# Patient Record
Sex: Male | Born: 1940
Health system: Southern US, Community
[De-identification: ages and names within clinical notes are randomized; demographics above are authoritative.]

## PROBLEM LIST (undated history)

## (undated) DIAGNOSIS — I10 Essential (primary) hypertension: Secondary | ICD-10-CM

## (undated) DIAGNOSIS — I5022 Chronic systolic (congestive) heart failure: Secondary | ICD-10-CM

## (undated) DIAGNOSIS — I471 Supraventricular tachycardia, unspecified: Secondary | ICD-10-CM

## (undated) DIAGNOSIS — D693 Immune thrombocytopenic purpura: Secondary | ICD-10-CM

## (undated) DIAGNOSIS — I4719 Other supraventricular tachycardia: Secondary | ICD-10-CM

## (undated) DIAGNOSIS — K449 Diaphragmatic hernia without obstruction or gangrene: Secondary | ICD-10-CM

## (undated) DIAGNOSIS — I351 Nonrheumatic aortic (valve) insufficiency: Secondary | ICD-10-CM

## (undated) DIAGNOSIS — I493 Ventricular premature depolarization: Secondary | ICD-10-CM

## (undated) DIAGNOSIS — K5730 Diverticulosis of large intestine without perforation or abscess without bleeding: Secondary | ICD-10-CM

## (undated) DIAGNOSIS — I491 Atrial premature depolarization: Secondary | ICD-10-CM

## (undated) DIAGNOSIS — I4729 Other ventricular tachycardia: Secondary | ICD-10-CM

## (undated) DIAGNOSIS — I2510 Atherosclerotic heart disease of native coronary artery without angina pectoris: Secondary | ICD-10-CM

## (undated) DIAGNOSIS — I340 Nonrheumatic mitral (valve) insufficiency: Secondary | ICD-10-CM

## (undated) HISTORY — PX: CORONARY ARTERY BYPASS GRAFT: SHX141

## (undated) HISTORY — PX: CORONARY STENT PLACEMENT: SHX1402

## (undated) HISTORY — PX: CORONARY ANGIOPLASTY: SHX604

## (undated) HISTORY — PX: HERNIA REPAIR: SHX51

## (undated) HISTORY — DX: Atherosclerotic heart disease of native coronary artery without angina pectoris: I25.10

## (undated) HISTORY — DX: Diaphragmatic hernia without obstruction or gangrene: K44.9

## (undated) HISTORY — PX: SAPHENOUS VEIN GRAFT RESECTION: SHX2374

## (undated) HISTORY — PX: CORONARY ANGIOPLASTY WITH STENT PLACEMENT: SHX49

## (undated) HISTORY — DX: Immune thrombocytopenic purpura: D69.3

## (undated) HISTORY — PX: CARDIAC CATHETERIZATION: SHX172

## (undated) HISTORY — DX: Diverticulosis of large intestine without perforation or abscess without bleeding: K57.30

## (undated) HISTORY — DX: Essential (primary) hypertension: I10

---

## 2002-03-22 ENCOUNTER — Ambulatory Visit (HOSPITAL_COMMUNITY): Admission: RE | Admit: 2002-03-22 | Discharge: 2002-03-22 | Payer: Self-pay | Admitting: Cardiovascular Disease

## 2002-04-03 ENCOUNTER — Encounter: Payer: Self-pay | Admitting: Surgery

## 2002-04-05 ENCOUNTER — Inpatient Hospital Stay (HOSPITAL_COMMUNITY): Admission: RE | Admit: 2002-04-05 | Discharge: 2002-04-10 | Payer: Self-pay | Admitting: Surgery

## 2002-04-05 ENCOUNTER — Encounter: Payer: Self-pay | Admitting: Surgery

## 2002-04-06 ENCOUNTER — Encounter: Payer: Self-pay | Admitting: Surgery

## 2002-04-07 ENCOUNTER — Encounter: Payer: Self-pay | Admitting: Surgery

## 2006-06-02 ENCOUNTER — Inpatient Hospital Stay (HOSPITAL_COMMUNITY): Admission: EM | Admit: 2006-06-02 | Discharge: 2006-06-07 | Payer: Self-pay | Admitting: Cardiovascular Disease

## 2006-06-02 ENCOUNTER — Encounter: Payer: Self-pay | Admitting: Cardiovascular Disease

## 2007-02-08 HISTORY — PX: US ECHOCARDIOGRAPHY: HXRAD669

## 2007-11-30 ENCOUNTER — Inpatient Hospital Stay (HOSPITAL_BASED_OUTPATIENT_CLINIC_OR_DEPARTMENT_OTHER): Admission: RE | Admit: 2007-11-30 | Discharge: 2007-11-30 | Payer: Self-pay | Admitting: Cardiovascular Disease

## 2009-07-13 DIAGNOSIS — K449 Diaphragmatic hernia without obstruction or gangrene: Secondary | ICD-10-CM

## 2009-07-13 DIAGNOSIS — K5730 Diverticulosis of large intestine without perforation or abscess without bleeding: Secondary | ICD-10-CM

## 2009-07-13 HISTORY — DX: Diverticulosis of large intestine without perforation or abscess without bleeding: K57.30

## 2009-07-13 HISTORY — DX: Diaphragmatic hernia without obstruction or gangrene: K44.9

## 2009-12-27 ENCOUNTER — Encounter: Payer: Self-pay | Admitting: Internal Medicine

## 2010-01-03 ENCOUNTER — Encounter: Payer: Self-pay | Admitting: Internal Medicine

## 2010-01-06 ENCOUNTER — Encounter (INDEPENDENT_AMBULATORY_CARE_PROVIDER_SITE_OTHER): Payer: Self-pay | Admitting: *Deleted

## 2010-02-12 ENCOUNTER — Ambulatory Visit: Payer: Self-pay | Admitting: Internal Medicine

## 2010-02-12 DIAGNOSIS — I2510 Atherosclerotic heart disease of native coronary artery without angina pectoris: Secondary | ICD-10-CM

## 2010-02-12 DIAGNOSIS — R634 Abnormal weight loss: Secondary | ICD-10-CM | POA: Insufficient documentation

## 2010-02-12 DIAGNOSIS — E1165 Type 2 diabetes mellitus with hyperglycemia: Secondary | ICD-10-CM

## 2010-02-12 DIAGNOSIS — R7401 Elevation of levels of liver transaminase levels: Secondary | ICD-10-CM | POA: Insufficient documentation

## 2010-02-12 DIAGNOSIS — R740 Nonspecific elevation of levels of transaminase and lactic acid dehydrogenase [LDH]: Secondary | ICD-10-CM

## 2010-02-12 DIAGNOSIS — E118 Type 2 diabetes mellitus with unspecified complications: Secondary | ICD-10-CM

## 2010-02-12 DIAGNOSIS — I25118 Atherosclerotic heart disease of native coronary artery with other forms of angina pectoris: Secondary | ICD-10-CM | POA: Insufficient documentation

## 2010-02-12 HISTORY — DX: Atherosclerotic heart disease of native coronary artery without angina pectoris: I25.10

## 2010-02-12 HISTORY — DX: Elevation of levels of liver transaminase levels: R74.01

## 2010-02-12 LAB — CONVERTED CEMR LAB
A-1 Antitrypsin, Ser: 170 mg/dL (ref 83–200)
Anti Nuclear Antibody(ANA): NEGATIVE
Ceruloplasmin: 33 mg/dL (ref 21–63)
HCV Ab: NEGATIVE
Hep B S Ab: NEGATIVE
Hepatitis B Surface Ag: NEGATIVE

## 2010-02-14 DIAGNOSIS — D61818 Other pancytopenia: Secondary | ICD-10-CM

## 2010-02-14 LAB — CONVERTED CEMR LAB
ALT: 62 units/L — ABNORMAL HIGH (ref 0–53)
AST: 68 units/L — ABNORMAL HIGH (ref 0–37)
Albumin: 2.8 g/dL — ABNORMAL LOW (ref 3.5–5.2)
Alkaline Phosphatase: 193 units/L — ABNORMAL HIGH (ref 39–117)
BUN: 12 mg/dL (ref 6–23)
Basophils Absolute: 0 10*3/uL (ref 0.0–0.1)
Basophils Relative: 0.4 % (ref 0.0–3.0)
Bilirubin Urine: NEGATIVE
Bilirubin, Direct: 0.5 mg/dL — ABNORMAL HIGH (ref 0.0–0.3)
CO2: 29 meq/L (ref 19–32)
Calcium: 8.3 mg/dL — ABNORMAL LOW (ref 8.4–10.5)
Chloride: 102 meq/L (ref 96–112)
Creatinine, Ser: 0.6 mg/dL (ref 0.4–1.5)
Eosinophils Absolute: 0.1 10*3/uL (ref 0.0–0.7)
Eosinophils Relative: 2.5 % (ref 0.0–5.0)
Ferritin: 114.1 ng/mL (ref 22.0–322.0)
Folate: 13.6 ng/mL
GFR calc non Af Amer: 149.62 mL/min (ref >60)
Glucose, Bld: 135 mg/dL — ABNORMAL HIGH (ref 70–99)
HCT: 36.1 % — ABNORMAL LOW (ref 39.0–52.0)
Hemoglobin, Urine: NEGATIVE
Hemoglobin: 12.8 g/dL — ABNORMAL LOW (ref 13.0–17.0)
INR: 1.5 — ABNORMAL HIGH (ref 0.8–1.0)
Iron: 167 ug/dL — ABNORMAL HIGH (ref 42–165)
Ketones, ur: NEGATIVE mg/dL
Leukocytes, UA: NEGATIVE
Lymphocytes Relative: 28.1 % (ref 12.0–46.0)
Lymphs Abs: 1.2 10*3/uL (ref 0.7–4.0)
MCHC: 35.6 g/dL (ref 30.0–36.0)
MCV: 109.9 fL — ABNORMAL HIGH (ref 78.0–100.0)
Monocytes Absolute: 0.4 10*3/uL (ref 0.1–1.0)
Monocytes Relative: 9 % (ref 3.0–12.0)
Neutro Abs: 2.6 10*3/uL (ref 1.4–7.7)
Neutrophils Relative %: 60 % (ref 43.0–77.0)
Nitrite: NEGATIVE
Platelets: 99 10*3/uL — ABNORMAL LOW (ref 150.0–400.0)
Potassium: 3.6 meq/L (ref 3.5–5.1)
Prothrombin Time: 16.3 s — ABNORMAL HIGH (ref 9.7–11.8)
RBC: 3.28 M/uL — ABNORMAL LOW (ref 4.22–5.81)
RDW: 15.1 % — ABNORMAL HIGH (ref 11.5–14.6)
Saturation Ratios: 78.3 % — ABNORMAL HIGH (ref 20.0–50.0)
Sodium: 138 meq/L (ref 135–145)
Specific Gravity, Urine: 1.015 (ref 1.000–1.030)
TSH: 1.5 microintl units/mL (ref 0.35–5.50)
Total Bilirubin: 1.9 mg/dL — ABNORMAL HIGH (ref 0.3–1.2)
Total CK: 105 units/L (ref 7–232)
Total Protein, Urine: NEGATIVE mg/dL
Total Protein: 5.6 g/dL — ABNORMAL LOW (ref 6.0–8.3)
Transferrin: 152.3 mg/dL — ABNORMAL LOW (ref 212.0–360.0)
Urine Glucose: 250 mg/dL
Urobilinogen, UA: 1 (ref 0.0–1.0)
Vitamin B-12: 715 pg/mL (ref 211–911)
WBC: 4.3 10*3/uL — ABNORMAL LOW (ref 4.5–10.5)
pH: 5.5 (ref 5.0–8.0)

## 2010-02-17 ENCOUNTER — Encounter: Payer: Self-pay | Admitting: Internal Medicine

## 2010-02-19 ENCOUNTER — Ambulatory Visit: Payer: Self-pay | Admitting: Internal Medicine

## 2010-02-25 ENCOUNTER — Encounter: Payer: Self-pay | Admitting: Internal Medicine

## 2010-02-25 LAB — CBC WITH DIFFERENTIAL/PLATELET
BASO%: 0.4 % (ref 0.0–2.0)
Basophils Absolute: 0 10*3/uL (ref 0.0–0.1)
EOS%: 2.6 % (ref 0.0–7.0)
MCH: 37.2 pg — ABNORMAL HIGH (ref 27.2–33.4)
MCHC: 35.3 g/dL (ref 32.0–36.0)
MCV: 105.4 fL — ABNORMAL HIGH (ref 79.3–98.0)
MONO%: 6.6 % (ref 0.0–14.0)
NEUT%: 57.5 % (ref 39.0–75.0)
RDW: 12.8 % (ref 11.0–14.6)
lymph#: 1.5 10*3/uL (ref 0.9–3.3)

## 2010-02-28 LAB — PROTEIN ELECTROPHORESIS, SERUM
Alpha-2-Globulin: 7.9 % (ref 7.1–11.8)
Beta 2: 7.2 % — ABNORMAL HIGH (ref 3.2–6.5)
Beta Globulin: 5.6 % (ref 4.7–7.2)
Gamma Globulin: 23.4 % — ABNORMAL HIGH (ref 11.1–18.8)

## 2010-02-28 LAB — COMPREHENSIVE METABOLIC PANEL
AST: 61 U/L — ABNORMAL HIGH (ref 0–37)
Albumin: 3.1 g/dL — ABNORMAL LOW (ref 3.5–5.2)
Alkaline Phosphatase: 189 U/L — ABNORMAL HIGH (ref 39–117)
BUN: 14 mg/dL (ref 6–23)
Glucose, Bld: 113 mg/dL — ABNORMAL HIGH (ref 70–99)
Potassium: 4.4 mEq/L (ref 3.5–5.3)
Sodium: 138 mEq/L (ref 135–145)
Total Bilirubin: 1.5 mg/dL — ABNORMAL HIGH (ref 0.3–1.2)

## 2010-02-28 LAB — IRON AND TIBC
%SAT: 52 % (ref 20–55)
Iron: 119 ug/dL (ref 42–165)
UIBC: 109 ug/dL

## 2010-02-28 LAB — FERRITIN: Ferritin: 190 ng/mL (ref 22–322)

## 2010-02-28 LAB — FOLATE: Folate: >20 ng/mL

## 2010-03-05 ENCOUNTER — Ambulatory Visit: Payer: Self-pay | Admitting: Cardiovascular Disease

## 2010-03-24 ENCOUNTER — Telehealth: Payer: Self-pay | Admitting: Internal Medicine

## 2010-03-25 ENCOUNTER — Ambulatory Visit: Payer: Self-pay | Admitting: Internal Medicine

## 2010-03-25 DIAGNOSIS — K449 Diaphragmatic hernia without obstruction or gangrene: Secondary | ICD-10-CM | POA: Insufficient documentation

## 2010-03-26 LAB — CONVERTED CEMR LAB: UREASE: NEGATIVE

## 2010-03-27 ENCOUNTER — Ambulatory Visit: Payer: Self-pay | Admitting: Internal Medicine

## 2010-04-01 ENCOUNTER — Encounter: Payer: Self-pay | Admitting: Internal Medicine

## 2010-04-01 LAB — LACTATE DEHYDROGENASE: LDH: 201 U/L (ref 94–250)

## 2010-04-01 LAB — COMPREHENSIVE METABOLIC PANEL
ALT: 41 U/L (ref 0–53)
BUN: 12 mg/dL (ref 6–23)
CO2: 28 mEq/L (ref 19–32)
Calcium: 8 mg/dL — ABNORMAL LOW (ref 8.4–10.5)
Chloride: 106 mEq/L (ref 96–112)
Creatinine, Ser: 0.9 mg/dL (ref 0.40–1.50)

## 2010-04-01 LAB — CBC WITH DIFFERENTIAL/PLATELET
BASO%: 0.4 % (ref 0.0–2.0)
Basophils Absolute: 0 10*3/uL (ref 0.0–0.1)
HCT: 35.7 % — ABNORMAL LOW (ref 38.4–49.9)
HGB: 12.8 g/dL — ABNORMAL LOW (ref 13.0–17.1)
MONO#: 0.2 10*3/uL (ref 0.1–0.9)
NEUT#: 1.7 10*3/uL (ref 1.5–6.5)
NEUT%: 56.1 % (ref 39.0–75.0)
WBC: 3.1 10*3/uL — ABNORMAL LOW (ref 4.0–10.3)
lymph#: 1 10*3/uL (ref 0.9–3.3)

## 2010-04-11 ENCOUNTER — Other Ambulatory Visit: Admission: RE | Admit: 2010-04-11 | Discharge: 2010-04-11 | Payer: Self-pay | Admitting: Internal Medicine

## 2010-04-11 ENCOUNTER — Encounter: Payer: Self-pay | Admitting: Internal Medicine

## 2010-04-21 ENCOUNTER — Encounter: Payer: Self-pay | Admitting: Internal Medicine

## 2010-04-21 LAB — CBC WITH DIFFERENTIAL/PLATELET
Eosinophils Absolute: 0.1 10*3/uL (ref 0.0–0.5)
MONO#: 0.3 10*3/uL (ref 0.1–0.9)
NEUT#: 1.9 10*3/uL (ref 1.5–6.5)
Platelets: 64 10*3/uL — ABNORMAL LOW (ref 140–400)
RBC: 3.57 10*6/uL — ABNORMAL LOW (ref 4.20–5.82)
RDW: 11.8 % (ref 11.0–14.6)
WBC: 3.4 10*3/uL — ABNORMAL LOW (ref 4.0–10.3)

## 2010-04-21 LAB — LACTATE DEHYDROGENASE: LDH: 209 U/L (ref 94–250)

## 2010-05-12 ENCOUNTER — Ambulatory Visit: Payer: Self-pay | Admitting: Internal Medicine

## 2010-05-12 DIAGNOSIS — K5730 Diverticulosis of large intestine without perforation or abscess without bleeding: Secondary | ICD-10-CM | POA: Insufficient documentation

## 2010-05-12 LAB — CONVERTED CEMR LAB
ALT: 36 units/L (ref 0–53)
AST: 42 units/L — ABNORMAL HIGH (ref 0–37)
Albumin: 2.6 g/dL — ABNORMAL LOW (ref 3.5–5.2)
Alkaline Phosphatase: 106 units/L (ref 39–117)
Bilirubin, Direct: 0.2 mg/dL (ref 0.0–0.3)
INR: 1.8 — ABNORMAL HIGH (ref 0.8–1.0)
Prothrombin Time: 19.7 s — ABNORMAL HIGH (ref 9.7–11.8)
Tissue Transglutaminase Ab, IgA: 6.7 units (ref <20)
Total Bilirubin: 1.3 mg/dL — ABNORMAL HIGH (ref 0.3–1.2)
Total Protein: 5.5 g/dL — ABNORMAL LOW (ref 6.0–8.3)

## 2010-05-20 ENCOUNTER — Ambulatory Visit: Payer: Self-pay | Admitting: Internal Medicine

## 2010-05-20 LAB — CBC WITH DIFFERENTIAL/PLATELET
BASO%: 0.5 % (ref 0.0–2.0)
LYMPH%: 34.1 % (ref 14.0–49.0)
MCHC: 35.5 g/dL (ref 32.0–36.0)
MONO#: 0.4 10*3/uL (ref 0.1–0.9)
MONO%: 9.7 % (ref 0.0–14.0)
Platelets: 68 10*3/uL — ABNORMAL LOW (ref 140–400)
RBC: 3.54 10*6/uL — ABNORMAL LOW (ref 4.20–5.82)
RDW: 12.4 % (ref 11.0–14.6)
WBC: 4 10*3/uL (ref 4.0–10.3)

## 2010-06-10 ENCOUNTER — Ambulatory Visit: Payer: Self-pay | Admitting: Internal Medicine

## 2010-06-10 LAB — CONVERTED CEMR LAB
Albumin: 2.9 g/dL — ABNORMAL LOW (ref 3.5–5.2)
Alkaline Phosphatase: 102 units/L (ref 39–117)
Basophils Absolute: 0 10*3/uL (ref 0.0–0.1)
Eosinophils Absolute: 0.2 10*3/uL (ref 0.0–0.7)
Hemoglobin: 13.9 g/dL (ref 13.0–17.0)
Lymphocytes Relative: 33.8 % (ref 12.0–46.0)
MCHC: 35.3 g/dL (ref 30.0–36.0)
Neutro Abs: 1.7 10*3/uL (ref 1.4–7.7)
Platelets: 65 10*3/uL — ABNORMAL LOW (ref 150.0–400.0)
Prothrombin Time: 17.1 s — ABNORMAL HIGH (ref 9.7–11.8)
RDW: 13.1 % (ref 11.5–14.6)

## 2010-06-17 LAB — CBC WITH DIFFERENTIAL/PLATELET
EOS%: 4.3 % (ref 0.0–7.0)
Eosinophils Absolute: 0.2 10*3/uL (ref 0.0–0.5)
LYMPH%: 36.5 % (ref 14.0–49.0)
MCH: 34.8 pg — ABNORMAL HIGH (ref 27.2–33.4)
MCHC: 34.4 g/dL (ref 32.0–36.0)
MCV: 101.1 fL — ABNORMAL HIGH (ref 79.3–98.0)
MONO%: 8.2 % (ref 0.0–14.0)
NEUT#: 1.9 10*3/uL (ref 1.5–6.5)
Platelets: 69 10*3/uL — ABNORMAL LOW (ref 140–400)
RBC: 3.67 10*6/uL — ABNORMAL LOW (ref 4.20–5.82)
RDW: 13.4 % (ref 11.0–14.6)

## 2010-07-08 ENCOUNTER — Ambulatory Visit: Payer: Self-pay | Admitting: Internal Medicine

## 2010-07-08 DIAGNOSIS — I110 Hypertensive heart disease with heart failure: Secondary | ICD-10-CM | POA: Insufficient documentation

## 2010-07-08 DIAGNOSIS — I10 Essential (primary) hypertension: Secondary | ICD-10-CM | POA: Insufficient documentation

## 2010-07-18 ENCOUNTER — Ambulatory Visit: Payer: Self-pay | Admitting: Internal Medicine

## 2010-07-22 ENCOUNTER — Encounter: Payer: Self-pay | Admitting: Internal Medicine

## 2010-07-22 LAB — CBC WITH DIFFERENTIAL/PLATELET
BASO%: 0.3 % (ref 0.0–2.0)
Basophils Absolute: 0 10*3/uL (ref 0.0–0.1)
EOS%: 5.3 % (ref 0.0–7.0)
Eosinophils Absolute: 0.2 10*3/uL (ref 0.0–0.5)
HCT: 37.1 % — ABNORMAL LOW (ref 38.4–49.9)
HGB: 13.1 g/dL (ref 13.0–17.1)
LYMPH%: 23.4 % (ref 14.0–49.0)
MCH: 35.5 pg — ABNORMAL HIGH (ref 27.2–33.4)
MCHC: 35.4 g/dL (ref 32.0–36.0)
MCV: 100.3 fL — ABNORMAL HIGH (ref 79.3–98.0)
MONO#: 0.5 10*3/uL (ref 0.1–0.9)
MONO%: 9.8 % (ref 0.0–14.0)
NEUT#: 2.9 10*3/uL (ref 1.5–6.5)
NEUT%: 61.2 % (ref 39.0–75.0)
Platelets: 79 10*3/uL — ABNORMAL LOW (ref 140–400)
RBC: 3.7 10*6/uL — ABNORMAL LOW (ref 4.20–5.82)
RDW: 13.1 % (ref 11.0–14.6)
WBC: 4.7 10*3/uL (ref 4.0–10.3)
lymph#: 1.1 10*3/uL (ref 0.9–3.3)

## 2010-07-22 LAB — LACTATE DEHYDROGENASE: LDH: 236 U/L (ref 94–250)

## 2010-08-12 NOTE — Assessment & Plan Note (Signed)
Summary: WEIGHT LOSS , colonoscopy (on Plavix)   History of Present Illness Visit Type: Initial Consult Primary GI MD: Yancey Flemings MD Primary Provider: Brent Bulla, MD Requesting Provider: Brent Bulla, MD Chief Complaint: Patient here to discuss having a colonosocpy, patient is on PLavix on complaint is weight loss of 35 lbs in 3 months.  History of Present Illness:   70 year old white male with diabetes mellitus, hyperlipidemia, and coronary artery disease. He is sent today regarding colonoscopy. Patient is on Plavix because of a history of coronary artery stents placed in 2007. He also takes baby aspirin. He is accompanied by his wife. The patient tells me that he has had 35 pound weight loss in 3 months. He has been feeling tired and weak. Review of outside records shows that the patient underwent a CT scan on December 27, 2009. The examination was unremarkable. I reviewed this personally with Dr. Pecolia Ades.. Also, laboratories from his primary care physician were forwarded. He has abnormal liver tests with SGOT 74, SGPT 117, alkaline phosphatase 326, and total bilirubin 1.8. Albumin is low at 2.9. Protein 6.0. The patient denies a personal or family history of liver disease. He denies alcohol use. No family history of gastrointestinal malignancy. He has not had prior GI evaluations were screening colonoscopy. The GI review of systems is negative except for weight loss. He states that his appetite is unchanged.   GI Review of Systems    Reports weight loss.   Weight loss of 35 pounds over 2 months.   Denies abdominal pain, acid reflux, belching, bloating, chest pain, dysphagia with liquids, dysphagia with solids, heartburn, loss of appetite, nausea, vomiting, vomiting blood, and  weight gain.        Denies anal fissure, black tarry stools, change in bowel habit, constipation, diarrhea, diverticulosis, fecal incontinence, heme positive stool, hemorrhoids, irritable bowel syndrome, jaundice,  light color stool, liver problems, rectal bleeding, and  rectal pain. Preventive Screening-Counseling & Management  Alcohol-Tobacco     Smoking Status: never      Drug Use:  no.      Current Medications (verified): 1)  Carvedilol 3.125 Mg Tabs (Carvedilol) .... Take One By Mouth Two Times A Day 2)  Plavix 75 Mg Tabs (Clopidogrel Bisulfate) .... Take One By Mouth Once Daily 3)  Isosorbide Mononitrate Cr 60 Mg Xr24h-Tab (Isosorbide Mononitrate) .... Take One By Mouth Once Daily 4)  Sea-Omega 30 1200 Mg Caps (Omega-3 Fatty Acids) .... Take Two By Mouth Once Daily 5)  Niacin 500 Mg Tabs (Niacin) .... Take Three By Mouth Once Daily 6)  Multivitamins  Tabs (Multiple Vitamin) .... Take One By Mouth Once Daily 7)  Aspirin 81 Mg Tbec (Aspirin) .... Take One By Mouth Once Daily 8)  Flomax 0.4 Mg Caps (Tamsulosin Hcl) .... Take One By Mouth Once Daily 9)  Glucophage 500 Mg Tabs (Metformin Hcl) .... Take One By Mouth Two Times A Day  Allergies (verified): No Known Drug Allergies  Past History:  Past Medical History: Diabetes Coronary Artery Disease Hyperlipidemia  Past Surgical History: hernia bypass angioplasty/stent  Family History: No FH of Colon Cancer:  Social History: Occupation: retired married with two daughter and one son Patient has never smoked.  Alcohol Use - no Daily Caffeine Use Illicit Drug Use - no Smoking Status:  never Drug Use:  no  Review of Systems       The patient complains of arthritis/joint pain, back pain, change in vision, swelling of feet/legs, thirst - excessive, and urination -  excessive.  The patient denies allergy/sinus, anemia, anxiety-new, blood in urine, breast changes/lumps, confusion, cough, coughing up blood, depression-new, fainting, fatigue, fever, headaches-new, hearing problems, heart murmur, heart rhythm changes, itching, menstrual pain, muscle pains/cramps, night sweats, nosebleeds, pregnancy symptoms, shortness of breath, skin rash,  sleeping problems, sore throat, swollen lymph glands, thirst - excessive , urination - excessive , urination changes/pain, urine leakage, vision changes, and voice change.    Vital Signs:  Patient profile:   70 year old male Height:      70 inches Weight:      156.8 pounds BMI:     22.58 Pulse rate:   68 / minute Pulse rhythm:   regular BP sitting:   88 / 52  (left arm) Cuff size:   regular  Vitals Entered By: Harlow Mares CMA Duncan Dull) (February 12, 2010 9:56 AM)  Physical Exam  General:  thin somewhat unhealthy appearing. no acute distress. Head:  Normocephalic and atraumatic.. Some bilateral temporal wasting Eyes:  PERRLA, no icterus. Nose:  No deformity, discharge,  or lesions. Mouth:  No deformity or lesions Neck:  Supple; no masses or thyromegaly. Lungs:  Clear throughout to auscultation. Heart:  Regular rate and rhythm; no murmurs, rubs,  or bruits. Abdomen:  Soft, nontender and nondistended. No masses, hepatosplenomegaly or hernias noted. Normal bowel sounds. Rectal:  deferred until colonoscopy Msk:  Symmetrical with no gross deformities. Normal posture. Pulses:  Normal pulses noted. Extremities:  No clubbing, cyanosis, edema or deformities noted. Neurologic:  Alert and  oriented x4;  grossly normal neurologically. Skin:  Intact without significant lesions or rashes. Psych:  Alert and cooperative. Normal mood and affect.   Impression & Recommendations:  Problem # 1:  WEIGHT LOSS-ABNORMAL (ICD-783.21) The patient presents with significant weight loss of unknown cause. Unremarkable CT scan. Abnormal liver function tests and hypoalbuminemia. Rule out occult malignancy. Rule out occult liver disease.  Plan: #1. CBC, comprehensive metabolic panel, TSH, urinalysis #2. Viral and nonviral studies to evaluate persistently abnormal liver tests #3. Colonoscopy and upper endoscopy with biopsies. The nature of the procedures as well as the risks, benefits, and alternatives have been  reviewed. He understood and agreed to proceed. The patient is HIGH RISK given his comorbidities and the need to address Plavix therapy. #4. Consult with the patient's cardiologist, Dr. Elease Hashimoto, to see if Plavix can be safely held before his procedures. A letter has been sent. #5. Hold diabetic medications the day of the procedure while the patient is n.p.o.  Problem # 2:  DIABETES MELLITUS-TYPE II (ICD-250.00) hold diabetic medications the day of the procedure. Monitor blood sugar  before and after the procedure  Problem # 3:  CAD (ICD-414.00) consult the patient's cardiologist regarding the feasibility of holding Plavix. He would continue on aspirin however.  Problem # 4:  SCREENING, COLON CANCER (ICD-V76.51) appropriate candidate without contraindication. The nature of the procedure as well as the risks, benefits, and alternatives were reviewed. He understood and agreed to proceed. Movi prep prescribed. The patient instructed on its use  Problem # 5:  TRANSAMINASES, SERUM, ELEVATED (ICD-790.4) etiology of abnormal liver tests unclear. CT Scan does not suggest cirrhosis or portal hypertension. Additional laboratory workup is planned.  Other Orders: Colon/Endo (Colon/Endo) TLB-CBC Platelet - w/Differential (85025-CBCD) TLB-BMP (Basic Metabolic Panel-BMET) (80048-METABOL) TLB-Hepatic/Liver Function Pnl (80076-HEPATIC) TLB-TSH (Thyroid Stimulating Hormone) (84443-TSH) TLB-CK Total Only(Creatine Kinase/CPK) (82550-CK) TLB-Ferritin (82728-FER) TLB-B12, Serum-Total ONLY (78295-A21) TLB-Folic Acid (Folate) (82746-FOL) TLB-Iron, (Fe) Total (83540-FE) TLB-IBC Pnl (Iron/FE;Transferrin) (83550-IBC) TLB-PT (Protime) (85610-PTP) TLB-Udip  w/ Micro (81001-URINE) T-Hepatitis B Surface Antibody (56213-08657) T-Hepatitis B Surface Antigen [phone]) T-Hepatitis C Anti HCV (41324) T-Alpha-1-Antitrypsin Tot (40102-72536) T-AMA (64403-47425) T-ANA [phone]) T-Anti SMA  (32951-88416) T-Ceruloplasmin (60630-16010)  Patient Instructions: 1)  Colon/Endo [facility] 03/25/10 1:30 pm arrive at 12:30 pm 2)  movi prep instructions given to patient. 3)  Movi prep Rx. sent to pharmacy. 4)  Colonoscopy and Flexible Sigmoidoscopy brochure given.  5)  Upper Endoscopy brochure given.  6)  Labs ordered for you to go to basement floor and have drawn today. 7)  Hold Glucophage morning of procedure. 8)  Hold Plavix 5 days prior   Letter will be sent to Dr. Elease Hashimoto. 9)  We will contact you regarding this.  If you have not heard from [place] within 1 week prior to procedure, please call our office. 10)  Copy sent to : Brent Bulla, MD 11)  The medication list was reviewed and reconciled.  All changed / newly prescribed medications were explained.  A complete medication list was provided to the patient / caregiver. Prescriptions: MOVIPREP 100 GM  SOLR (PEG-KCL-NACL-NASULF-NA ASC-C) As per prep instructions.  #1 x 0   Entered by:   Milford Cage NCMA   Authorized by:   Hilarie Fredrickson MD   Signed by:   Milford Cage NCMA on 02/12/2010   Method used:   Electronically to        [facility]  [address] [phone]* (retail)       [address]       [place], [place]  [address]       Ph: [phone] or [phone]       Fax: [phone]   RxID:   [id number]

## 2010-08-12 NOTE — Letter (Signed)
Summary: [facility]  [facility]   Imported By: Sherian Rein 04/25/2010 11:17:50  _____________________________________________________________________  External Attachment:    Type:   Image     Comment:   External Document

## 2010-08-12 NOTE — Procedures (Signed)
Summary: Upper Endoscopy  Patient: [name] Note: All result statuses are Final unless otherwise noted.  Tests: (1) Upper Endoscopy (EGD)   EGD Upper Endoscopy       DONE     [facility]     [address]     [place], [place]  [address]           ENDOSCOPY PROCEDURE REPORT           PATIENT:  Eliav, Mechling  MR#:  [id number]     BIRTHDATE:  11-03-1940, 69 yrs. old  GENDER:  male           ENDOSCOPIST:  Wilhemina Bonito. Eda Keys, MD     Referred by:  Brent Bulla, M.D.           PROCEDURE DATE:  03/25/2010     PROCEDURE:  EGD with biopsy     ASA CLASS:  Class III     INDICATIONS:  weight loss           MEDICATIONS:   There was residual sedation effect present from     prior procedure.     TOPICAL ANESTHETIC:  none           DESCRIPTION OF PROCEDURE:   After the risks benefits and     alternatives of the procedure were thoroughly explained, informed     consent was obtained.  The LB GIF-H180 [id number] endoscope was     introduced through the mouth and advanced to the second portion of     the duodenum, without limitations.  The instrument was slowly     withdrawn as the mucosa was fully examined.     <<PROCEDUREIMAGES>>           The esophagus and gastroesophageal junction were completely normal     in appearance.  A few small erosions were found in the antrum. The     stomach was otherwise normal. CLO bx taken. The duodenal bulb was     normal in appearance, as was the postbulbar duodenum.     Retroflexed views revealed a small hiatal hernia.    The scope was     then withdrawn from the patient and the procedure completed.           COMPLICATIONS:  None           ENDOSCOPIC IMPRESSION:     1) Normal esophagus     2) a few small erosions in the antrum. Otherwise normal stomach           3) Normal duodenum     4) A  small hiatal hernia           RECOMMENDATIONS:     1) Rx CLO if positive     2) Call office next 2-3 days to schedule an office appointment  with Dr. Marina Goodell for 4 weeks (after follow up with Dr. Shirline Frees           ______________________________     Wilhemina Bonito. Eda Keys, MD           CC:  Brent Bulla, MD, The Patient           n.     eSIGNED:   Wilhemina Bonito. Eda Keys at 03/25/2010 02:46 PM           Jinny Sanders, [id number]  Note: An exclamation mark (!) indicates a result that was not dispersed into the flowsheet. Document Creation Date: 03/25/2010 2:46  PM _______________________________________________________________________  (1) Order result status: Final Collection or observation date-time: 03/25/2010 14:37 Requested date-time:  Receipt date-time:  Reported date-time:  Referring Physician:   Ordering Physician: Fransico Setters [phone]) Specimen Source:  Source: Launa Grill Order Number: [id number] Lab site:

## 2010-08-12 NOTE — Procedures (Signed)
Summary: Colonoscopy  Patient: [name] Note: All result statuses are Final unless otherwise noted.  Tests: (1) Colonoscopy (COL)   COL Colonoscopy           DONE     [facility]     [address]     [place], [place]  [address]           COLONOSCOPY PROCEDURE REPORT           PATIENT:  [name], [name]  MR#:  [id number]     BIRTHDATE:  1940-09-14, 69 yrs. old  GENDER:  male     ENDOSCOPIST:  Wilhemina Bonito. Eda Keys, MD     REF. BY:  Brent Bulla, M.D.     PROCEDURE DATE:  03/25/2010     PROCEDURE:  Average-risk screening colonoscopy     G0121     ASA CLASS:  Class III     INDICATIONS:  Routine Risk Screening, weight loss     MEDICATIONS:   Fentanyl 100 mcg IV, Versed 9 mg IV, Benadryl 25 mg     IV           DESCRIPTION OF PROCEDURE:   After the risks benefits and     alternatives of the procedure were thoroughly explained, informed     consent was obtained.  Digital rectal exam was performed and     revealed no abnormalities.   The LB CF-[id number] endoscope     was introduced through the anus and advanced to the cecum, which     was identified by both the appendix and ileocecal valve, without     limitations.Time = 3:72min.The quality of the prep was excellent,     using MoviPrep.  The instrument was then slowly withdrawn ( 12:46     min) as the colon was fully examined.     <<PROCEDUREIMAGES>>           FINDINGS:  The terminal ileum appeared normal.  Severe     diverticulosis was found in the left colon.  This was otherwise a     normal examination of the colon.   Retroflexed views in the rectum     revealed no abnormalities.    The scope was then withdrawn from     the patient and the procedure completed.           COMPLICATIONS:  None     ENDOSCOPIC IMPRESSION:     1) Normal terminal ileum     2) Severe diverticulosis in the left colon     3) Otherwise normal examination of the colon           RECOMMENDATIONS:     1) Return to the care of your  primary provider.     2) Upper endoscopy today           ______________________________     Wilhemina Bonito. Eda Keys, MD           CC:  Brent Bulla, MD; The Patient           n.     eSIGNED:   Wilhemina Bonito. Eda Keys at 03/25/2010 02:32 PM           Jinny Sanders, [id number]  Note: An exclamation mark (!) indicates a result that was not dispersed into the flowsheet. Document Creation Date: 03/25/2010 2:33 PM _______________________________________________________________________  (1) Order result status: Final Collection or observation date-time: 03/25/2010 14:18 Requested date-time:  Receipt date-time:  Reported date-time:  Referring Physician:   Ordering Physician: Fransico Setters [phone]) Specimen Source:  Source: Launa Grill Order Number: [id number] Lab site:

## 2010-08-12 NOTE — Miscellaneous (Signed)
Summary: Clotest  Clinical Lists Changes  Orders: Added new Test order of TLB-H Pylori Screen Gastric Biopsy (83013-CLOTEST) - Signed 

## 2010-08-12 NOTE — Miscellaneous (Signed)
Summary: clotest  Clinical Lists Changes  Problems: Added new problem of HIATAL HERNIA WITH REFLUX (ICD-553.3) Orders: Added new Test order of TLB-H. Pylori Abs(Helicobacter Pylori) (86677-HELICO) - Signed

## 2010-08-12 NOTE — Letter (Signed)
Summary: Diabetic Instructions  [facility]  [address] [place], [place] [address]   Phone: [phone]  Fax: [phone]    [name] November 10, 1940 MRN: [id number]   X  ORAL DIABETIC MEDICATION INSTRUCTIONS  The day before your procedure:   Take your diabetic pill as you do normally  The day of your procedure:   Do not take your diabetic pill    We will check your blood sugar levels during the admission process and again in Recovery before discharging you home  ________________________________________________________________________  _  _   INSULIN (LONG ACTING) MEDICATION INSTRUCTIONS (Lantus, NPH, 70/30, Humulin, Novolin-N)   The day before your procedure:   Take  your regular evening dose    The day of your procedure:   Do not take your morning dose    _  _   INSULIN (SHORT ACTING) MEDICATION INSTRUCTIONS (Regular, Humulog, Novolog)   The day before your procedure:   Do not take your evening dose   The day of your procedure:   Do not take your morning dose   _  _   INSULIN PUMP MEDICATION INSTRUCTIONS  We will contact the physician managing your diabetic care for written dosage instructions for the day before your procedure and the day of your procedure.  Once we have received the instructions, we will contact you.

## 2010-08-12 NOTE — Letter (Signed)
Summary: Anticoag  Anticoag   Imported By: Lester [place] 02/27/2010 07:16:04  _____________________________________________________________________  External Attachment:    Type:   Image     Comment:   External Document

## 2010-08-12 NOTE — Letter (Signed)
Summary: New Patient letter  [facility]  [address] [place], [place] [address]   Phone: [phone]  Fax: [phone]       01/06/2010 MRN: [id number]  [name] [address] [place], [place]  [address]  Dear [name],  Welcome to the [name].    You are scheduled to see Dr.   Marina Goodell on 02-12-10 at 9:30am on the 3rd floor at [facility], [address]  We ask that you try to arrive at our office 15 minutes prior to your appointment time to allow for check-in.  We would like you to complete the enclosed self-administered evaluation form prior to your visit and bring it with you on the day of your appointment.  We will review it with you.  Also, please bring a complete list of all your medications or, if you prefer, bring the medication bottles and we will list them.  Please bring your insurance card so that we may make a copy of it.  If your insurance requires a referral to see a specialist, please bring your referral form from your primary care physician.  Co-payments are due at the time of your visit and may be paid by cash, check or credit card.     Your office visit will consist of a consult with your physician (includes a physical exam), any laboratory testing he/she may order, scheduling of any necessary diagnostic testing (e.g. x-ray, ultrasound, CT-scan), and scheduling of a procedure (e.g. Endoscopy, Colonoscopy) if required.  Please allow enough time on your schedule to allow for any/all of these possibilities.    If you cannot keep your appointment, please call [phone] to cancel or reschedule prior to your appointment date.  This allows [place] the opportunity to schedule an appointment for another patient in need of care.  If you do not cancel or reschedule by 5 p.m. the business day prior to your appointment date, you will be charged a $50.00 late cancellation/no-show fee.    Thank you for choosing  [name] for your medical needs.  We appreciate the opportunity to care for you.  Please visit [place] at our website  to learn more about our practice.                     Sincerely,                                                             [facility]

## 2010-08-12 NOTE — Letter (Signed)
Summary: Anticoagulation Modification Letter  [facility]  [address] [place], [place] [address]   Phone: [phone]  Fax: [phone]    February 12, 2010  Re:    [name] DOB:    07-15-1940 MRN:    [id number]    Dear Kristeen Miss, MD:  We have scheduled the above patient for an Endo/Colon procedure. Our records show that  he is on anticoagulation therapy. Please advise as to how long the patient may come off their therapy of Plavix prior to the scheduled procedure(s) on 03/25/10.   Please fax back/or route the completed form to Milford Cage, CMA at [phone].  Thank you for your help with this matter.  Sincerely,     Wilhemina Bonito. Marina Goodell, MD   Physician Recommendation:  Hold Plavix 5 days prior ________________  Other ______________________________

## 2010-08-12 NOTE — Letter (Signed)
Summary: [facility] Instructions  [facility]  [address] [place], [place] [address]   Phone: [phone]  Fax: [phone]       BRAHM BARBEAU    Oct 18, 1972    MRN: [id number]        Procedure Day /Date:TUESDAY, 03/25/10     Arrival Time:12:30 PM     Procedure Time:1:30 PM     Location of Procedure:                    X  [facility] (4th Floor)                        PREPARATION FOR COLONOSCOPY WITH MOVIPREP/ENDO   Starting 5 days prior to your procedure 9/8/11do not eat nuts, seeds, popcorn, corn, beans, peas,  salads, or any raw vegetables.  Do not take any fiber supplements (e.g. Metamucil, Citrucel, and Benefiber).  THE DAY BEFORE YOUR PROCEDURE         DATE: 03/24/10  DAY: MONDAY  1.  Drink clear liquids the entire day-NO SOLID FOOD  2.  Do not drink anything colored red or purple.  Avoid juices with pulp.  No orange juice.  3.  Drink at least 64 oz. (8 glasses) of fluid/clear liquids during the day to prevent dehydration and help the prep work efficiently.  CLEAR LIQUIDS INCLUDE: Water Jello Ice Popsicles Tea (sugar ok, no milk/cream) Powdered fruit flavored drinks Coffee (sugar ok, no milk/cream) Gatorade Juice: apple, white grape, white cranberry  Lemonade Clear bullion, consomm, broth Carbonated beverages (any kind) Strained chicken noodle soup Hard Candy                             4.  In the morning, mix first dose of MoviPrep solution:    Empty 1 Pouch A and 1 Pouch B into the disposable container    Add lukewarm drinking water to the top line of the container. Mix to dissolve    Refrigerate (mixed solution should be used within 24 hrs)  5.  Begin drinking the prep at 5:00 p.m. The MoviPrep container is divided by 4 marks.   Every 15 minutes drink the solution down to the next mark (approximately 8 oz) until the full liter is complete.   6.  Follow completed prep with 16 oz of clear liquid of your choice (Nothing  red or purple).  Continue to drink clear liquids until bedtime.  7.  Before going to bed, mix second dose of MoviPrep solution:    Empty 1 Pouch A and 1 Pouch B into the disposable container    Add lukewarm drinking water to the top line of the container. Mix to dissolve    Refrigerate  THE DAY OF YOUR PROCEDURE      DATE: 03/25/10 DAY: TUESDAY  Beginning at 8:30 a.m. (5 hours before procedure):         1. Every 15 minutes, drink the solution down to the next mark (approx 8 oz) until the full liter is complete.  2. Follow completed prep with 16 oz. of clear liquid of your choice.    3. You may drink clear liquids until 11:30 AM (2 HOURS BEFORE PROCEDURE).   MEDICATION INSTRUCTIONS  Unless otherwise instructed, you should take regular prescription medications with a small sip of water   as early as possible the morning of your procedure.  Diabetic patients -  see separate instructions.  Stop taking Plavix  on  03/20/10 (5 days before procedure).              OTHER INSTRUCTIONS  You will need a responsible adult at least 72 years of age to accompany you and drive you home.   This person must remain in the waiting room during your procedure.  Wear loose fitting clothing that is easily removed.  Leave jewelry and other valuables at home.  However, you may wish to bring a book to read or  an iPod/MP3 player to listen to music as you wait for your procedure to start.  Remove all body piercing jewelry and leave at home.  Total time from sign-in until discharge is approximately 2-3 hours.  You should go home directly after your procedure and rest.  You can resume normal activities the  day after your procedure.  The day of your procedure you should not:   Drive   Make legal decisions   Operate machinery   Drink alcohol   Return to work  You will receive specific instructions about eating, activities and medications before you leave.    The above instructions  have been reviewed and explained to me by   _______________________    I fully understand and can verbalize these instructions _____________________________ Date _________

## 2010-08-12 NOTE — Letter (Signed)
Summary: [facility]  [facility]   Imported By: Sherian Rein 04/16/2010 14:37:21  _____________________________________________________________________  External Attachment:    Type:   Image     Comment:   External Document

## 2010-08-12 NOTE — Assessment & Plan Note (Signed)
Summary: Followup (post procedures, LFTs)   History of Present Illness Visit Type: Follow-up Visit Primary GI MD: Yancey Flemings MD Primary Provider: Brent Bulla, MD Requesting Provider: Brent Bulla, MD Chief Complaint: Procedure F/U, no problems History of Present Illness:   70 year old with coronary artery disease, diabetes, and hyperlipidemia who was evaluated 2 months ago regarding screening colonoscopy. At that time reports of unexplained weight loss. I also noticed that he had some abnormal liver function tests. We repeat her laboratories at that time and the patient was noted to be pancytopenic. Also abnormal liver function tests, low albumin, and elevated prothrombin time. All of these issues raised the question of liver disease. However, recent CT scan with normal-appearing liver and spleen. No evidence for portal hypertension. We proceed with colonoscopy and upper endoscopy which were remarkable for diverticulosis and trivial antral erosions respectively. Negative H. pylori testing. He was subsequently referred to Dr. Arbutus Ped who performed bone marrow biopsy on September 30. This was unremarkable. Patient was questioned to have ITP. He presents here for followup. Since last visit he has had some weight gain. No GI complaints. He has not had followup laboratories. I should mention that laboratory testing for viral and nonviral etiologies for chronic liver disease returned negative. He is accompanied by his wife.   GI Review of Systems    Reports weight gain.      Denies abdominal pain, acid reflux, belching, bloating, chest pain, dysphagia with liquids, dysphagia with solids, heartburn, loss of appetite, nausea, vomiting, vomiting blood, and  weight loss.      Reports diverticulosis.     Denies anal fissure, black tarry stools, change in bowel habit, constipation, diarrhea, fecal incontinence, heme positive stool, hemorrhoids, irritable bowel syndrome, jaundice, light color stool, liver  problems, rectal bleeding, and  rectal pain.    Current Medications (verified): 1)  Carvedilol 3.125 Mg Tabs (Carvedilol) .... Take One By Mouth Two Times A Day 2)  Plavix 75 Mg Tabs (Clopidogrel Bisulfate) .... Take One By Mouth Once Daily 3)  Isosorbide Mononitrate Cr 60 Mg Xr24h-Tab (Isosorbide Mononitrate) .... Take One By Mouth Once Daily 4)  Sea-Omega 30 1200 Mg Caps (Omega-3 Fatty Acids) .... Take Two By Mouth Once Daily 5)  Niacin 500 Mg Tabs (Niacin) .... Take Three By Mouth Once Daily 6)  Multivitamins  Tabs (Multiple Vitamin) .... Take One By Mouth Once Daily 7)  Aspirin 81 Mg Tbec (Aspirin) .... Take One By Mouth Once Daily 8)  Flomax 0.4 Mg Caps (Tamsulosin Hcl) .... Take One By Mouth Once Daily 9)  Glucophage 500 Mg Tabs (Metformin Hcl) .... Take One By Mouth Two Times A Day  Allergies (verified): No Known Drug Allergies  Past History:  Past Medical History: Diabetes Coronary Artery Disease Hyperlipidemia ITP  Past Surgical History: Reviewed history from 02/12/2010 and no changes required. hernia bypass angioplasty/stent  Family History: Reviewed history from 02/12/2010 and no changes required. No FH of Colon Cancer:  Social History: Reviewed history from 02/12/2010 and no changes required. Occupation: retired married with two daughter and one son Patient has never smoked.  Alcohol Use - no Daily Caffeine Use Illicit Drug Use - no  Review of Systems  The patient denies allergy/sinus, anemia, anxiety-new, arthritis/joint pain, back pain, blood in urine, breast changes/lumps, change in vision, confusion, cough, coughing up blood, depression-new, fainting, fatigue, fever, headaches-new, hearing problems, heart murmur, heart rhythm changes, itching, menstrual pain, muscle pains/cramps, night sweats, nosebleeds, pregnancy symptoms, shortness of breath, skin rash, sleeping problems, sore  throat, swelling of feet/legs, swollen lymph glands, thirst - excessive ,  urination - excessive , urination changes/pain, urine leakage, vision changes, and voice change.    Vital Signs:  Patient profile:   70 year old male Height:      70 inches Weight:      164.13 pounds BMI:     23.64 Pulse rate:   68 / minute Pulse rhythm:   regular BP sitting:   100 / 52  (left arm) Cuff size:   regular  Vitals Entered By: June McMurray CMA Duncan Dull) (May 12, 2010 1:33 PM)  Physical Exam  General:  Well developed, well nourished, no acute distress. Head:  Normocephalic and atraumatic. Eyes:  PERRLA, no icterus. Nose:  No deformity, discharge,  or lesions. Mouth:  No deformity or lesions. Neck:  Supple; no masses or thyromegaly. Lungs:  Clear throughout to auscultation. Heart:  Regular rate and rhythm; no murmurs, rubs,  or bruits. Abdomen:  Soft, nontender and nondistended. No masses, hepatosplenomegaly or hernias noted. Normal bowel sounds. Msk:  Symmetrical with no gross deformities. Normal posture. Pulses:  Normal pulses noted. Extremities:  no edema Neurologic:  alert and oriented Skin:  Intact without significant lesions or rashes. Psych:  Alert and cooperative. Normal mood and affect.   Impression & Recommendations:  Problem # 1:  TRANSAMINASES, SERUM, ELEVATED (ICD-790.4) I am still concerned that the patient may have occult liver disease. Overall, he is asymptomatic and has been some weight. We will repeat laboratories today including liver function studies and prothrombin time. Further recommendations to be made after the results have returned.  Problem # 2:  WEIGHT LOSS-ABNORMAL (ICD-783.21) improved  Problem # 3:  SCREENING, COLON CANCER (ICD-V76.51) up-to-date  Problem # 4:  DIVERTICULOSIS-COLON (ICD-562.10) Assessment: Comment Only  Other Orders: T-Tissue Transglutamase Ab IgA (60737-10626) TLB-Hepatic/Liver Function Pnl (80076-HEPATIC) TLB-PT (Protime) (85610-PTP)  Patient Instructions: 1)  Labs ordered for patient to have drawn  today on basement floor. 2)  Copy sent to : Brent Bulla, MD, Spero Geralds M.D. 3)  The medication list was reviewed and reconciled.  All changed / newly prescribed medications were explained.  A complete medication list was provided to the patient / caregiver.

## 2010-08-12 NOTE — Letter (Signed)
Summary: [facility]  [facility]   Imported By: Sherian Rein 05/12/2010 11:57:13  _____________________________________________________________________  External Attachment:    Type:   Image     Comment:   External Document

## 2010-08-12 NOTE — Consult Note (Signed)
[facility]: [facility]   Imported By: Lennie Odor 03/12/2010 14:46:02  _____________________________________________________________________  External Attachment:    Type:   Image     Comment:   External Document

## 2010-08-12 NOTE — Progress Notes (Signed)
Summary: Triage  Phone Note Call from Patient Call back at Home Phone [phone]   Caller: Spouse Doris Call For: Dr. Marina Goodell Summary of Call: pt. is sch'd from COL tomorrow and forgot to stop his Plavix...last dose was taken on Sat. 03-22-10 Initial call taken by: Karna Christmas,  March 24, 2010 10:53 AM  Follow-up for Phone Call        [facility] Dr Marina Goodell[facility]. Wife informed and reassurred. Follow-up by: Oda Cogan RN,  March 24, 2010 11:08 AM

## 2010-08-14 NOTE — Assessment & Plan Note (Signed)
Summary: Followup-abnormal liver tests and weight loss   History of Present Illness Visit Type: Follow-up Visit Primary GI MD: Yancey Flemings MD Primary Provider: Brent Bulla, MD Requesting Provider: na Chief Complaint: F/u from abnormal liver function tests. Pt c/o fatigue but denies any GI complaints  History of Present Illness:   70 year old with coronary artery disease, diabetes mellitus, and hyperlipidemia who was seen initially this summer for a screening colonoscopy. At that time, he was noticed to have abnormal liver tests. Additionally, pancytopenia. Colonoscopy revealed diverticulosis. Upper endoscopy was unremarkable with a normal esophagus. Hematology workup, including bone marrow biopsy, negative for primary bone marrow problem. Laboratories 4 weeks ago remain abnormal with white blood cell count 3.4, MCV 102.1, and total platelets 65,000. Additionally, SGOT 48, SGPT 41, alkaline phosphatase is 102, total bilirubin 1.3. Protein 5.8 and albumin 2.9. Prothrombin time 17.1 seconds with an INR of 1.6. No improvement in INR after vitamin K. Previous imaging does not show obvious cirrhosis or portal hypertension. Testing for sprue negative. He returns today with his wife. Overall he is feeling well except for some occasional fatigue. His appetite remains good and he has been gaining weight. His diabetes is under good control. No other issues were complaints. Workup for multiple causes of liver disease has been negative.   GI Review of Systems      Denies abdominal pain, acid reflux, belching, bloating, chest pain, dysphagia with liquids, dysphagia with solids, heartburn, loss of appetite, nausea, vomiting, vomiting blood, weight loss, and  weight gain.      Reports liver problems.     Denies anal fissure, black tarry stools, change in bowel habit, constipation, diarrhea, diverticulosis, fecal incontinence, heme positive stool, hemorrhoids, irritable bowel syndrome, jaundice, light color  stool, rectal bleeding, and  rectal pain.    Current Medications (verified): 1)  Carvedilol 3.125 Mg Tabs (Carvedilol) .... Take One By Mouth Two Times A Day 2)  Plavix 75 Mg Tabs (Clopidogrel Bisulfate) .... Take One By Mouth Once Daily 3)  Isosorbide Mononitrate Cr 60 Mg Xr24h-Tab (Isosorbide Mononitrate) .... Take One By Mouth Once Daily 4)  Sea-Omega 30 1200 Mg Caps (Omega-3 Fatty Acids) .... Take Two By Mouth Once Daily 5)  Niacin 500 Mg Tabs (Niacin) .... Take Three By Mouth Once Daily 6)  Multivitamins  Tabs (Multiple Vitamin) .... Take One By Mouth Once Daily 7)  Aspirin 81 Mg Tbec (Aspirin) .... Take One By Mouth Once Daily 8)  Flomax 0.4 Mg Caps (Tamsulosin Hcl) .... Take One By Mouth Once Daily 9)  Glucophage 500 Mg Tabs (Metformin Hcl) .... Take One By Mouth Two Times A Day  Allergies (verified): No Known Drug Allergies  Past History:  Past Medical History: ITP HYPERTENSION (ICD-401.9) DIVERTICULOSIS-COLON (ICD-562.10) HIATAL HERNIA WITH REFLUX (ICD-553.3) PANCYTOPENIA (ICD-284.1) DIABETES MELLITUS-TYPE II (ICD-250.00) WEIGHT LOSS-ABNORMAL (ICD-783.21) SCREENING, COLON CANCER (ICD-V76.51) CAD (ICD-414.00) TRANSAMINASES, SERUM, ELEVATED (ICD-790.4)  Past Surgical History: Reviewed history from 02/12/2010 and no changes required. hernia bypass angioplasty/stent  Family History: Reviewed history from 02/12/2010 and no changes required. No FH of Colon Cancer:  Social History: Reviewed history from 02/12/2010 and no changes required. Occupation: retired married with two daughter and one son Patient has never smoked.  Alcohol Use - no Daily Caffeine Use Illicit Drug Use - no  Review of Systems       The patient complains of arthritis/joint pain, fatigue, and muscle pains/cramps.  The patient denies allergy/sinus, anemia, anxiety-new, back pain, blood in urine, breast changes/lumps, change in vision, confusion,  cough, coughing up blood, depression-new,  fainting, fever, headaches-new, hearing problems, heart murmur, heart rhythm changes, itching, menstrual pain, night sweats, nosebleeds, pregnancy symptoms, shortness of breath, skin rash, sleeping problems, sore throat, swelling of feet/legs, swollen lymph glands, thirst - excessive , urination - excessive , urination changes/pain, urine leakage, vision changes, and voice change.    Vital Signs:  Patient profile:   70 year old male Height:      70 inches Weight:      170 pounds BMI:     24.48 BSA:     1.95 Pulse rate:   88 / minute Pulse rhythm:   regular BP sitting:   118 / 64  (left arm) Cuff size:   regular  Vitals Entered By: Ok Anis CMA (July 08, 2010 2:21 PM)  Physical Exam  General:  Well developed, well nourished, no acute distress. Eyes:  PERRLA, no icterus. Mouth:  No deformity or lesions. Lungs:  Clear throughout to auscultation. Heart:  Regular rate and rhythm; no murmurs, rubs,  or bruits. Abdomen:  Soft, nontender and nondistended. No masses, hepatosplenomegaly or hernias noted. Normal bowel sounds. Pulses:  Normal pulses noted. Extremities:  No clubbing, cyanosis, edema or deformities noted. Neurologic:  Alert and  oriented x4 Skin:  Intact without significant lesions or rashes. Psych:  Alert and cooperative. Normal mood and affect.   Impression & Recommendations:  Problem # 1:  TRANSAMINASES, SERUM, ELEVATED (ICD-64.58) 70 year old with multiple medical problems who has been thoroughly evaluated regarding abnormal liver tests. The patient appears to have occult cryptogenic liver disease with impaired hepatic synthetic function, but no evidence of portal hypertension. I discussed this with the patient and his wife today. We also discussed liver biopsy, but I do not think this would add much currently. I discussed multiple possible complications of liver disease. At this point, he should return to the care of Dr. Ivory Broad. He should return to see me  routinely in about 6 months at which time we will repeat blood work. If he develops any interval clinical problems related to his liver, I would see him sooner.  Problem # 2:  SCREENING, COLON CANCER (ICD-V76.51) up-to-date  Problem # 3:  WEIGHT LOSS-ABNORMAL (ICD-783.21) improving. Increased 6 pounds in 2 months  Patient Instructions: 1)  Please schedule a follow-up appointment in 6 months. 2)  Please continue current medications.  3)  Copy sent to : Brent Bulla, MD;  Janyth Contes M.D. 4)  The medication list was reviewed and reconciled.  All changed / newly prescribed medications were explained.  A complete medication list was provided to the patient / caregiver.

## 2010-08-20 NOTE — Letter (Signed)
Summary: [facility]  [facility]   Imported By: Sherian Rein 08/15/2010 10:48:11  _____________________________________________________________________  External Attachment:    Type:   Image     Comment:   External Document

## 2010-09-09 ENCOUNTER — Ambulatory Visit (INDEPENDENT_AMBULATORY_CARE_PROVIDER_SITE_OTHER): Payer: PRIVATE HEALTH INSURANCE | Admitting: Cardiovascular Disease

## 2010-09-09 ENCOUNTER — Encounter: Payer: Self-pay | Admitting: Internal Medicine

## 2010-09-09 DIAGNOSIS — I2510 Atherosclerotic heart disease of native coronary artery without angina pectoris: Secondary | ICD-10-CM

## 2010-09-09 DIAGNOSIS — Z9861 Coronary angioplasty status: Secondary | ICD-10-CM

## 2010-09-25 LAB — DIFFERENTIAL
Basophils Absolute: 0 10*3/uL (ref 0.0–0.1)
Lymphocytes Relative: 31 % (ref 12–46)
Lymphs Abs: 1.1 10*3/uL (ref 0.7–4.0)
Neutro Abs: 1.8 10*3/uL (ref 1.7–7.7)
Neutrophils Relative %: 54 % (ref 43–77)

## 2010-09-25 LAB — CBC
Hemoglobin: 12.7 g/dL — ABNORMAL LOW (ref 13.0–17.0)
Platelets: 72 10*3/uL — ABNORMAL LOW (ref 150–400)
RBC: 3.48 MIL/uL — ABNORMAL LOW (ref 4.22–5.81)
WBC: 3.4 10*3/uL — ABNORMAL LOW (ref 4.0–10.5)

## 2010-09-25 LAB — CHROMOSOME ANALYSIS, BONE MARROW

## 2010-09-25 LAB — GLUCOSE, CAPILLARY: Glucose-Capillary: 157 mg/dL — ABNORMAL HIGH (ref 70–99)

## 2010-10-15 ENCOUNTER — Other Ambulatory Visit: Payer: Self-pay | Admitting: Internal Medicine

## 2010-10-15 ENCOUNTER — Encounter (HOSPITAL_BASED_OUTPATIENT_CLINIC_OR_DEPARTMENT_OTHER): Payer: PRIVATE HEALTH INSURANCE | Admitting: Internal Medicine

## 2010-10-15 DIAGNOSIS — E119 Type 2 diabetes mellitus without complications: Secondary | ICD-10-CM

## 2010-10-15 DIAGNOSIS — D696 Thrombocytopenia, unspecified: Secondary | ICD-10-CM

## 2010-10-15 DIAGNOSIS — D61818 Other pancytopenia: Secondary | ICD-10-CM

## 2010-10-15 LAB — CBC WITH DIFFERENTIAL/PLATELET
Basophils Absolute: 0 10*3/uL (ref 0.0–0.1)
Eosinophils Absolute: 0.2 10*3/uL (ref 0.0–0.5)
HCT: 37.2 % — ABNORMAL LOW (ref 38.4–49.9)
HGB: 13.1 g/dL (ref 13.0–17.1)
LYMPH%: 34.5 % (ref 14.0–49.0)
MONO#: 0.3 10*3/uL (ref 0.1–0.9)
NEUT#: 2.6 10*3/uL (ref 1.5–6.5)
NEUT%: 55 % (ref 39.0–75.0)
Platelets: 61 10*3/uL — ABNORMAL LOW (ref 140–400)
WBC: 4.7 10*3/uL (ref 4.0–10.3)

## 2010-10-15 LAB — COMPREHENSIVE METABOLIC PANEL
Albumin: 3.1 g/dL — ABNORMAL LOW (ref 3.5–5.2)
BUN: 12 mg/dL (ref 6–23)
CO2: 25 mEq/L (ref 19–32)
Calcium: 8.5 mg/dL (ref 8.4–10.5)
Creatinine, Ser: 0.86 mg/dL (ref 0.40–1.50)
Glucose, Bld: 156 mg/dL — ABNORMAL HIGH (ref 70–99)
Sodium: 135 mEq/L (ref 135–145)
Total Bilirubin: 1.3 mg/dL — ABNORMAL HIGH (ref 0.3–1.2)

## 2010-10-21 ENCOUNTER — Telehealth: Payer: Self-pay | Admitting: Cardiovascular Disease

## 2010-10-21 NOTE — Telephone Encounter (Signed)
Have him cut the tablet in half and take 1/2 each morning. If that doesn't help, stop completely.

## 2010-10-21 NOTE — Telephone Encounter (Signed)
Pt called back and given new instructions per Dr Elease Hashimoto. To take imdur 60mg  tab cut in half daily (30mg  daily) pt verbalized understanding. Alfonso Ramus RN

## 2010-10-21 NOTE — Telephone Encounter (Signed)
CALL PT ABOUT THE ISOSORBIDE MAKING HIM DIZZY.

## 2010-10-21 NOTE — Telephone Encounter (Signed)
Pt C/O imdur making him dizzy. He has taken it for years and not sure why this is happening. Told him I would inform you and see if you want to decrease dose or discontinue med. Pt denies CP and nausea. Asked him to separate med out and take alone in afternoon to see if this med is making him dizzy. Pt taught to stand up slowly to lower risk of fall. Pt verbalizes understanding and will take med separate from others. Pt to contact us by Friday with results or sooner if any other questions or concerns. Alfonso Ramus RN

## 2010-11-25 NOTE — H&P (Signed)
NAME:  DIANNE, BADY NO.:  [id number]   MEDICAL RECORD NO.:  [id number]             PATIENT TYPE:   LOCATION:                                 FACILITY:   PHYSICIAN:  Vesta Mixer, M.D.      DATE OF BIRTH:   DATE OF ADMISSION:  DATE OF DISCHARGE:                              HISTORY & PHYSICAL   HISTORY:  [name] is an elderly gentleman with a history of  coronary artery disease.  He is now admitted to the hospital for heart  catheterization after having some recurrent episodes of chest pain.   [name] has a long history of coronary artery disease.  He is status  post coronary artery bypass grafting many years ago.  He presented  acutely in 2007 with a myocardial infarction.  He was found to have an  occlusion of his left circumflex artery.  We ended-up placing a stent  back into his left main and covering into the circumflex vessel  (protected left main).  He has done fairly well.  He has not had any  further problems recently.   Starting yesterday, he had some episodes of chest pain.  It was  described as a sharp pain and it feels somewhat sore today.  There were  no precipitating factors.  The pain would last for several hours.  He  took a nitroglycerin today with complete relief.  There was some  radiation into his right arm.  He also had some diaphoresis.   He denies any syncope or presyncope.   CURRENT MEDICATIONS:  1. Aspirin 81 mg a day.  2. Multivitamins once a day.  3. Coenzyme Q10 once a day.  4. Plavix 75 mg a day.  5. Carvedilol 3.125 mg twice a day.  6. Vitamin D once a day.  7. Vitamin C once a day.   ALLERGIES:  He is intolerant to CRESTOR, which cause muscle pain.  He is  intolerant to LIPITOR, which cause memory loss, and ZOCOR cause muscle  aches.   PAST MEDICAL HISTORY:  Coronary artery disease.  He is status post  coronary artery bypass grafting.  His heart catheterization in 2007  revealed severe native coronary  artery disease.  The saphenous vein  graft to the right coronary artery was normal.  There was diffuse  disease in the posterior descending artery between 78% and 80%.  The  saphenous vein graft to the first diagonal artery was occluded proximal.  The saphenous vein graft to the obtuse marginal branches was occluded  proximal.  The left internal mammary artery was a large vessel, which  anastomosed in the distal aspect of the LAD.  The left anterior  descending artery was severely and diffusely diseased like a string of  beads.  The IMA anastomosed distal to most of those lesions.   We performed PTCA and stenting of the left main/left circumflex artery  during that catheterization.   We will admit him for heart catheterization for further evaluation of  this chest pain.  We offered to admit him to the hospital today,  but he  declined.  He does not think, it is bad as what he had last time.  If he  develops any further episodes of chest pain, he will take some  nitroglycerin and call us right away.  We have discussed the risks,  benefits, and options of heart catheterization.  He understands and  agrees to proceed.           ______________________________  Vesta Mixer, M.D.     PJN/MEDQ  D:  11/28/2007  T:  11/29/2007  Job:  [id number]   cc:   Lianne Bushy, M.D.

## 2010-11-25 NOTE — Cardiovascular Report (Signed)
NAME:  CHUE, BERKOVICH NO.:  [id number]   MEDICAL RECORD NO.:  [id number]          PATIENT TYPE:  OIB   LOCATION:  1961                         FACILITY:  [facility]   PHYSICIAN:  Vesta Mixer, M.D. DATE OF BIRTH:  1941-01-18   DATE OF PROCEDURE:  11/30/2007  DATE OF DISCHARGE:  11/30/2007                            CARDIAC CATHETERIZATION   [name] is an elderly gentleman with a history of coronary artery  disease.  He is status post coronary artery bypass grafting.  He has  known severe disease including the disease of the saphenous vein grafts.  He had a heart catheterization and angioplasty several years ago.  He  was found to have a patent LIMA to the LAD.  He underwent stenting of  protected left main circumflex artery.  He had a patent saphenous vein  graft to the right coronary artery.  The saphenous vein graft to the  diagonal vessel and the saphenous vein graft to the obtuse marginal  artery were occluded.  He presents now with recurrent episodes of chest  discomfort.   PROCEDURE:  Left heart catheterization with coronary angiography.   The right femoral artery was easily cannulated using modified Seldinger  technique.   HEMODYNAMIC RESULTS:  LV pressure was 112/10 and aortic pressure of  105/53.   Angiography left main:  The left main is fairly unremarkable.  The left  main stent is widely patent.  The left anterior descending artery has  severe disease proximally with a hazy 90% and then was 100% occluded.  The LAD gives off several very tiny diagonal vessels.  There are 2  diagonal vessels that are identified and each one has a 90% stenosis at  its origin.   The left circumflex artery has moderate to severe diffuse disease  ranging between 60% and 80%.  The right coronary artery is occluded.   The saphenous vein graft to the right coronary artery is widely patent.  The posterior descending artery and posterolateral segment artery are  severely diseased.  The stenoses range between 75% and 90% in severity  and the atherosclerosis extends the entire length of the PDA and  posterolateral branch.   The left internal mammary artery is a normal vessel.  Anastomosis to the  LAD is normal.  The distal LAD is unremarkable.   The saphenous vein graft to the first diagonal artery is occluded.  The  saphenous vein graft to the left circumflex marginal branch is occluded.   The left ventriculogram reveals mild left ventricular dysfunction.  Ejection fraction is between 40% and 45%.  There is inferobasilar severe  hypokinesis.  There is mild hypokinesis involving the other walls.   COMPLICATIONS:  None.   CONCLUSION:  Severe coronary artery disease.  He does not have any  lesions that are amenable to angioplasty at this time.  We will continue  with medical therapy.           ______________________________  Vesta Mixer, M.D.     PJN/MEDQ  D:  11/30/2007  T:  11/30/2007  Job:  [id number]   cc:  Lianne Bushy, M.D.

## 2010-11-28 NOTE — Discharge Summary (Signed)
NAME:  [name], [name] NO.:  [id number]   MEDICAL RECORD NO.:  [id number]          PATIENT TYPE:  INP   LOCATION:  2036                         FACILITY:  [facility]   PHYSICIAN:  Vesta Mixer, M.D. DATE OF BIRTH:  05-27-1941   DATE OF ADMISSION:  06/01/2006  DATE OF DISCHARGE:  06/07/2006                                 DISCHARGE SUMMARY   DISCHARGE DIAGNOSES:  1. Acute anterolateral myocardial infarction caused by occlusion of the      left main.  2. Status post PTCA and stenting of the left main using a 3.5-mm Vision      stent.  3. Hyperlipidemia.   DISCHARGE MEDICATIONS:  Aspirin 81 mg a day, Plavix 75 mg a day, Vytorin 10  mg/40 mg once a day, Prilosec over-the-counter 20 mg a day, carvedilol 3.125  mg b.i.d., nitroglycerin as needed, multivitamin as needed.   DISPOSITION:  The patient will see Dr. Elease Hashimoto in approximately one to two  weeks.  We will try to add on an ACE inhibitor as an outpatient as his blood  pressure tolerates.   HISTORY:  Mr. Heffner is a 70 year old gentleman who was admitted to the  hospital on June 01, 2006 after having severe intermittent chest pain  for the previous couple days.  Please see dictated H&P for further details.   HOSPITAL COURSE:  CHEST PAIN:  The patient was taken emergently to the cath  lab.  He was found to have a subtotal occlusion of the left main with a  complete occlusion of the left anterior descending artery.  He had TIMI 1  flow down the left circumflex artery.  He underwent successful PTCA and  stenting of left main using a 3.5-mm Vision stent (non-drug-eluting).  The  patient tolerated the procedure fairly well.  He did require angioplasty  down the circumflex artery because of some severe diffuse disease.  The  patient is noted to have severe diffuse irregularities of all his coronary  arteries.  At the time of heart catheterization, he was noted to have a  complete occlusion of the saphenous vein  graft to the diagonal artery and an  occlusion of the saphenous vein graft to the marginal arteries.  The  patient's __________.   The patient tolerated the procedure quite well.  He was up ambulating in the  next several days we slowly added him some medications.  He is now feeling  fairly well and is not having any episodes of chest pain.  We will discharge  him on the above-noted medications and disposition.  We will increase his  Vytorin slightly given the diffuse and severe nature of his coronary artery  disease.  __________  are stable.           ______________________________  Vesta Mixer, M.D.     PJN/MEDQ  D:  06/07/2006  T:  06/07/2006  Job:  [id number]   cc:   Lianne Bushy, M.D.

## 2010-11-28 NOTE — Op Note (Signed)
NAME:  [name], [name]                        ACCOUNT NO.:  [id number]   MEDICAL RECORD NO.:  [id number]                   PATIENT TYPE:  INP   LOCATION:  2311                                 FACILITY:  [facility]   PHYSICIAN:  Alleen Borne, M.D.               DATE OF BIRTH:  14-Feb-1941   DATE OF PROCEDURE:  04/05/2002  DATE OF DISCHARGE:                                 OPERATIVE REPORT   PREOPERATIVE DIAGNOSES:  Severe left main and three-vessel coronary artery  disease.   POSTOPERATIVE DIAGNOSES:  Severe left main and three-vessel coronary artery  disease.   OPERATION PERFORMED:  Median sternotomy, extracorporeal circulation,  coronary artery bypass grafting surgery times four using a left internal  mammary artery graft to the left anterior descending coronary artery, with a  saphenous vein graft to the diagonal branch of the left anterior descending  coronary artery, a saphenous vein graft to the obtuse marginal branch of the  left circumflex coronary artery, and a saphenous vein graft to the distal  right coronary artery.  Endoscopic vein harvesting from the right thigh.   SURGEON:  Alleen Borne, M.D.   ASSISTANT:  Debby Freiberg. Thomasena Edis, P.A.   ANESTHESIA:  General endotracheal.   INDICATIONS FOR PROCEDURE:  The patient is a 70 year old previously healthy  gentleman who underwent a recent electrocardiogram by his primary physician  showing Q-waves in the inferior leads as well as T-wave inversion in the  inferior leads.  This was a significant change from his electrocardiogram  two years ago and he was referred for cardiological evaluation.  Cardiac  catheterization on March 22, 2002 by  Vesta Mixer, M.D. showed severe diffuse three-vessel coronary artery  disease.  The left main had about 50 to 60% stenosis and was diffusely  diseased.  The LAD was severely and diffusely diseased throughout its  course, the sequential 90% stenosis extending from the mid vessel down  to  the apex.  There were several small diagonal branches, each of which was  severely narrowed.  The left circumflex coronary artery had mild to severe  diffuse disease between 70 and 75% stenosis.  The right coronary artery was  a large dominant vessel that was ectatic.  The mid vessel had two sequential  90% stenoses.  The posterior descending and posterolateral branches were  diffusely diseased.  Left ventricular ejection fraction was 45 to 50% with  akinesis on the inferior base and mild anterior hypokinesis.  There was no  significant mitral regurgitation and no gradient across the aortic valve.  After review of the angiogram and examination of the patient, it was felt  that coronary artery bypass graft surgery was the best treatment to prevent  further ischemia and infarction.  I discussed the operative procedure with  the patient and his wife and daughter including alternatives, benefits, and  risks including bleeding, blood transfusion, infection, stroke, graft  failure, myocardial  infarction and death.  I also discussed the  unpredictable long term results in his particular case due to the severe  diffuse nature of his coronary artery disease.  They understood and agreed  to proceed.   DESCRIPTION OF PROCEDURE:  The patient was taken to the operating room and  placed on the table in supine position.  After induction of general  endotracheal anesthesia, a Foley catheter was placed in the bladder using  sterile technique.  Then the chest, abdomen and both lower extremities were  prepped and draped in the usual sterile manner.  The chest was entered  through a median sternotomy incision and the pericardium opened in the  midline.  Examination of the heart showed good ventricular contractility.  The ascending aorta had no palpable plaques in it.   Then the left internal mammary artery was harvested from the chest wall as a  pedicle graft.  This was a medium caliber vessel with  excellent blood flow  through it.  At the same time, a segment of greater saphenous vein was  harvested from the right leg using endoscopic vein harvest technique.  This  vein was of medium caliber to large and good quality.   Then the patient was heparinized and when an adequate activated clotting  time was achieved, the distal ascending aorta was cannulated using a 20  French aortic cannula for arterial inflow.  Venous outflow was achieved  using a two-stage venous cannula for the right atrial appendage.  An  antegrade cardioplegia and vent cannula was inserted in the aortic root.   The patient was placed on cardiopulmonary bypass and the distal coronary  arteries identified.  He had severe diffuse coronary disease with plaque  extending throughout the length of all of his arteries.   Then the aorta was crossclamped and 500 cc of cold blood antegrade  cardioplegia was administered in the aortic root with quick arrest of the  heart.  Systemic hypothermia to 20 degrees centigrade and topical  hypothermia with iced saline was used.  The temperature probe was placed in  the septum and in insulating pad in the pericardium.   The first distal anastomosis was performed to the distal right coronary  artery just before the takeoff of the posterior descending branch.  The  internal diameter was greater than 2.5 mm.  There was one area here that was  soft enough to sew a bypass into.  The conduit used was  a segment of  greater saphenous vein.  The anastomosis was performed in an end-to-side  manner using continuous 7-0 Prolene suture.  Flow was measured through the  graft and was excellent.  The posterior descending and posterolateral  branches themselves were smaller diffusely diseased vessels that were not  graftable themselves.   A second distal anastomosis was performed to the obtuse marginal branch.  Again, this was a diffusely diseased vessel with one soft area in the midportion over a  short segment that was suitable for grafting.  The  remainder of the artery was hard and calcified and not graftable.  The  internal diameter in this area was about 1.75 mm.  The conduit used was a  segment of the greater saphenous vein.  The anastomosis was performed in a  sequential end-to-side manner using continuous 7-0 Prolene suture.  Flow was  measured through the graft and was excellent.  Then a dose of cardioplegia  was given down the vein grafts and the aortic root.   The  third distal anastomosis was performed to the diagonal marginal branch.  There was only one diagonal branch that was large enough to graft.  The  internal diameter of this vessel was about 1.5 mm.  The conduit used was a  third segment of greater saphenous vein.  The anastomosis was performed in  an end-to-side manner using continuous 8-0 Prolene suture.  Flow was  measured through the graft and was good.   The fourth distal anastomosis was performed to the distal portion of the  left anterior descending coronary artery.  The internal diameter of this  vessel was about 1.75 mm.  The conduit used was the left internal mammary  graft and this was brought through an opening in the left pericardium and  anterior to the phrenic nerve.  It was anastomosed to the LAD in an end-to-  side manner using continuous 8-0 Prolene suture.  The pedicle was tacked to  the epicardium with 6-0 Prolene sutures.   The patient was rewarmed to 37 degrees centigrade and the clamp removed from  the mammary pedicle.  There was rapid warming of the ventricular septum and  return of spontaneous ventricular fibrillation.  The crossclamp was removed  with time of 66 minutes and the patient defibrillated into sinus rhythm.   A partial occlusion clamp was placed on the aortic root and three proximal  vein graft anastomoses were performed in end-to-side manner using continuous  6-0 Prolene suture.  The clamp was removed, the vein grafts  deaired and the  clamps removed from them.  The proximal and distal anastomoses appeared  hemostatic and the line of the graft satisfactory.  Graft markers were  placed around the proximal anastomoses.  Two temporary right ventricular and  right atrial pacing wires placed and brought out through the skin.   When the patient had rewarmed to 37 degrees centigrade, he was weaned from  cardiopulmonary bypass on low-dose dopamine.  Total bypass time was 107  minutes.  Cardiac function appeared excellent with a cardiac output of 5L  per minute.  Protamine was given and the venous and aortic cannulas were  removed without difficulty.  Hemostasis was achieved.  Eight units of  platelets were given due to thrombocytopenia and a platelet count of 90,000.  Three chest tubes were placed with a tube in the posterior pericardium, one  in the left pleural space and one in the anterior mediastinum.  The  pericardium was loosely approximated over the heart.  The sternum was closed with #6 stainless steel wires.  The fascia was closed with continuous #1  Vicryl suture.  The subcutaneous tissue was closed with continuous 2-0  Vicryl and the skin with 3-0 Vicryl subcuticular closure.  The lower  extremity vein harvest sites were closed in layers in a similar manner.  Sponge, needle and instrument counts were correct according to the scrub  nurse.  Dry sterile dressings were applied over the incisions, around the  chest tubes which were hooked to Pleur-evac suction.  The patient remained  hemodynamically stable and was transported to the SICU in guarded but stable  condition.                                               Alleen Borne, M.D.    BKB/MEDQ  D:  04/05/2002  T:  04/06/2002  Job:  [id number]  cc:   Vesta Mixer, M.D.

## 2010-12-20 ENCOUNTER — Encounter: Payer: Self-pay | Admitting: Internal Medicine

## 2010-12-25 ENCOUNTER — Other Ambulatory Visit: Payer: Self-pay | Admitting: Cardiovascular Disease

## 2010-12-25 NOTE — Telephone Encounter (Signed)
Fax received from pharmacy. Refill completed. Jodette Roxanna Mcever RN  

## 2011-01-20 ENCOUNTER — Other Ambulatory Visit: Payer: Self-pay | Admitting: Internal Medicine

## 2011-01-20 ENCOUNTER — Encounter (HOSPITAL_BASED_OUTPATIENT_CLINIC_OR_DEPARTMENT_OTHER): Payer: PRIVATE HEALTH INSURANCE | Admitting: Internal Medicine

## 2011-01-20 DIAGNOSIS — D61818 Other pancytopenia: Secondary | ICD-10-CM

## 2011-01-20 DIAGNOSIS — D693 Immune thrombocytopenic purpura: Secondary | ICD-10-CM

## 2011-01-20 DIAGNOSIS — E119 Type 2 diabetes mellitus without complications: Secondary | ICD-10-CM

## 2011-01-20 LAB — CBC WITH DIFFERENTIAL/PLATELET
Basophils Absolute: 0 10*3/uL (ref 0.0–0.1)
EOS%: 5.4 % (ref 0.0–7.0)
Eosinophils Absolute: 0.2 10*3/uL (ref 0.0–0.5)
HGB: 13.1 g/dL (ref 13.0–17.1)
LYMPH%: 37 % (ref 14.0–49.0)
MCH: 36 pg — ABNORMAL HIGH (ref 27.2–33.4)
MCV: 101.2 fL — ABNORMAL HIGH (ref 79.3–98.0)
MONO%: 9.2 % (ref 0.0–14.0)
NEUT#: 2.1 10*3/uL (ref 1.5–6.5)
Platelets: 61 10*3/uL — ABNORMAL LOW (ref 140–400)
RBC: 3.63 10*6/uL — ABNORMAL LOW (ref 4.20–5.82)
RDW: 13.2 % (ref 11.0–14.6)

## 2011-01-20 LAB — LACTATE DEHYDROGENASE: LDH: 212 U/L (ref 94–250)

## 2011-03-24 ENCOUNTER — Ambulatory Visit (INDEPENDENT_AMBULATORY_CARE_PROVIDER_SITE_OTHER): Payer: PRIVATE HEALTH INSURANCE | Admitting: Internal Medicine

## 2011-03-24 ENCOUNTER — Other Ambulatory Visit (INDEPENDENT_AMBULATORY_CARE_PROVIDER_SITE_OTHER): Payer: PRIVATE HEALTH INSURANCE

## 2011-03-24 ENCOUNTER — Encounter: Payer: Self-pay | Admitting: Internal Medicine

## 2011-03-24 DIAGNOSIS — R7401 Elevation of levels of liver transaminase levels: Secondary | ICD-10-CM

## 2011-03-24 DIAGNOSIS — D689 Coagulation defect, unspecified: Secondary | ICD-10-CM

## 2011-03-24 NOTE — Progress Notes (Signed)
HISTORY OF PRESENT ILLNESS:  [name] is a 70 y.o. male with coronary artery disease, diabetes mellitus, and hyperlipidemia. He has been seen in esophagus previously for screening colonoscopy and abnormal liver tests. Additionally, pancytopenia. He has also had hematology evaluation, including unrevealing bone marrow biopsy. He was last seen December 2011. He presents today for routine followup. He is accompanied by his wife. He is suspected as having occult liver disease without obvious portal hypertension. Since his last visit, he has been doing well. His weight has been stable. No problems with edema, bleeding, or encephalopathy. Other general medical problems are stable.  REVIEW OF SYSTEMS:  All non-GI ROS negative except for shoulder pain  Past Medical History  Diagnosis Date  . CAD (coronary artery disease)   . Diabetes mellitus   . Pancytopenia   . Hiatal hernia 2011    EGD  . Diverticulosis of colon (without mention of hemorrhage) 2011    Colonoscopy   . Hypertension     Past Surgical History  Procedure Date  . Hernia repair   . Coronary angioplasty with stent placement   . Coronary artery bypass graft     Social History [name]  reports that he has never smoked. He has never used smokeless tobacco. He reports that he does not drink alcohol or use illicit drugs.  family history is negative for Colon cancer.  No Known Allergies     PHYSICAL EXAMINATION: Vital signs: BP 102/60  Pulse 68  Ht 5\' 10"  (1.778 m)  Wt 167 lb (75.751 kg)  BMI 23.96 kg/m2  Constitutional: generally well-appearing, no acute distress Psychiatric: alert and oriented x3, cooperative Eyes: extraocular movements intact, anicteric, conjunctiva pink Mouth: oral pharynx moist, no lesions Neck: supple no lymphadenopathy Cardiovascular: heart regular rate and rhythm, no murmur Lungs: clear to auscultation bilaterally Abdomen: soft, nontender, nondistended, no obvious ascites, no  peritoneal signs, normal bowel sounds, no organomegaly Extremities: no lower extremity edema bilaterally Skin: no lesions on visible extremities Neuro: No focal deficits. No asterixis.     ASSESSMENT:  #1. Elevated liver tests. The patient is suspected as having occult cryptogenic liver disease with impaired hepatic synthetic function but no evidence of significant portal hypertension. I again discussed this today with the patient and his wife. He has been stable since his visit last year. Nothing to suggest decompensation in his condition.  PLAN:  #1. Repeat liver tests and PT/INR today #2. Routine GI office followup in 1 year. Sooner interval followup for clinical problems.

## 2011-03-24 NOTE — Patient Instructions (Signed)
Labs ordered for you to have drawn today on basement floor. Office visit in 1 year.

## 2011-03-25 ENCOUNTER — Telehealth: Payer: Self-pay

## 2011-03-25 LAB — HEPATIC FUNCTION PANEL
Alkaline Phosphatase: 76 U/L (ref 39–117)
Bilirubin, Direct: 0.2 mg/dL (ref 0.0–0.3)
Total Bilirubin: 1.3 mg/dL — ABNORMAL HIGH (ref 0.3–1.2)
Total Protein: 6.1 g/dL (ref 6.0–8.3)

## 2011-03-25 LAB — PROTIME-INR: INR: 1.3 ratio — ABNORMAL HIGH (ref 0.8–1.0)

## 2011-03-25 NOTE — Telephone Encounter (Signed)
Pt aware.

## 2011-03-25 NOTE — Telephone Encounter (Signed)
Message copied by Michele Mcalpine on Wed Mar 25, 2011  1:24 PM ------      Message from: Hilarie Fredrickson      Created: Wed Mar 25, 2011 11:27 AM       Let pt  Know that his blood work is stable

## 2011-04-08 LAB — APTT: aPTT: 28

## 2011-04-08 LAB — PROTIME-INR: Prothrombin Time: 13.7

## 2011-04-14 ENCOUNTER — Encounter: Payer: Self-pay | Admitting: Cardiovascular Disease

## 2011-04-14 ENCOUNTER — Ambulatory Visit (INDEPENDENT_AMBULATORY_CARE_PROVIDER_SITE_OTHER): Payer: PRIVATE HEALTH INSURANCE | Admitting: Cardiovascular Disease

## 2011-04-14 DIAGNOSIS — E785 Hyperlipidemia, unspecified: Secondary | ICD-10-CM

## 2011-04-14 DIAGNOSIS — I2510 Atherosclerotic heart disease of native coronary artery without angina pectoris: Secondary | ICD-10-CM

## 2011-04-14 DIAGNOSIS — I951 Orthostatic hypotension: Secondary | ICD-10-CM

## 2011-04-14 MED ORDER — ISOSORBIDE MONONITRATE ER 60 MG PO TB24: ORAL_TABLET | ORAL | Status: DC

## 2011-04-14 NOTE — Assessment & Plan Note (Signed)
He's having some symptoms of dizziness that I think are due to orthostatic hypotension. He's on isosorbide and is also on doxazosin. He seems to be doing quite well from a cardiac standpoint and is exercising and doing lots of yard work without any chest pain. I'll write to continue with the isosorbide. We'll stop the doxazosin and see how he does.

## 2011-04-14 NOTE — Patient Instructions (Signed)
Your physician wants you to follow-up in:  6 months. You will receive a reminder letter in the mail two months in advance. If you don't receive a letter, please call our office to schedule the follow-up appointment.   

## 2011-04-14 NOTE — Progress Notes (Signed)
[name] Date of Birth  10-10-1940 [facility] [address]    [address] [place], [place]  [address] [phone]  Fax  [phone]  History of Present Illness:  [name] is an 70 year old gentleman with a history of coronary artery disease. He status post coronary artery bypass grafting.  He is also status post PTCA and stenting.  No history of hyperlipidemia and diabetes mellitus.  He's having lots of episodes of dizziness.  These symptoms sound like orthostatic hypotension.    Current Outpatient Prescriptions on File Prior to Visit  Medication Sig Dispense Refill  . aspirin 81 MG tablet Take 81 mg by mouth daily.        . carvedilol (COREG) 3.125 MG tablet Take 3.125 mg by mouth 2 (two) times daily with a meal.        . clopidogrel (PLAVIX) 75 MG tablet Take 75 mg by mouth daily.        Marland Kitchen doxazosin (CARDURA) 2 MG tablet Take 2 mg by mouth at bedtime.        . isosorbide mononitrate (IMDUR) 60 MG 24 hr tablet TAKE 1 TABLET BY MOUTH EVERY DAY  30 tablet  11  . metFORMIN (GLUCOPHAGE) 500 MG tablet Take 500 mg by mouth 2 (two) times daily with a meal.        . Multiple Vitamin (MULTIVITAMIN) tablet Take 1 tablet by mouth daily.        . niacin 500 MG tablet Take 500 mg by mouth 3 (three) times daily with meals.        . Omega-3 Fatty Acids (SEA-OMEGA PO) Take by mouth. Take 1200 mg by mouth twice daily        he is actually only taking one half of the isosorbide tablet each day (30 mg)  No Known Allergies  Past Medical History  Diagnosis Date  . CAD (coronary artery disease)   . Diabetes mellitus   . Pancytopenia   . Hiatal hernia 2011    EGD  . Diverticulosis of colon (without mention of hemorrhage) 2011    Colonoscopy   . Hypertension     Past Surgical History  Procedure Date  . Hernia repair   . Coronary angioplasty with stent placement   . Coronary artery bypass graft     History  Smoking status  . Never Smoker   Smokeless tobacco  . Never Used      History  Alcohol Use No    Family History  Problem Relation Age of Onset  . Colon cancer Neg Hx     Reviw of Systems:  Reviewed in the HPI.  All other systems are negative.  Physical Exam: BP 102/64  Pulse 64  Ht 5\' 10"  (1.778 m)  Wt 170 lb 12.8 oz (77.474 kg)  BMI 24.51 kg/m2 The patient is alert and oriented x 3.  The mood and affect are normal.   Skin: warm and dry.  Color is normal.    HEENT:   the sclera are nonicteric.  The mucous membranes are moist.  The carotids are 2+ without bruits.  There is no thyromegaly.  There is no JVD.    Lungs: clear.  The chest wall is non tender.    Heart: regular rate with a normal S1 and S2.  There are no murmurs, gallops, or rubs. The PMI is not displaced.     Abdomen: good bowel sounds.  There is no guarding or rebound.  There is no hepatosplenomegaly  or tenderness.  There are no masses.   Extremities:  no clubbing, cyanosis, or edema.  The legs are without rashes.  The distal pulses are intact.   Neuro:  Cranial nerves II - XII are intact.  Motor and sensory functions are intact.    The gait is normal.  ECG: Sinus bradycardia. He has no ST or T wave changes.   Assessment / Plan:

## 2011-04-14 NOTE — Assessment & Plan Note (Signed)
He's not had any episodes of chest pain or shortness breath. He continues with aspirin Plavix because he has a stent in the left main. He does note some bruising and bleeding.  He's also had some problems with thrombocytopenia. His platelet count has gotten as low as 31,000. At that point he was having episodes of spontaneous bleeding.  Currently his platelet count is around 61,000. As long as his platelet count remains at this level, I would like to continue with the aspirin and Plavix. If we have to discontinue one of those medications I would rather continue with Plavix and discontinue the aspirin.

## 2011-04-15 ENCOUNTER — Encounter: Payer: Self-pay | Admitting: Cardiovascular Disease

## 2011-04-16 ENCOUNTER — Encounter: Payer: Self-pay | Admitting: *Deleted

## 2011-05-27 ENCOUNTER — Encounter: Payer: Self-pay | Admitting: Cardiovascular Disease

## 2011-06-24 ENCOUNTER — Telehealth: Payer: Self-pay | Admitting: Internal Medicine

## 2011-06-24 NOTE — Telephone Encounter (Signed)
GAVE APPT INFO TO SPOUSE FOR 07/27/2011,PER POF FROM 01/20/11 [facility]

## 2011-07-23 ENCOUNTER — Other Ambulatory Visit: Payer: Self-pay | Admitting: Internal Medicine

## 2011-07-23 DIAGNOSIS — D693 Immune thrombocytopenic purpura: Secondary | ICD-10-CM

## 2011-07-27 ENCOUNTER — Ambulatory Visit (HOSPITAL_BASED_OUTPATIENT_CLINIC_OR_DEPARTMENT_OTHER): Payer: PRIVATE HEALTH INSURANCE | Admitting: Internal Medicine

## 2011-07-27 ENCOUNTER — Other Ambulatory Visit (HOSPITAL_BASED_OUTPATIENT_CLINIC_OR_DEPARTMENT_OTHER): Payer: PRIVATE HEALTH INSURANCE | Admitting: Lab

## 2011-07-27 ENCOUNTER — Other Ambulatory Visit: Payer: Self-pay | Admitting: Internal Medicine

## 2011-07-27 VITALS — BP 110/63 | HR 96 | Temp 97.2°F | Ht 70.0 in | Wt 175.5 lb

## 2011-07-27 DIAGNOSIS — D693 Immune thrombocytopenic purpura: Secondary | ICD-10-CM

## 2011-07-27 LAB — CBC WITH DIFFERENTIAL/PLATELET
BASO%: 0.6 % (ref 0.0–2.0)
EOS%: 4.3 % (ref 0.0–7.0)
MCH: 35.6 pg — ABNORMAL HIGH (ref 27.2–33.4)
MCHC: 35.7 g/dL (ref 32.0–36.0)
NEUT%: 64.1 % (ref 39.0–75.0)
RBC: 3.84 10*6/uL — ABNORMAL LOW (ref 4.20–5.82)
RDW: 12.7 % (ref 11.0–14.6)
lymph#: 1 10*3/uL (ref 0.9–3.3)

## 2011-07-27 NOTE — Progress Notes (Signed)
[name] OFFICE PROGRESS NOTE  Caleb Miyamoto, MD [address] [place] [address]  DIAGNOSIS: Idiopathic thrombocytopenic purpura  PRIOR THERAPY: None  CURRENT THERAPY: Observation.  INTERVAL HISTORY: [name] 71 y.o. male returns to the clinic today for routine six-month followup visit accompanied his wife. The patient has no complaints today. He denied having any bleeding, bruises or ecchymosis. Has no chest pain or shortness of breath. No weight loss or night sweats. He has a repeat CBC performed earlier today and he is here for evaluation and discussion of his lab results.  MEDICAL HISTORY: Past Medical History  Diagnosis Date  . Diabetes mellitus   . Pancytopenia   . Hiatal hernia 2011    EGD  . Diverticulosis of colon (without mention of hemorrhage) 2011    Colonoscopy   . Hypertension   . ITP (idiopathic thrombocytopenic purpura)     He has been diagnosed with  . CAD (coronary artery disease)     Hx of     ALLERGIES:  is allergic to crestor; lipitor; and zocor.  MEDICATIONS:  Current Outpatient Prescriptions  Medication Sig Dispense Refill  . aspirin 81 MG tablet Take 81 mg by mouth daily.        . clopidogrel (PLAVIX) 75 MG tablet Take 75 mg by mouth daily.        . isosorbide mononitrate (IMDUR) 60 MG 24 hr tablet Take one half tablet (30 mg) daily by mouth  30 tablet  11  . metFORMIN (GLUCOPHAGE) 500 MG tablet Take 500 mg by mouth 2 (two) times daily with a meal.        . Multiple Vitamin (MULTIVITAMIN) tablet Take 1 tablet by mouth daily.        . niacin 500 MG tablet Take 500 mg by mouth 3 (three) times daily with meals.        . nitroGLYCERIN (NITROSTAT) 0.4 MG SL tablet Place 0.4 mg under the tongue every 5 (five) minutes as needed.        . Omega-3 Fatty Acids (SEA-OMEGA PO) Take by mouth. Take 1200 mg by mouth twice daily       . carvedilol (COREG) 3.125 MG tablet Take 3.125 mg by mouth 2 (two) times daily with a meal.         . Tamsulosin HCl (FLOMAX) 0.4 MG CAPS Take by mouth daily.          SURGICAL HISTORY:  Past Surgical History  Procedure Date  . Hernia repair   . Coronary angioplasty with stent placement   . US echocardiography 02-08-2007    Est. EF 40-45%  . Coronary artery bypass graft     x4 with a left internal mammary artery anastomosis to the left anterior descending coronary artery   . Saphenous vein graft resection     graft to the first obtuse marginal, a saphenous vein graft to the first diagonal coronary artery  and a saphenous vein graft to the distal right coronary artery  . Cardiac catheterization     His last heart catheterization in May of 2009 reveals a patent LIMA  . Coronary stent placement     successful percutaneous transluminal coronary angioplasty and stenting of the left main coronary artery  . Coronary angioplasty     Successful percutaneous transluminal coronary angioplasty of the left circumflex obuse marginal vessel    REVIEW OF SYSTEMS:  A comprehensive review of systems was negative.   PHYSICAL EXAMINATION: General appearance: alert,  cooperative and no distress Head: Normocephalic, without obvious abnormality, atraumatic Neck: no adenopathy Lymph nodes: Cervical, supraclavicular, and axillary nodes normal. Resp: clear to auscultation bilaterally Cardio: regular rate and rhythm, S1, S2 normal, no murmur, click, rub or gallop GI: soft, non-tender; bowel sounds normal; no masses,  no organomegaly Extremities: extremities normal, atraumatic, no cyanosis or edema Neurologic: Alert and oriented X 3, normal strength and tone. Normal symmetric reflexes. Normal coordination and gait  ECOG PERFORMANCE STATUS: 0 - Asymptomatic  Blood pressure 110/63, pulse 96, temperature 97.2 F (36.2 C), temperature source Oral, height 5\' 10"  (1.778 m), weight 175 lb 8 oz (79.606 kg).  LABORATORY DATA: Lab Results  Component Value Date   WBC 4.3 07/27/2011   HGB 13.6 07/27/2011   HCT  38.3* 07/27/2011   MCV 99.7* 07/27/2011   PLT 82* 07/27/2011      Chemistry      Component Value Date/Time   NA 135 10/15/2010 1136   K 3.9 10/15/2010 1136   CL 104 10/15/2010 1136   CO2 25 10/15/2010 1136   BUN 12 10/15/2010 1136   CREATININE 0.86 10/15/2010 1136      Component Value Date/Time   CALCIUM 8.5 10/15/2010 1136   ALKPHOS 76 03/24/2011 1457   AST 45* 03/24/2011 1457   ALT 39 03/24/2011 1457   BILITOT 1.3* 03/24/2011 1457       ASSESSMENT: Ms. a very pleasant 71 years old white male with history of idiopathic thrombocytopenic purpura currently on observation. The patient is doing fine with no evidence for worsening thrombocytopenia. Actually his platelets count is better today. I discussed the lab result with the patient and his wife  PLAN: I recommended for him continuous observation for now. The patient come back for followup visit in 6 months with repeat CBC, CMET and LDH. He was advised to call me immediately she has any concerning symptoms in the interval and specifically any bleeding, bruises or ecchymosis.  All questions were answered. The patient knows to call the clinic with any problems, questions or concerns. We can certainly see the patient much sooner if necessary.

## 2011-09-15 ENCOUNTER — Encounter: Payer: Self-pay | Admitting: Cardiovascular Disease

## 2011-09-29 ENCOUNTER — Encounter: Payer: Self-pay | Admitting: Cardiovascular Disease

## 2011-09-29 ENCOUNTER — Ambulatory Visit (INDEPENDENT_AMBULATORY_CARE_PROVIDER_SITE_OTHER): Payer: Medicare Other | Admitting: Cardiovascular Disease

## 2011-09-29 VITALS — BP 126/71 | HR 99 | Ht 70.0 in | Wt 177.4 lb

## 2011-09-29 DIAGNOSIS — I509 Heart failure, unspecified: Secondary | ICD-10-CM

## 2011-09-29 DIAGNOSIS — I2510 Atherosclerotic heart disease of native coronary artery without angina pectoris: Secondary | ICD-10-CM

## 2011-09-29 DIAGNOSIS — I10 Essential (primary) hypertension: Secondary | ICD-10-CM

## 2011-09-29 HISTORY — DX: Heart failure, unspecified: I50.9

## 2011-09-29 LAB — TSH: TSH: 2.12 u[IU]/mL (ref 0.35–5.50)

## 2011-09-29 MED ORDER — METOPROLOL TARTRATE 25 MG PO TABS: 25.0000 mg | ORAL_TABLET | Freq: Two times a day (BID) | ORAL | Status: DC

## 2011-09-29 NOTE — Progress Notes (Signed)
[name] Date of Birth  07/22/1940 [facility] [address]    [address] [place], [place]  [address] [phone]  Fax  [phone]  History of Present Illness:  [name] is an 71 year old gentleman with a history of coronary artery disease. He status post coronary artery bypass grafting.  He is also status post PTCA and stenting.  No history of hyperlipidemia and diabetes mellitus.  He's having lots of episodes of dizziness.  These symptoms sound like orthostatic hypotension.    He has had a fast HR for the past year.  He denies any diarrhea, heat tolerance, or weight loss. He's not having episodes of angina. He's not had any episodes of dizziness since we stopped his carvedilol last year.  Current Outpatient Prescriptions on File Prior to Visit  Medication Sig Dispense Refill  . aspirin 81 MG tablet Take 81 mg by mouth daily.        . clopidogrel (PLAVIX) 75 MG tablet Take 75 mg by mouth daily.        . isosorbide mononitrate (IMDUR) 60 MG 24 hr tablet Take one half tablet (30 mg) daily by mouth  30 tablet  11  . metFORMIN (GLUCOPHAGE) 500 MG tablet Take 500 mg by mouth 2 (two) times daily with a meal.        . Multiple Vitamin (MULTIVITAMIN) tablet Take 1 tablet by mouth daily.        . niacin 500 MG tablet Take 500 mg by mouth 3 (three) times daily with meals.        . Omega-3 Fatty Acids (SEA-OMEGA PO) Take by mouth. Take 1200 mg by mouth twice daily       . Tamsulosin HCl (FLOMAX) 0.4 MG CAPS Take by mouth daily.        . nitroGLYCERIN (NITROSTAT) 0.4 MG SL tablet Place 0.4 mg under the tongue every 5 (five) minutes as needed.         he is actually only taking one half of the isosorbide tablet each day (30 mg)  Allergies  Allergen Reactions  . Crestor (Rosuvastatin Calcium)     Causes Muscle Pain  . Lipitor (Atorvastatin Calcium)     Causes memory loss  . Zocor (Simvastatin)     Causes muscle pain    Past Medical History  Diagnosis Date  . Diabetes  mellitus   . Pancytopenia   . Hiatal hernia 2011    EGD  . Diverticulosis of colon (without mention of hemorrhage) 2011    Colonoscopy   . Hypertension   . ITP (idiopathic thrombocytopenic purpura)     He has been diagnosed with  . CAD (coronary artery disease)     Hx of     Past Surgical History  Procedure Date  . Hernia repair   . Coronary angioplasty with stent placement   . US echocardiography 02-08-2007    Est. EF 40-45%  . Coronary artery bypass graft     x4 with a left internal mammary artery anastomosis to the left anterior descending coronary artery   . Saphenous vein graft resection     graft to the first obtuse marginal, a saphenous vein graft to the first diagonal coronary artery  and a saphenous vein graft to the distal right coronary artery  . Cardiac catheterization     His last heart catheterization in May of 2009 reveals a patent LIMA  . Coronary stent placement     successful percutaneous transluminal coronary angioplasty and  stenting of the left main coronary artery  . Coronary angioplasty     Successful percutaneous transluminal coronary angioplasty of the left circumflex obuse marginal vessel    History  Smoking status  . Never Smoker   Smokeless tobacco  . Never Used    History  Alcohol Use No    Family History  Problem Relation Age of Onset  . Colon cancer Neg Hx     Reviw of Systems:  Reviewed in the HPI.  All other systems are negative.  Physical Exam: BP 126/71  Pulse 99  Ht 5\' 10"  (1.778 m)  Wt 177 lb 6.4 oz (80.468 kg)  BMI 25.45 kg/m2 The patient is alert and oriented x 3.  The mood and affect are normal.   Skin: warm and dry.  Color is normal.    HEENT:   the sclera are nonicteric.  The mucous membranes are moist.  The carotids are 2+ without bruits.  There is no thyromegaly.  There is no JVD.    Lungs: clear.  The chest wall is non tender.    Heart: regular rate with a normal S1 and S2.  There are no murmurs, gallops, or  rubs. The PMI is not displaced.     Abdomen: good bowel sounds.  There is no guarding or rebound.  There is no hepatosplenomegaly or tenderness.  There are no masses.   Extremities:  no clubbing, cyanosis, or edema.  The legs are without rashes.  The distal pulses are intact.   Neuro:  Cranial nerves II - XII are intact.  Motor and sensory functions are intact.    The gait is normal.  ECG: Sinus bradycardia. He has no ST or T wave changes.   Assessment / Plan:

## 2011-09-29 NOTE — Assessment & Plan Note (Signed)
He's doing quite well. He's not had any episodes of angina.

## 2011-09-29 NOTE — Assessment & Plan Note (Addendum)
[name] has an ejection fraction of between 40 and 45%. He has chronic systolic congestive heart failure. We will try to add low-dose metoprolol to help with his tachycardia. This will also help with his congestive heart failure. I'll see him again in 3 months for followup visit.  Because of his tachycardia we will also check some labs including a TSH. He currently had lab work with his medical doctor and everything else checked out okay.

## 2011-09-29 NOTE — Patient Instructions (Signed)
Your physician recommends that you schedule a follow-up appointment in: 3 months   Your physician recommends that you return for lab work in: today and in 3 months    Your physician has recommended you make the following change in your medication:  START IN ADDITION TO YOUR MEDS; METOPROLOL 25 MG ONE TABLET TWICE DAILY. FOR HEART FAILURE AND HEART RATE CONTROL

## 2011-10-23 ENCOUNTER — Other Ambulatory Visit: Payer: Self-pay | Admitting: Cardiovascular Disease

## 2011-10-23 ENCOUNTER — Telehealth: Payer: Self-pay | Admitting: Cardiovascular Disease

## 2011-10-23 MED ORDER — CLOPIDOGREL BISULFATE 75 MG PO TABS: 75.0000 mg | ORAL_TABLET | Freq: Every day | ORAL | Status: DC

## 2011-10-23 NOTE — Telephone Encounter (Signed)
Opened in Error.

## 2011-10-23 NOTE — Telephone Encounter (Signed)
Refill F/U   Patient f/u on Plavix med refill initiated on 4/11, patient still does not have medicine.  Med notes state refill went out today 4/12. Verified pharmacy correct and instructed patient to check back with pharmacy at noon to allow time to fill.  Summary: Take 1 tablet (75 mg total) by mouth daily., Starting 10/23/2011, Until Discontinued, Normal Dose, Route, Frequency: 75 mg, Oral, Daily  Start: 10/23/2011  Ord/Sold: 10/23/2011 (O)  Report  Taking:  Long-term:   Pharmacy: [id number] - [place], [place] - [address]  Med Dose History  Change   Patient Sig: Take 1 tablet (75 mg total) by mouth daily.   Ordered on: 10/23/2011   Authorized by: Vesta Mixer   Dispense: 90 tablet

## 2012-01-05 ENCOUNTER — Encounter: Payer: Self-pay | Admitting: Cardiovascular Disease

## 2012-01-05 ENCOUNTER — Ambulatory Visit (INDEPENDENT_AMBULATORY_CARE_PROVIDER_SITE_OTHER): Payer: Medicare Other | Admitting: Cardiovascular Disease

## 2012-01-05 VITALS — BP 110/63 | HR 60 | Ht 70.0 in | Wt 178.0 lb

## 2012-01-05 DIAGNOSIS — I2510 Atherosclerotic heart disease of native coronary artery without angina pectoris: Secondary | ICD-10-CM

## 2012-01-05 NOTE — Progress Notes (Signed)
[name] Date of Birth  August 03, 1940       [facility] Office [address]  [address] [place], [place]  [address]   [place], [place]  [address] [phone]     [phone]   Fax  [phone]    Fax [phone]  Problem List: 1. Coronary artery disease-status post MI and eventually s/p  CABG and also stenting 2. Hyperlipidemia-he has been generally intolerant of statins-Crestor causes muscle pain, Lipitor causes memory loss, Zocor causes muscle pain. 2. Diabetes mellitus  History of Present Illness: [name] is an 71 year old gentleman with a history of coronary artery disease. He status post coronary artery bypass grafting. He is also status post PTCA and stenting. He also has a  history of hyperlipidemia and diabetes mellitus.   He is exercising regularly .  He does lots of yard work.  He enjoys going to his beach house.  Current Outpatient Prescriptions on File Prior to Visit  Medication Sig Dispense Refill  . aspirin 81 MG tablet Take 81 mg by mouth daily.        . clopidogrel (PLAVIX) 75 MG tablet Take 1 tablet (75 mg total) by mouth daily.  90 tablet  3  . isosorbide mononitrate (IMDUR) 60 MG 24 hr tablet Take one half tablet (30 mg) daily by mouth  30 tablet  11  . metFORMIN (GLUCOPHAGE) 500 MG tablet Take 500 mg by mouth 2 (two) times daily with a meal.        . metoprolol tartrate (LOPRESSOR) 25 MG tablet Take 1 tablet (25 mg total) by mouth 2 (two) times daily.  60 tablet  11  . Multiple Vitamin (MULTIVITAMIN) tablet Take 1 tablet by mouth daily.        . niacin 500 MG tablet Take 500 mg by mouth 3 (three) times daily with meals.        . nitroGLYCERIN (NITROSTAT) 0.4 MG SL tablet Place 0.4 mg under the tongue every 5 (five) minutes as needed.        . Omega-3 Fatty Acids (SEA-OMEGA PO) Take by mouth. Take 1200 mg by mouth twice daily       . Tamsulosin HCl (FLOMAX) 0.4 MG CAPS Take by mouth daily.        Marland Kitchen  DISCONTD: carvedilol (COREG) 3.125 MG tablet Take 3.125 mg by mouth 2 (two) times daily with a meal.          Allergies  Allergen Reactions  . Crestor (Rosuvastatin Calcium)     Causes Muscle Pain  . Lipitor (Atorvastatin Calcium)     Causes memory loss  . Zocor (Simvastatin)     Causes muscle pain    Past Medical History  Diagnosis Date  . Diabetes mellitus   . Pancytopenia   . Hiatal hernia 2011    EGD  . Diverticulosis of colon (without mention of hemorrhage) 2011    Colonoscopy   . Hypertension   . ITP (idiopathic thrombocytopenic purpura)     He has been diagnosed with  . CAD (coronary artery disease)     Hx of     Past Surgical History  Procedure Date  . Hernia repair   . Coronary angioplasty with stent placement   . US echocardiography 02-08-2007    Est. EF 40-45%  . Coronary artery bypass graft     x4 with a left internal mammary artery anastomosis to the left anterior descending  coronary artery   . Saphenous vein graft resection     graft to the first obtuse marginal, a saphenous vein graft to the first diagonal coronary artery  and a saphenous vein graft to the distal right coronary artery  . Cardiac catheterization     His last heart catheterization in May of 2009 reveals a patent LIMA  . Coronary stent placement     successful percutaneous transluminal coronary angioplasty and stenting of the left main coronary artery  . Coronary angioplasty     Successful percutaneous transluminal coronary angioplasty of the left circumflex obuse marginal vessel    History  Smoking status  . Never Smoker   Smokeless tobacco  . Never Used    History  Alcohol Use No    Family History  Problem Relation Age of Onset  . Colon cancer Neg Hx     Reviw of Systems:  Reviewed in the HPI.  All other systems are negative.  Physical Exam: Blood pressure 110/63, pulse 60, height 5\' 10"  (1.778 m), weight 178 lb (80.74 kg). General: Well developed, well nourished, in no  acute distress.  Head: Normocephalic, atraumatic, sclera non-icteric, mucus membranes are moist,   Neck: Supple. Carotids are 2 + without bruits. No JVD  Lungs: Clear bilaterally to auscultation.  Heart: regular rate.  normal  S1 S2. No murmurs, gallops or rubs.  Abdomen: Soft, non-tender, non-distended with normal bowel sounds. No hepatomegaly. No rebound/guarding. No masses.  Msk:  Strength and tone are normal  Extremities: No clubbing or cyanosis. No edema.  Distal pedal pulses are 2+ and equal bilaterally.  Neuro: Alert and oriented X 3. Moves all extremities spontaneously.  Psych:  Responds to questions appropriately with a normal affect.  ECG:  Assessment / Plan:

## 2012-01-05 NOTE — Patient Instructions (Addendum)
Your physician wants you to follow-up in: 6 months  You will receive a reminder letter in the mail two months in advance. If you don't receive a letter, please call our office to schedule the follow-up appointment.  Your physician recommends that you return for a FASTING lipid profile: 6 months   

## 2012-01-05 NOTE — Assessment & Plan Note (Signed)
[name] is doing well. We'll continue with his same medications.  He continues  to do well following his bypass grafting and stent procedures.

## 2012-01-26 ENCOUNTER — Ambulatory Visit (HOSPITAL_BASED_OUTPATIENT_CLINIC_OR_DEPARTMENT_OTHER): Payer: Medicare Other | Admitting: Internal Medicine

## 2012-01-26 ENCOUNTER — Other Ambulatory Visit (HOSPITAL_BASED_OUTPATIENT_CLINIC_OR_DEPARTMENT_OTHER): Payer: Medicare Other | Admitting: Lab

## 2012-01-26 ENCOUNTER — Telehealth: Payer: Self-pay | Admitting: Internal Medicine

## 2012-01-26 ENCOUNTER — Ambulatory Visit: Payer: PRIVATE HEALTH INSURANCE | Admitting: Internal Medicine

## 2012-01-26 ENCOUNTER — Other Ambulatory Visit: Payer: PRIVATE HEALTH INSURANCE

## 2012-01-26 VITALS — BP 96/54 | HR 58 | Temp 97.1°F | Ht 70.0 in | Wt 178.6 lb

## 2012-01-26 DIAGNOSIS — D693 Immune thrombocytopenic purpura: Secondary | ICD-10-CM

## 2012-01-26 LAB — CBC WITH DIFFERENTIAL/PLATELET
Basophils Absolute: 0 10*3/uL (ref 0.0–0.1)
EOS%: 5.4 % (ref 0.0–7.0)
HGB: 13.7 g/dL (ref 13.0–17.1)
MCH: 34.9 pg — ABNORMAL HIGH (ref 27.2–33.4)
MCHC: 34.8 g/dL (ref 32.0–36.0)
MCV: 100.2 fL — ABNORMAL HIGH (ref 79.3–98.0)
MONO%: 6.9 % (ref 0.0–14.0)
NEUT%: 55 % (ref 39.0–75.0)
RDW: 13.2 % (ref 11.0–14.6)

## 2012-01-26 LAB — COMPREHENSIVE METABOLIC PANEL
ALT: 31 U/L (ref 0–53)
AST: 43 U/L — ABNORMAL HIGH (ref 0–37)
Calcium: 8.4 mg/dL (ref 8.4–10.5)
Chloride: 103 mEq/L (ref 96–112)
Creatinine, Ser: 0.82 mg/dL (ref 0.50–1.35)
Potassium: 4.3 mEq/L (ref 3.5–5.3)
Sodium: 137 mEq/L (ref 135–145)
Total Protein: 6 g/dL (ref 6.0–8.3)

## 2012-01-26 NOTE — Progress Notes (Signed)
[facility] [phone]   [phone]  OFFICE PROGRESS NOTE  [name], MD [address] [place] [address]  DIAGNOSIS: Idiopathic thrombocytopenic purpura   PRIOR THERAPY: None   CURRENT THERAPY: Observation.  INTERVAL HISTORY:  [name] 71 y.o. male returns to the clinic today for routine six-month followup visit accompanied by his wife. The patient has no complaints today. He denied having any significant weight loss or night sweats. He has no bleeding issues, no bruises or ecchymosis. He has repeat CBC performed earlier today and he is here for evaluation and discussion of his lab results.  MEDICAL HISTORY: Past Medical History  Diagnosis Date  . Diabetes mellitus   . Pancytopenia   . Hiatal hernia 2011    EGD  . Diverticulosis of colon (without mention of hemorrhage) 2011    Colonoscopy   . Hypertension   . ITP (idiopathic thrombocytopenic purpura)     He has been diagnosed with  . CAD (coronary artery disease)     Hx of     ALLERGIES:  is allergic to crestor; lipitor; and zocor.  MEDICATIONS:  Current Outpatient Prescriptions  Medication Sig Dispense Refill  . aspirin 81 MG tablet Take 81 mg by mouth daily.        . clopidogrel (PLAVIX) 75 MG tablet Take 1 tablet (75 mg total) by mouth daily.  90 tablet  3  . isosorbide mononitrate (IMDUR) 60 MG 24 hr tablet Take one half tablet (30 mg) daily by mouth  30 tablet  11  . metFORMIN (GLUCOPHAGE) 500 MG tablet Take 500 mg by mouth 2 (two) times daily with a meal.        . metoprolol tartrate (LOPRESSOR) 25 MG tablet Take 1 tablet (25 mg total) by mouth 2 (two) times daily.  60 tablet  11  . Multiple Vitamin (MULTIVITAMIN) tablet Take 1 tablet by mouth daily.        . niacin 500 MG tablet Take 500 mg by mouth 3 (three) times daily with meals.        . nitroGLYCERIN (NITROSTAT) 0.4 MG SL tablet Place 0.4 mg under the tongue every 5 (five) minutes as needed.          . Omega-3 Fatty Acids (SEA-OMEGA PO) Take by mouth. Take 1200 mg by mouth twice daily       . Tamsulosin HCl (FLOMAX) 0.4 MG CAPS Take by mouth daily.        Marland Kitchen DISCONTD: carvedilol (COREG) 3.125 MG tablet Take 3.125 mg by mouth 2 (two) times daily with a meal.          SURGICAL HISTORY:  Past Surgical History  Procedure Date  . Hernia repair   . Coronary angioplasty with stent placement   . US echocardiography 02-08-2007    Est. EF 40-45%  . Coronary artery bypass graft     x4 with a left internal mammary artery anastomosis to the left anterior descending coronary artery   . Saphenous vein graft resection     graft to the first obtuse marginal, a saphenous vein graft to the first diagonal coronary artery  and a saphenous vein graft to the distal right coronary artery  . Cardiac catheterization     His last heart catheterization in May of 2009 reveals a patent LIMA  . Coronary stent placement     successful percutaneous transluminal coronary angioplasty and stenting of the left main coronary artery  .  Coronary angioplasty     Successful percutaneous transluminal coronary angioplasty of the left circumflex obuse marginal vessel    REVIEW OF SYSTEMS:  A comprehensive review of systems was negative.   PHYSICAL EXAMINATION: General appearance: alert, cooperative and no distress Neck: no adenopathy Lymph nodes: Cervical, supraclavicular, and axillary nodes normal. Resp: clear to auscultation bilaterally Cardio: regular rate and rhythm, S1, S2 normal, no murmur, click, rub or gallop GI: soft, non-tender; bowel sounds normal; no masses,  no organomegaly Extremities: extremities normal, atraumatic, no cyanosis or edema Neurologic: Alert and oriented X 3, normal strength and tone. Normal symmetric reflexes. Normal coordination and gait  ECOG PERFORMANCE STATUS: 0 - Asymptomatic  Blood pressure 96/54, pulse 58, temperature 97.1 F (36.2 C), temperature source Oral, height 5\' 10"  (1.778  m), weight 178 lb 9.6 oz (81.012 kg).  LABORATORY DATA: Lab Results  Component Value Date   WBC 4.9 01/26/2012   HGB 13.7 01/26/2012   HCT 39.4 01/26/2012   MCV 100.2* 01/26/2012   PLT 63* 01/26/2012      Chemistry      Component Value Date/Time   NA 135 10/15/2010 1136   K 3.9 10/15/2010 1136   CL 104 10/15/2010 1136   CO2 25 10/15/2010 1136   BUN 12 10/15/2010 1136   CREATININE 0.86 10/15/2010 1136      Component Value Date/Time   CALCIUM 8.5 10/15/2010 1136   ALKPHOS 76 03/24/2011 1457   AST 45* 03/24/2011 1457   ALT 39 03/24/2011 1457   BILITOT 1.3* 03/24/2011 1457       RADIOGRAPHIC STUDIES: No results found.  ASSESSMENT: This is a very pleasant 71 years old white male with idiopathic thrombocytopenic purpura currently on observation with no significant symptoms.  PLAN: His platelets count are stable. I will continue him on observation for now with repeat CBC and comprehensive metabolic panel in 6 months. He was advised to call me immediately she has any concerning symptoms in the interval especially any bleeding increased bruises or ecchymosis.  All questions were answered. The patient knows to call the clinic with any problems, questions or concerns. We can certainly see the patient much sooner if necessary.

## 2012-01-26 NOTE — Telephone Encounter (Signed)
appts me and printed for pt aom 

## 2012-02-18 ENCOUNTER — Other Ambulatory Visit: Payer: Self-pay | Admitting: *Deleted

## 2012-02-18 MED ORDER — ISOSORBIDE MONONITRATE ER 60 MG PO TB24: ORAL_TABLET | ORAL | Status: DC

## 2012-02-18 NOTE — Telephone Encounter (Signed)
Fax Received. Refill Completed. Tyriek Hofman Chowoe (R.M.A)   

## 2012-03-18 ENCOUNTER — Inpatient Hospital Stay (HOSPITAL_COMMUNITY)
Admission: EM | Admit: 2012-03-18 | Discharge: 2012-03-22 | DRG: 603 | Disposition: A | Discharge status: Home / Self Care | Payer: Medicare Other | Attending: Internal Medicine | Admitting: Internal Medicine

## 2012-03-18 ENCOUNTER — Encounter (HOSPITAL_COMMUNITY): Payer: Self-pay | Admitting: Family Medicine

## 2012-03-18 DIAGNOSIS — L03119 Cellulitis of unspecified part of limb: Principal | ICD-10-CM

## 2012-03-18 DIAGNOSIS — I509 Heart failure, unspecified: Secondary | ICD-10-CM

## 2012-03-18 DIAGNOSIS — D693 Immune thrombocytopenic purpura: Secondary | ICD-10-CM | POA: Diagnosis present

## 2012-03-18 DIAGNOSIS — I2510 Atherosclerotic heart disease of native coronary artery without angina pectoris: Secondary | ICD-10-CM | POA: Diagnosis present

## 2012-03-18 DIAGNOSIS — I10 Essential (primary) hypertension: Secondary | ICD-10-CM | POA: Diagnosis present

## 2012-03-18 DIAGNOSIS — E118 Type 2 diabetes mellitus with unspecified complications: Secondary | ICD-10-CM | POA: Diagnosis present

## 2012-03-18 DIAGNOSIS — Z951 Presence of aortocoronary bypass graft: Secondary | ICD-10-CM

## 2012-03-18 DIAGNOSIS — Z79899 Other long term (current) drug therapy: Secondary | ICD-10-CM

## 2012-03-18 DIAGNOSIS — E1165 Type 2 diabetes mellitus with hyperglycemia: Secondary | ICD-10-CM

## 2012-03-18 DIAGNOSIS — D61818 Other pancytopenia: Secondary | ICD-10-CM | POA: Diagnosis present

## 2012-03-18 DIAGNOSIS — I110 Hypertensive heart disease with heart failure: Secondary | ICD-10-CM | POA: Diagnosis present

## 2012-03-18 DIAGNOSIS — K5730 Diverticulosis of large intestine without perforation or abscess without bleeding: Secondary | ICD-10-CM

## 2012-03-18 DIAGNOSIS — I25118 Atherosclerotic heart disease of native coronary artery with other forms of angina pectoris: Secondary | ICD-10-CM | POA: Diagnosis present

## 2012-03-18 DIAGNOSIS — L0390 Cellulitis, unspecified: Secondary | ICD-10-CM | POA: Diagnosis present

## 2012-03-18 DIAGNOSIS — K449 Diaphragmatic hernia without obstruction or gangrene: Secondary | ICD-10-CM

## 2012-03-18 DIAGNOSIS — D509 Iron deficiency anemia, unspecified: Secondary | ICD-10-CM | POA: Diagnosis present

## 2012-03-18 DIAGNOSIS — E119 Type 2 diabetes mellitus without complications: Secondary | ICD-10-CM

## 2012-03-18 DIAGNOSIS — L03115 Cellulitis of right lower limb: Secondary | ICD-10-CM

## 2012-03-18 DIAGNOSIS — I951 Orthostatic hypotension: Secondary | ICD-10-CM

## 2012-03-18 DIAGNOSIS — Z9861 Coronary angioplasty status: Secondary | ICD-10-CM

## 2012-03-18 DIAGNOSIS — Z7982 Long term (current) use of aspirin: Secondary | ICD-10-CM

## 2012-03-18 DIAGNOSIS — IMO0002 Reserved for concepts with insufficient information to code with codable children: Secondary | ICD-10-CM | POA: Diagnosis present

## 2012-03-18 DIAGNOSIS — E876 Hypokalemia: Secondary | ICD-10-CM | POA: Diagnosis present

## 2012-03-18 DIAGNOSIS — D696 Thrombocytopenia, unspecified: Secondary | ICD-10-CM | POA: Diagnosis present

## 2012-03-18 DIAGNOSIS — R634 Abnormal weight loss: Secondary | ICD-10-CM

## 2012-03-18 DIAGNOSIS — L02419 Cutaneous abscess of limb, unspecified: Principal | ICD-10-CM | POA: Diagnosis present

## 2012-03-18 DIAGNOSIS — R509 Fever, unspecified: Secondary | ICD-10-CM

## 2012-03-18 DIAGNOSIS — R7401 Elevation of levels of liver transaminase levels: Secondary | ICD-10-CM

## 2012-03-18 LAB — CBC WITH DIFFERENTIAL/PLATELET
Basophils Absolute: 0 10*3/uL (ref 0.0–0.1)
Basophils Relative: 0 % (ref 0–1)
Eosinophils Absolute: 0.1 10*3/uL (ref 0.0–0.7)
Eosinophils Relative: 1 % (ref 0–5)
HCT: 36.5 % — ABNORMAL LOW (ref 39.0–52.0)
Hemoglobin: 12.9 g/dL — ABNORMAL LOW (ref 13.0–17.0)
Lymphocytes Relative: 12 % (ref 12–46)
Lymphs Abs: 1.1 10*3/uL (ref 0.7–4.0)
MCH: 34.5 pg — ABNORMAL HIGH (ref 26.0–34.0)
MCHC: 35.3 g/dL (ref 30.0–36.0)
MCV: 97.6 fL (ref 78.0–100.0)
Monocytes Absolute: 0.7 10*3/uL (ref 0.1–1.0)
Monocytes Relative: 8 % (ref 3–12)
Neutro Abs: 7.4 10*3/uL (ref 1.7–7.7)
Neutrophils Relative %: 79 % — ABNORMAL HIGH (ref 43–77)
Platelets: 89 10*3/uL — ABNORMAL LOW (ref 150–400)
RBC: 3.74 MIL/uL — ABNORMAL LOW (ref 4.22–5.81)
RDW: 12.8 % (ref 11.5–15.5)
WBC: 9.3 10*3/uL (ref 4.0–10.5)

## 2012-03-18 LAB — BASIC METABOLIC PANEL
BUN: 17 mg/dL (ref 6–23)
CO2: 22 mEq/L (ref 19–32)
Calcium: 9.6 mg/dL (ref 8.4–10.5)
Chloride: 105 mEq/L (ref 96–112)
Creatinine, Ser: 0.85 mg/dL (ref 0.50–1.35)
GFR calc Af Amer: >90 mL/min (ref >90)
GFR calc non Af Amer: 84 mL/min — ABNORMAL LOW (ref >90)
Glucose, Bld: 106 mg/dL — ABNORMAL HIGH (ref 70–99)
Potassium: 3.5 mEq/L (ref 3.5–5.1)
Sodium: 140 mEq/L (ref 135–145)

## 2012-03-18 LAB — GLUCOSE, CAPILLARY: Glucose-Capillary: 208 mg/dL — ABNORMAL HIGH (ref 70–99)

## 2012-03-18 MED ORDER — ASPIRIN EC 81 MG PO TBEC
81.0000 mg | DELAYED_RELEASE_TABLET | Freq: Every day | ORAL | Status: DC
  Administered 2012-03-19 – 2012-03-22 (×4): 81 mg via ORAL
  Filled 2012-03-18 (×4): qty 1

## 2012-03-18 MED ORDER — SODIUM CHLORIDE 0.9 % IV SOLN
INTRAVENOUS | Status: DC
  Administered 2012-03-18: 23:00:00 via INTRAVENOUS

## 2012-03-18 MED ORDER — VANCOMYCIN HCL IN DEXTROSE 1-5 GM/200ML-% IV SOLN
1000.0000 mg | Freq: Once | INTRAVENOUS | Status: AC
  Administered 2012-03-18: 1000 mg via INTRAVENOUS
  Filled 2012-03-18: qty 200

## 2012-03-18 MED ORDER — INSULIN ASPART 100 UNIT/ML ~~LOC~~ SOLN
0.0000 [IU] | Freq: Three times a day (TID) | SUBCUTANEOUS | Status: DC
  Administered 2012-03-19 (×3): 2 [IU] via SUBCUTANEOUS
  Administered 2012-03-20: 1 [IU] via SUBCUTANEOUS
  Administered 2012-03-20 (×2): 2 [IU] via SUBCUTANEOUS
  Administered 2012-03-21 – 2012-03-22 (×2): 1 [IU] via SUBCUTANEOUS

## 2012-03-18 MED ORDER — OMEGA-3-ACID ETHYL ESTERS 1 G PO CAPS
1.0000 g | ORAL_CAPSULE | Freq: Every day | ORAL | Status: DC
  Administered 2012-03-19 – 2012-03-22 (×4): 1 g via ORAL
  Filled 2012-03-18 (×4): qty 1

## 2012-03-18 MED ORDER — PIPERACILLIN-TAZOBACTAM 3.375 G IVPB
3.3750 g | Freq: Once | INTRAVENOUS | Status: AC
  Administered 2012-03-18: 3.375 g via INTRAVENOUS
  Filled 2012-03-18: qty 50

## 2012-03-18 MED ORDER — METFORMIN HCL 500 MG PO TABS
500.0000 mg | ORAL_TABLET | Freq: Two times a day (BID) | ORAL | Status: DC
  Administered 2012-03-19 – 2012-03-21 (×5): 500 mg via ORAL
  Filled 2012-03-18 (×8): qty 1

## 2012-03-18 MED ORDER — ADULT MULTIVITAMIN W/MINERALS CH
1.0000 | ORAL_TABLET | Freq: Every day | ORAL | Status: DC
  Administered 2012-03-19 – 2012-03-22 (×4): 1 via ORAL
  Filled 2012-03-18 (×4): qty 1

## 2012-03-18 MED ORDER — PIPERACILLIN-TAZOBACTAM 3.375 G IVPB
3.3750 g | Freq: Three times a day (TID) | INTRAVENOUS | Status: DC
  Administered 2012-03-19 – 2012-03-21 (×7): 3.375 g via INTRAVENOUS
  Filled 2012-03-18 (×10): qty 50

## 2012-03-18 MED ORDER — OXYCODONE HCL 5 MG PO TABS
5.0000 mg | ORAL_TABLET | ORAL | Status: DC | PRN
  Administered 2012-03-21 – 2012-03-22 (×2): 5 mg via ORAL
  Filled 2012-03-18 (×2): qty 1

## 2012-03-18 MED ORDER — CLOPIDOGREL BISULFATE 75 MG PO TABS
75.0000 mg | ORAL_TABLET | Freq: Every day | ORAL | Status: DC
  Administered 2012-03-19 – 2012-03-22 (×4): 75 mg via ORAL
  Filled 2012-03-18 (×6): qty 1

## 2012-03-18 MED ORDER — VANCOMYCIN HCL 1000 MG IV SOLR
750.0000 mg | Freq: Two times a day (BID) | INTRAVENOUS | Status: DC
  Administered 2012-03-19 – 2012-03-21 (×5): 750 mg via INTRAVENOUS
  Filled 2012-03-18 (×6): qty 750

## 2012-03-18 MED ORDER — PROMETHAZINE HCL 12.5 MG PO TABS
12.5000 mg | ORAL_TABLET | Freq: Four times a day (QID) | ORAL | Status: DC | PRN
  Filled 2012-03-18: qty 1

## 2012-03-18 MED ORDER — NIACIN 500 MG PO TABS
500.0000 mg | ORAL_TABLET | Freq: Three times a day (TID) | ORAL | Status: DC
  Administered 2012-03-19 – 2012-03-22 (×11): 500 mg via ORAL
  Filled 2012-03-18 (×14): qty 1

## 2012-03-18 MED ORDER — HEPARIN SODIUM (PORCINE) 5000 UNIT/ML IJ SOLN
5000.0000 [IU] | Freq: Three times a day (TID) | INTRAMUSCULAR | Status: DC
  Administered 2012-03-18 – 2012-03-19 (×3): 5000 [IU] via SUBCUTANEOUS
  Filled 2012-03-18 (×6): qty 1

## 2012-03-18 MED ORDER — ONDANSETRON HCL 4 MG/2ML IJ SOLN: 4.0000 mg | Freq: Three times a day (TID) | INTRAMUSCULAR | Status: DC | PRN

## 2012-03-18 MED ORDER — PIPERACILLIN-TAZOBACTAM 3.375 G IVPB
3.3750 g | Freq: Four times a day (QID) | INTRAVENOUS | Status: DC
  Filled 2012-03-18 (×2): qty 50

## 2012-03-18 MED ORDER — ISOSORBIDE MONONITRATE ER 30 MG PO TB24
30.0000 mg | ORAL_TABLET | Freq: Every day | ORAL | Status: DC
  Administered 2012-03-19 – 2012-03-22 (×4): 30 mg via ORAL
  Filled 2012-03-18 (×4): qty 1

## 2012-03-18 MED ORDER — RISAQUAD PO CAPS
2.0000 | ORAL_CAPSULE | Freq: Every day | ORAL | Status: DC
  Administered 2012-03-19 – 2012-03-22 (×4): 2 via ORAL
  Filled 2012-03-18 (×4): qty 2

## 2012-03-18 MED ORDER — NITROGLYCERIN 0.4 MG SL SUBL: 0.4000 mg | SUBLINGUAL_TABLET | SUBLINGUAL | Status: DC | PRN

## 2012-03-18 MED ORDER — FLAX PO OIL: 1.0000 | TOPICAL_OIL | Freq: Every day | ORAL | Status: DC

## 2012-03-18 MED ORDER — ONDANSETRON HCL 4 MG/2ML IJ SOLN
4.0000 mg | Freq: Once | INTRAMUSCULAR | Status: AC
  Administered 2012-03-18: 4 mg via INTRAVENOUS
  Filled 2012-03-18: qty 2

## 2012-03-18 MED ORDER — METOPROLOL TARTRATE 25 MG PO TABS
25.0000 mg | ORAL_TABLET | Freq: Two times a day (BID) | ORAL | Status: DC
  Administered 2012-03-18 – 2012-03-22 (×8): 25 mg via ORAL
  Filled 2012-03-18 (×10): qty 1

## 2012-03-18 MED ORDER — HYDROMORPHONE HCL PF 1 MG/ML IJ SOLN: 1.0000 mg | INTRAMUSCULAR | Status: DC | PRN

## 2012-03-18 MED ORDER — MORPHINE SULFATE 4 MG/ML IJ SOLN
4.0000 mg | Freq: Once | INTRAMUSCULAR | Status: AC
  Administered 2012-03-18: 4 mg via INTRAVENOUS
  Filled 2012-03-18: qty 1

## 2012-03-18 MED ORDER — OMEGA-3 FATTY ACIDS 1000 MG PO CAPS: 1.0000 g | ORAL_CAPSULE | Freq: Every day | ORAL | Status: DC

## 2012-03-18 MED ORDER — TAMSULOSIN HCL 0.4 MG PO CAPS
0.4000 mg | ORAL_CAPSULE | Freq: Every day | ORAL | Status: DC
  Administered 2012-03-19 – 2012-03-22 (×4): 0.4 mg via ORAL
  Filled 2012-03-18 (×4): qty 1

## 2012-03-18 MED ORDER — ACETAMINOPHEN 325 MG PO TABS
650.0000 mg | ORAL_TABLET | Freq: Once | ORAL | Status: AC
  Administered 2012-03-18: 650 mg via ORAL
  Filled 2012-03-18: qty 2

## 2012-03-18 NOTE — ED Provider Notes (Signed)
History    71 year old male with atraumatic right leg pain. Onset about 5 days ago. Patient was evaluated by his primary care physician and reports he had a ultrasound which was negative for DVT. He started on Keflex and has been taking since Tuesday. Symptoms have continued. Pt reports mild chronic swelling since vein harvest after CABG years ago but not to this degree. Red and warm to touch. Mild pain. No numbness, tingling or loss of strength. No fever or chills. Is a diabetic. Denies hx of DVT/PE. No CP or SOB.  CSN: [id number]  Arrival date & time 03/18/12  1151   First MD Initiated Contact with Patient 03/18/12 1503      Chief Complaint  Patient presents with  . Leg Pain    (Consider location/radiation/quality/duration/timing/severity/associated sxs/prior treatment) HPI  Past Medical History  Diagnosis Date  . Diabetes mellitus   . Pancytopenia   . Hiatal hernia 2011    EGD  . Diverticulosis of colon (without mention of hemorrhage) 2011    Colonoscopy   . Hypertension   . ITP (idiopathic thrombocytopenic purpura)     He has been diagnosed with  . CAD (coronary artery disease)     Hx of     Past Surgical History  Procedure Date  . Hernia repair   . Coronary angioplasty with stent placement   . US echocardiography 02-08-2007    Est. EF 40-45%  . Coronary artery bypass graft     x4 with a left internal mammary artery anastomosis to the left anterior descending coronary artery   . Saphenous vein graft resection     graft to the first obtuse marginal, a saphenous vein graft to the first diagonal coronary artery  and a saphenous vein graft to the distal right coronary artery  . Cardiac catheterization     His last heart catheterization in May of 2009 reveals a patent LIMA  . Coronary stent placement     successful percutaneous transluminal coronary angioplasty and stenting of the left main coronary artery  . Coronary angioplasty     Successful percutaneous transluminal  coronary angioplasty of the left circumflex obuse marginal vessel    Family History  Problem Relation Age of Onset  . Colon cancer Neg Hx     History  Substance Use Topics  . Smoking status: Never Smoker   . Smokeless tobacco: Never Used  . Alcohol Use: No      Review of Systems   Review of symptoms negative unless otherwise noted in HPI.   Allergies  Crestor; Lipitor; and Zocor  Home Medications   Current Outpatient Rx  Name Route Sig Dispense Refill  . ASPIRIN 81 MG PO TABS Oral Take 81 mg by mouth daily.      . CEPHALEXIN 500 MG PO CAPS Oral Take 500 mg by mouth 2 (two) times daily. 10 day supply started 02-13-12 for infection    . CLOPIDOGREL BISULFATE 75 MG PO TABS Oral Take 75 mg by mouth daily.    . OMEGA-3 FATTY ACIDS 1000 MG PO CAPS Oral Take 1 g by mouth daily.    Marland Kitchen FLAX PO OIL Oral Take 1 tablet by mouth daily.    . IBUPROFEN 800 MG PO TABS Oral Take 800 mg by mouth every 8 (eight) hours as needed. pain    . ISOSORBIDE MONONITRATE ER 60 MG PO TB24 Oral Take 30 mg by mouth daily. Take one half tablet (30 mg) daily by mouth    . METFORMIN  HCL 500 MG PO TABS Oral Take 500 mg by mouth 2 (two) times daily with a meal.     . METOPROLOL TARTRATE 25 MG PO TABS Oral Take 25 mg by mouth 2 (two) times daily.    Marland Kitchen ONE-DAILY MULTI VITAMINS PO TABS Oral Take 1 tablet by mouth daily.      Marland Kitchen NIACIN 500 MG PO TABS Oral Take 500 mg by mouth 3 (three) times daily with meals.      . TAMSULOSIN HCL 0.4 MG PO CAPS Oral Take by mouth daily.      Marland Kitchen NITROGLYCERIN 0.4 MG SL SUBL Sublingual Place 0.4 mg under the tongue every 5 (five) minutes as needed.        BP 124/60  Pulse 73  Temp 98.7 F (37.1 C) (Oral)  Resp 16  SpO2 98%  Physical Exam  Nursing note and vitals reviewed. Constitutional: He appears well-developed and well-nourished. No distress.  HENT:  Head: Normocephalic and atraumatic.  Eyes: Conjunctivae are normal. Right eye exhibits no discharge. Left eye exhibits no  discharge.  Neck: Neck supple.  Cardiovascular: Normal rate, regular rhythm and normal heart sounds.  Exam reveals no gallop and no friction rub.   No murmur heard. Pulmonary/Chest: Effort normal and breath sounds normal. No respiratory distress. He has no wheezes.  Abdominal: Soft. He exhibits no distension. There is no tenderness.  Musculoskeletal: He exhibits no edema and no tenderness.       Pitting edema of the right lower extremity from the knee distally. Erythema, increased warmth and tenderness to touch over the distal shin. Good DP pulses.    Neurological: He is alert.  Skin: Skin is warm and dry.  Psychiatric: He has a normal mood and affect. His behavior is normal. Thought content normal.    ED Course  Procedures (including critical care time)  Labs Reviewed  CBC WITH DIFFERENTIAL - Abnormal; Notable for the following:    RBC 3.74 (*)     Hemoglobin 12.9 (*)     HCT 36.5 (*)     MCH 34.5 (*)     Platelets 89 (*)     Neutrophils Relative 79 (*)     All other components within normal limits  BASIC METABOLIC PANEL - Abnormal; Notable for the following:    Glucose, Bld 106 (*)     GFR calc non Af Amer 84 (*)     All other components within normal limits  GLUCOSE, CAPILLARY - Abnormal; Notable for the following:    Glucose-Capillary 208 (*)     All other components within normal limits  CBC - Abnormal; Notable for the following:    RBC 3.38 (*)     Hemoglobin 11.5 (*)     HCT 32.6 (*)     Platelets 97 (*)  CONSISTENT WITH PREVIOUS RESULT   All other components within normal limits  BASIC METABOLIC PANEL - Abnormal; Notable for the following:    Glucose, Bld 139 (*)     GFR calc non Af Amer 87 (*)     All other components within normal limits  GLUCOSE, CAPILLARY - Abnormal; Notable for the following:    Glucose-Capillary 159 (*)     All other components within normal limits  GLUCOSE, CAPILLARY - Abnormal; Notable for the following:    Glucose-Capillary 182 (*)      All other components within normal limits  GLUCOSE, CAPILLARY - Abnormal; Notable for the following:    Glucose-Capillary 185 (*)  All other components within normal limits  GLUCOSE, CAPILLARY - Abnormal; Notable for the following:    Glucose-Capillary 158 (*)     All other components within normal limits  GLUCOSE, CAPILLARY - Abnormal; Notable for the following:    Glucose-Capillary 131 (*)     All other components within normal limits  GLUCOSE, CAPILLARY - Abnormal; Notable for the following:    Glucose-Capillary 152 (*)     All other components within normal limits  GLUCOSE, CAPILLARY - Abnormal; Notable for the following:    Glucose-Capillary 156 (*)     All other components within normal limits  CBC - Abnormal; Notable for the following:    RBC 3.23 (*)     Hemoglobin 11.0 (*)     HCT 30.8 (*)     MCH 34.1 (*)     Platelets 110 (*)  CONSISTENT WITH PREVIOUS RESULT   All other components within normal limits  BASIC METABOLIC PANEL - Abnormal; Notable for the following:    Potassium 3.1 (*)     Glucose, Bld 112 (*)     GFR calc non Af Amer 89 (*)     All other components within normal limits  VITAMIN B12 - Abnormal; Notable for the following:    Vitamin B-12 1500 (*)     All other components within normal limits  IRON AND TIBC - Abnormal; Notable for the following:    Iron 26 (*)     TIBC 189 (*)     Saturation Ratios 14 (*)     All other components within normal limits  RETICULOCYTES - Abnormal; Notable for the following:    RBC. 3.23 (*)     All other components within normal limits  GLUCOSE, CAPILLARY - Abnormal; Notable for the following:    Glucose-Capillary 117 (*)     All other components within normal limits  GLUCOSE, CAPILLARY - Abnormal; Notable for the following:    Glucose-Capillary 118 (*)     All other components within normal limits  GLUCOSE, CAPILLARY - Abnormal; Notable for the following:    Glucose-Capillary 108 (*)     All other components  within normal limits  CBC - Abnormal; Notable for the following:    RBC 3.29 (*)     Hemoglobin 11.2 (*)     HCT 31.3 (*)     Platelets 127 (*)     All other components within normal limits  BASIC METABOLIC PANEL - Abnormal; Notable for the following:    Glucose, Bld 106 (*)     GFR calc non Af Amer 85 (*)     All other components within normal limits  GLUCOSE, CAPILLARY - Abnormal; Notable for the following:    Glucose-Capillary 132 (*)     All other components within normal limits  GLUCOSE, CAPILLARY - Abnormal; Notable for the following:    Glucose-Capillary 140 (*)     All other components within normal limits  GLUCOSE, CAPILLARY - Abnormal; Notable for the following:    Glucose-Capillary 114 (*)     All other components within normal limits  GLUCOSE, CAPILLARY - Abnormal; Notable for the following:    Glucose-Capillary 145 (*)     All other components within normal limits  FOLATE  FERRITIN  MAGNESIUM  LAB REPORT - SCANNED   No results found.   1. Cellulitis of right leg       MDM  71 year old male with exam consistent with a cellulitis of his right lower extremity.  Consider DVT but feel this is less likely. Doubt superficial phlebitis.  Recent ultrasound which is negative. Will keep in the ED for couple doses of IV antibiotics. If patient does not seem to be improving, will admit.       Raeford Razor, MD 03/24/12 [phone]

## 2012-03-18 NOTE — Progress Notes (Signed)
PHARMACIST - PHYSICIAN ORDER COMMUNICATION  CONCERNING: P&T Medication Policy on Herbal Medications  DESCRIPTION:  This patient's order for:  Flax Oil  has been noted.  This product(s) is classified as an "herbal" or natural product. Due to a lack of definitive safety studies or FDA approval, nonstandard manufacturing practices, plus the potential risk of unknown drug-drug interactions while on inpatient medications, the Pharmacy and Therapeutics Committee does not permit the use of "herbal" or natural products of this type within [facility].   ACTION TAKEN: The pharmacy department is unable to verify this order at this time and your patient has been informed of this safety policy. Please reevaluate patient's clinical condition at discharge and address if the herbal or natural product(s) should be resumed at that time.

## 2012-03-18 NOTE — ED Notes (Signed)
Alert, NAD, calm, interactive, skin W&D, resps e/u, speaking in clear complete sentences, family at [facility] x2. Pt & family updated. RLE red, hot swollen, painful, CMS intact, pulses palpable, pitting edema noted. Rates pain in R ankle 3/10, cbg obtained.

## 2012-03-18 NOTE — Progress Notes (Signed)
ANTIBIOTIC CONSULT NOTE - INITIAL  Pharmacy Consult for vancomycin Indication: cellulitis  Allergies  Allergen Reactions  . Crestor (Rosuvastatin Calcium)     Causes Muscle Pain  . Lipitor (Atorvastatin Calcium)     Causes memory loss  . Zocor (Simvastatin)     Causes muscle pain    Patient Measurements:    Vital Signs: Temp: 100.5 F (38.1 C) (09/06 2112) Temp src: Oral (09/06 2112) BP: 124/63 mmHg (09/06 1900) Pulse Rate: 99  (09/06 1900) Intake/Output from previous day:   Intake/Output from this shift: Total I/O In: -  Out: 550 [Urine:550]  Labs:  [facility] 03/18/12 1846  WBC 9.3  HGB 12.9*  PLT 89*  LABCREA --  CREATININE 0.85   The CrCl is unknown because both a height and weight (above a minimum accepted value) are required for this calculation. No results found for this basename: VANCOTROUGH:2,VANCOPEAK:2,VANCORANDOM:2,GENTTROUGH:2,GENTPEAK:2,GENTRANDOM:2,TOBRATROUGH:2,TOBRAPEAK:2,TOBRARND:2,AMIKACINPEAK:2,AMIKACINTROU:2,AMIKACIN:2, in the last 72 hours   Microbiology: No results found for this or any previous visit (from the past 720 hour(s)).  Medical History: Past Medical History  Diagnosis Date  . Diabetes mellitus   . Pancytopenia   . Hiatal hernia 2011    EGD  . Diverticulosis of colon (without mention of hemorrhage) 2011    Colonoscopy   . Hypertension   . ITP (idiopathic thrombocytopenic purpura)     He has been diagnosed with  . CAD (coronary artery disease)     Hx of     Medications:  Scheduled:    . acetaminophen  650 mg Oral Once  . acidophilus  2 capsule Oral Daily  . aspirin  81 mg Oral Daily  . clopidogrel  75 mg Oral Daily  . fish oil-omega-3 fatty acids  1 g Oral Daily  . Flax  1 tablet Oral Daily  . heparin  5,000 Units Subcutaneous Q8H  . insulin aspart  0-9 Units Subcutaneous TID WC  . isosorbide mononitrate  30 mg Oral Daily  . metFORMIN  500 mg Oral BID WC  . metoprolol tartrate  25 mg Oral BID  .  morphine  injection  4 mg Intravenous Once  . multivitamin  1 tablet Oral Daily  . niacin  500 mg Oral TID WC  . ondansetron (ZOFRAN) IV  4 mg Intravenous Once  . piperacillin-tazobactam (ZOSYN)  IV  3.375 g Intravenous Once  . piperacillin-tazobactam (ZOSYN)  IV  3.375 g Intravenous Q6H  . Tamsulosin HCl  0.4 mg Oral Daily  . vancomycin  1,000 mg Intravenous Once   Assessment: 71 yo male admitted with possible cellulitis of RLE. Pharmacy consulted to manage vancomycin. Patient is also to receive Zosyn. Patient has already received vancomycin 1gm IV x 1 at ~16:00.   Goal of Therapy:  Vancomycin trough level 10-15 mcg/ml  Plan:  1. Vancomycin 750mg  IV Q12H.   Emeline Gins 03/18/2012,10:20 PM

## 2012-03-18 NOTE — ED Notes (Signed)
C/o RLE pain started last Friday night, then edema began Saturday. Had negative doppler study for DVT on Tuesday. RLE edema & pain continues to worsen. Leg red & very warm to touch. Reports had fever last Sat.

## 2012-03-18 NOTE — ED Notes (Signed)
Pt complaining of RLE pain, swelling, and redness. sts also just doesn't feel well. Pt significant swelling and redness to RLE

## 2012-03-19 DIAGNOSIS — L0390 Cellulitis, unspecified: Secondary | ICD-10-CM | POA: Diagnosis present

## 2012-03-19 DIAGNOSIS — R509 Fever, unspecified: Secondary | ICD-10-CM

## 2012-03-19 HISTORY — DX: Cellulitis, unspecified: L03.90

## 2012-03-19 LAB — BASIC METABOLIC PANEL
Calcium: 9 mg/dL (ref 8.4–10.5)
GFR calc Af Amer: >90 mL/min (ref >90)
GFR calc non Af Amer: 87 mL/min — ABNORMAL LOW (ref >90)
Sodium: 140 mEq/L (ref 135–145)

## 2012-03-19 LAB — CBC
MCH: 34 pg (ref 26.0–34.0)
MCHC: 35.3 g/dL (ref 30.0–36.0)
Platelets: 97 10*3/uL — ABNORMAL LOW (ref 150–400)
RBC: 3.38 MIL/uL — ABNORMAL LOW (ref 4.22–5.81)
RDW: 12.6 % (ref 11.5–15.5)

## 2012-03-19 LAB — GLUCOSE, CAPILLARY
Glucose-Capillary: 159 mg/dL — ABNORMAL HIGH (ref 70–99)
Glucose-Capillary: 182 mg/dL — ABNORMAL HIGH (ref 70–99)

## 2012-03-19 MED ORDER — ACETAMINOPHEN 325 MG PO TABS: 650.0000 mg | ORAL_TABLET | Freq: Four times a day (QID) | ORAL | Status: DC | PRN

## 2012-03-19 MED ORDER — ENOXAPARIN SODIUM 80 MG/0.8ML ~~LOC~~ SOLN
75.0000 mg | Freq: Two times a day (BID) | SUBCUTANEOUS | Status: DC
  Administered 2012-03-19 – 2012-03-20 (×2): 75 mg via SUBCUTANEOUS
  Filled 2012-03-19 (×5): qty 0.8

## 2012-03-19 NOTE — H&P (Signed)
[facility]                                                                                    Patient Demographics  [name], is a 71 y.o. male  CSN: [id number]  MRN: [id number]  DOB - [birth year]  Admit Date - 03/18/2012  Outpatient Primary MD for the patient is [name], MD   With History of -  Past Medical History  Diagnosis Date  . Diabetes mellitus   . Pancytopenia   . Hiatal hernia 2011    EGD  . Diverticulosis of colon (without mention of hemorrhage) 2011    Colonoscopy   . Hypertension   . ITP (idiopathic thrombocytopenic purpura)     He has been diagnosed with  . CAD (coronary artery disease)     Hx of       Past Surgical History  Procedure Date  . Hernia repair   . Coronary angioplasty with stent placement   . US echocardiography 02-08-2007    Est. EF 40-45%  . Coronary artery bypass graft     x4 with a left internal mammary artery anastomosis to the left anterior descending coronary artery   . Saphenous vein graft resection     graft to the first obtuse marginal, a saphenous vein graft to the first diagonal coronary artery  and a saphenous vein graft to the distal right coronary artery  . Cardiac catheterization     His last heart catheterization in May of 2009 reveals a patent LIMA  . Coronary stent placement     successful percutaneous transluminal coronary angioplasty and stenting of the left main coronary artery  . Coronary angioplasty     Successful percutaneous transluminal coronary angioplasty of the left circumflex obuse marginal vessel    in for   Chief Complaint  Patient presents with  . Leg Pain     HPI  [name]  is a 71 y.o. male, with above-mentioned past medical history, presented initially to his PCP on Tuesday complaining of right lower extremity swelling edema and erythema, a patient reports he had ultrasound done which came back negative for DVT, who was diagnosed with cellulitis and was  started on cephalexin as an outpatient, patient reports he has been compliant with his antibiotics, but reports worsening of symptoms, which prompted him to come to ED, patient was then complaining of fever and feeling feverish, had temperature at 100.5, complaining of generalized weakness loss of appetite and fatigue, patient was started on IV vancomycin and Zosyn in ED.    Review of Systems    In addition to the HPI above,  Complaints of Fever-chills, No Headache, No changes with Vision or hearing, No problems swallowing food or Liquids, No Chest pain, Cough or Shortness of Breath, No Abdominal pain, No Nausea or Vommitting, Bowel movements are regular, No Blood in stool or Urine, No dysuria, Right lower extremity edema, rash, erythema No new joints pains-aches,  No new weakness, tingling, numbness in any extremity, No recent weight gain or loss, No polyuria, polydypsia or polyphagia, No significant Mental Stressors.  A full 10 point Review of Systems was done, except as stated  above, all other Review of Systems were negative.   Social History History  Substance Use Topics  . Smoking status: Never Smoker   . Smokeless tobacco: Never Used  . Alcohol Use: No     Family History Family History  Problem Relation Age of Onset  . Colon cancer Neg Hx      Prior to Admission medications   Medication Sig Start Date End Date Taking? Authorizing Provider  aspirin 81 MG tablet Take 81 mg by mouth daily.     Yes Historical Provider, MD  cephALEXin (KEFLEX) 500 MG capsule Take 500 mg by mouth 2 (two) times daily. 10 day supply started 02-13-12 for infection   Yes Historical Provider, MD  clopidogrel (PLAVIX) 75 MG tablet Take 75 mg by mouth daily. 10/23/11  Yes Vesta Mixer, MD  fish oil-omega-3 fatty acids 1000 MG capsule Take 1 g by mouth daily.   Yes Historical Provider, MD  Flax OIL Take 1 tablet by mouth daily.   Yes Historical Provider, MD  ibuprofen (ADVIL,MOTRIN) 800 MG  tablet Take 800 mg by mouth every 8 (eight) hours as needed. pain   Yes Historical Provider, MD  isosorbide mononitrate (IMDUR) 60 MG 24 hr tablet Take 30 mg by mouth daily. Take one half tablet (30 mg) daily by mouth 02/18/12  Yes Vesta Mixer, MD  metFORMIN (GLUCOPHAGE) 500 MG tablet Take 500 mg by mouth 2 (two) times daily with a meal.    Yes Historical Provider, MD  metoprolol tartrate (LOPRESSOR) 25 MG tablet Take 25 mg by mouth 2 (two) times daily. 09/29/11 09/28/12 Yes Vesta Mixer, MD  Multiple Vitamin (MULTIVITAMIN) tablet Take 1 tablet by mouth daily.     Yes Historical Provider, MD  niacin 500 MG tablet Take 500 mg by mouth 3 (three) times daily with meals.     Yes Historical Provider, MD  Tamsulosin HCl (FLOMAX) 0.4 MG CAPS Take by mouth daily.     Yes Historical Provider, MD  nitroGLYCERIN (NITROSTAT) 0.4 MG SL tablet Place 0.4 mg under the tongue every 5 (five) minutes as needed.      Historical Provider, MD    Allergies  Allergen Reactions  . Crestor (Rosuvastatin Calcium)     Causes Muscle Pain  . Lipitor (Atorvastatin Calcium)     Causes memory loss  . Zocor (Simvastatin)     Causes muscle pain    Physical Exam  Vitals  Blood pressure 106/61, pulse 76, temperature 99.5 F (37.5 C), temperature source Oral, resp. rate 18, height 5\' 10"  (1.778 m), weight 76.233 kg (168 lb 1 oz), SpO2 98.00%.   1. General elderly male lying in bed in NAD,   2. Normal affect and insight, Not Suicidal or Homicidal, Awake Alert, Oriented X 3.  3. No F.N deficits, ALL C.Nerves Intact, Strength 5/5 all 4 extremities, Sensation intact all 4 extremities, Plantars down going.  4. Ears and Eyes appear Normal, Conjunctivae clear, PERRLA. Moist Oral Mucosa.  5. Supple Neck, No JVD, No cervical lymphadenopathy appriciated, No Carotid Bruits.  6. Symmetrical Chest wall movement, Good air movement bilaterally, CTAB.  7. RRR, No Gallops, Rubs or Murmurs, No Parasternal Heave.  8. Positive  Bowel Sounds, Abdomen Soft, Non tender, No organomegaly appriciated,No rebound -guarding or rigidity.  9.  right lower extremity edema, erythema, tenderness, has good pulses and good capillary  10. Good muscle tone,  joints appear normal , no effusions, Normal ROM.  11. No Palpable Lymph Nodes in Neck or  Axillae    Data Review  CBC  Lab 03/18/12 1846  WBC 9.3  HGB 12.9*  HCT 36.5*  PLT 89*  MCV 97.6  MCH 34.5*  MCHC 35.3  RDW 12.8  LYMPHSABS 1.1  MONOABS 0.7  EOSABS 0.1  BASOSABS 0.0  BANDABS --   ------------------------------------------------------------------------------------------------------------------  Chemistries   Lab 03/18/12 1846  NA 140  K 3.5  CL 105  CO2 22  GLUCOSE 106*  BUN 17  CREATININE 0.85  CALCIUM 9.6  MG --  AST --  ALT --  ALKPHOS --  BILITOT --   ------------------------------------------------------------------------------------------------------------------ estimated creatinine clearance is 79.9 ml/min (by C-G formula based on Cr of 0.85). ------------------------------------------------------------------------------------------------------------------ No results found for this basename: TSH,T4TOTAL,FREET3,T3FREE,THYROIDAB in the last 72 hours   Coagulation profile No results found for this basename: INR:5,PROTIME:5 in the last 168 hours ------------------------------------------------------------------------------------------------------------------- No results found for this basename: DDIMER:2 in the last 72 hours -------------------------------------------------------------------------------------------------------------------  Cardiac Enzymes No results found for this basename: CK:3,CKMB:3,TROPONINI:3,MYOGLOBIN:3 in the last 168 hours ------------------------------------------------------------------------------------------------------------------ No components found with this basename:  POCBNP:3   ---------------------------------------------------------------------------------------------------------------  Urinalysis    Component Value Date/Time   COLORURINE YELLOW 02/12/2010 1055   APPEARANCEUR CLEAR 02/12/2010 1055   LABSPEC 1.015 02/12/2010 1055   PHURINE 5.5 02/12/2010 1055   BILIRUBINUR NEGATIVE 02/12/2010 1055   KETONESUR NEGATIVE 02/12/2010 1055   UROBILINOGEN 1.0 02/12/2010 1055   NITRITE NEGATIVE 02/12/2010 1055   LEUKOCYTESUR NEGATIVE 02/12/2010 1055    ----------------------------------------------------------------------------------------------------------------   Assessment & Plan  Principal Problem:  *Cellulitis Active Problems:  DIABETES MELLITUS-TYPE II  CAD  HYPERTENSION    1. right lower extremity cellulitis, patient failed out patient antibiotics, will be admitted for IV antibiotics, had negative DVT as an outpatient last Tuesday as per patient, we'll continue him on vancomycin and Zosyn,   2. diabetes mellitus, will hold oral hypoglycemic agents, and we'll continue him on insulin sliding scale  3. carotid disease, patient has no complaints of chest pain or shortness of breath, we'll continue him on aspirin, Plavix, beta blockers, Imdur.  4. hypertension, appears to be well controlled, we'll continue him on home medication    DVT Prophylaxis Heparin AM Labs Ordered, also please review Full Orders  Family Communication: Admission, patients condition and plan of care including tests being ordered have been discussed with the patient and family at bedside who indicate understanding and agree with the plan and Code Status.  Code Status full  Disposition Plan:home  Time spent in minutes :  Condition GUARDED  Sharief Wainwright M.D on 03/19/2012 at 2:20 AM  s  [facility] Office  [phone]

## 2012-03-19 NOTE — Progress Notes (Signed)
Subjective: Right leg still quite swollen and erythematous.  Patient sitting by the bedside eating food.  Objective: Vital signs in last 24 hours: Filed Vitals:   03/18/12 2112 03/18/12 2326 03/19/12 0640 03/19/12 1300  BP:  106/61 99/63 98/57   Pulse:  76 78 78  Temp: 100.5 F (38.1 C) 99.5 F (37.5 C) 99.9 F (37.7 C) 98.2 F (36.8 C)  TempSrc: Oral Oral Oral   Resp:  18 18 18   Height:  5\' 10"  (1.778 m)    Weight:  76.233 kg (168 lb 1 oz)    SpO2:  98% 95% 93%   Weight change:   Intake/Output Summary (Last 24 hours) at 03/19/12 1601 Last data filed at 03/19/12 0900  Gross per 24 hour  Intake    685 ml  Output   1075 ml  Net   -390 ml    Physical Exam: General: Awake, Oriented, No acute distress. HEENT: EOMI. Neck: Supple CV: S1 and S2 Lungs: Clear to ascultation bilaterally Abdomen: Soft, Nontender, Nondistended, +bowel sounds. Ext: Good pulses.  Right lower extremity swollen, erythematous all the way up to the knee.  Lab Results: Basic Metabolic Panel:  Lab 03/19/12 4782 03/18/12 1846  NA 140 140  K 3.5 3.5  CL 104 105  CO2 27 22  GLUCOSE 139* 106*  BUN 13 17  CREATININE 0.79 0.85  CALCIUM 9.0 9.6  MG -- --  PHOS -- --   Liver Function Tests: No results found for this basename: AST:5,ALT:5,ALKPHOS:5,BILITOT:5,PROT:5,ALBUMIN:5 in the last 168 hours No results found for this basename: LIPASE:5,AMYLASE:5 in the last 168 hours No results found for this basename: AMMONIA:5 in the last 168 hours CBC:  Lab 03/19/12 0555 03/18/12 1846  WBC 7.4 9.3  NEUTROABS -- 7.4  HGB 11.5* 12.9*  HCT 32.6* 36.5*  MCV 96.4 97.6  PLT 97* 89*   Cardiac Enzymes: No results found for this basename: CKTOTAL:5,CKMB:5,CKMBINDEX:5,TROPONINI:5 in the last 168 hours BNP (last 3 results) No results found for this basename: PROBNP:3 in the last 8760 hours CBG:  Lab 03/19/12 1154 03/19/12 0800 03/18/12 1934  GLUCAP 182* 159* 208*   No results found for this basename:  HGBA1C:5 in the last 72 hours Other Labs: No components found with this basename: POCBNP:3 No results found for this basename: DDIMER:2 in the last 168 hours No results found for this basename: CHOL:2,HDL:2,LDLCALC:2,TRIG:2,CHOLHDL:2,LDLDIRECT:2 in the last 168 hours No results found for this basename: TSH,T4TOTAL,FREET3,T3FREE,FREET4,THYROIDAB in the last 168 hours No results found for this basename: VITAMINB12:2,FOLATE:2,FERRITIN:2,TIBC:2,IRON:2,RETICCTPCT:2 in the last 168 hours  Micro Results: No results found for this or any previous visit (from the past 240 hour(s)).  Studies/Results: No results found.  Medications: I have reviewed the patient's current medications. Scheduled Meds:   . acetaminophen  650 mg Oral Once  . acidophilus  2 capsule Oral Daily  . aspirin EC  81 mg Oral Daily  . clopidogrel  75 mg Oral Daily  . enoxaparin (LOVENOX) injection  1 mg/kg Subcutaneous Q12H  . insulin aspart  0-9 Units Subcutaneous TID WC  . isosorbide mononitrate  30 mg Oral Daily  . metFORMIN  500 mg Oral BID WC  . metoprolol tartrate  25 mg Oral BID  . multivitamin with minerals  1 tablet Oral Daily  . niacin  500 mg Oral TID WC  . omega-3 acid ethyl esters  1 g Oral Daily  . piperacillin-tazobactam (ZOSYN)  IV  3.375 g Intravenous Once  . piperacillin-tazobactam (ZOSYN)  IV  3.375 g  Intravenous Q8H  . Tamsulosin HCl  0.4 mg Oral Daily  . vancomycin  750 mg Intravenous Q12H  . vancomycin  1,000 mg Intravenous Once  . DISCONTD: fish oil-omega-3 fatty acids  1 g Oral Daily  . DISCONTD: Flax  1 tablet Oral Daily  . DISCONTD: heparin  5,000 Units Subcutaneous Q8H  . DISCONTD: piperacillin-tazobactam (ZOSYN)  IV  3.375 g Intravenous Q6H   Continuous Infusions:   . DISCONTD: sodium chloride Stopped (03/19/12 0214)   PRN Meds:.nitroGLYCERIN, oxyCODONE, promethazine, DISCONTD:  HYDROmorphone (DILAUDID) injection, DISCONTD: ondansetron (ZOFRAN) IV  Assessment/Plan: Right lower  extremity cellulitis Patient has failed outpatient treatment with Keflex.  Continue vancomycin and Zosyn, antibiotic since 03/18/2012.  Though the patient had lower extremity Dopplers on 03/15/2012, will check lower extremity Dopplers to evaluate for possible DVT.  Will have the patient on therapeutic Lovenox until ruled out for DVT.  Hypertension Continue metoprolol and isosorbide mononitrate.  Type 2 diabetes uncontrolled with complications Continue to hold metformin.  Sliding scale insulin.  Coronary artery disease Continue aspirin and Plavix, beta blocker, and isosorbide mononitrate.  Anemia Send for anemia panel in the morning.  Thrombocytopenia Appears to be chronic, has seen Dr. Arbutus Ped hematology and is thought to have idiopathic thrombocytopenic purpura, currently managed conservatively.  Prophylaxis Lovenox, on therapeutic dose until ruled out for DVT.  CODE STATUS Full code.  Disposition Pending improvement in cellulitis.   LOS: 1 day  Adessa Primiano A, MD 03/19/2012, 4:01 PM

## 2012-03-19 NOTE — Progress Notes (Signed)
Pt admitted to unit. A&O and stable VS. Pt was oriented to unit & is resting comfortably.

## 2012-03-20 DIAGNOSIS — M7989 Other specified soft tissue disorders: Secondary | ICD-10-CM

## 2012-03-20 DIAGNOSIS — D693 Immune thrombocytopenic purpura: Secondary | ICD-10-CM

## 2012-03-20 DIAGNOSIS — E1165 Type 2 diabetes mellitus with hyperglycemia: Secondary | ICD-10-CM

## 2012-03-20 LAB — GLUCOSE, CAPILLARY: Glucose-Capillary: 156 mg/dL — ABNORMAL HIGH (ref 70–99)

## 2012-03-20 MED ORDER — ENOXAPARIN SODIUM 40 MG/0.4ML ~~LOC~~ SOLN
40.0000 mg | Freq: Every day | SUBCUTANEOUS | Status: DC
  Administered 2012-03-20 – 2012-03-22 (×3): 40 mg via SUBCUTANEOUS
  Filled 2012-03-20 (×3): qty 0.4

## 2012-03-20 NOTE — Progress Notes (Signed)
VASCULAR LAB PRELIMINARY  PRELIMINARY  PRELIMINARY  PRELIMINARY  Right lower extremity venous Doppler completed.    Preliminary report:  There is no DVT or SVT noted in the right lower extremity.  Shanah Guimaraes, 03/20/2012, 10:09 AM

## 2012-03-20 NOTE — Progress Notes (Signed)
Subjective: Right leg still quite swollen and erythematous, but improved from yesterday.  Had fever last night.  Objective: Vital signs in last 24 hours: Filed Vitals:   03/19/12 0640 03/19/12 1300 03/19/12 2146 03/20/12 0427  BP: 99/63 98/57 111/58 136/76  Pulse: 78 78 68 59  Temp: 99.9 F (37.7 C) 98.2 F (36.8 C) 101.4 F (38.6 C) 98.3 F (36.8 C)  TempSrc: Oral  Oral Oral  Resp: 18 18 17 17   Height:      Weight:      SpO2: 95% 93% 93% 96%   Weight change:   Intake/Output Summary (Last 24 hours) at 03/20/12 1423 Last data filed at 03/20/12 1300  Gross per 24 hour  Intake   1240 ml  Output   1825 ml  Net   -585 ml    Physical Exam: General: Awake, Oriented, No acute distress. HEENT: EOMI. Neck: Supple CV: S1 and S2 Lungs: Clear to ascultation bilaterally Abdomen: Soft, Nontender, Nondistended, +bowel sounds. Ext: Good pulses.  Right lower extremity swollen, erythematous all the way up to the knee, improved slightly from yesterday.  Lab Results: Basic Metabolic Panel:  Lab 03/19/12 7829 03/18/12 1846  NA 140 140  K 3.5 3.5  CL 104 105  CO2 27 22  GLUCOSE 139* 106*  BUN 13 17  CREATININE 0.79 0.85  CALCIUM 9.0 9.6  MG -- --  PHOS -- --   Liver Function Tests: No results found for this basename: AST:5,ALT:5,ALKPHOS:5,BILITOT:5,PROT:5,ALBUMIN:5 in the last 168 hours No results found for this basename: LIPASE:5,AMYLASE:5 in the last 168 hours No results found for this basename: AMMONIA:5 in the last 168 hours CBC:  Lab 03/19/12 0555 03/18/12 1846  WBC 7.4 9.3  NEUTROABS -- 7.4  HGB 11.5* 12.9*  HCT 32.6* 36.5*  MCV 96.4 97.6  PLT 97* 89*   Cardiac Enzymes: No results found for this basename: CKTOTAL:5,CKMB:5,CKMBINDEX:5,TROPONINI:5 in the last 168 hours BNP (last 3 results) No results found for this basename: PROBNP:3 in the last 8760 hours CBG:  Lab 03/20/12 1138 03/20/12 0737 03/19/12 2200 03/19/12 1705 03/19/12 1154  GLUCAP 152* 131* 158*  185* 182*   No results found for this basename: HGBA1C:5 in the last 72 hours Other Labs: No components found with this basename: POCBNP:3 No results found for this basename: DDIMER:2 in the last 168 hours No results found for this basename: CHOL:2,HDL:2,LDLCALC:2,TRIG:2,CHOLHDL:2,LDLDIRECT:2 in the last 168 hours No results found for this basename: TSH,T4TOTAL,FREET3,T3FREE,FREET4,THYROIDAB in the last 168 hours No results found for this basename: VITAMINB12:2,FOLATE:2,FERRITIN:2,TIBC:2,IRON:2,RETICCTPCT:2 in the last 168 hours  Micro Results: No results found for this or any previous visit (from the past 240 hour(s)).  Studies/Results: No results found.  Medications: I have reviewed the patient's current medications. Scheduled Meds:    . acidophilus  2 capsule Oral Daily  . aspirin EC  81 mg Oral Daily  . clopidogrel  75 mg Oral Daily  . enoxaparin (LOVENOX) injection  40 mg Subcutaneous Daily  . insulin aspart  0-9 Units Subcutaneous TID WC  . isosorbide mononitrate  30 mg Oral Daily  . metFORMIN  500 mg Oral BID WC  . metoprolol tartrate  25 mg Oral BID  . multivitamin with minerals  1 tablet Oral Daily  . niacin  500 mg Oral TID WC  . omega-3 acid ethyl esters  1 g Oral Daily  . piperacillin-tazobactam (ZOSYN)  IV  3.375 g Intravenous Q8H  . Tamsulosin HCl  0.4 mg Oral Daily  . vancomycin  750  mg Intravenous Q12H  . DISCONTD: enoxaparin (LOVENOX) injection  75 mg Subcutaneous Q12H  . DISCONTD: heparin  5,000 Units Subcutaneous Q8H   Continuous Infusions:  PRN Meds:.acetaminophen, nitroGLYCERIN, oxyCODONE, promethazine  Assessment/Plan: Right lower extremity cellulitis Patient has failed outpatient treatment with Keflex.  Continue vancomycin and Zosyn, antibiotic since 03/18/2012.  Repeat right lower extremity Doppler negative for DVT or SVT.  Lovenox change back to prophylactic dose. Depending on patient's clinical course, consider transitioning the patient to Bactrim  at discharge to cover for possible MRSA.  Hypertension Continue metoprolol and isosorbide mononitrate.  Type 2 diabetes uncontrolled with complications Continue to hold metformin.  Sliding scale insulin.  Coronary artery disease Continue aspirin and Plavix, beta blocker, and isosorbide mononitrate.  Anemia Send for anemia panel in the morning.  Thrombocytopenia Appears to be chronic, has seen Dr. Arbutus Ped hematology and is thought to have idiopathic thrombocytopenic purpura, currently managed conservatively.  Prophylaxis Lovenox.  CODE STATUS Full code.  Disposition Pending improvement in cellulitis.   LOS: 2 days  [name] A, MD 03/20/2012, 2:23 PM

## 2012-03-21 DIAGNOSIS — I10 Essential (primary) hypertension: Secondary | ICD-10-CM

## 2012-03-21 LAB — BASIC METABOLIC PANEL
BUN: 10 mg/dL (ref 6–23)
CO2: 25 mEq/L (ref 19–32)
Chloride: 106 mEq/L (ref 96–112)
GFR calc non Af Amer: 89 mL/min — ABNORMAL LOW (ref >90)
Glucose, Bld: 112 mg/dL — ABNORMAL HIGH (ref 70–99)
Potassium: 3.1 mEq/L — ABNORMAL LOW (ref 3.5–5.1)
Sodium: 139 mEq/L (ref 135–145)

## 2012-03-21 LAB — CBC
HCT: 30.8 % — ABNORMAL LOW (ref 39.0–52.0)
Hemoglobin: 11 g/dL — ABNORMAL LOW (ref 13.0–17.0)
MCHC: 35.7 g/dL (ref 30.0–36.0)
RBC: 3.23 MIL/uL — ABNORMAL LOW (ref 4.22–5.81)

## 2012-03-21 LAB — FERRITIN: Ferritin: 262 ng/mL (ref 22–322)

## 2012-03-21 LAB — IRON AND TIBC
Iron: 26 ug/dL — ABNORMAL LOW (ref 42–135)
Saturation Ratios: 14 % — ABNORMAL LOW (ref 20–55)
TIBC: 189 ug/dL — ABNORMAL LOW (ref 215–435)
UIBC: 163 ug/dL (ref 125–400)

## 2012-03-21 LAB — GLUCOSE, CAPILLARY
Glucose-Capillary: 118 mg/dL — ABNORMAL HIGH (ref 70–99)
Glucose-Capillary: 108 mg/dL — ABNORMAL HIGH (ref 70–99)
Glucose-Capillary: 132 mg/dL — ABNORMAL HIGH (ref 70–99)
Glucose-Capillary: 140 mg/dL — ABNORMAL HIGH (ref 70–99)

## 2012-03-21 LAB — FOLATE: Folate: 12.6 ng/mL

## 2012-03-21 MED ORDER — SULFAMETHOXAZOLE-TMP DS 800-160 MG PO TABS
1.0000 | ORAL_TABLET | Freq: Two times a day (BID) | ORAL | Status: DC
  Administered 2012-03-21 – 2012-03-22 (×3): 1 via ORAL
  Filled 2012-03-21 (×4): qty 1

## 2012-03-21 MED ORDER — POLYSACCHARIDE IRON COMPLEX 150 MG PO CAPS
150.0000 mg | ORAL_CAPSULE | Freq: Every day | ORAL | Status: DC
  Administered 2012-03-21 – 2012-03-22 (×2): 150 mg via ORAL
  Filled 2012-03-21 (×2): qty 1

## 2012-03-21 NOTE — Progress Notes (Signed)
Subjective: Right leg feels improved, still swollen.  Redness improved.  Objective: Vital signs in last 24 hours: Filed Vitals:   03/20/12 1500 03/20/12 2105 03/21/12 0456 03/21/12 1011  BP: 119/61 110/62 124/72 108/65  Pulse: 72 83 74 65  Temp: 98.7 F (37.1 C) 99.5 F (37.5 C) 98.7 F (37.1 C)   TempSrc:  Oral Oral   Resp: 18 18 18    Height:      Weight:      SpO2: 96% 95% 96%    Weight change:   Intake/Output Summary (Last 24 hours) at 03/21/12 1250 Last data filed at 03/21/12 0915  Gross per 24 hour  Intake   1290 ml  Output   1225 ml  Net     65 ml    Physical Exam: General: Awake, Oriented, No acute distress. HEENT: EOMI. Neck: Supple CV: S1 and S2 Lungs: Clear to ascultation bilaterally Abdomen: Soft, Nontender, Nondistended, +bowel sounds. Ext: Good pulses.  Right lower extremity swollen, erythema significantly improved from admission.  Lab Results: Basic Metabolic Panel:  Lab 03/21/12 7829 03/19/12 0555 03/18/12 1846  NA 139 140 140  K 3.1* 3.5 3.5  CL 106 104 105  CO2 25 27 22   GLUCOSE 112* 139* 106*  BUN 10 13 17   CREATININE 0.75 0.79 0.85  CALCIUM 8.4 9.0 9.6  MG -- -- --  PHOS -- -- --   Liver Function Tests: No results found for this basename: AST:5,ALT:5,ALKPHOS:5,BILITOT:5,PROT:5,ALBUMIN:5 in the last 168 hours No results found for this basename: LIPASE:5,AMYLASE:5 in the last 168 hours No results found for this basename: AMMONIA:5 in the last 168 hours CBC:  Lab 03/21/12 0620 03/19/12 0555 03/18/12 1846  WBC 6.4 7.4 9.3  NEUTROABS -- -- 7.4  HGB 11.0* 11.5* 12.9*  HCT 30.8* 32.6* 36.5*  MCV 95.4 96.4 97.6  PLT 110* 97* 89*   Cardiac Enzymes: No results found for this basename: CKTOTAL:5,CKMB:5,CKMBINDEX:5,TROPONINI:5 in the last 168 hours BNP (last 3 results) No results found for this basename: PROBNP:3 in the last 8760 hours CBG:  Lab 03/21/12 1210 03/21/12 0805 03/20/12 2158 03/20/12 1632 03/20/12 1138  GLUCAP 108* 118* 117*  156* 152*   No results found for this basename: HGBA1C:5 in the last 72 hours Other Labs: No components found with this basename: POCBNP:3 No results found for this basename: DDIMER:2 in the last 168 hours No results found for this basename: CHOL:2,HDL:2,LDLCALC:2,TRIG:2,CHOLHDL:2,LDLDIRECT:2 in the last 168 hours No results found for this basename: TSH,T4TOTAL,FREET3,T3FREE,FREET4,THYROIDAB in the last 168 hours  Lab 03/21/12 0620  VITAMINB12 1500*  FOLATE 12.6  FERRITIN 262  TIBC 189*  IRON 26*  RETICCTPCT 2.2    Micro Results: No results found for this or any previous visit (from the past 240 hour(s)).  Studies/Results: No results found.  Medications: I have reviewed the patient's current medications. Scheduled Meds:    . acidophilus  2 capsule Oral Daily  . aspirin EC  81 mg Oral Daily  . clopidogrel  75 mg Oral Daily  . enoxaparin (LOVENOX) injection  40 mg Subcutaneous Daily  . insulin aspart  0-9 Units Subcutaneous TID WC  . isosorbide mononitrate  30 mg Oral Daily  . metFORMIN  500 mg Oral BID WC  . metoprolol tartrate  25 mg Oral BID  . multivitamin with minerals  1 tablet Oral Daily  . niacin  500 mg Oral TID WC  . omega-3 acid ethyl esters  1 g Oral Daily  . sulfamethoxazole-trimethoprim  1 tablet Oral BID  .  Tamsulosin HCl  0.4 mg Oral Daily  . DISCONTD: piperacillin-tazobactam (ZOSYN)  IV  3.375 g Intravenous Q8H  . DISCONTD: vancomycin  750 mg Intravenous Q12H   Continuous Infusions:  PRN Meds:.acetaminophen, nitroGLYCERIN, oxyCODONE, promethazine  Assessment/Plan: Right lower extremity cellulitis Patient has failed outpatient treatment with Keflex.  Discontinue vancomycin and Zosyn transition to Bactrim today for MRSA coverage (03/21/2012), antibiotic since 03/18/2012.  Repeat right lower extremity Doppler on 03/20/2012 negative for DVT or SVT.  Continue to elevate leg while seated or laying in bed.  Hypertension Continue metoprolol and isosorbide  mononitrate.  Type 2 diabetes uncontrolled with complications Continue to hold metformin.  Sliding scale insulin.  Coronary artery disease Continue aspirin and Plavix, beta blocker, and isosorbide mononitrate.  Anemia Suggestive of iron deficiency anemia, serum iron 26.  Thrombocytopenia Appears to be chronic, has seen Dr. Arbutus Ped hematology and is thought to have idiopathic thrombocytopenic purpura, currently managed conservatively.  Hypokalemia Replace as needed.  Check magnesium tomorrow.  Prophylaxis Lovenox.  CODE STATUS Full code.  Disposition Cellulitis continues to improve, consider possible discharge tomorrow.   LOS: 3 days  Marnesha Gagen A, MD 03/21/2012, 12:50 PM

## 2012-03-22 DIAGNOSIS — D61818 Other pancytopenia: Secondary | ICD-10-CM

## 2012-03-22 LAB — BASIC METABOLIC PANEL
BUN: 11 mg/dL (ref 6–23)
Chloride: 107 mEq/L (ref 96–112)
GFR calc non Af Amer: 85 mL/min — ABNORMAL LOW (ref >90)
Glucose, Bld: 106 mg/dL — ABNORMAL HIGH (ref 70–99)
Potassium: 3.7 mEq/L (ref 3.5–5.1)
Sodium: 141 mEq/L (ref 135–145)

## 2012-03-22 LAB — CBC
HCT: 31.3 % — ABNORMAL LOW (ref 39.0–52.0)
Hemoglobin: 11.2 g/dL — ABNORMAL LOW (ref 13.0–17.0)
RBC: 3.29 MIL/uL — ABNORMAL LOW (ref 4.22–5.81)
WBC: 6 10*3/uL (ref 4.0–10.5)

## 2012-03-22 LAB — GLUCOSE, CAPILLARY: Glucose-Capillary: 114 mg/dL — ABNORMAL HIGH (ref 70–99)

## 2012-03-22 MED ORDER — OXYCODONE HCL 5 MG PO TABS: 2.5000 mg | ORAL_TABLET | Freq: Four times a day (QID) | ORAL | Status: AC | PRN

## 2012-03-22 MED ORDER — POLYSACCHARIDE IRON COMPLEX 150 MG PO CAPS: 150.0000 mg | ORAL_CAPSULE | Freq: Every day | ORAL | Status: DC

## 2012-03-22 MED ORDER — SULFAMETHOXAZOLE-TMP DS 800-160 MG PO TABS: 1.0000 | ORAL_TABLET | Freq: Two times a day (BID) | ORAL | Status: AC

## 2012-03-22 NOTE — Progress Notes (Signed)
Subjective: Right leg feels continues to improve. Swelling slightly better.   Objective: Vital signs in last 24 hours: Filed Vitals:   03/21/12 1340 03/21/12 2100 03/22/12 0436 03/22/12 0942  BP: 108/60 117/63 115/66 113/67  Pulse: 68 71 69 67  Temp: 98.3 F (36.8 C) 98.3 F (36.8 C) 98.3 F (36.8 C)   TempSrc: Oral Oral Oral   Resp: 18 20 18    Height:      Weight:      SpO2: 93% 95% 92%    Weight change:   Intake/Output Summary (Last 24 hours) at 03/22/12 1111 Last data filed at 03/22/12 0905  Gross per 24 hour  Intake    480 ml  Output    200 ml  Net    280 ml    Physical Exam: General: Awake, Oriented, No acute distress. HEENT: EOMI. Neck: Supple CV: S1 and S2 Lungs: Clear to ascultation bilaterally Abdomen: Soft, Nontender, Nondistended, +bowel sounds. Ext: Good pulses.  Right lower extremity swollen, erythema continues to improve.  Lab Results: Basic Metabolic Panel:  Lab 03/22/12 9811 03/21/12 0620 03/19/12 0555 03/18/12 1846  NA 141 139 140 140  K 3.7 3.1* 3.5 3.5  CL 107 106 104 105  CO2 27 25 27 22   GLUCOSE 106* 112* 139* 106*  BUN 11 10 13 17   CREATININE 0.83 0.75 0.79 0.85  CALCIUM 9.0 8.4 9.0 9.6  MG 2.1 -- -- --  PHOS -- -- -- --   Liver Function Tests: No results found for this basename: AST:5,ALT:5,ALKPHOS:5,BILITOT:5,PROT:5,ALBUMIN:5 in the last 168 hours No results found for this basename: LIPASE:5,AMYLASE:5 in the last 168 hours No results found for this basename: AMMONIA:5 in the last 168 hours CBC:  Lab 03/22/12 0625 03/21/12 0620 03/19/12 0555 03/18/12 1846  WBC 6.0 6.4 7.4 9.3  NEUTROABS -- -- -- 7.4  HGB 11.2* 11.0* 11.5* 12.9*  HCT 31.3* 30.8* 32.6* 36.5*  MCV 95.1 95.4 96.4 97.6  PLT 127* 110* 97* 89*   Cardiac Enzymes: No results found for this basename: CKTOTAL:5,CKMB:5,CKMBINDEX:5,TROPONINI:5 in the last 168 hours BNP (last 3 results) No results found for this basename: PROBNP:3 in the last 8760 hours CBG:  Lab  03/22/12 0751 03/21/12 2205 03/21/12 1701 03/21/12 1210 03/21/12 0805  GLUCAP 114* 140* 132* 108* 118*   No results found for this basename: HGBA1C:5 in the last 72 hours Other Labs: No components found with this basename: POCBNP:3 No results found for this basename: DDIMER:2 in the last 168 hours No results found for this basename: CHOL:2,HDL:2,LDLCALC:2,TRIG:2,CHOLHDL:2,LDLDIRECT:2 in the last 168 hours No results found for this basename: TSH,T4TOTAL,FREET3,T3FREE,FREET4,THYROIDAB in the last 168 hours  Lab 03/21/12 0620  VITAMINB12 1500*  FOLATE 12.6  FERRITIN 262  TIBC 189*  IRON 26*  RETICCTPCT 2.2    Micro Results: No results found for this or any previous visit (from the past 240 hour(s)).  Studies/Results: No results found.  Medications: I have reviewed the patient's current medications. Scheduled Meds:    . acidophilus  2 capsule Oral Daily  . aspirin EC  81 mg Oral Daily  . clopidogrel  75 mg Oral Daily  . enoxaparin (LOVENOX) injection  40 mg Subcutaneous Daily  . insulin aspart  0-9 Units Subcutaneous TID WC  . iron polysaccharides  150 mg Oral Daily  . isosorbide mononitrate  30 mg Oral Daily  . metoprolol tartrate  25 mg Oral BID  . multivitamin with minerals  1 tablet Oral Daily  . niacin  500 mg Oral  TID WC  . omega-3 acid ethyl esters  1 g Oral Daily  . sulfamethoxazole-trimethoprim  1 tablet Oral BID  . Tamsulosin HCl  0.4 mg Oral Daily  . DISCONTD: metFORMIN  500 mg Oral BID WC  . DISCONTD: piperacillin-tazobactam (ZOSYN)  IV  3.375 g Intravenous Q8H  . DISCONTD: vancomycin  750 mg Intravenous Q12H   Continuous Infusions:  PRN Meds:.acetaminophen, nitroGLYCERIN, oxyCODONE, promethazine  Assessment/Plan: Right lower extremity cellulitis Patient has failed outpatient treatment with Keflex.  Initially was on vancomycin and Zosyn which was transitioned to Bactrim on 03/21/2012 for MRSA coverage, antibiotic since 03/18/2012.  Repeat right lower extremity  Doppler on 03/20/2012 negative for DVT or SVT.  Continue to elevate leg while seated or laying in bed. Treat for total of 14 days.  Hypertension Continue metoprolol and isosorbide mononitrate.  Type 2 diabetes uncontrolled with complications Continue to hold metformin.  Sliding scale insulin. Resume metformin at discharge.  Coronary artery disease Continue aspirin and Plavix, beta blocker, and isosorbide mononitrate.  Anemia Suggestive of iron deficiency anemia, serum iron 26. Started on supplemental iron.  Thrombocytopenia Appears to be chronic, has seen Dr. Arbutus Ped hematology and is thought to have idiopathic thrombocytopenic purpura, currently managed conservatively.  Hypokalemia Replace as needed.  Magnesium normal.   Prophylaxis Lovenox.  CODE STATUS Full code.  Disposition Discharge the patient today.   LOS: 4 days  Oma Alpert A, MD 03/22/2012, 11:11 AM

## 2012-03-22 NOTE — Care Management Note (Signed)
    Page 1 of 1   03/22/2012     11:20:49 AM   CARE MANAGEMENT NOTE 03/22/2012  Patient:  [name], [name]   Account Number:  [id number]  Date Initiated:  03/22/2012  Documentation initiated by:  Letha Cape  Subjective/Objective Assessment:   dx cellulitis  admit- lives with spouse. pta independent.     Action/Plan:   Anticipated DC Date:  03/22/2012   Anticipated DC Plan:  HOME/SELF CARE      DC Planning Services  CM consult      Choice offered to / List presented to:             Status of service:  Completed, signed off Medicare Important Message given?   (If response is "NO", the following Medicare IM given date fields will be blank) Date Medicare IM given:   Date Additional Medicare IM given:    Discharge Disposition:  HOME/SELF CARE  Per UR Regulation:  Reviewed for med. necessity/level of care/duration of stay  If discussed at Long Length of Stay Meetings, dates discussed:    Comments:  03/22/12 11:19 Letha Cape RN, BSN [phone] patient lives with spouse, pta independent.  No needs anticipated.

## 2012-03-22 NOTE — Progress Notes (Signed)
03/22/12 1315  D/C instructions and prescriptions explained and given to pt. and wife; TED hose applied to pt's legs prior to d/c.  No change in skin. No problems noted; pt. accompanied downstairs via w/c by volunteer and wife for d/c. Leandrew Koyanagi Lakashia Collison,RN

## 2012-03-22 NOTE — Discharge Summary (Signed)
Physician Discharge Summary  [name] UJW:[id number] DOB: 03/01/1941 DOA: 03/18/2012  PCP: [name], MD  Admit date: 03/18/2012 Discharge date: 03/22/2012  Recommendations for Outpatient Follow-up:  1. Followup with PCP in 1 week. 2. Raise legs while sitting or laying in bed. To use compression stocking to help with swelling.   Discharge Diagnoses:  Principal Problem:  *Cellulitis Active Problems:  DM (diabetes mellitus), type 2, uncontrolled with complications  CAD  HYPERTENSION   Discharge Condition: Stable  Diet recommendation: Diabetic diet.  Filed Weights   03/18/12 2326  Weight: 76.233 kg (168 lb 1 oz)    History of present illness:  71 y/o male with CAD, HTN, and DM presents with RLE swelling, pain, and redness on 03/19/2012.  Hospital Course:  Right lower extremity cellulitis Patient has failed outpatient treatment with Keflex.  Initially was on vancomycin and Zosyn which was transitioned to Bactrim on 03/21/2012 for MRSA coverage, antibiotic since 03/18/2012.  Repeat right lower extremity Doppler on 03/20/2012 negative for DVT or SVT.  Continue to elevate leg while seated or laying in bed. Treat for total of 14 days.  Hypertension Continue metoprolol and isosorbide mononitrate.  Type 2 diabetes uncontrolled with complications Sliding scale insulin. Resume metformin at discharge.  Coronary artery disease Continue aspirin and Plavix, beta blocker, and isosorbide mononitrate.  Anemia Suggestive of iron deficiency anemia, serum iron 26. Started on supplemental iron.  Thrombocytopenia Appears to be chronic, has seen Dr. Arbutus Ped hematology and is thought to have idiopathic thrombocytopenic purpura, currently managed conservatively.  Hypokalemia Replace as needed.  Magnesium normal.   Procedures:  As above.  Consultations:  None  Discharge Exam: Filed Vitals:   03/21/12 1340 03/21/12 2100 03/22/12 0436 03/22/12 0942  BP: 108/60 117/63 115/66  113/67  Pulse: 68 71 69 67  Temp: 98.3 F (36.8 C) 98.3 F (36.8 C) 98.3 F (36.8 C)   TempSrc: Oral Oral Oral   Resp: 18 20 18    Height:      Weight:      SpO2: 93% 95% 92%    Discharge Instructions  Discharge Orders    Future Appointments: Provider: Department: Dept Phone: Center:   07/19/2012 10:30 AM Windell Hummingbird [facility] [phone] None   07/19/2012 11:00 AM Si Gaul, MD [facility] [phone] None     Future Orders Please Complete By Expires   Diet Carb Modified      Increase activity slowly      Discharge instructions      Comments:   Followup with [name], MD (PCP) in 1 week. Elevate legs while in bed. Use compression stocking until swelling improves.     Medication List  As of 03/22/2012 11:21 AM   STOP taking these medications         cephALEXin 500 MG capsule         TAKE these medications         aspirin 81 MG tablet   Take 81 mg by mouth daily.      clopidogrel 75 MG tablet   Commonly known as: PLAVIX   Take 75 mg by mouth daily.      fish oil-omega-3 fatty acids 1000 MG capsule   Take 1 g by mouth daily.      Flax Oil   Take 1 tablet by mouth daily.      ibuprofen 800 MG tablet   Commonly known as: ADVIL,MOTRIN   Take 800 mg by mouth every 8 (eight) hours as needed.  pain      iron polysaccharides 150 MG capsule   Commonly known as: NIFEREX   Take 1 capsule (150 mg total) by mouth daily.      isosorbide mononitrate 60 MG 24 hr tablet   Commonly known as: IMDUR   Take 30 mg by mouth daily. Take one half tablet (30 mg) daily by mouth      metFORMIN 500 MG tablet   Commonly known as: GLUCOPHAGE   Take 500 mg by mouth 2 (two) times daily with a meal.      metoprolol tartrate 25 MG tablet   Commonly known as: LOPRESSOR   Take 25 mg by mouth 2 (two) times daily.      multivitamin tablet   Take 1 tablet by mouth daily.      niacin 500 MG tablet   Take 500 mg by mouth 3 (three) times daily with  meals.      nitroGLYCERIN 0.4 MG SL tablet   Commonly known as: NITROSTAT   Place 0.4 mg under the tongue every 5 (five) minutes as needed.      oxyCODONE 5 MG immediate release tablet   Commonly known as: Oxy IR/ROXICODONE   Take 0.5-1 tablets (2.5-5 mg total) by mouth every 6 (six) hours as needed for pain.      sulfamethoxazole-trimethoprim 800-160 MG per tablet   Commonly known as: BACTRIM DS   Take 1 tablet by mouth 2 (two) times daily.      Tamsulosin HCl 0.4 MG Caps   Commonly known as: FLOMAX   Take by mouth daily.           Follow-up Information    Follow up with [name], MD. Schedule an appointment as soon as possible for a visit in 1 week.   Contact information:   [address] [place] [address] [phone]           The results of significant diagnostics from this hospitalization (including imaging, microbiology, ancillary and laboratory) are listed below for reference.    Significant Diagnostic Studies: No results found.  Microbiology: No results found for this or any previous visit (from the past 240 hour(s)).   Labs: Basic Metabolic Panel:  Lab 03/22/12 2536 03/21/12 0620 03/19/12 0555 03/18/12 1846  NA 141 139 140 140  K 3.7 3.1* 3.5 3.5  CL 107 106 104 105  CO2 27 25 27 22   GLUCOSE 106* 112* 139* 106*  BUN 11 10 13 17   CREATININE 0.83 0.75 0.79 0.85  CALCIUM 9.0 8.4 9.0 9.6  MG 2.1 -- -- --  PHOS -- -- -- --   Liver Function Tests: No results found for this basename: AST:5,ALT:5,ALKPHOS:5,BILITOT:5,PROT:5,ALBUMIN:5 in the last 168 hours No results found for this basename: LIPASE:5,AMYLASE:5 in the last 168 hours No results found for this basename: AMMONIA:5 in the last 168 hours CBC:  Lab 03/22/12 0625 03/21/12 0620 03/19/12 0555 03/18/12 1846  WBC 6.0 6.4 7.4 9.3  NEUTROABS -- -- -- 7.4  HGB 11.2* 11.0* 11.5* 12.9*  HCT 31.3* 30.8* 32.6* 36.5*  MCV 95.1 95.4 96.4 97.6  PLT 127* 110* 97* 89*    Cardiac Enzymes: No results found for this basename: CKTOTAL:5,CKMB:5,CKMBINDEX:5,TROPONINI:5 in the last 168 hours BNP: BNP (last 3 results) No results found for this basename: PROBNP:3 in the last 8760 hours CBG:  Lab 03/22/12 0751 03/21/12 2205 03/21/12 1701 03/21/12 1210 03/21/12 0805  GLUCAP 114* 140* 132* 108* 118*    Time coordinating  discharge: 35 minutes  Signed:  Janziel Hockett A  [facility] 03/22/2012, 11:21 AM

## 2012-07-19 ENCOUNTER — Telehealth: Payer: Self-pay | Admitting: Internal Medicine

## 2012-07-19 ENCOUNTER — Encounter: Payer: Self-pay | Admitting: Internal Medicine

## 2012-07-19 ENCOUNTER — Ambulatory Visit (HOSPITAL_BASED_OUTPATIENT_CLINIC_OR_DEPARTMENT_OTHER): Payer: Medicare Other | Admitting: Internal Medicine

## 2012-07-19 ENCOUNTER — Other Ambulatory Visit (HOSPITAL_BASED_OUTPATIENT_CLINIC_OR_DEPARTMENT_OTHER): Payer: Medicare Other | Admitting: Lab

## 2012-07-19 VITALS — BP 113/61 | HR 56 | Temp 97.1°F | Resp 20 | Ht 70.0 in | Wt 178.6 lb

## 2012-07-19 DIAGNOSIS — D693 Immune thrombocytopenic purpura: Secondary | ICD-10-CM

## 2012-07-19 LAB — COMPREHENSIVE METABOLIC PANEL (CC13)
AST: 38 U/L — ABNORMAL HIGH (ref 5–34)
BUN: 10 mg/dL (ref 7.0–26.0)
Calcium: 8.9 mg/dL (ref 8.4–10.4)
Chloride: 106 mEq/L (ref 98–107)
Creatinine: 0.9 mg/dL (ref 0.7–1.3)
Total Bilirubin: 1.06 mg/dL (ref 0.20–1.20)

## 2012-07-19 LAB — CBC WITH DIFFERENTIAL/PLATELET
Basophils Absolute: 0 10*3/uL (ref 0.0–0.1)
HCT: 40.7 % (ref 38.4–49.9)
HGB: 14.1 g/dL (ref 13.0–17.1)
MCH: 34.7 pg — ABNORMAL HIGH (ref 27.2–33.4)
MONO#: 0.3 10*3/uL (ref 0.1–0.9)
NEUT%: 59.8 % (ref 39.0–75.0)
WBC: 4.6 10*3/uL (ref 4.0–10.3)
lymph#: 1.3 10*3/uL (ref 0.9–3.3)

## 2012-07-19 NOTE — Telephone Encounter (Signed)
appts made and printed for pt aom °

## 2012-07-19 NOTE — Patient Instructions (Signed)
CBC today showed low but stable platelet count. You are currently asymptomatic. Followup in 6 months with repeat CBC and comprehensive metabolic panel.

## 2012-07-19 NOTE — Progress Notes (Signed)
[facility] Telephone:([phone]   Fax:(336) [phone]  OFFICE PROGRESS NOTE  [name], MD [address] [place] [address]  DIAGNOSIS: Idiopathic thrombocytopenic purpura   PRIOR THERAPY: None   CURRENT THERAPY: Observation.  INTERVAL HISTORY: [name] 72 y.o. male returns to the clinic today for followup visit accompanied his wife. The patient is feeling fine today with no specific complaints. He denied having any significant bleeding issues. He has no bruises or ecchymosis. He has no weight loss or night sweats. He has no significant chest pain, shortness of breath, cough or hemoptysis. He has repeat CBC performed earlier today and he is here for evaluation and discussion of his lab results. He was treated for cellulitis of the lower extremity in September of 2013 and his platelets count improved at that time.  MEDICAL HISTORY: Past Medical History  Diagnosis Date  . Diabetes mellitus   . Pancytopenia   . Hiatal hernia 2011    EGD  . Diverticulosis of colon (without mention of hemorrhage) 2011    Colonoscopy   . Hypertension   . ITP (idiopathic thrombocytopenic purpura)     He has been diagnosed with  . CAD (coronary artery disease)     Hx of     ALLERGIES:  is allergic to crestor; lipitor; and zocor.  MEDICATIONS:  Current Outpatient Prescriptions  Medication Sig Dispense Refill  . clopidogrel (PLAVIX) 75 MG tablet Take 75 mg by mouth daily.      . fish oil-omega-3 fatty acids 1000 MG capsule Take 1 g by mouth daily.      . Flax OIL Take 1 tablet by mouth daily.      . isosorbide mononitrate (IMDUR) 60 MG 24 hr tablet Take 30 mg by mouth daily. Take one half tablet (30 mg) daily by mouth      . metFORMIN (GLUCOPHAGE) 500 MG tablet Take 500 mg by mouth 2 (two) times daily with a meal.       . metoprolol tartrate (LOPRESSOR) 25 MG tablet Take 25 mg by mouth daily. 07/19/12- 3 months ago Pt cut back to one tablet a day due to  dizziness      . Multiple Vitamin (MULTIVITAMIN) tablet Take 1 tablet by mouth daily.        . niacin 500 MG tablet Take 500 mg by mouth 3 (three) times daily with meals.        . Tamsulosin HCl (FLOMAX) 0.4 MG CAPS Take by mouth daily.        Marland Kitchen aspirin 81 MG tablet Take 81 mg by mouth daily.        Marland Kitchen ibuprofen (ADVIL,MOTRIN) 800 MG tablet Take 800 mg by mouth every 8 (eight) hours as needed. pain      . nitroGLYCERIN (NITROSTAT) 0.4 MG SL tablet Place 0.4 mg under the tongue every 5 (five) minutes as needed.        . [DISCONTINUED] carvedilol (COREG) 3.125 MG tablet Take 3.125 mg by mouth 2 (two) times daily with a meal.          SURGICAL HISTORY:  Past Surgical History  Procedure Date  . Hernia repair   . Coronary angioplasty with stent placement   . US echocardiography 02-08-2007    Est. EF 40-45%  . Coronary artery bypass graft     x4 with a left internal mammary artery anastomosis to the left anterior descending coronary artery   . Saphenous vein graft  resection     graft to the first obtuse marginal, a saphenous vein graft to the first diagonal coronary artery  and a saphenous vein graft to the distal right coronary artery  . Cardiac catheterization     His last heart catheterization in May of 2009 reveals a patent LIMA  . Coronary stent placement     successful percutaneous transluminal coronary angioplasty and stenting of the left main coronary artery  . Coronary angioplasty     Successful percutaneous transluminal coronary angioplasty of the left circumflex obuse marginal vessel    REVIEW OF SYSTEMS:  A comprehensive review of systems was negative.   PHYSICAL EXAMINATION: General appearance: alert, cooperative and no distress Head: Normocephalic, without obvious abnormality, atraumatic Neck: no adenopathy Resp: clear to auscultation bilaterally Cardio: regular rate and rhythm, S1, S2 normal, no murmur, click, rub or gallop GI: soft, non-tender; bowel sounds normal; no  masses,  no organomegaly Extremities: extremities normal, atraumatic, no cyanosis or edema  ECOG PERFORMANCE STATUS: 1 - Symptomatic but completely ambulatory  Blood pressure 113/61, pulse 56, temperature 97.1 F (36.2 C), temperature source Oral, resp. rate 20, height 5\' 10"  (1.778 m), weight 178 lb 9.6 oz (81.012 kg).  LABORATORY DATA: Lab Results  Component Value Date   WBC 4.6 07/19/2012   HGB 14.1 07/19/2012   HCT 40.7 07/19/2012   MCV 99.8* 07/19/2012   PLT 77* 07/19/2012      Chemistry      Component Value Date/Time   NA 141 03/22/2012 0625   K 3.7 03/22/2012 0625   CL 107 03/22/2012 0625   CO2 27 03/22/2012 0625   BUN 11 03/22/2012 0625   CREATININE 0.83 03/22/2012 0625      Component Value Date/Time   CALCIUM 9.0 03/22/2012 0625   ALKPHOS 52 01/26/2012 1249   AST 43* 01/26/2012 1249   ALT 31 01/26/2012 1249   BILITOT 1.2 01/26/2012 1249       RADIOGRAPHIC STUDIES: No results found.  ASSESSMENT: This is a very pleasant 72 years old white male with history of idiopathic thrombocytopenic purpura currently on observation. The patient is feeling fine and he is currently asymptomatic. Platelets counts still low but stable   PLAN: I discussed the lab result with the patient and his wife. I recommended for him to continue on observation for now with repeat CBC and comprehensive metabolic panel in 6 months. The patient come back for followup visit at that time. He was advised to call me immediately if he has any concerning symptoms in the interval he  All questions were answered. The patient knows to call the clinic with any problems, questions or concerns. We can certainly see the patient much sooner if necessary.

## 2012-10-26 ENCOUNTER — Other Ambulatory Visit: Payer: Self-pay | Admitting: *Deleted

## 2012-10-26 MED ORDER — METOPROLOL TARTRATE 25 MG PO TABS: 25.0000 mg | ORAL_TABLET | Freq: Every day | ORAL | Status: DC

## 2012-10-26 MED ORDER — CLOPIDOGREL BISULFATE 75 MG PO TABS: 75.0000 mg | ORAL_TABLET | Freq: Every day | ORAL | Status: DC

## 2012-12-28 ENCOUNTER — Other Ambulatory Visit: Payer: Self-pay | Admitting: *Deleted

## 2012-12-28 MED ORDER — METOPROLOL TARTRATE 25 MG PO TABS: 25.0000 mg | ORAL_TABLET | Freq: Every day | ORAL | Status: DC

## 2012-12-28 NOTE — Telephone Encounter (Signed)
No refills until appointment Fax Received. Refill Completed. [name] (R.M.A)   

## 2013-01-03 ENCOUNTER — Other Ambulatory Visit: Payer: Self-pay | Admitting: *Deleted

## 2013-01-03 NOTE — Telephone Encounter (Signed)
Opened in Error.

## 2013-01-05 ENCOUNTER — Encounter: Payer: Self-pay | Admitting: Cardiovascular Disease

## 2013-01-05 ENCOUNTER — Ambulatory Visit (INDEPENDENT_AMBULATORY_CARE_PROVIDER_SITE_OTHER): Payer: Medicare Other | Admitting: Cardiovascular Disease

## 2013-01-05 VITALS — BP 120/60 | HR 55 | Ht 70.0 in | Wt 175.8 lb

## 2013-01-05 DIAGNOSIS — I2510 Atherosclerotic heart disease of native coronary artery without angina pectoris: Secondary | ICD-10-CM

## 2013-01-05 MED ORDER — CLOPIDOGREL BISULFATE 75 MG PO TABS: 75.0000 mg | ORAL_TABLET | Freq: Every day | ORAL | Status: DC

## 2013-01-05 MED ORDER — METOPROLOL TARTRATE 25 MG PO TABS: 12.5000 mg | ORAL_TABLET | Freq: Two times a day (BID) | ORAL | Status: DC

## 2013-01-05 MED ORDER — ISOSORBIDE MONONITRATE ER 60 MG PO TB24: 30.0000 mg | ORAL_TABLET | Freq: Every day | ORAL | Status: DC

## 2013-01-05 NOTE — Progress Notes (Signed)
[name] Date of Birth  28-Mar-1941       [facility] Office [address]  [address] [place], [place]  [address]   [place], [place]  [address] [phone]     [phone]   Fax  [phone]    Fax [phone]  Problem List: 1. Coronary artery disease-status post MI and eventually s/p  CABG and also stenting 2. Hyperlipidemia-he has been generally intolerant of statins-Crestor causes muscle pain, Lipitor causes memory loss, Zocor causes muscle pain. 2. Diabetes mellitus  History of Present Illness: Mr. Goracke is an 72 year old gentleman with a history of coronary artery disease. He status post coronary artery bypass grafting. He is also status post PTCA and stenting. He also has a  history of hyperlipidemia and diabetes mellitus.   He is exercising regularly .  He does lots of yard work.  He enjoys going to his beach house.  January 05, 2013:  Mr. Insley is doing well.  His garden is doing well.  Occasional CP and dyspnea.  Pains last only for a few seconds.   Does not have to take a NTG.  Mows the lawn and does yard work without problems.    Current Outpatient Prescriptions on File Prior to Visit  Medication Sig Dispense Refill  . aspirin 81 MG tablet Take 81 mg by mouth daily.        . clopidogrel (PLAVIX) 75 MG tablet Take 1 tablet (75 mg total) by mouth daily.  30 tablet  2  . fish oil-omega-3 fatty acids 1000 MG capsule Take 1 g by mouth daily.      . isosorbide mononitrate (IMDUR) 60 MG 24 hr tablet Take 30 mg by mouth daily. Take one half tablet (30 mg) daily by mouth      . metFORMIN (GLUCOPHAGE) 500 MG tablet Take 500 mg by mouth 2 (two) times daily with a meal.       . metoprolol tartrate (LOPRESSOR) 25 MG tablet Take 1 tablet (25 mg total) by mouth daily.  30 tablet  0  . Multiple Vitamin (MULTIVITAMIN) tablet Take 1 tablet by mouth daily.        . niacin 500 MG tablet Take 500 mg by mouth 3 (three) times  daily with meals.        . nitroGLYCERIN (NITROSTAT) 0.4 MG SL tablet Place 0.4 mg under the tongue every 5 (five) minutes as needed.        . [DISCONTINUED] carvedilol (COREG) 3.125 MG tablet Take 3.125 mg by mouth 2 (two) times daily with a meal.         No current facility-administered medications on file prior to visit.    Allergies  Allergen Reactions  . Crestor (Rosuvastatin Calcium)     Causes Muscle Pain  . Lipitor (Atorvastatin Calcium)     Causes memory loss  . Zocor (Simvastatin)     Causes muscle pain    Past Medical History  Diagnosis Date  . Diabetes mellitus   . Pancytopenia   . Hiatal hernia 2011    EGD  . Diverticulosis of colon (without mention of hemorrhage) 2011    Colonoscopy   . Hypertension   . ITP (idiopathic thrombocytopenic purpura)     He has been diagnosed with  . CAD (coronary artery disease)     Hx of     Past Surgical History  Procedure Laterality Date  . Hernia repair    .  Coronary angioplasty with stent placement    . US echocardiography  02-08-2007    Est. EF 40-45%  . Coronary artery bypass graft      x4 with a left internal mammary artery anastomosis to the left anterior descending coronary artery   . Saphenous vein graft resection      graft to the first obtuse marginal, a saphenous vein graft to the first diagonal coronary artery  and a saphenous vein graft to the distal right coronary artery  . Cardiac catheterization      His last heart catheterization in May of 2009 reveals a patent LIMA  . Coronary stent placement      successful percutaneous transluminal coronary angioplasty and stenting of the left main coronary artery  . Coronary angioplasty      Successful percutaneous transluminal coronary angioplasty of the left circumflex obuse marginal vessel    History  Smoking status  . Never Smoker   Smokeless tobacco  . Never Used    History  Alcohol Use No    Family History  Problem Relation Age of Onset  . Colon  cancer Neg Hx     Reviw of Systems:  Reviewed in the HPI.  All other systems are negative.  Physical Exam: Blood pressure 120/60, pulse 55, height 5\' 10"  (1.778 m), weight 175 lb 12.8 oz (79.742 kg). General: Well developed, well nourished, in no acute distress.  Head: Normocephalic, atraumatic, sclera non-icteric, mucus membranes are moist,   Neck: Supple. Carotids are 2 + without bruits. No JVD  Lungs: Clear bilaterally to auscultation.  Heart: regular rate.  normal  S1 S2. No murmurs, gallops or rubs.  Abdomen: Soft, non-tender, non-distended with normal bowel sounds. No hepatomegaly. No rebound/guarding. No masses.  Msk:  Strength and tone are normal  Extremities: No clubbing or cyanosis. No edema.  Distal pedal pulses are 2+ and equal bilaterally.  He has bruising on his arm.   Neuro: Alert and oriented X 3. Moves all extremities spontaneously.  Psych:  Responds to questions appropriately with a normal affect.  ECG: January 05, 2013: sinus brady at 66. LAE.   Assessment / Plan:

## 2013-01-05 NOTE — Assessment & Plan Note (Signed)
Stable.  No significant dyspnea.  Continue current meds.

## 2013-01-05 NOTE — Assessment & Plan Note (Signed)
[name] is doing well.  He is having lots of bruising.

## 2013-01-05 NOTE — Patient Instructions (Signed)
Your physician has recommended you make the following change in your medication:   Metoprolol 25 mg tab, take 1/2 tablet twice daily 12 hours apart Stop aspirin  Your physician wants you to follow-up in: 6 months You will receive a reminder letter in the mail two months in advance. If you don't receive a letter, please call our office to schedule the follow-up appointment.   Your physician recommends that you return for a FASTING lipid profile: 6 months

## 2013-01-24 ENCOUNTER — Ambulatory Visit (HOSPITAL_BASED_OUTPATIENT_CLINIC_OR_DEPARTMENT_OTHER): Payer: Medicare Other | Admitting: Internal Medicine

## 2013-01-24 ENCOUNTER — Encounter: Payer: Self-pay | Admitting: Internal Medicine

## 2013-01-24 ENCOUNTER — Telehealth: Payer: Self-pay | Admitting: Internal Medicine

## 2013-01-24 ENCOUNTER — Other Ambulatory Visit (HOSPITAL_BASED_OUTPATIENT_CLINIC_OR_DEPARTMENT_OTHER): Payer: Medicare Other | Admitting: Lab

## 2013-01-24 VITALS — BP 108/63 | HR 59 | Temp 99.3°F | Resp 20 | Ht 70.0 in | Wt 174.0 lb

## 2013-01-24 DIAGNOSIS — D696 Thrombocytopenia, unspecified: Secondary | ICD-10-CM | POA: Insufficient documentation

## 2013-01-24 DIAGNOSIS — D693 Immune thrombocytopenic purpura: Secondary | ICD-10-CM

## 2013-01-24 HISTORY — DX: Thrombocytopenia, unspecified: D69.6

## 2013-01-24 LAB — COMPREHENSIVE METABOLIC PANEL (CC13)
BUN: 11.1 mg/dL (ref 7.0–26.0)
CO2: 24 mEq/L (ref 22–29)
Calcium: 8.6 mg/dL (ref 8.4–10.4)
Chloride: 105 mEq/L (ref 98–109)
Creatinine: 0.8 mg/dL (ref 0.7–1.3)

## 2013-01-24 LAB — CBC WITH DIFFERENTIAL/PLATELET
BASO%: 0.7 % (ref 0.0–2.0)
Basophils Absolute: 0 10*3/uL (ref 0.0–0.1)
HCT: 38.2 % — ABNORMAL LOW (ref 38.4–49.9)
HGB: 13.5 g/dL (ref 13.0–17.1)
MONO#: 0.3 10*3/uL (ref 0.1–0.9)
NEUT%: 55 % (ref 39.0–75.0)
WBC: 3.9 10*3/uL — ABNORMAL LOW (ref 4.0–10.3)
lymph#: 1.1 10*3/uL (ref 0.9–3.3)

## 2013-01-24 NOTE — Telephone Encounter (Signed)
gv and printed appt sche and avs forlpt.

## 2013-01-24 NOTE — Progress Notes (Signed)
[facility] Telephone:(336) [phone]   Fax:(336) [phone]  OFFICE PROGRESS NOTE  [name], MD [address] [place] [address]  DIAGNOSIS: Idiopathic thrombocytopenic purpura   PRIOR THERAPY: None   CURRENT THERAPY: Observation.  INTERVAL HISTORY: [name] 72 y.o. male returns to the clinic today for six-month followup visit but the patient is feeling fine today with no specific complaints. He denied having any significant bleeding issues but continues to have some ecchymosis in the upper extremities. The patient denied having any weight loss or night sweats. He denied having any chest pain, shortness breath, cough or hemoptysis. He has repeat CBC performed earlier today and he is here for evaluation and discussion of his lab results.  MEDICAL HISTORY: Past Medical History  Diagnosis Date  . Diabetes mellitus   . Pancytopenia   . Hiatal hernia 2011    EGD  . Diverticulosis of colon (without mention of hemorrhage) 2011    Colonoscopy   . Hypertension   . ITP (idiopathic thrombocytopenic purpura)     He has been diagnosed with  . CAD (coronary artery disease)     Hx of     ALLERGIES:  is allergic to crestor; lipitor; and zocor.  MEDICATIONS:  Current Outpatient Prescriptions  Medication Sig Dispense Refill  . clopidogrel (PLAVIX) 75 MG tablet Take 1 tablet (75 mg total) by mouth daily.  30 tablet  11  . fish oil-omega-3 fatty acids 1000 MG capsule Take 1 g by mouth daily.      . isosorbide mononitrate (IMDUR) 60 MG 24 hr tablet Take 0.5 tablets (30 mg total) by mouth daily.  15 tablet  11  . metFORMIN (GLUCOPHAGE) 500 MG tablet Take 500 mg by mouth 2 (two) times daily with a meal.       . metoprolol tartrate (LOPRESSOR) 25 MG tablet Take 0.5 tablets (12.5 mg total) by mouth 2 (two) times daily.  30 tablet  11  . Multiple Vitamin (MULTIVITAMIN) tablet Take 1 tablet by mouth daily.        . niacin 500 MG tablet Take 500 mg by mouth 3  (three) times daily with meals.        . nitroGLYCERIN (NITROSTAT) 0.4 MG SL tablet Place 0.4 mg under the tongue every 5 (five) minutes as needed.        . [DISCONTINUED] carvedilol (COREG) 3.125 MG tablet Take 3.125 mg by mouth 2 (two) times daily with a meal.         No current facility-administered medications for this visit.    SURGICAL HISTORY:  Past Surgical History  Procedure Laterality Date  . Hernia repair    . Coronary angioplasty with stent placement    . US echocardiography  02-08-2007    Est. EF 40-45%  . Coronary artery bypass graft      x4 with a left internal mammary artery anastomosis to the left anterior descending coronary artery   . Saphenous vein graft resection      graft to the first obtuse marginal, a saphenous vein graft to the first diagonal coronary artery  and a saphenous vein graft to the distal right coronary artery  . Cardiac catheterization      His last heart catheterization in May of 2009 reveals a patent LIMA  . Coronary stent placement      successful percutaneous transluminal coronary angioplasty and stenting of the left main coronary artery  . Coronary angioplasty  Successful percutaneous transluminal coronary angioplasty of the left circumflex obuse marginal vessel    REVIEW OF SYSTEMS:  A comprehensive review of systems was negative.   PHYSICAL EXAMINATION: General appearance: alert, cooperative and no distress Head: Normocephalic, without obvious abnormality, atraumatic Neck: no adenopathy Lymph nodes: Cervical, supraclavicular, and axillary nodes normal. Resp: clear to auscultation bilaterally Cardio: regular rate and rhythm, S1, S2 normal, no murmur, click, rub or gallop GI: soft, non-tender; bowel sounds normal; no masses,  no organomegaly Extremities: extremities normal, atraumatic, no cyanosis or edema  ECOG PERFORMANCE STATUS: 0 - Asymptomatic  Blood pressure 108/63, pulse 59, temperature 99.3 F (37.4 C), temperature source  Oral, resp. rate 20, height 5\' 10"  (1.778 m), weight 174 lb (78.926 kg).  LABORATORY DATA: Lab Results  Component Value Date   WBC 3.9* 01/24/2013   HGB 13.5 01/24/2013   HCT 38.2* 01/24/2013   MCV 98.7* 01/24/2013   PLT 70* 01/24/2013      Chemistry      Component Value Date/Time   NA 135* 01/24/2013 0954   NA 141 03/22/2012 0625   K 4.2 01/24/2013 0954   K 3.7 03/22/2012 0625   CL 106 07/19/2012 1028   CL 107 03/22/2012 0625   CO2 24 01/24/2013 0954   CO2 27 03/22/2012 0625   BUN 11.1 01/24/2013 0954   BUN 11 03/22/2012 0625   CREATININE 0.8 01/24/2013 0954   CREATININE 0.83 03/22/2012 0625      Component Value Date/Time   CALCIUM 8.6 01/24/2013 0954   CALCIUM 9.0 03/22/2012 0625   ALKPHOS 76 01/24/2013 0954   ALKPHOS 52 01/26/2012 1249   AST 36* 01/24/2013 0954   AST 43* 01/26/2012 1249   ALT 33 01/24/2013 0954   ALT 31 01/26/2012 1249   BILITOT 1.09 01/24/2013 0954   BILITOT 1.2 01/26/2012 1249       RADIOGRAPHIC STUDIES: No results found.  ASSESSMENT AND PLAN: This is a very pleasant 72 years old white male with history of idiopathic thrombocytopenic purpura currently on observation. His platelets count are low but stable. The patient denied having any significant bleeding issues but has some bruises or ecchymosis in the upper extremities. I recommended for him to continue on observation for now. I would see him back for followup visit in 6 months with repeat CBC. He was advised to call immediately if he has any concerning symptoms in the interval.  All questions were answered. The patient knows to call the clinic with any problems, questions or concerns. We can certainly see the patient much sooner if necessary.

## 2013-01-24 NOTE — Patient Instructions (Signed)
Your platelets count are low but stable. Continue on observation with repeat CBC in 6 months

## 2013-06-27 ENCOUNTER — Encounter: Payer: Self-pay | Admitting: Cardiovascular Disease

## 2013-06-27 ENCOUNTER — Ambulatory Visit (INDEPENDENT_AMBULATORY_CARE_PROVIDER_SITE_OTHER): Payer: Medicare Other | Admitting: Cardiovascular Disease

## 2013-06-27 VITALS — BP 100/58 | HR 60 | Ht 70.0 in | Wt 177.8 lb

## 2013-06-27 DIAGNOSIS — I2510 Atherosclerotic heart disease of native coronary artery without angina pectoris: Secondary | ICD-10-CM

## 2013-06-27 DIAGNOSIS — I509 Heart failure, unspecified: Secondary | ICD-10-CM

## 2013-06-27 DIAGNOSIS — I5022 Chronic systolic (congestive) heart failure: Secondary | ICD-10-CM

## 2013-06-27 NOTE — Progress Notes (Signed)
[name] Date of Birth  [birth year]       [facility] Office [address], [address]  [address] [place], [place]  [address]   [place], [place]  [address] [phone]     [phone]   Fax  [phone]    Fax [phone]  Problem List: 1. Coronary artery disease-status post MI and eventually s/p  CABG and also stenting 2. Hyperlipidemia-he has been generally intolerant of statins-Crestor causes muscle pain, Lipitor causes memory loss, Zocor causes muscle pain. 2. Diabetes mellitus  History of Present Illness: [name] is an 72 year old gentleman with a history of coronary artery disease. He status post coronary artery bypass grafting. He is also status post PTCA and stenting. He also has a  history of hyperlipidemia and diabetes mellitus.   He is exercising regularly .  He does lots of yard work.  He enjoys going to his beach house.  January 05, 2013:  [name] is doing well.  His garden is doing well.  Occasional CP and dyspnea.  Pains last only for a few seconds.   Does not have to take a NTG.  Mows the lawn and does yard work without problems.   Dec. 16, 2014:  [name] is doing well.  Doing yard work without problems.    Occasional episodes   Current Outpatient Prescriptions on File Prior to Visit  Medication Sig Dispense Refill  . clopidogrel (PLAVIX) 75 MG tablet Take 1 tablet (75 mg total) by mouth daily.  30 tablet  11  . fish oil-omega-3 fatty acids 1000 MG capsule Take 1 g by mouth daily.      . isosorbide mononitrate (IMDUR) 60 MG 24 hr tablet Take 0.5 tablets (30 mg total) by mouth daily.  15 tablet  11  . metFORMIN (GLUCOPHAGE) 500 MG tablet Take 500 mg by mouth 2 (two) times daily with a meal.       . metoprolol tartrate (LOPRESSOR) 25 MG tablet Take 0.5 tablets (12.5 mg total) by mouth 2 (two) times daily.  30 tablet  11  . Multiple Vitamin (MULTIVITAMIN) tablet Take 1 tablet by mouth daily.        .  niacin 500 MG tablet Take 500 mg by mouth 3 (three) times daily with meals.        . nitroGLYCERIN (NITROSTAT) 0.4 MG SL tablet Place 0.4 mg under the tongue every 5 (five) minutes as needed.        . [DISCONTINUED] carvedilol (COREG) 3.125 MG tablet Take 3.125 mg by mouth 2 (two) times daily with a meal.         No current facility-administered medications on file prior to visit.    Allergies  Allergen Reactions  . Crestor [Rosuvastatin Calcium]     Causes Muscle Pain  . Lipitor [Atorvastatin Calcium]     Causes memory loss  . Zocor [Simvastatin]     Causes muscle pain    Past Medical History  Diagnosis Date  . Diabetes mellitus   . Pancytopenia   . Hiatal hernia 2011    EGD  . Diverticulosis of colon (without mention of hemorrhage) 2011    Colonoscopy   . Hypertension   . ITP (idiopathic thrombocytopenic purpura)     He has been diagnosed with  . CAD (coronary artery disease)     Hx of     Past Surgical History  Procedure Laterality Date  . Hernia repair    .  Coronary angioplasty with stent placement    . US echocardiography  02-08-2007    Est. EF 40-45%  . Coronary artery bypass graft      x4 with a left internal mammary artery anastomosis to the left anterior descending coronary artery   . Saphenous vein graft resection      graft to the first obtuse marginal, a saphenous vein graft to the first diagonal coronary artery  and a saphenous vein graft to the distal right coronary artery  . Cardiac catheterization      His last heart catheterization in May of 2009 reveals a patent LIMA  . Coronary stent placement      successful percutaneous transluminal coronary angioplasty and stenting of the left main coronary artery  . Coronary angioplasty      Successful percutaneous transluminal coronary angioplasty of the left circumflex obuse marginal vessel    History  Smoking status  . Never Smoker   Smokeless tobacco  . Never Used    History  Alcohol Use No     Family History  Problem Relation Age of Onset  . Colon cancer Neg Hx     Reviw of Systems:  Reviewed in the HPI.  All other systems are negative.  Physical Exam: Blood pressure 100/58, pulse 60, height 5\' 10"  (1.778 m), weight 177 lb 12.8 oz (80.65 kg). General: Well developed, well nourished, in no acute distress.  Head: Normocephalic, atraumatic, sclera non-icteric, mucus membranes are moist,   Neck: Supple. Carotids are 2 + without bruits. No JVD  Lungs: Clear bilaterally to auscultation.  Heart: regular rate.  normal  S1 S2. No murmurs, gallops or rubs.  Abdomen: Soft, non-tender, non-distended with normal bowel sounds. No hepatomegaly. No rebound/guarding. No masses.  Msk:  Strength and tone are normal  Extremities: No clubbing or cyanosis. No edema.  Distal pedal pulses are 2+ and equal bilaterally.  He has bruising on his arm.   Neuro: Alert and oriented X 3. Moves all extremities spontaneously.  Psych:  Responds to questions appropriately with a normal affect.  ECG:  Assessment / Plan:

## 2013-06-27 NOTE — Assessment & Plan Note (Addendum)
He remains stable.       His HR and BP are well controlled.    he has a history of chronic systolic congestive heart failure due to ischemic heart disease. We'll get an echocardiogram. We'll consider adding Aldactone if his left systolic function is still markedly obese

## 2013-06-27 NOTE — Patient Instructions (Signed)
Your physician has requested that you have an echocardiogram. Echocardiography is a painless test that uses sound waves to create images of your heart. It provides your doctor with information about the size and shape of your heart and how well your heart's chambers and valves are working. This procedure takes approximately one hour. There are no restrictions for this procedure.  Your physician recommends that you return for lab work in: today   Your physician wants you to follow-up in: 6 months  You will receive a reminder letter in the mail two months in advance. If you don't receive a letter, please call our office to schedule the follow-up appointment.

## 2013-06-27 NOTE — Assessment & Plan Note (Signed)
Stable, no angina  

## 2013-07-18 ENCOUNTER — Encounter: Payer: Self-pay | Admitting: Cardiology

## 2013-07-18 ENCOUNTER — Ambulatory Visit (HOSPITAL_COMMUNITY): Payer: Medicare HMO | Attending: Cardiology | Admitting: Radiology

## 2013-07-18 DIAGNOSIS — I359 Nonrheumatic aortic valve disorder, unspecified: Secondary | ICD-10-CM | POA: Insufficient documentation

## 2013-07-18 DIAGNOSIS — E785 Hyperlipidemia, unspecified: Secondary | ICD-10-CM | POA: Insufficient documentation

## 2013-07-18 DIAGNOSIS — I252 Old myocardial infarction: Secondary | ICD-10-CM | POA: Insufficient documentation

## 2013-07-18 DIAGNOSIS — I059 Rheumatic mitral valve disease, unspecified: Secondary | ICD-10-CM | POA: Insufficient documentation

## 2013-07-18 DIAGNOSIS — I509 Heart failure, unspecified: Secondary | ICD-10-CM | POA: Insufficient documentation

## 2013-07-18 DIAGNOSIS — E119 Type 2 diabetes mellitus without complications: Secondary | ICD-10-CM | POA: Insufficient documentation

## 2013-07-18 DIAGNOSIS — Z951 Presence of aortocoronary bypass graft: Secondary | ICD-10-CM | POA: Insufficient documentation

## 2013-07-18 DIAGNOSIS — I2510 Atherosclerotic heart disease of native coronary artery without angina pectoris: Secondary | ICD-10-CM | POA: Insufficient documentation

## 2013-07-18 DIAGNOSIS — I10 Essential (primary) hypertension: Secondary | ICD-10-CM | POA: Insufficient documentation

## 2013-07-18 NOTE — Progress Notes (Signed)
Echocardiogram performed.  

## 2013-07-25 ENCOUNTER — Ambulatory Visit: Payer: Medicare Other | Admitting: Internal Medicine

## 2013-07-25 ENCOUNTER — Other Ambulatory Visit: Payer: Medicare Other | Admitting: Lab

## 2013-07-25 ENCOUNTER — Other Ambulatory Visit (HOSPITAL_BASED_OUTPATIENT_CLINIC_OR_DEPARTMENT_OTHER): Payer: Medicare HMO

## 2013-07-25 ENCOUNTER — Ambulatory Visit (HOSPITAL_BASED_OUTPATIENT_CLINIC_OR_DEPARTMENT_OTHER): Payer: Medicare HMO | Admitting: Physician Assistant

## 2013-07-25 ENCOUNTER — Encounter: Payer: Self-pay | Admitting: Physician Assistant

## 2013-07-25 VITALS — BP 111/55 | HR 64 | Temp 99.1°F | Resp 18 | Wt 172.2 lb

## 2013-07-25 DIAGNOSIS — D693 Immune thrombocytopenic purpura: Secondary | ICD-10-CM

## 2013-07-25 DIAGNOSIS — I10 Essential (primary) hypertension: Secondary | ICD-10-CM

## 2013-07-25 DIAGNOSIS — E119 Type 2 diabetes mellitus without complications: Secondary | ICD-10-CM

## 2013-07-25 DIAGNOSIS — D696 Thrombocytopenia, unspecified: Secondary | ICD-10-CM

## 2013-07-25 LAB — COMPREHENSIVE METABOLIC PANEL (CC13)
ALBUMIN: 3.3 g/dL — AB (ref 3.5–5.0)
ALK PHOS: 70 U/L (ref 40–150)
ALT: 32 U/L (ref 0–55)
AST: 37 U/L — ABNORMAL HIGH (ref 5–34)
Anion Gap: 9 mEq/L (ref 3–11)
BILIRUBIN TOTAL: 1.27 mg/dL — AB (ref 0.20–1.20)
BUN: 12.7 mg/dL (ref 7.0–26.0)
CO2: 25 meq/L (ref 22–29)
Calcium: 9 mg/dL (ref 8.4–10.4)
Chloride: 105 mEq/L (ref 98–109)
Creatinine: 0.9 mg/dL (ref 0.7–1.3)
Glucose: 205 mg/dl — ABNORMAL HIGH (ref 70–140)
POTASSIUM: 4.4 meq/L (ref 3.5–5.1)
SODIUM: 139 meq/L (ref 136–145)
TOTAL PROTEIN: 6.5 g/dL (ref 6.4–8.3)

## 2013-07-25 LAB — CBC WITH DIFFERENTIAL/PLATELET
BASO%: 0.6 % (ref 0.0–2.0)
Basophils Absolute: 0 10*3/uL (ref 0.0–0.1)
EOS%: 5.3 % (ref 0.0–7.0)
Eosinophils Absolute: 0.2 10*3/uL (ref 0.0–0.5)
HCT: 39.7 % (ref 38.4–49.9)
HGB: 13.8 g/dL (ref 13.0–17.1)
LYMPH%: 29.5 % (ref 14.0–49.0)
MCH: 35.2 pg — ABNORMAL HIGH (ref 27.2–33.4)
MCHC: 34.7 g/dL (ref 32.0–36.0)
MCV: 101.5 fL — ABNORMAL HIGH (ref 79.3–98.0)
MONO#: 0.3 10*3/uL (ref 0.1–0.9)
MONO%: 8.1 % (ref 0.0–14.0)
NEUT%: 56.5 % (ref 39.0–75.0)
NEUTROS ABS: 2.2 10*3/uL (ref 1.5–6.5)
PLATELETS: 73 10*3/uL — AB (ref 140–400)
RBC: 3.91 10*6/uL — AB (ref 4.20–5.82)
RDW: 12.6 % (ref 11.0–14.6)
WBC: 3.9 10*3/uL — AB (ref 4.0–10.3)
lymph#: 1.2 10*3/uL (ref 0.9–3.3)

## 2013-07-25 LAB — LACTATE DEHYDROGENASE (CC13): LDH: 226 U/L (ref 125–245)

## 2013-07-25 NOTE — Patient Instructions (Signed)
Follow up in 6 months 

## 2013-07-25 NOTE — Progress Notes (Addendum)
[place] Telephone:([phone]   Fax:([phone] [place], [place] [address] [place] [address]  DIAGNOSIS: Idiopathic thrombocytopenic purpura   PRIOR THERAPY: None   CURRENT THERAPY: Observation.  INTERVAL HISTORY: [name] 73 y.o. male returns to the clinic today for six-month followup visit accompanied by his wife. He has done well in the interim and has no complaints other than bilateral shoulder pain which she describes as a long-standing issue. He denies any problems with bleeding or bruising. He is been on Plavix since 2003. The patient denied having any weight loss or night sweats. He denied having any chest pain, shortness breath, cough or hemoptysis. He has repeat CBC performed earlier today and he is here for evaluation and discussion of his lab results.  MEDICAL HISTORY: Past Medical History  Diagnosis Date  . Diabetes mellitus   . Pancytopenia   . Hiatal hernia 2011    EGD  . Diverticulosis of colon (without mention of hemorrhage) 2011    Colonoscopy   . Hypertension   . ITP (idiopathic thrombocytopenic purpura)     He has been diagnosed with  . CAD (coronary artery disease)     Hx of     ALLERGIES:  is allergic to crestor; lipitor; and zocor.  MEDICATIONS:  Current Outpatient Prescriptions  Medication Sig Dispense Refill  . ACCU-CHEK AVIVA PLUS test strip As directed      . clopidogrel (PLAVIX) 75 MG tablet Take 1 tablet (75 mg total) by mouth daily.  30 tablet  11  . doxazosin (CARDURA) 2 MG tablet Take 2 mg by mouth daily.      Marland Kitchen erythromycin ophthalmic ointment       . fish oil-omega-3 fatty acids 1000 MG capsule Take 1 g by mouth daily.      . isosorbide mononitrate (IMDUR) 60 MG 24 hr tablet Take 0.5 tablets (30 mg total) by mouth daily.  15 tablet  11  . Lancets (ACCU-CHEK MULTICLIX) lancets As directed      . lisinopril (PRINIVIL,ZESTRIL) 10 MG tablet Take 10 mg by mouth  daily.      . metFORMIN (GLUCOPHAGE) 500 MG tablet Take 500 mg by mouth 2 (two) times daily with a meal.       . metoprolol tartrate (LOPRESSOR) 25 MG tablet Take 0.5 tablets (12.5 mg total) by mouth 2 (two) times daily.  30 tablet  11  . Multiple Vitamin (MULTIVITAMIN) tablet Take 1 tablet by mouth daily.        . niacin 500 MG tablet Take 500 mg by mouth 3 (three) times daily with meals.        . nitroGLYCERIN (NITROSTAT) 0.4 MG SL tablet Place 0.4 mg under the tongue every 5 (five) minutes as needed.        . tobramycin-dexamethasone (TOBRADEX) ophthalmic solution As directed      . [DISCONTINUED] carvedilol (COREG) 3.125 MG tablet Take 3.125 mg by mouth 2 (two) times daily with a meal.         No current facility-administered medications for this visit.    SURGICAL HISTORY:  Past Surgical History  Procedure Laterality Date  . Hernia repair    . Coronary angioplasty with stent placement    . US echocardiography  02-08-2007    Est. EF 40-45%  . Coronary artery bypass graft      x4 with a left internal mammary artery anastomosis to the  left anterior descending coronary artery   . Saphenous vein graft resection      graft to the first obtuse marginal, a saphenous vein graft to the first diagonal coronary artery  and a saphenous vein graft to the distal right coronary artery  . Cardiac catheterization      His last heart catheterization in May of 2009 reveals a patent LIMA  . Coronary stent placement      successful percutaneous transluminal coronary angioplasty and stenting of the left main coronary artery  . Coronary angioplasty      Successful percutaneous transluminal coronary angioplasty of the left circumflex obuse marginal vessel    REVIEW OF SYSTEMS:  A comprehensive review of systems was negative except for: Musculoskeletal: positive for arthralgias and bilateral shoulder pain   PHYSICAL EXAMINATION: General appearance: alert, cooperative and no distress Head: Normocephalic,  without obvious abnormality, atraumatic Neck: no adenopathy Lymph nodes: Cervical, supraclavicular, and axillary nodes normal. Resp: clear to auscultation bilaterally Cardio: regular rate and rhythm, S1, S2 normal, no murmur, click, rub or gallop GI: soft, non-tender; bowel sounds normal; no masses,  no organomegaly Extremities: extremities normal, atraumatic, no cyanosis or edema  ECOG PERFORMANCE STATUS: 0 - Asymptomatic  Blood pressure 111/55, pulse 64, temperature 99.1 F (37.3 C), temperature source Oral, resp. rate 18, weight 172 lb 3.2 oz (78.109 kg).  LABORATORY DATA: Lab Results  Component Value Date   WBC 3.9* 07/25/2013   HGB 13.8 07/25/2013   HCT 39.7 07/25/2013   MCV 101.5* 07/25/2013   PLT 73* 07/25/2013      Chemistry      Component Value Date/Time   NA 139 07/25/2013 0946   NA 141 03/22/2012 0625   K 4.4 07/25/2013 0946   K 3.7 03/22/2012 0625   CL 106 07/19/2012 1028   CL 107 03/22/2012 0625   CO2 25 07/25/2013 0946   CO2 27 03/22/2012 0625   BUN 12.7 07/25/2013 0946   BUN 11 03/22/2012 0625   CREATININE 0.9 07/25/2013 0946   CREATININE 0.83 03/22/2012 0625      Component Value Date/Time   CALCIUM 9.0 07/25/2013 0946   CALCIUM 9.0 03/22/2012 0625   ALKPHOS 70 07/25/2013 0946   ALKPHOS 52 01/26/2012 1249   AST 37* 07/25/2013 0946   AST 43* 01/26/2012 1249   ALT 32 07/25/2013 0946   ALT 31 01/26/2012 1249   BILITOT 1.27* 07/25/2013 0946   BILITOT 1.2 01/26/2012 1249       RADIOGRAPHIC STUDIES: No results found.   ASSESSMENT AND PLAN: This is a very pleasant 73 years old white male with history of idiopathic thrombocytopenic purpura currently on observation. His platelet count remains stable. Patient was discussed with also seen by Dr. Julien Nordmann. He will remain on observation and return in 6 months for another symptom management visit. Of note patient's total bilirubin was slightly elevated at 1.27 however he had no signs of icterus.   He was advised to call immediately if  he has any concerning symptoms in the interval.  All questions were answered. The patient knows to call the clinic with any problems, questions or concerns. We can certainly see the patient much sooner if necessary.   Carlton Adam , PA_C  ADDENDUM: Hematology/Oncology Attending: I had the face to face encounter with the patient. I recommended his care plan. This is a very pleasant 73 years old white male with history of idiopathic thrombocytopenic purpura and has been observation for the last few years with no  significant change in his platelets count. He is currently asymptomatic with no bleeding, bruises or ecchymosis. His platelets count today are 73,000. I recommended for the patient to continue on observation with repeat CBC and comprehensive metabolic panel in 6 months. He would come back for followup visit at that time. He was advised to call immediately if he has any concerning symptoms in the interval.  Disclaimer: This note was dictated with voice recognition software. Similar sounding words can inadvertently be transcribed and may not be corrected upon review.   Eilleen Kempf., MD 07/26/2013

## 2013-07-27 ENCOUNTER — Telehealth: Payer: Self-pay | Admitting: *Deleted

## 2013-07-27 NOTE — Telephone Encounter (Signed)
Lm gv appt for 01/23/14 w/ labs@ 9am and ov@ 9:30am. Pt is aware that i will mail a letter/avs...td

## 2013-09-25 ENCOUNTER — Telehealth: Payer: Self-pay | Admitting: *Deleted

## 2013-09-25 NOTE — Telephone Encounter (Signed)
[employer] denied tier exception for patients metoprolol tartrate due to it is a tier 2 and there is not an alternative on the tier 1 formulary therefore they are unable to offer a lower tier.

## 2013-09-25 NOTE — Telephone Encounter (Signed)
[employer] denied tier exception for patients isosorbide mononitrate due to it is a tier 2 and there is not an alternative drug on the tier 1 formulary.

## 2013-09-25 NOTE — Telephone Encounter (Signed)
[employer] denied tier exception for patients clopidogrel due to it is a tier 2 and there is not an alternative on the tier 1 formulary therefore they are unable to offer a lower tier.

## 2013-11-27 ENCOUNTER — Encounter: Payer: Self-pay | Admitting: Cardiovascular Disease

## 2013-12-12 ENCOUNTER — Encounter: Payer: Self-pay | Admitting: Cardiovascular Disease

## 2013-12-12 ENCOUNTER — Encounter (INDEPENDENT_AMBULATORY_CARE_PROVIDER_SITE_OTHER): Payer: Self-pay

## 2013-12-12 ENCOUNTER — Ambulatory Visit (INDEPENDENT_AMBULATORY_CARE_PROVIDER_SITE_OTHER): Payer: Medicare HMO | Admitting: Cardiovascular Disease

## 2013-12-12 VITALS — BP 104/56 | HR 67 | Ht 70.0 in | Wt 179.0 lb

## 2013-12-12 DIAGNOSIS — I10 Essential (primary) hypertension: Secondary | ICD-10-CM

## 2013-12-12 DIAGNOSIS — I2510 Atherosclerotic heart disease of native coronary artery without angina pectoris: Secondary | ICD-10-CM

## 2013-12-12 DIAGNOSIS — I509 Heart failure, unspecified: Secondary | ICD-10-CM

## 2013-12-12 MED ORDER — ISOSORBIDE MONONITRATE ER 30 MG PO TB24: 30.0000 mg | ORAL_TABLET | Freq: Every day | ORAL | Status: DC

## 2013-12-12 NOTE — Assessment & Plan Note (Signed)
[name] is doing well. Has history of coronary artery bypass grafting and then had myocardial infarction and ultimately has a stent in his left main. We discussed stop the Plavix but given the fact that he has a stent in the left main I would recommend that he continue Plavix lifelong.  His lipids are little bit elevated. We discussed sending him to lipid clinic. He'll think about this decision.  I'll see him again in 6 months for followup visit.

## 2013-12-12 NOTE — Patient Instructions (Signed)
Your physician has recommended you make the following change in your medication:  STOP Cardura 2 mg CHANGE Imdur to 30 mg once daily  Your physician wants you to follow-up in: 6 months with Dr. Acie Fredrickson.  You will receive a reminder letter in the mail two months in advance. If you don't receive a letter, please call our office to schedule the follow-up appointment.

## 2013-12-12 NOTE — Progress Notes (Signed)
[name] Date of Birth  Jun 10, 1941       [facility] Office [address] [place], [place], [place]  [address]   [place],   [address] [phone]     [phone]   Fax  [phone]    Fax [phone]  Problem List: 1. Coronary artery disease-status post MI and eventually s/p  CABG and also stenting 2. Hyperlipidemia-he has been generally intolerant of statins-Crestor causes muscle pain, Lipitor causes memory loss, Zocor causes muscle pain. 2. Diabetes mellitus  History of Present Illness: [name] is an 73 year old gentleman with a history of coronary artery disease. He status post coronary artery bypass grafting. He is also status post PTCA and stenting. He also has a  history of hyperlipidemia and diabetes mellitus.   He is exercising regularly .  He does lots of yard work.  He enjoys going to his beach house.  January 05, 2013:  [name] is doing well.  His garden is doing well.  Occasional CP and dyspnea.  Pains last only for a few seconds.   Does not have to take a NTG.  Mows the lawn and does yard work without problems.   Dec. 16, 2014:  [name] is doing well.  Doing yard work without problems.    Occasional episodes  December 12, 2013:  [name] is doing ok.    Rare CP .  Frequent head aches    Current Outpatient Prescriptions on File Prior to Visit  Medication Sig Dispense Refill  . ACCU-CHEK AVIVA PLUS test strip As directed      . clopidogrel (PLAVIX) 75 MG tablet Take 1 tablet (75 mg total) by mouth daily.  30 tablet  11  . doxazosin (CARDURA) 2 MG tablet Take 2 mg by mouth daily.      Marland Kitchen erythromycin ophthalmic ointment       . fish oil-omega-3 fatty acids 1000 MG capsule Take 1 g by mouth daily.      . isosorbide mononitrate (IMDUR) 60 MG 24 hr tablet Take 0.5 tablets (30 mg total) by mouth daily.  15 tablet  11  . Lancets (ACCU-CHEK MULTICLIX) lancets As directed      . lisinopril  (PRINIVIL,ZESTRIL) 10 MG tablet Take 10 mg by mouth daily.      . metFORMIN (GLUCOPHAGE) 500 MG tablet Take 500 mg by mouth 2 (two) times daily with a meal.       . metoprolol tartrate (LOPRESSOR) 25 MG tablet Take 0.5 tablets (12.5 mg total) by mouth 2 (two) times daily.  30 tablet  11  . Multiple Vitamin (MULTIVITAMIN) tablet Take 1 tablet by mouth daily.        . niacin 500 MG tablet Take 500 mg by mouth 3 (three) times daily with meals.        . nitroGLYCERIN (NITROSTAT) 0.4 MG SL tablet Place 0.4 mg under the tongue every 5 (five) minutes as needed.        . tobramycin-dexamethasone (TOBRADEX) ophthalmic solution As directed      . [DISCONTINUED] carvedilol (COREG) 3.125 MG tablet Take 3.125 mg by mouth 2 (two) times daily with a meal.         No current facility-administered medications on file prior to visit.    Allergies  Allergen Reactions  . Crestor [Rosuvastatin Calcium]     Causes Muscle Pain  . Lipitor [Atorvastatin Calcium]     Causes  memory loss  . Zocor [Simvastatin]     Causes muscle pain    Past Medical History  Diagnosis Date  . Diabetes mellitus   . Pancytopenia   . Hiatal hernia 2011    EGD  . Diverticulosis of colon (without mention of hemorrhage) 2011    Colonoscopy   . Hypertension   . ITP (idiopathic thrombocytopenic purpura)     He has been diagnosed with  . CAD (coronary artery disease)     Hx of     Past Surgical History  Procedure Laterality Date  . Hernia repair    . Coronary angioplasty with stent placement    . US echocardiography  02-08-2007    Est. EF 40-45%  . Coronary artery bypass graft      x4 with a left internal mammary artery anastomosis to the left anterior descending coronary artery   . Saphenous vein graft resection      graft to the first obtuse marginal, a saphenous vein graft to the first diagonal coronary artery  and a saphenous vein graft to the distal right coronary artery  . Cardiac catheterization      His last heart  catheterization in May of 2009 reveals a patent LIMA  . Coronary stent placement      successful percutaneous transluminal coronary angioplasty and stenting of the left main coronary artery  . Coronary angioplasty      Successful percutaneous transluminal coronary angioplasty of the left circumflex obuse marginal vessel    History  Smoking status  . Never Smoker   Smokeless tobacco  . Never Used    History  Alcohol Use No    Family History  Problem Relation Age of Onset  . Colon cancer Neg Hx     Reviw of Systems:  Reviewed in the HPI.  All other systems are negative.  Physical Exam: Blood pressure 104/56, pulse 67, height 5\' 10"  (1.778 m), weight 179 lb (81.194 kg). General: Well developed, well nourished, in no acute distress.  Head: Normocephalic, atraumatic, sclera non-icteric, mucus membranes are moist,   Neck: Supple. Carotids are 2 + without bruits. No JVD  Lungs: Clear bilaterally to auscultation.  Heart: regular rate.  normal  S1 S2. No murmurs, gallops or rubs.  Abdomen: Soft, non-tender, non-distended with normal bowel sounds. No hepatomegaly. No rebound/guarding. No masses.  Msk:  Strength and tone are normal  Extremities: No clubbing or cyanosis. No edema.  Distal pedal pulses are 2+ and equal bilaterally.  He has bruising on his arm.   Neuro: Alert and oriented X 3. Moves all extremities spontaneously.  Psych:  Responds to questions appropriately with a normal affect.  ECG: 12/12/2013: Normal sinus rhythm at 65 beats a minute. He has no ST or T wave changes Assessment / Plan:

## 2013-12-12 NOTE — Assessment & Plan Note (Signed)
His ejection fraction has generally improved. He's fairly asymptomatic. His last EF is 50-55%.  Continue current dose of lisinopril and carvedilol. We will stop the Cardura for now. He does not need it at this time.

## 2014-01-17 ENCOUNTER — Other Ambulatory Visit: Payer: Self-pay | Admitting: Cardiovascular Disease

## 2014-01-23 ENCOUNTER — Other Ambulatory Visit (HOSPITAL_BASED_OUTPATIENT_CLINIC_OR_DEPARTMENT_OTHER): Payer: Medicare HMO

## 2014-01-23 ENCOUNTER — Telehealth: Payer: Self-pay | Admitting: Internal Medicine

## 2014-01-23 ENCOUNTER — Encounter: Payer: Self-pay | Admitting: Internal Medicine

## 2014-01-23 ENCOUNTER — Ambulatory Visit (HOSPITAL_BASED_OUTPATIENT_CLINIC_OR_DEPARTMENT_OTHER): Payer: Medicare HMO | Admitting: Internal Medicine

## 2014-01-23 VITALS — BP 108/61 | HR 56 | Temp 97.7°F | Resp 18 | Ht 70.0 in | Wt 174.0 lb

## 2014-01-23 DIAGNOSIS — D693 Immune thrombocytopenic purpura: Secondary | ICD-10-CM

## 2014-01-23 DIAGNOSIS — D696 Thrombocytopenia, unspecified: Secondary | ICD-10-CM

## 2014-01-23 LAB — COMPREHENSIVE METABOLIC PANEL (CC13)
ALK PHOS: 50 U/L (ref 40–150)
ALT: 21 U/L (ref 0–55)
AST: 26 U/L (ref 5–34)
Albumin: 3.2 g/dL — ABNORMAL LOW (ref 3.5–5.0)
Anion Gap: 8 mEq/L (ref 3–11)
BUN: 14.5 mg/dL (ref 7.0–26.0)
CALCIUM: 9.2 mg/dL (ref 8.4–10.4)
CHLORIDE: 103 meq/L (ref 98–109)
CO2: 26 mEq/L (ref 22–29)
CREATININE: 0.9 mg/dL (ref 0.7–1.3)
GLUCOSE: 164 mg/dL — AB (ref 70–140)
POTASSIUM: 4.1 meq/L (ref 3.5–5.1)
Sodium: 138 mEq/L (ref 136–145)
Total Bilirubin: 1.49 mg/dL — ABNORMAL HIGH (ref 0.20–1.20)
Total Protein: 6.8 g/dL (ref 6.4–8.3)

## 2014-01-23 LAB — CBC WITH DIFFERENTIAL/PLATELET
BASO%: 0.4 % (ref 0.0–2.0)
BASOS ABS: 0 10*3/uL (ref 0.0–0.1)
EOS ABS: 0.3 10*3/uL (ref 0.0–0.5)
EOS%: 4.4 % (ref 0.0–7.0)
HCT: 38.7 % (ref 38.4–49.9)
HEMOGLOBIN: 13.6 g/dL (ref 13.0–17.1)
LYMPH%: 20 % (ref 14.0–49.0)
MCH: 33.7 pg — ABNORMAL HIGH (ref 27.2–33.4)
MCHC: 35.1 g/dL (ref 32.0–36.0)
MCV: 96 fL (ref 79.3–98.0)
MONO#: 0.4 10*3/uL (ref 0.1–0.9)
MONO%: 7.4 % (ref 0.0–14.0)
NEUT%: 67.8 % (ref 39.0–75.0)
NEUTROS ABS: 3.8 10*3/uL (ref 1.5–6.5)
Platelets: 114 10*3/uL — ABNORMAL LOW (ref 140–400)
RBC: 4.03 10*6/uL — ABNORMAL LOW (ref 4.20–5.82)
RDW: 13.2 % (ref 11.0–14.6)
WBC: 5.7 10*3/uL (ref 4.0–10.3)
lymph#: 1.1 10*3/uL (ref 0.9–3.3)

## 2014-01-23 LAB — LACTATE DEHYDROGENASE (CC13): LDH: 233 U/L (ref 125–245)

## 2014-01-23 NOTE — Progress Notes (Signed)
[place] [phone]   Fax:(336) [place], MD [address] [place] [address]  DIAGNOSIS: Idiopathic thrombocytopenic purpura   PRIOR THERAPY: None   CURRENT THERAPY: Observation.  INTERVAL HISTORY: [name] 73 y.o. male returns to the clinic today for six-month followup visit accompanied by his wife. He was seen recently by his primary care physician and his annual evaluation was unremarkable. The patient is feeling fine today with no specific complaints. He denied having any significant bleeding issues but continues to have some ecchymosis in the upper extremities. The patient denied having any weight loss or night sweats. He denied having any chest pain, shortness breath, cough or hemoptysis. He has repeat CBC performed earlier today and he is here for evaluation and discussion of his lab results.  MEDICAL HISTORY: Past Medical History  Diagnosis Date  . Diabetes mellitus   . Pancytopenia   . Hiatal hernia 2011    EGD  . Diverticulosis of colon (without mention of hemorrhage) 2011    Colonoscopy   . Hypertension   . ITP (idiopathic thrombocytopenic purpura)     He has been diagnosed with  . CAD (coronary artery disease)     Hx of     ALLERGIES:  is allergic to crestor; lipitor; and zocor.  MEDICATIONS:  Current Outpatient Prescriptions  Medication Sig Dispense Refill  . ACCU-CHEK AVIVA PLUS test strip As directed      . clopidogrel (PLAVIX) 75 MG tablet TAKE 1 TABLET ONCE DAILY.  30 tablet  5  . fish oil-omega-3 fatty acids 1000 MG capsule Take 1 g by mouth daily.      . isosorbide mononitrate (IMDUR) 30 MG 24 hr tablet Take 1 tablet (30 mg total) by mouth daily.  90 tablet  3  . Lancets (ACCU-CHEK MULTICLIX) lancets As directed      . lisinopril (PRINIVIL,ZESTRIL) 10 MG tablet Take 10 mg by mouth daily.      . metFORMIN (GLUCOPHAGE) 500 MG tablet Take 500 mg by mouth 2 (two) times  daily with a meal.       . metoprolol tartrate (LOPRESSOR) 25 MG tablet TAKE 1/2 TABLET BY MOUTH TWICE DAILY.  30 tablet  5  . Multiple Vitamin (MULTIVITAMIN) tablet Take 1 tablet by mouth daily.        . niacin 500 MG tablet Take 500 mg by mouth 3 (three) times daily with meals.        . metoprolol tartrate (LOPRESSOR) 25 MG tablet Take 0.5 tablets (12.5 mg total) by mouth 2 (two) times daily.  30 tablet  11  . nitroGLYCERIN (NITROSTAT) 0.4 MG SL tablet Place 0.4 mg under the tongue every 5 (five) minutes as needed.        . [DISCONTINUED] carvedilol (COREG) 3.125 MG tablet Take 3.125 mg by mouth 2 (two) times daily with a meal.         No current facility-administered medications for this visit.    SURGICAL HISTORY:  Past Surgical History  Procedure Laterality Date  . Hernia repair    . Coronary angioplasty with stent placement    . US echocardiography  02-08-2007    Est. EF 40-45%  . Coronary artery bypass graft      x4 with a left internal mammary artery anastomosis to the left anterior descending coronary artery   . Saphenous vein graft resection      graft to the  first obtuse marginal, a saphenous vein graft to the first diagonal coronary artery  and a saphenous vein graft to the distal right coronary artery  . Cardiac catheterization      His last heart catheterization in May of 2009 reveals a patent LIMA  . Coronary stent placement      successful percutaneous transluminal coronary angioplasty and stenting of the left main coronary artery  . Coronary angioplasty      Successful percutaneous transluminal coronary angioplasty of the left circumflex obuse marginal vessel    REVIEW OF SYSTEMS:  A comprehensive review of systems was negative.   PHYSICAL EXAMINATION: General appearance: alert, cooperative and no distress Head: Normocephalic, without obvious abnormality, atraumatic Neck: no adenopathy Lymph nodes: Cervical, supraclavicular, and axillary nodes normal. Resp: clear  to auscultation bilaterally Cardio: regular rate and rhythm, S1, S2 normal, no murmur, click, rub or gallop GI: soft, non-tender; bowel sounds normal; no masses,  no organomegaly Extremities: extremities normal, atraumatic, no cyanosis or edema  ECOG PERFORMANCE STATUS: 0 - Asymptomatic  Blood pressure 108/61, pulse 56, temperature 97.7 F (36.5 C), temperature source Oral, resp. rate 18, height 5\' 10"  (1.778 m), weight 174 lb (78.926 kg).  LABORATORY DATA: Lab Results  Component Value Date   WBC 5.7 01/23/2014   HGB 13.6 01/23/2014   HCT 38.7 01/23/2014   MCV 96.0 01/23/2014   PLT 114* 01/23/2014      Chemistry      Component Value Date/Time   NA 138 01/23/2014 0847   NA 141 03/22/2012 0625   K 4.1 01/23/2014 0847   K 3.7 03/22/2012 0625   CL 106 07/19/2012 1028   CL 107 03/22/2012 0625   CO2 26 01/23/2014 0847   CO2 27 03/22/2012 0625   BUN 14.5 01/23/2014 0847   BUN 11 03/22/2012 0625   CREATININE 0.9 01/23/2014 0847   CREATININE 0.83 03/22/2012 0625      Component Value Date/Time   CALCIUM 9.2 01/23/2014 0847   CALCIUM 9.0 03/22/2012 0625   ALKPHOS 50 01/23/2014 0847   ALKPHOS 52 01/26/2012 1249   AST 26 01/23/2014 0847   AST 43* 01/26/2012 1249   ALT 21 01/23/2014 0847   ALT 31 01/26/2012 1249   BILITOT 1.49* 01/23/2014 0847   BILITOT 1.2 01/26/2012 1249       RADIOGRAPHIC STUDIES: No results found.  ASSESSMENT AND PLAN: This is a very pleasant 73 years old white male with history of idiopathic thrombocytopenic purpura currently on observation. His platelets count are better today up to 114,000. The patient denied having any significant bleeding issues but has some bruises or ecchymosis in the upper extremities. I recommended for him to continue on observation for now. I would see him back for followup visit in 6 months with repeat CBC. He was advised to call immediately if he has any concerning symptoms in the interval.  All questions were answered. The patient knows to call the  clinic with any problems, questions or concerns. We can certainly see the patient much sooner if necessary.  Disclaimer: This note was dictated with voice recognition software. Similar sounding words can inadvertently be transcribed and may not be corrected upon review.

## 2014-01-23 NOTE — Telephone Encounter (Signed)
gv adn rpinted appt sched and avs for pt for Jan 2016

## 2014-06-12 ENCOUNTER — Ambulatory Visit (INDEPENDENT_AMBULATORY_CARE_PROVIDER_SITE_OTHER): Payer: Medicare HMO | Admitting: Cardiovascular Disease

## 2014-06-12 ENCOUNTER — Encounter: Payer: Self-pay | Admitting: Cardiovascular Disease

## 2014-06-12 VITALS — BP 96/60 | HR 62 | Ht 70.0 in | Wt 173.6 lb

## 2014-06-12 DIAGNOSIS — I5020 Unspecified systolic (congestive) heart failure: Secondary | ICD-10-CM

## 2014-06-12 DIAGNOSIS — I2510 Atherosclerotic heart disease of native coronary artery without angina pectoris: Secondary | ICD-10-CM

## 2014-06-12 DIAGNOSIS — I10 Essential (primary) hypertension: Secondary | ICD-10-CM

## 2014-06-12 NOTE — Progress Notes (Signed)
[name] Date of Birth  1940/12/08       [facility] Office [address], [place], [place]  [address]   [place], [place]  [address] [phone]     [phone]   Fax  [phone]    Fax [phone]  Problem List: 1. Coronary artery disease-status post MI and eventually s/p  CABG and also stenting 2. Hyperlipidemia-he has been generally intolerant of statins-Crestor causes muscle pain, Lipitor causes memory loss, Zocor causes muscle pain. 2. Diabetes mellitus  History of Present Illness: [name] is an 73 year old gentleman with a history of coronary artery disease. He status post coronary artery bypass grafting. He is also status post PTCA and stenting. He also has a  history of hyperlipidemia and diabetes mellitus.   He is exercising regularly .  He does lots of yard work.  He enjoys going to his beach house.  January 05, 2013:  [name] is doing well.  His garden is doing well.  Occasional CP and dyspnea.  Pains last only for a few seconds.   Does not have to take a NTG.  Mows the lawn and does yard work without problems.   Dec. 16, 2014:  [name] is doing well.  Doing yard work without problems.    Occasional episodes  December 12, 2013:  [name] is doing ok.    Rare CP .  Frequent head aches  Dec. 1 , 2015:  No cardiac complaints.  Aches all over.  "staggers "  when he walks or stands. No angina.    He notices  That he has occasional episodes of dyspnea.      Current Outpatient Prescriptions on File Prior to Visit  Medication Sig Dispense Refill  . ACCU-CHEK AVIVA PLUS test strip As directed    . clopidogrel (PLAVIX) 75 MG tablet TAKE 1 TABLET ONCE DAILY. 30 tablet 5  . fish oil-omega-3 fatty acids 1000 MG capsule Take 1 g by mouth daily.    . isosorbide mononitrate (IMDUR) 30 MG 24 hr tablet Take 1 tablet (30 mg total) by mouth daily. 90 tablet 3  . Lancets (ACCU-CHEK MULTICLIX) lancets As  directed    . lisinopril (PRINIVIL,ZESTRIL) 10 MG tablet Take 10 mg by mouth daily.    . metFORMIN (GLUCOPHAGE) 500 MG tablet Take 500 mg by mouth 2 (two) times daily with a meal.     . metoprolol tartrate (LOPRESSOR) 25 MG tablet TAKE 1/2 TABLET BY MOUTH TWICE DAILY. 30 tablet 5  . Multiple Vitamin (MULTIVITAMIN) tablet Take 1 tablet by mouth daily.      . niacin 500 MG tablet Take 500 mg by mouth 3 (three) times daily with meals.      . nitroGLYCERIN (NITROSTAT) 0.4 MG SL tablet Place 0.4 mg under the tongue every 5 (five) minutes as needed.      . [DISCONTINUED] carvedilol (COREG) 3.125 MG tablet Take 3.125 mg by mouth 2 (two) times daily with a meal.       No current facility-administered medications on file prior to visit.    Allergies  Allergen Reactions  . Crestor [Rosuvastatin Calcium]     Causes Muscle Pain  . Lipitor [Atorvastatin Calcium]     Causes memory loss  . Zocor [Simvastatin]     Causes muscle pain    Past Medical History  Diagnosis Date  . Diabetes mellitus   . Pancytopenia   .  Hiatal hernia 2011    EGD  . Diverticulosis of colon (without mention of hemorrhage) 2011    Colonoscopy   . Hypertension   . ITP (idiopathic thrombocytopenic purpura)     He has been diagnosed with  . CAD (coronary artery disease)     Hx of     Past Surgical History  Procedure Laterality Date  . Hernia repair    . Coronary angioplasty with stent placement    . US echocardiography  02-08-2007    Est. EF 40-45%  . Coronary artery bypass graft      x4 with a left internal mammary artery anastomosis to the left anterior descending coronary artery   . Saphenous vein graft resection      graft to the first obtuse marginal, a saphenous vein graft to the first diagonal coronary artery  and a saphenous vein graft to the distal right coronary artery  . Cardiac catheterization      His last heart catheterization in May of 2009 reveals a patent LIMA  . Coronary stent placement       successful percutaneous transluminal coronary angioplasty and stenting of the left main coronary artery  . Coronary angioplasty      Successful percutaneous transluminal coronary angioplasty of the left circumflex obuse marginal vessel    History  Smoking status  . Never Smoker   Smokeless tobacco  . Never Used    History  Alcohol Use No    Family History  Problem Relation Age of Onset  . Colon cancer Neg Hx     Reviw of Systems:  Reviewed in the HPI.  All other systems are negative.  Physical Exam: Blood pressure 96/60, pulse 62, height 5\' 10"  (1.778 m), weight 173 lb 9.6 oz (78.744 kg). General: Well developed, well nourished, in no acute distress.  Head: Normocephalic, atraumatic, sclera non-icteric, mucus membranes are moist,   Neck: Supple. Carotids are 2 + without bruits. No JVD  Lungs: Clear bilaterally to auscultation.  Heart: regular rate.  normal  S1 S2. No murmurs, gallops or rubs.  Abdomen: Soft, non-tender, non-distended with normal bowel sounds. No hepatomegaly. No rebound/guarding. No masses.  Msk:  Strength and tone are normal  Extremities: No clubbing or cyanosis. No edema.  Distal pedal pulses are 2+ and equal bilaterally.  He has bruising on his arm.   Neuro: Alert and oriented X 3. Moves all extremities spontaneously.  Psych:  Responds to questions appropriately with a normal affect.  ECG: 12/12/2013: Normal sinus rhythm at 65 beats a minute. He has no ST or T wave changes Assessment / Plan:

## 2014-06-12 NOTE — Assessment & Plan Note (Signed)
He has not having any CP . Continue current meds

## 2014-06-12 NOTE — Patient Instructions (Signed)
Your physician recommends that you continue on your current medications as directed. Please refer to the Current Medication list given to you today.  Your physician wants you to follow-up in: 6 months with Dr. Acie Fredrickson.  You will receive a reminder letter in the mail two months in advance. If you don't receive a letter, please call our office to schedule the follow-up appointment.  Your physician recommends that you return for lab work (cholesterol, liver,bmet) in: 6 months on the day of or a few days before your office visit with Dr. Acie Fredrickson.  You will need to FAST for this appointment - nothing to eat or drink after midnight the night before except water.

## 2014-06-12 NOTE — Assessment & Plan Note (Signed)
His LV function has normalized on medical therapy. Continue same medications.

## 2014-06-12 NOTE — Assessment & Plan Note (Signed)
His BP is actually low at this point

## 2014-07-17 ENCOUNTER — Encounter: Payer: Self-pay | Admitting: Internal Medicine

## 2014-07-17 ENCOUNTER — Telehealth: Payer: Self-pay | Admitting: Internal Medicine

## 2014-07-17 ENCOUNTER — Ambulatory Visit (HOSPITAL_BASED_OUTPATIENT_CLINIC_OR_DEPARTMENT_OTHER): Payer: Medicare HMO | Admitting: Internal Medicine

## 2014-07-17 ENCOUNTER — Other Ambulatory Visit (HOSPITAL_BASED_OUTPATIENT_CLINIC_OR_DEPARTMENT_OTHER): Payer: Medicare HMO

## 2014-07-17 VITALS — BP 126/62 | HR 60 | Temp 97.9°F | Resp 18 | Ht 70.0 in | Wt 171.1 lb

## 2014-07-17 DIAGNOSIS — D696 Thrombocytopenia, unspecified: Secondary | ICD-10-CM

## 2014-07-17 DIAGNOSIS — D693 Immune thrombocytopenic purpura: Secondary | ICD-10-CM

## 2014-07-17 LAB — CBC WITH DIFFERENTIAL/PLATELET
BASO%: 0.4 % (ref 0.0–2.0)
Basophils Absolute: 0 10*3/uL (ref 0.0–0.1)
EOS ABS: 0.2 10*3/uL (ref 0.0–0.5)
EOS%: 4.6 % (ref 0.0–7.0)
HCT: 40.4 % (ref 38.4–49.9)
HGB: 13.9 g/dL (ref 13.0–17.1)
LYMPH%: 26.5 % (ref 14.0–49.0)
MCH: 33.6 pg — ABNORMAL HIGH (ref 27.2–33.4)
MCHC: 34.4 g/dL (ref 32.0–36.0)
MCV: 97.6 fL (ref 79.3–98.0)
MONO#: 0.4 10*3/uL (ref 0.1–0.9)
MONO%: 7.4 % (ref 0.0–14.0)
NEUT%: 61.1 % (ref 39.0–75.0)
NEUTROS ABS: 3 10*3/uL (ref 1.5–6.5)
PLATELETS: 100 10*3/uL — AB (ref 140–400)
RBC: 4.14 10*6/uL — ABNORMAL LOW (ref 4.20–5.82)
RDW: 13.3 % (ref 11.0–14.6)
WBC: 5 10*3/uL (ref 4.0–10.3)
lymph#: 1.3 10*3/uL (ref 0.9–3.3)

## 2014-07-17 LAB — COMPREHENSIVE METABOLIC PANEL (CC13)
ALT: 43 U/L (ref 0–55)
AST: 70 U/L — ABNORMAL HIGH (ref 5–34)
Albumin: 3.2 g/dL — ABNORMAL LOW (ref 3.5–5.0)
Alkaline Phosphatase: 63 U/L (ref 40–150)
Anion Gap: 6 mEq/L (ref 3–11)
BILIRUBIN TOTAL: 1.26 mg/dL — AB (ref 0.20–1.20)
BUN: 12 mg/dL (ref 7.0–26.0)
CO2: 28 meq/L (ref 22–29)
CREATININE: 0.9 mg/dL (ref 0.7–1.3)
Calcium: 9 mg/dL (ref 8.4–10.4)
Chloride: 103 mEq/L (ref 98–109)
EGFR: 83 mL/min/{1.73_m2} — ABNORMAL LOW (ref >90)
Glucose: 218 mg/dl — ABNORMAL HIGH (ref 70–140)
Potassium: 4.3 mEq/L (ref 3.5–5.1)
Sodium: 137 mEq/L (ref 136–145)
Total Protein: 6.7 g/dL (ref 6.4–8.3)

## 2014-07-17 LAB — LACTATE DEHYDROGENASE (CC13): LDH: 285 U/L — ABNORMAL HIGH (ref 125–245)

## 2014-07-17 NOTE — Progress Notes (Signed)
[place] Telephone:([phone]   Fax:(336) [place], MD [address] [place] [address]  DIAGNOSIS: Idiopathic thrombocytopenic purpura   PRIOR THERAPY: None   CURRENT THERAPY: Observation.  INTERVAL HISTORY: [name] 74 y.o. male returns to the clinic today for six-month followup visit accompanied by his wife. He is on Plavix for his heart disease. The patient had one episode of bleeding from the left arm that was unnoticed for a while until he has his sleeve was soaked in blood. It resolved with pressure. The patient is feeling fine today with no specific complaints. He denied having any recent significant bleeding issues but continues to have some ecchymosis in the upper extremities. The patient denied having any weight loss or night sweats. He denied having any chest pain, shortness of breath, cough or hemoptysis. He has repeat CBC performed earlier today and he is here for evaluation and discussion of his lab results.  MEDICAL HISTORY: Past Medical History  Diagnosis Date  . Diabetes mellitus   . Pancytopenia   . Hiatal hernia 2011    EGD  . Diverticulosis of colon (without mention of hemorrhage) 2011    Colonoscopy   . Hypertension   . ITP (idiopathic thrombocytopenic purpura)     He has been diagnosed with  . CAD (coronary artery disease)     Hx of     ALLERGIES:  is allergic to crestor; lipitor; and zocor.  MEDICATIONS:  Current Outpatient Prescriptions  Medication Sig Dispense Refill  . ACCU-CHEK AVIVA PLUS test strip As directed    . clopidogrel (PLAVIX) 75 MG tablet TAKE 1 TABLET ONCE DAILY. 30 tablet 5  . fish oil-omega-3 fatty acids 1000 MG capsule Take 1 g by mouth daily.    . isosorbide mononitrate (IMDUR) 30 MG 24 hr tablet Take 1 tablet (30 mg total) by mouth daily. 90 tablet 3  . Lancets (ACCU-CHEK MULTICLIX) lancets As directed    . lisinopril (PRINIVIL,ZESTRIL) 10 MG tablet  Take 10 mg by mouth daily.    . metFORMIN (GLUCOPHAGE) 500 MG tablet Take 500 mg by mouth 2 (two) times daily with a meal.     . metoprolol tartrate (LOPRESSOR) 25 MG tablet TAKE 1/2 TABLET BY MOUTH TWICE DAILY. 30 tablet 5  . Multiple Vitamin (MULTIVITAMIN) tablet Take 1 tablet by mouth daily.      . niacin 500 MG tablet Take 500 mg by mouth 3 (three) times daily with meals.      . nitroGLYCERIN (NITROSTAT) 0.4 MG SL tablet Place 0.4 mg under the tongue every 5 (five) minutes as needed.      . [DISCONTINUED] carvedilol (COREG) 3.125 MG tablet Take 3.125 mg by mouth 2 (two) times daily with a meal.       No current facility-administered medications for this visit.    SURGICAL HISTORY:  Past Surgical History  Procedure Laterality Date  . Hernia repair    . Coronary angioplasty with stent placement    . US echocardiography  02-08-2007    Est. EF 40-45%  . Coronary artery bypass graft      x4 with a left internal mammary artery anastomosis to the left anterior descending coronary artery   . Saphenous vein graft resection      graft to the first obtuse marginal, a saphenous vein graft to the first diagonal coronary artery  and a saphenous vein graft to the distal right coronary  artery  . Cardiac catheterization      His last heart catheterization in May of 2009 reveals a patent LIMA  . Coronary stent placement      successful percutaneous transluminal coronary angioplasty and stenting of the left main coronary artery  . Coronary angioplasty      Successful percutaneous transluminal coronary angioplasty of the left circumflex obuse marginal vessel    REVIEW OF SYSTEMS:  A comprehensive review of systems was negative.   PHYSICAL EXAMINATION: General appearance: alert, cooperative and no distress Head: Normocephalic, without obvious abnormality, atraumatic Neck: no adenopathy Lymph nodes: Cervical, supraclavicular, and axillary nodes normal. Resp: clear to auscultation  bilaterally Cardio: regular rate and rhythm, S1, S2 normal, no murmur, click, rub or gallop GI: soft, non-tender; bowel sounds normal; no masses,  no organomegaly Extremities: extremities normal, atraumatic, no cyanosis or edema  ECOG PERFORMANCE STATUS: 0 - Asymptomatic  Blood pressure 126/62, pulse 60, temperature 97.9 F (36.6 C), temperature source Oral, resp. rate 18, height 5\' 10"  (1.778 m), weight 171 lb 1.6 oz (77.61 kg), SpO2 100 %.  LABORATORY DATA: Lab Results  Component Value Date   WBC 5.0 07/17/2014   HGB 13.9 07/17/2014   HCT 40.4 07/17/2014   MCV 97.6 07/17/2014   PLT 100* 07/17/2014      Chemistry      Component Value Date/Time   NA 138 01/23/2014 0847   NA 141 03/22/2012 0625   K 4.1 01/23/2014 0847   K 3.7 03/22/2012 0625   CL 106 07/19/2012 1028   CL 107 03/22/2012 0625   CO2 26 01/23/2014 0847   CO2 27 03/22/2012 0625   BUN 14.5 01/23/2014 0847   BUN 11 03/22/2012 0625   CREATININE 0.9 01/23/2014 0847   CREATININE 0.83 03/22/2012 0625      Component Value Date/Time   CALCIUM 9.2 01/23/2014 0847   CALCIUM 9.0 03/22/2012 0625   ALKPHOS 50 01/23/2014 0847   ALKPHOS 52 01/26/2012 1249   AST 26 01/23/2014 0847   AST 43* 01/26/2012 1249   ALT 21 01/23/2014 0847   ALT 31 01/26/2012 1249   BILITOT 1.49* 01/23/2014 0847   BILITOT 1.2 01/26/2012 1249       RADIOGRAPHIC STUDIES: No results found.  ASSESSMENT AND PLAN: This is a very pleasant 74 years old white male with history of idiopathic thrombocytopenic purpura currently on observation. His platelets count today are 100,000. The patient denied having any significant bleeding issues except for one episode which was likely secondary to his treatment with Plavix but has some bruises or ecchymosis in the upper extremities. I recommended for him to continue on observation for now.  I would see him back for followup visit in 6 months with repeat CBC. He was advised to call immediately if he has any  concerning symptoms in the interval.  All questions were answered. The patient knows to call the clinic with any problems, questions or concerns. We can certainly see the patient much sooner if necessary.  Disclaimer: This note was dictated with voice recognition software. Similar sounding words can inadvertently be transcribed and may not be corrected upon review.

## 2014-07-17 NOTE — Telephone Encounter (Signed)
gv and printed appt sched and avs fo rpt for July °

## 2014-07-25 ENCOUNTER — Other Ambulatory Visit: Payer: Self-pay | Admitting: Cardiovascular Disease

## 2014-10-16 ENCOUNTER — Encounter: Payer: Self-pay | Admitting: Cardiovascular Disease

## 2014-11-14 ENCOUNTER — Other Ambulatory Visit: Payer: Self-pay | Admitting: Cardiovascular Disease

## 2014-12-07 ENCOUNTER — Encounter: Payer: Self-pay | Admitting: Internal Medicine

## 2014-12-13 ENCOUNTER — Ambulatory Visit (INDEPENDENT_AMBULATORY_CARE_PROVIDER_SITE_OTHER): Payer: Medicare HMO | Admitting: Cardiovascular Disease

## 2014-12-13 ENCOUNTER — Encounter: Payer: Self-pay | Admitting: Cardiovascular Disease

## 2014-12-13 VITALS — BP 100/50 | HR 60 | Ht 70.0 in | Wt 173.2 lb

## 2014-12-13 DIAGNOSIS — I5020 Unspecified systolic (congestive) heart failure: Secondary | ICD-10-CM | POA: Diagnosis not present

## 2014-12-13 DIAGNOSIS — I2510 Atherosclerotic heart disease of native coronary artery without angina pectoris: Secondary | ICD-10-CM

## 2014-12-13 MED ORDER — LISINOPRIL 5 MG PO TABS: 5.0000 mg | ORAL_TABLET | Freq: Every day | ORAL | Status: DC

## 2014-12-13 MED ORDER — METOPROLOL TARTRATE 25 MG PO TABS
12.5000 mg | ORAL_TABLET | Freq: Two times a day (BID) | ORAL | Status: DC
Start: 2014-12-13 — End: 2015-11-12

## 2014-12-13 NOTE — Patient Instructions (Signed)
Medication Instructions:  DECREASE Lisinopril to 5 mg once daily  Labwork: Your physician recommends that you return for lab work in: 6 months on the day of or a few days before your office visit with Dr. Acie Fredrickson.  You will need to FAST for this appointment - nothing to eat or drink after midnight the night before except water.   Testing/Procedures: None Ordered  Follow-Up: Your physician wants you to follow-up in: 6 months with Dr. Acie Fredrickson.  You will receive a reminder letter in the mail two months in advance. If you don't receive a letter, please call our office to schedule the follow-up appointment.

## 2014-12-13 NOTE — Progress Notes (Signed)
Cardiology Office Note   Date:  12/13/2014   ID:  [name], DOB June 10, 1941, MRN [id number]  PCP:  Lillard Anes, MD  Cardiologist:   Thayer Headings, MD   Chief Complaint  Patient presents with  . Coronary Artery Disease   1. Coronary artery disease-status post MI and eventually s/p CABG and also stenting 2. Hyperlipidemia-he has been generally intolerant of statins-Crestor causes muscle pain, Lipitor causes memory loss, Zocor causes muscle pain. 2. Diabetes mellitus  History of Present Illness: [name] is an 74 year old gentleman with a history of coronary artery disease. He status post coronary artery bypass grafting. He is also status post PTCA and stenting. He also has a history of hyperlipidemia and diabetes mellitus.   He is exercising regularly . He does lots of yard work. He enjoys going to his beach house.  January 05, 2013:  [name] is doing well. His garden is doing well. Occasional CP and dyspnea. Pains last only for a few seconds. Does not have to take a NTG. Mows the lawn and does yard work without problems.   Dec. 16, 2014:  [name] is doing well. Doing yard work without problems. Occasional episodes  December 12, 2013: [name] is doing ok. Rare CP . Frequent head aches  Dec. 1 , 2015:  No cardiac complaints.  Aches all over. "staggers " when he walks or stands. No angina. He notices That he has occasional episodes of dyspnea.    December 13, 2014:  [name] is a 74 y.o. male who presents for his  CAD. Very rare CP.  Has some DOE climbing up his hill at his house.   Past Medical History  Diagnosis Date  . Diabetes mellitus   . Pancytopenia   . Hiatal hernia 2011    EGD  . Diverticulosis of colon (without mention of hemorrhage) 2011    Colonoscopy   . Hypertension   . ITP (idiopathic thrombocytopenic purpura)     He has been diagnosed with  . CAD (coronary artery disease)     Hx of     Past  Surgical History  Procedure Laterality Date  . Hernia repair    . Coronary angioplasty with stent placement    . US echocardiography  02-08-2007    Est. EF 40-45%  . Coronary artery bypass graft      x4 with a left internal mammary artery anastomosis to the left anterior descending coronary artery   . Saphenous vein graft resection      graft to the first obtuse marginal, a saphenous vein graft to the first diagonal coronary artery  and a saphenous vein graft to the distal right coronary artery  . Cardiac catheterization      His last heart catheterization in May of 2009 reveals a patent LIMA  . Coronary stent placement      successful percutaneous transluminal coronary angioplasty and stenting of the left main coronary artery  . Coronary angioplasty      Successful percutaneous transluminal coronary angioplasty of the left circumflex obuse marginal vessel     Current Outpatient Prescriptions  Medication Sig Dispense Refill  . ACCU-CHEK AVIVA PLUS test strip As directed    . clopidogrel (PLAVIX) 75 MG tablet TAKE 1 TABLET ONCE DAILY. 30 tablet 6  . fish oil-omega-3 fatty acids 1000 MG capsule Take 1 g by mouth daily.    . isosorbide mononitrate (IMDUR) 30 MG 24 hr tablet TAKE 1 TABLET ONCE DAILY. Wright  tablet 3  . Lancets (ACCU-CHEK MULTICLIX) lancets As directed    . metFORMIN (GLUCOPHAGE) 500 MG tablet Take 500 mg by mouth 2 (two) times daily with a meal.     . metoprolol tartrate (LOPRESSOR) 25 MG tablet Take 0.5 tablets (12.5 mg total) by mouth 2 (two) times daily. 90 tablet 3  . Multiple Vitamin (MULTIVITAMIN) tablet Take 1 tablet by mouth daily.      . niacin 500 MG tablet Take 500 mg by mouth 3 (three) times daily with meals.      . nitroGLYCERIN (NITROSTAT) 0.4 MG SL tablet Place 0.4 mg under the tongue every 5 (five) minutes as needed.      Marland Kitchen lisinopril (PRINIVIL,ZESTRIL) 5 MG tablet Take 1 tablet (5 mg total) by mouth daily. 90 tablet 3  . [DISCONTINUED] carvedilol (COREG) 3.125  MG tablet Take 3.125 mg by mouth 2 (two) times daily with a meal.       No current facility-administered medications for this visit.    Allergies:   Crestor; Lipitor; and Zocor    Social History:  The patient  reports that he has never smoked. He has never used smokeless tobacco. He reports that he does not drink alcohol or use illicit drugs.   Family History:  The patient's family history is negative for Colon cancer.    ROS:  Please see the history of present illness.    Review of Systems: Constitutional:  denies fever, chills, diaphoresis, appetite change and fatigue.  HEENT: denies photophobia, eye pain, redness, hearing loss, ear pain, congestion, sore throat, rhinorrhea, sneezing, neck pain, neck stiffness and tinnitus.  Respiratory: denies SOB, DOE, cough, chest tightness, and wheezing.  Cardiovascular: denies chest pain, palpitations and leg swelling.  Gastrointestinal: denies nausea, vomiting, abdominal pain, diarrhea, constipation, blood in stool.  Genitourinary: denies dysuria, urgency, frequency, hematuria, flank pain and difficulty urinating.  Musculoskeletal: denies  myalgias, back pain, joint swelling, arthralgias and gait problem.   Skin: denies pallor, rash and wound.  Neurological: denies dizziness, seizures, syncope, weakness, light-headedness, numbness and headaches.   Hematological: denies adenopathy, easy bruising, personal or family bleeding history.  Psychiatric/ Behavioral: denies suicidal ideation, mood changes, confusion, nervousness, sleep disturbance and agitation.       All other systems are reviewed and negative.    PHYSICAL EXAM: VS:  BP 100/50 mmHg  Pulse 60  Ht 5\' 10"  (1.778 m)  Wt 78.563 kg (173 lb 3.2 oz)  BMI 24.85 kg/m2 , BMI Body mass index is 24.85 kg/(m^2). GEN: Well nourished, well developed, in no acute distress HEENT: normal Neck: no JVD, carotid bruits, or masses Cardiac: RRR; no murmurs, rubs, or gallops,no edema  Respiratory:   Klein to auscultation bilaterally, normal work of breathing GI: soft, nontender, nondistended, + BS MS: no deformity or atrophy Skin: warm and dry, no rash Neuro:  Strength and sensation are intact Psych: normal   EKG:  EKG is ordered today. The ekg ordered today demonstrates ::   NSR at 60.  Occasional PVCs   Recent Labs: 07/17/2014: ALT 43; BUN 12.0; Creatinine 0.9; Hemoglobin 13.9; Platelets 100*; Potassium 4.3; Sodium 137    Lipid Panel No results found for: CHOL, TRIG, HDL, CHOLHDL, VLDL, LDLCALC, LDLDIRECT    Wt Readings from Last 3 Encounters:  12/13/14 78.563 kg (173 lb 3.2 oz)  07/17/14 77.61 kg (171 lb 1.6 oz)  06/12/14 78.744 kg (173 lb 9.6 oz)      Other studies Reviewed: Additional studies/ records that were reviewed today  include: . Review of the above records demonstrates:    ASSESSMENT AND PLAN:  1. Coronary artery disease-status post MI and eventually s/p CABG and also stenting - he is doing well , no angina  2. Hyperlipidemia-he has been generally intolerant of statins-Crestor causes muscle pain, Lipitor causes memory loss, Zocor causes muscle pain. 2. Diabetes mellitus - managed by his medical doctor     Current medicines are reviewed at length with the patient today.  The patient does not have concerns regarding medicines.  The following changes have been made:  no change  Labs/ tests ordered today include:   Orders Placed This Encounter  Procedures  . EKG 12-Lead     Disposition:   FU with me in 6 months with fasting labs.       Cinzia Devos, Wonda Cheng, MD  12/13/2014 8:54 AM    [facility] [place], [place], [place]  [address] Phone: [phone]; Fax: [phone]   [facility]  [address] [place], [place]  [address] [phone]    Fax [phone]

## 2014-12-24 ENCOUNTER — Telehealth: Payer: Self-pay | Admitting: Internal Medicine

## 2014-12-24 NOTE — Telephone Encounter (Signed)
AM PAL - moved 7/5 appointments to PM. Left message for patient and mailed schedule.

## 2015-01-15 ENCOUNTER — Other Ambulatory Visit (HOSPITAL_BASED_OUTPATIENT_CLINIC_OR_DEPARTMENT_OTHER): Payer: Medicare HMO

## 2015-01-15 ENCOUNTER — Ambulatory Visit (HOSPITAL_BASED_OUTPATIENT_CLINIC_OR_DEPARTMENT_OTHER): Payer: Medicare HMO | Admitting: Internal Medicine

## 2015-01-15 ENCOUNTER — Telehealth: Payer: Self-pay | Admitting: Internal Medicine

## 2015-01-15 ENCOUNTER — Encounter: Payer: Self-pay | Admitting: Internal Medicine

## 2015-01-15 VITALS — BP 106/51 | HR 77 | Temp 98.7°F | Resp 18 | Ht 70.0 in | Wt 172.1 lb

## 2015-01-15 DIAGNOSIS — D693 Immune thrombocytopenic purpura: Secondary | ICD-10-CM | POA: Diagnosis not present

## 2015-01-15 DIAGNOSIS — Z862 Personal history of diseases of the blood and blood-forming organs and certain disorders involving the immune mechanism: Secondary | ICD-10-CM | POA: Diagnosis not present

## 2015-01-15 DIAGNOSIS — D61818 Other pancytopenia: Secondary | ICD-10-CM

## 2015-01-15 LAB — CBC WITH DIFFERENTIAL/PLATELET
BASO%: 0.8 % (ref 0.0–2.0)
BASOS ABS: 0 10*3/uL (ref 0.0–0.1)
EOS ABS: 0.2 10*3/uL (ref 0.0–0.5)
EOS%: 3.7 % (ref 0.0–7.0)
HEMATOCRIT: 40.5 % (ref 38.4–49.9)
HEMOGLOBIN: 14 g/dL (ref 13.0–17.1)
LYMPH%: 31.7 % (ref 14.0–49.0)
MCH: 34.5 pg — AB (ref 27.2–33.4)
MCHC: 34.5 g/dL (ref 32.0–36.0)
MCV: 99.9 fL — ABNORMAL HIGH (ref 79.3–98.0)
MONO#: 0.4 10*3/uL (ref 0.1–0.9)
MONO%: 8.3 % (ref 0.0–14.0)
NEUT%: 55.5 % (ref 39.0–75.0)
NEUTROS ABS: 2.6 10*3/uL (ref 1.5–6.5)
PLATELETS: 92 10*3/uL — AB (ref 140–400)
RBC: 4.05 10*6/uL — ABNORMAL LOW (ref 4.20–5.82)
RDW: 13.4 % (ref 11.0–14.6)
WBC: 4.6 10*3/uL (ref 4.0–10.3)
lymph#: 1.5 10*3/uL (ref 0.9–3.3)

## 2015-01-15 LAB — COMPREHENSIVE METABOLIC PANEL (CC13)
ALT: 49 U/L (ref 0–55)
AST: 45 U/L — ABNORMAL HIGH (ref 5–34)
Albumin: 3 g/dL — ABNORMAL LOW (ref 3.5–5.0)
Alkaline Phosphatase: 100 U/L (ref 40–150)
Anion Gap: 7 mEq/L (ref 3–11)
BUN: 11.5 mg/dL (ref 7.0–26.0)
CALCIUM: 8.6 mg/dL (ref 8.4–10.4)
CO2: 24 mEq/L (ref 22–29)
Chloride: 103 mEq/L (ref 98–109)
Creatinine: 0.9 mg/dL (ref 0.7–1.3)
EGFR: 84 mL/min/{1.73_m2} — AB (ref >90)
Glucose: 286 mg/dl — ABNORMAL HIGH (ref 70–140)
POTASSIUM: 3.9 meq/L (ref 3.5–5.1)
Sodium: 134 mEq/L — ABNORMAL LOW (ref 136–145)
TOTAL PROTEIN: 6.2 g/dL — AB (ref 6.4–8.3)
Total Bilirubin: 1.15 mg/dL (ref 0.20–1.20)

## 2015-01-15 LAB — LACTATE DEHYDROGENASE (CC13): LDH: 255 U/L — ABNORMAL HIGH (ref 125–245)

## 2015-01-15 NOTE — Progress Notes (Signed)
[facility] Telephone:([phone]) [phone]   Fax:(336) Richards, MD [address] [place] [address]  DIAGNOSIS: Idiopathic thrombocytopenic purpura   PRIOR THERAPY: None   CURRENT THERAPY: Observation.  INTERVAL HISTORY: [name] 74 y.o. male returns to the clinic today for six-month followup visit accompanied by his wife. The patient is feeling fine today with no specific complaints. He denied having any recent significant bleeding issues but continues to have some ecchymosis in the upper extremities and he is currently on treatment with Plavix for his heart disease. The patient denied having any weight loss or night sweats. He denied having any chest pain, shortness of breath, cough or hemoptysis. He has repeat CBC performed earlier today and he is here for evaluation and discussion of his lab results.  MEDICAL HISTORY: Past Medical History  Diagnosis Date  . Diabetes mellitus   . Pancytopenia   . Hiatal hernia 2011    EGD  . Diverticulosis of colon (without mention of hemorrhage) 2011    Colonoscopy   . Hypertension   . ITP (idiopathic thrombocytopenic purpura)     He has been diagnosed with  . CAD (coronary artery disease)     Hx of     ALLERGIES:  is allergic to crestor; lipitor; and zocor.  MEDICATIONS:  Current Outpatient Prescriptions  Medication Sig Dispense Refill  . ACCU-CHEK AVIVA PLUS test strip As directed    . clopidogrel (PLAVIX) 75 MG tablet TAKE 1 TABLET ONCE DAILY. 30 tablet 6  . fish oil-omega-3 fatty acids 1000 MG capsule Take 1 g by mouth daily.    . isosorbide mononitrate (IMDUR) 30 MG 24 hr tablet TAKE 1 TABLET ONCE DAILY. 90 tablet 3  . Lancets (ACCU-CHEK MULTICLIX) lancets As directed    . lisinopril (PRINIVIL,ZESTRIL) 10 MG tablet     . metFORMIN (GLUCOPHAGE) 500 MG tablet Take 500 mg by mouth 2 (two) times daily with a meal.     . metoprolol tartrate (LOPRESSOR) 25 MG tablet  Take 0.5 tablets (12.5 mg total) by mouth 2 (two) times daily. 90 tablet 3  . Multiple Vitamin (MULTIVITAMIN) tablet Take 1 tablet by mouth daily.      Marland Kitchen neomycin-polymyxin b-dexamethasone (MAXITROL) 3.5-10000-0.1 OINT APPLY TO EYELID INTO LEFT EYE AT BEDTIME FOR 2 WEEKS  1  . niacin 500 MG tablet Take 500 mg by mouth 3 (three) times daily with meals.      . nitroGLYCERIN (NITROSTAT) 0.4 MG SL tablet Place 0.4 mg under the tongue every 5 (five) minutes as needed.      Marland Kitchen VIGAMOX 0.5 % ophthalmic solution     . [DISCONTINUED] carvedilol (COREG) 3.125 MG tablet Take 3.125 mg by mouth 2 (two) times daily with a meal.       No current facility-administered medications for this visit.    SURGICAL HISTORY:  Past Surgical History  Procedure Laterality Date  . Hernia repair    . Coronary angioplasty with stent placement    . US echocardiography  02-08-2007    Est. EF 40-45%  . Coronary artery bypass graft      x4 with a left internal mammary artery anastomosis to the left anterior descending coronary artery   . Saphenous vein graft resection      graft to the first obtuse marginal, a saphenous vein graft to the first diagonal coronary artery  and a saphenous vein graft to the distal right coronary  artery  . Cardiac catheterization      His last heart catheterization in May of 2009 reveals a patent LIMA  . Coronary stent placement      successful percutaneous transluminal coronary angioplasty and stenting of the left main coronary artery  . Coronary angioplasty      Successful percutaneous transluminal coronary angioplasty of the left circumflex obuse marginal vessel    REVIEW OF SYSTEMS:  A comprehensive review of systems was negative.   PHYSICAL EXAMINATION: General appearance: alert, cooperative and no distress Head: Normocephalic, without obvious abnormality, atraumatic Neck: no adenopathy Lymph nodes: Cervical, supraclavicular, and axillary nodes normal. Resp: clear to auscultation  bilaterally Cardio: regular rate and rhythm, S1, S2 normal, no murmur, click, rub or gallop GI: soft, non-tender; bowel sounds normal; no masses,  no organomegaly Extremities: extremities normal, atraumatic, no cyanosis or edema  ECOG PERFORMANCE STATUS: 0 - Asymptomatic  Blood pressure 106/51, pulse 77, temperature 98.7 F (37.1 C), temperature source Oral, resp. rate 18, height 5\' 10"  (1.778 m), weight 172 lb 1.6 oz (78.064 kg), SpO2 100 %.  LABORATORY DATA: Lab Results  Component Value Date   WBC 4.6 01/15/2015   HGB 14.0 01/15/2015   HCT 40.5 01/15/2015   MCV 99.9* 01/15/2015   PLT 92* 01/15/2015      Chemistry      Component Value Date/Time   NA 137 07/17/2014 0918   NA 141 03/22/2012 0625   K 4.3 07/17/2014 0918   K 3.7 03/22/2012 0625   CL 106 07/19/2012 1028   CL 107 03/22/2012 0625   CO2 28 07/17/2014 0918   CO2 27 03/22/2012 0625   BUN 12.0 07/17/2014 0918   BUN 11 03/22/2012 0625   CREATININE 0.9 07/17/2014 0918   CREATININE 0.83 03/22/2012 0625      Component Value Date/Time   CALCIUM 9.0 07/17/2014 0918   CALCIUM 9.0 03/22/2012 0625   ALKPHOS 63 07/17/2014 0918   ALKPHOS 52 01/26/2012 1249   AST 70* 07/17/2014 0918   AST 43* 01/26/2012 1249   ALT 43 07/17/2014 0918   ALT 31 01/26/2012 1249   BILITOT 1.26* 07/17/2014 0918   BILITOT 1.2 01/26/2012 1249       RADIOGRAPHIC STUDIES: No results found.  ASSESSMENT AND PLAN: This is a very pleasant 74 years old white male with history of idiopathic thrombocytopenic purpura currently on observation. His platelets count today are 92,000. The patient denied having any significant bleeding issues. I recommended for him to continue on observation for now.  I would see him back for followup visit in 6 months with repeat CBC. He was advised to call immediately if he has any concerning symptoms in the interval.  All questions were answered. The patient knows to call the clinic with any problems, questions or  concerns. We can certainly see the patient much sooner if necessary.  Disclaimer: This note was dictated with voice recognition software. Similar sounding words can inadvertently be transcribed and may not be corrected upon review.

## 2015-01-15 NOTE — Telephone Encounter (Signed)
Gave avs & calendar for January °

## 2015-02-13 ENCOUNTER — Other Ambulatory Visit: Payer: Self-pay | Admitting: Cardiovascular Disease

## 2015-04-25 DIAGNOSIS — E119 Type 2 diabetes mellitus without complications: Secondary | ICD-10-CM | POA: Diagnosis not present

## 2015-04-25 DIAGNOSIS — Z961 Presence of intraocular lens: Secondary | ICD-10-CM | POA: Diagnosis not present

## 2015-04-25 DIAGNOSIS — H0012 Chalazion right lower eyelid: Secondary | ICD-10-CM | POA: Diagnosis not present

## 2015-04-25 DIAGNOSIS — H02401 Unspecified ptosis of right eyelid: Secondary | ICD-10-CM | POA: Diagnosis not present

## 2015-04-26 DIAGNOSIS — Z23 Encounter for immunization: Secondary | ICD-10-CM | POA: Diagnosis not present

## 2015-06-03 DIAGNOSIS — E1140 Type 2 diabetes mellitus with diabetic neuropathy, unspecified: Secondary | ICD-10-CM | POA: Diagnosis not present

## 2015-06-03 DIAGNOSIS — E1142 Type 2 diabetes mellitus with diabetic polyneuropathy: Secondary | ICD-10-CM | POA: Diagnosis not present

## 2015-06-11 ENCOUNTER — Encounter: Payer: Self-pay | Admitting: Cardiovascular Disease

## 2015-06-11 ENCOUNTER — Other Ambulatory Visit (INDEPENDENT_AMBULATORY_CARE_PROVIDER_SITE_OTHER): Payer: Medicare HMO

## 2015-06-11 ENCOUNTER — Ambulatory Visit (INDEPENDENT_AMBULATORY_CARE_PROVIDER_SITE_OTHER): Payer: Medicare HMO | Admitting: Cardiovascular Disease

## 2015-06-11 VITALS — BP 110/60 | HR 52 | Ht 70.0 in | Wt 171.4 lb

## 2015-06-11 DIAGNOSIS — I5020 Unspecified systolic (congestive) heart failure: Secondary | ICD-10-CM | POA: Diagnosis not present

## 2015-06-11 DIAGNOSIS — I2510 Atherosclerotic heart disease of native coronary artery without angina pectoris: Secondary | ICD-10-CM

## 2015-06-11 LAB — HEPATIC FUNCTION PANEL
ALBUMIN: 3 g/dL — AB (ref 3.6–5.1)
ALK PHOS: 60 U/L (ref 40–115)
ALT: 27 U/L (ref 9–46)
AST: 30 U/L (ref 10–35)
Bilirubin, Direct: 0.4 mg/dL — ABNORMAL HIGH (ref <=0.2)
Indirect Bilirubin: 1.2 mg/dL (ref 0.2–1.2)
TOTAL PROTEIN: 6 g/dL — AB (ref 6.1–8.1)
Total Bilirubin: 1.6 mg/dL — ABNORMAL HIGH (ref 0.2–1.2)

## 2015-06-11 LAB — BASIC METABOLIC PANEL
BUN: 10 mg/dL (ref 7–25)
CHLORIDE: 104 mmol/L (ref 98–110)
CO2: 27 mmol/L (ref 20–31)
Calcium: 8.6 mg/dL (ref 8.6–10.3)
Creat: 0.75 mg/dL (ref 0.70–1.18)
GLUCOSE: 158 mg/dL — AB (ref 65–99)
Potassium: 4.2 mmol/L (ref 3.5–5.3)
SODIUM: 135 mmol/L (ref 135–146)

## 2015-06-11 LAB — LIPID PANEL
CHOL/HDL RATIO: 2.2 ratio (ref <=5.0)
Cholesterol: 129 mg/dL (ref 125–200)
HDL: 59 mg/dL (ref >=40)
LDL CALC: 57 mg/dL (ref <130)
Triglycerides: 65 mg/dL (ref <150)
VLDL: 13 mg/dL (ref <30)

## 2015-06-11 NOTE — Addendum Note (Signed)
Addended by: Stephannie Peters on: 06/11/2015 09:13 AM   Modules accepted: Orders

## 2015-06-11 NOTE — Patient Instructions (Signed)

## 2015-06-11 NOTE — Progress Notes (Signed)
Cardiology Office Note   Date:  06/11/2015   ID:  [name], DOB 1941-05-31, MRN [id number]  PCP:  Lillard Anes, MD  Cardiologist:   Thayer Headings, MD   Chief Complaint  Patient presents with  . Coronary Artery Disease   1. Coronary artery disease-status post MI and eventually s/p CABG  ( 2003) and also stenting ( 2007) 2. Hyperlipidemia-he has been generally intolerant of statins-Crestor causes muscle pain, Lipitor causes memory loss, Zocor causes muscle pain. 2. Diabetes mellitus  History of Present Illness: [name] is an 74 year old gentleman with a history of coronary artery disease. He status post coronary artery bypass grafting. He is also status post PTCA and stenting. He also has a history of hyperlipidemia and diabetes mellitus.   He is exercising regularly . He does lots of yard work. He enjoys going to his beach house.  January 05, 2013:  [name] is doing well. His garden is doing well. Occasional CP and dyspnea. Pains last only for a few seconds. Does not have to take a NTG. Mows the lawn and does yard work without problems.   Dec. 16, 2014:  [name] is doing well. Doing yard work without problems. Occasional episodes  December 12, 2013: [name] is doing ok. Rare CP . Frequent head aches  Dec. 1 , 2015:  No cardiac complaints.  Aches all over. "staggers " when he walks or stands. No angina. He notices That he has occasional episodes of dyspnea.    December 13, 2014:  [name] is a 74 y.o. male who presents for his  CAD. Very rare CP.  Has some DOE climbing up his hill at his house.  Nov. 29, 2016:  [name] continues to have some chest pain .    Seems to start in his lower abdomen and moves up. Will last for several days.  NTG works at times and at other times, has no effect.  Exercising some.   Fatigues easily .  Has leg pain with walking .   Thinks the diabetes has a lot to do with his fatigue  . Glucose levels are ok  Goes to his beach house in [place]    Past Medical History  Diagnosis Date  . Diabetes mellitus   . Pancytopenia   . Hiatal hernia 2011    EGD  . Diverticulosis of colon (without mention of hemorrhage) 2011    Colonoscopy   . Hypertension   . ITP (idiopathic thrombocytopenic purpura)     He has been diagnosed with  . CAD (coronary artery disease)     Hx of     Past Surgical History  Procedure Laterality Date  . Hernia repair    . Coronary angioplasty with stent placement    . US echocardiography  02-08-2007    Est. EF 40-45%  . Coronary artery bypass graft      x4 with a left internal mammary artery anastomosis to the left anterior descending coronary artery   . Saphenous vein graft resection      graft to the first obtuse marginal, a saphenous vein graft to the first diagonal coronary artery  and a saphenous vein graft to the distal right coronary artery  . Cardiac catheterization      His last heart catheterization in May of 2009 reveals a patent LIMA  . Coronary stent placement      successful percutaneous transluminal coronary angioplasty and stenting of the left main coronary  artery  . Coronary angioplasty      Successful percutaneous transluminal coronary angioplasty of the left circumflex obuse marginal vessel     Current Outpatient Prescriptions  Medication Sig Dispense Refill  . ACCU-CHEK AVIVA PLUS test strip As directed    . clopidogrel (PLAVIX) 75 MG tablet TAKE 1 TABLET ONCE DAILY. 30 tablet 10  . isosorbide mononitrate (IMDUR) 30 MG 24 hr tablet TAKE 1 TABLET ONCE DAILY. 90 tablet 3  . Lancets (ACCU-CHEK MULTICLIX) lancets As directed    . lisinopril (PRINIVIL,ZESTRIL) 10 MG tablet     . metFORMIN (GLUCOPHAGE) 500 MG tablet Take 500 mg by mouth 3 (three) times daily.     . metoprolol tartrate (LOPRESSOR) 25 MG tablet Take 0.5 tablets (12.5 mg total) by mouth 2 (two) times daily. 90 tablet 3  . Multiple Vitamin  (MULTIVITAMIN) tablet Take 1 tablet by mouth daily.      . niacin 500 MG tablet Take 500 mg by mouth 3 (three) times daily with meals.      . nitroGLYCERIN (NITROSTAT) 0.4 MG SL tablet Place 0.4 mg under the tongue every 5 (five) minutes as needed.      Marland Kitchen VIGAMOX 0.5 % ophthalmic solution     . [DISCONTINUED] carvedilol (COREG) 3.125 MG tablet Take 3.125 mg by mouth 2 (two) times daily with a meal.       No current facility-administered medications for this visit.    Allergies:   Crestor; Lipitor; and Zocor    Social History:  The patient  reports that he has never smoked. He has never used smokeless tobacco. He reports that he does not drink alcohol or use illicit drugs.   Family History:  The patient's family history is negative for Colon cancer.    ROS:  Please see the history of present illness.    Review of Systems: Constitutional:  denies fever, chills, diaphoresis, appetite change and fatigue.  HEENT: denies photophobia, eye pain, redness, hearing loss, ear pain, congestion, sore throat, rhinorrhea, sneezing, neck pain, neck stiffness and tinnitus.  Respiratory: denies SOB, DOE, cough, chest tightness, and wheezing.  Cardiovascular: denies chest pain, palpitations and leg swelling.  Gastrointestinal: denies nausea, vomiting, abdominal pain, diarrhea, constipation, blood in stool.  Genitourinary: denies dysuria, urgency, frequency, hematuria, flank pain and difficulty urinating.  Musculoskeletal: denies  myalgias, back pain, joint swelling, arthralgias and gait problem.   Skin: denies pallor, rash and wound.  Neurological: denies dizziness, seizures, syncope, weakness, light-headedness, numbness and headaches.   Hematological: denies adenopathy, easy bruising, personal or family bleeding history.  Psychiatric/ Behavioral: denies suicidal ideation, mood changes, confusion, nervousness, sleep disturbance and agitation.       All other systems are reviewed and negative.     PHYSICAL EXAM: VS:  BP 110/60 mmHg  Pulse 52  Ht 5\' 10"  (1.778 m)  Wt 171 lb 6.4 oz (77.747 kg)  BMI 24.59 kg/m2  SpO2 98% , BMI Body mass index is 24.59 kg/(m^2). GEN: Well nourished, well developed, in no acute distress HEENT: normal Neck: no JVD, carotid bruits, or masses Cardiac: RRR; no murmurs, rubs, or gallops,no edema  Respiratory:  clear to auscultation bilaterally, normal work of breathing GI: soft, nontender, nondistended, + BS MS: no deformity or atrophy Skin: warm and dry, no rash Neuro:  Strength and sensation are intact Psych: normal   EKG:  EKG is ordered today. The ekg ordered today demonstrates ::   NSR at 60.  Occasional PVCs   Recent Labs: 01/15/2015: ALT  49; BUN 11.5; Creatinine 0.9; HGB 14.0; Platelets 92*; Potassium 3.9; Sodium 134*    Lipid Panel No results found for: CHOL, TRIG, HDL, CHOLHDL, VLDL, LDLCALC, LDLDIRECT    Wt Readings from Last 3 Encounters:  06/11/15 171 lb 6.4 oz (77.747 kg)  01/15/15 172 lb 1.6 oz (78.064 kg)  12/13/14 173 lb 3.2 oz (78.563 kg)      Other studies Reviewed: Additional studies/ records that were reviewed today include: . Review of the above records demonstrates:    ASSESSMENT AND PLAN:  1. Coronary artery disease-status post MI and eventually s/p CABG and also stenting - he is doing well , Has occasional episodes of chest pain - but these are atypical - seem to come from his abdomen, last for days.  He does have some shortness of breath. Suggested that he try a SL NTG Does not want to do a stress myoview at this time   2. Hyperlipidemia-he has been generally intolerant of statins-Crestor causes muscle pain, Lipitor causes memory loss, Zocor causes muscle pain.  2. Diabetes mellitus - managed by his medical doctor     Current medicines are reviewed at length with the patient today.  The patient does not have concerns regarding medicines.  The following changes have been made:  no change  Labs/  tests ordered today include:   No orders of the defined types were placed in this encounter.     Disposition:   FU with me in 6 months with fasting labs.       Ariadna Setter, Wonda Cheng, MD  06/11/2015 9:50 AM    [facility] [place], [place], [place]  [address] Phone: [phone]; Fax: [phone]   [facility]  [address] [place], [place]  [address] [phone]    Fax [phone]

## 2015-06-26 DIAGNOSIS — R69 Illness, unspecified: Secondary | ICD-10-CM | POA: Diagnosis not present

## 2015-07-16 ENCOUNTER — Other Ambulatory Visit (HOSPITAL_BASED_OUTPATIENT_CLINIC_OR_DEPARTMENT_OTHER): Payer: Medicare HMO

## 2015-07-16 ENCOUNTER — Ambulatory Visit (HOSPITAL_BASED_OUTPATIENT_CLINIC_OR_DEPARTMENT_OTHER): Payer: Medicare HMO | Admitting: Internal Medicine

## 2015-07-16 ENCOUNTER — Encounter: Payer: Self-pay | Admitting: Internal Medicine

## 2015-07-16 VITALS — BP 112/59 | HR 60 | Temp 98.6°F | Resp 18 | Ht 70.0 in | Wt 176.0 lb

## 2015-07-16 DIAGNOSIS — D61818 Other pancytopenia: Secondary | ICD-10-CM | POA: Diagnosis not present

## 2015-07-16 DIAGNOSIS — D693 Immune thrombocytopenic purpura: Secondary | ICD-10-CM | POA: Diagnosis not present

## 2015-07-16 LAB — COMPREHENSIVE METABOLIC PANEL
ALBUMIN: 3.1 g/dL — AB (ref 3.5–5.0)
ALT: 34 U/L (ref 0–55)
ANION GAP: 6 meq/L (ref 3–11)
AST: 35 U/L — ABNORMAL HIGH (ref 5–34)
Alkaline Phosphatase: 72 U/L (ref 40–150)
BILIRUBIN TOTAL: 1.35 mg/dL — AB (ref 0.20–1.20)
BUN: 12.3 mg/dL (ref 7.0–26.0)
CALCIUM: 8.8 mg/dL (ref 8.4–10.4)
CHLORIDE: 102 meq/L (ref 98–109)
CO2: 26 meq/L (ref 22–29)
Creatinine: 0.8 mg/dL (ref 0.7–1.3)
EGFR: 85 mL/min/{1.73_m2} — AB (ref >90)
Glucose: 195 mg/dl — ABNORMAL HIGH (ref 70–140)
Potassium: 4.5 mEq/L (ref 3.5–5.1)
SODIUM: 134 meq/L — AB (ref 136–145)
Total Protein: 6.6 g/dL (ref 6.4–8.3)

## 2015-07-16 LAB — CBC WITH DIFFERENTIAL/PLATELET
BASO%: 0.8 % (ref 0.0–2.0)
BASOS ABS: 0 10*3/uL (ref 0.0–0.1)
EOS ABS: 0.2 10*3/uL (ref 0.0–0.5)
EOS%: 4.2 % (ref 0.0–7.0)
HEMATOCRIT: 40.2 % (ref 38.4–49.9)
HGB: 13.9 g/dL (ref 13.0–17.1)
LYMPH#: 1.2 10*3/uL (ref 0.9–3.3)
LYMPH%: 25.4 % (ref 14.0–49.0)
MCH: 34.8 pg — AB (ref 27.2–33.4)
MCHC: 34.6 g/dL (ref 32.0–36.0)
MCV: 100.4 fL — AB (ref 79.3–98.0)
MONO#: 0.5 10*3/uL (ref 0.1–0.9)
MONO%: 9.6 % (ref 0.0–14.0)
NEUT#: 3 10*3/uL (ref 1.5–6.5)
NEUT%: 60 % (ref 39.0–75.0)
Platelets: 103 10*3/uL — ABNORMAL LOW (ref 140–400)
RBC: 4 10*6/uL — ABNORMAL LOW (ref 4.20–5.82)
RDW: 12.9 % (ref 11.0–14.6)
WBC: 4.9 10*3/uL (ref 4.0–10.3)

## 2015-07-16 LAB — LACTATE DEHYDROGENASE: LDH: 236 U/L (ref 125–245)

## 2015-07-16 NOTE — Progress Notes (Signed)
[facility] Telephone:([phone]   Fax:(336) [place], [place] [address] [place] [address]  DIAGNOSIS: Idiopathic thrombocytopenic purpura   PRIOR THERAPY: None   CURRENT THERAPY: Observation.  INTERVAL HISTORY: [name] 75 y.o. male returns to the clinic today for six-month followup visit accompanied by his wife. The patient continues to do fine with no specific complaints. He denied having any recent significant bleeding issues. The patient denied having any weight loss or night sweats. He denied having any chest pain, shortness of breath, cough or hemoptysis. He has repeat CBC performed earlier today and he is here for evaluation and discussion of his lab results.  MEDICAL HISTORY: Past Medical History  Diagnosis Date  . Diabetes mellitus   . Pancytopenia   . Hiatal hernia 2011    EGD  . Diverticulosis of colon (without mention of hemorrhage) 2011    Colonoscopy   . Hypertension   . ITP (idiopathic thrombocytopenic purpura)     He has been diagnosed with  . CAD (coronary artery disease)     Hx of     ALLERGIES:  is allergic to crestor; lipitor; and zocor.  MEDICATIONS:  Current Outpatient Prescriptions  Medication Sig Dispense Refill  . ACCU-CHEK AVIVA PLUS test strip As directed    . clopidogrel (PLAVIX) 75 MG tablet TAKE 1 TABLET ONCE DAILY. 30 tablet 10  . isosorbide mononitrate (IMDUR) 30 MG 24 hr tablet TAKE 1 TABLET ONCE DAILY. 90 tablet 3  . Lancets (ACCU-CHEK MULTICLIX) lancets As directed    . lisinopril (PRINIVIL,ZESTRIL) 10 MG tablet     . metFORMIN (GLUCOPHAGE) 500 MG tablet Take 500 mg by mouth 3 (three) times daily.     . metoprolol tartrate (LOPRESSOR) 25 MG tablet Take 0.5 tablets (12.5 mg total) by mouth 2 (two) times daily. 90 tablet 3  . Multiple Vitamin (MULTIVITAMIN) tablet Take 1 tablet by mouth daily.      . niacin 500 MG tablet Take 500 mg by mouth 3 (three) times  daily with meals.      . nitroGLYCERIN (NITROSTAT) 0.4 MG SL tablet Place 0.4 mg under the tongue every 5 (five) minutes as needed.      Marland Kitchen VIGAMOX 0.5 % ophthalmic solution     . [DISCONTINUED] carvedilol (COREG) 3.125 MG tablet Take 3.125 mg by mouth 2 (two) times daily with a meal.       No current facility-administered medications for this visit.    SURGICAL HISTORY:  Past Surgical History  Procedure Laterality Date  . Hernia repair    . Coronary angioplasty with stent placement    . US echocardiography  02-08-2007    Est. EF 40-45%  . Coronary artery bypass graft      x4 with a left internal mammary artery anastomosis to the left anterior descending coronary artery   . Saphenous vein graft resection      graft to the first obtuse marginal, a saphenous vein graft to the first diagonal coronary artery  and a saphenous vein graft to the distal right coronary artery  . Cardiac catheterization      His last heart catheterization in May of 2009 reveals a patent LIMA  . Coronary stent placement      successful percutaneous transluminal coronary angioplasty and stenting of the left main coronary artery  . Coronary angioplasty      Successful percutaneous transluminal coronary angioplasty of the left  circumflex obuse marginal vessel    REVIEW OF SYSTEMS:  A comprehensive review of systems was negative.   PHYSICAL EXAMINATION: General appearance: alert, cooperative and no distress Head: Normocephalic, without obvious abnormality, atraumatic Neck: no adenopathy Lymph nodes: Cervical, supraclavicular, and axillary nodes normal. Resp: clear to auscultation bilaterally Cardio: regular rate and rhythm, S1, S2 normal, no murmur, click, rub or gallop GI: soft, non-tender; bowel sounds normal; no masses,  no organomegaly Extremities: extremities normal, atraumatic, no cyanosis or edema  ECOG PERFORMANCE STATUS: 0 - Asymptomatic  Blood pressure 112/59, pulse 60, temperature 98.6 F (37 C),  temperature source Oral, resp. rate 18, height 5\' 10"  (1.778 m), weight 176 lb (79.833 kg), SpO2 100 %.  LABORATORY DATA: Lab Results  Component Value Date   WBC 4.9 07/16/2015   HGB 13.9 07/16/2015   HCT 40.2 07/16/2015   MCV 100.4* 07/16/2015   PLT 103* 07/16/2015      Chemistry      Component Value Date/Time   NA 135 06/11/2015 0913   NA 134* 01/15/2015 1439   K 4.2 06/11/2015 0913   K 3.9 01/15/2015 1439   CL 104 06/11/2015 0913   CL 106 07/19/2012 1028   CO2 27 06/11/2015 0913   CO2 24 01/15/2015 1439   BUN 10 06/11/2015 0913   BUN 11.5 01/15/2015 1439   CREATININE 0.75 06/11/2015 0913   CREATININE 0.9 01/15/2015 1439   CREATININE 0.83 03/22/2012 0625      Component Value Date/Time   CALCIUM 8.6 06/11/2015 0913   CALCIUM 8.6 01/15/2015 1439   ALKPHOS 60 06/11/2015 0913   ALKPHOS 100 01/15/2015 1439   AST 30 06/11/2015 0913   AST 45* 01/15/2015 1439   ALT 27 06/11/2015 0913   ALT 49 01/15/2015 1439   BILITOT 1.6* 06/11/2015 0913   BILITOT 1.15 01/15/2015 1439       RADIOGRAPHIC STUDIES: No results found.  ASSESSMENT AND PLAN: This is a very pleasant 75 years old white male with history of idiopathic thrombocytopenic purpura currently on observation. His platelets count today are 103,000. The patient denied having any significant bleeding issues. I recommended for him to continue on observation for now.  I would see him back for followup visit in one year with repeat CBC, comprehensive metabolic panel and LDH but he was advised to call immediately if he has any bleeding issues in the interval. He was advised to call immediately if he has any concerning symptoms in the interval.  All questions were answered. The patient knows to call the clinic with any problems, questions or concerns. We can certainly see the patient much sooner if necessary.  Disclaimer: This note was dictated with voice recognition software. Similar sounding words can inadvertently be  transcribed and may not be corrected upon review.

## 2015-07-21 ENCOUNTER — Telehealth: Payer: Self-pay | Admitting: Internal Medicine

## 2015-07-21 NOTE — Telephone Encounter (Signed)
Spoke with patient and confirmed 07/14/16 lab/fu. Patient was aware.

## 2015-07-25 DIAGNOSIS — R69 Illness, unspecified: Secondary | ICD-10-CM | POA: Diagnosis not present

## 2015-08-02 DIAGNOSIS — I10 Essential (primary) hypertension: Secondary | ICD-10-CM | POA: Diagnosis not present

## 2015-08-02 DIAGNOSIS — E1140 Type 2 diabetes mellitus with diabetic neuropathy, unspecified: Secondary | ICD-10-CM | POA: Diagnosis not present

## 2015-08-02 DIAGNOSIS — I209 Angina pectoris, unspecified: Secondary | ICD-10-CM | POA: Diagnosis not present

## 2015-08-07 DIAGNOSIS — R69 Illness, unspecified: Secondary | ICD-10-CM | POA: Diagnosis not present

## 2015-08-08 DIAGNOSIS — D693 Immune thrombocytopenic purpura: Secondary | ICD-10-CM | POA: Diagnosis not present

## 2015-08-08 DIAGNOSIS — E1142 Type 2 diabetes mellitus with diabetic polyneuropathy: Secondary | ICD-10-CM | POA: Diagnosis not present

## 2015-08-08 DIAGNOSIS — E1140 Type 2 diabetes mellitus with diabetic neuropathy, unspecified: Secondary | ICD-10-CM | POA: Diagnosis not present

## 2015-08-08 DIAGNOSIS — E785 Hyperlipidemia, unspecified: Secondary | ICD-10-CM | POA: Diagnosis not present

## 2015-08-08 DIAGNOSIS — Z6825 Body mass index (BMI) 25.0-25.9, adult: Secondary | ICD-10-CM | POA: Diagnosis not present

## 2015-08-08 DIAGNOSIS — N401 Enlarged prostate with lower urinary tract symptoms: Secondary | ICD-10-CM | POA: Diagnosis not present

## 2015-08-08 DIAGNOSIS — I10 Essential (primary) hypertension: Secondary | ICD-10-CM | POA: Diagnosis not present

## 2015-08-08 DIAGNOSIS — B070 Plantar wart: Secondary | ICD-10-CM | POA: Diagnosis not present

## 2015-08-08 DIAGNOSIS — I2510 Atherosclerotic heart disease of native coronary artery without angina pectoris: Secondary | ICD-10-CM | POA: Diagnosis not present

## 2015-09-18 DIAGNOSIS — R69 Illness, unspecified: Secondary | ICD-10-CM | POA: Diagnosis not present

## 2015-10-31 DIAGNOSIS — R69 Illness, unspecified: Secondary | ICD-10-CM | POA: Diagnosis not present

## 2015-11-12 ENCOUNTER — Encounter: Payer: Self-pay | Admitting: Cardiovascular Disease

## 2015-11-12 ENCOUNTER — Ambulatory Visit (INDEPENDENT_AMBULATORY_CARE_PROVIDER_SITE_OTHER): Payer: Medicare HMO | Admitting: Cardiovascular Disease

## 2015-11-12 VITALS — BP 110/58 | HR 58 | Ht 70.0 in | Wt 176.8 lb

## 2015-11-12 DIAGNOSIS — I2510 Atherosclerotic heart disease of native coronary artery without angina pectoris: Secondary | ICD-10-CM | POA: Diagnosis not present

## 2015-11-12 DIAGNOSIS — H43811 Vitreous degeneration, right eye: Secondary | ICD-10-CM | POA: Diagnosis not present

## 2015-11-12 DIAGNOSIS — E785 Hyperlipidemia, unspecified: Secondary | ICD-10-CM | POA: Diagnosis not present

## 2015-11-12 DIAGNOSIS — Z961 Presence of intraocular lens: Secondary | ICD-10-CM | POA: Diagnosis not present

## 2015-11-12 DIAGNOSIS — S0011XA Contusion of right eyelid and periocular area, initial encounter: Secondary | ICD-10-CM | POA: Diagnosis not present

## 2015-11-12 MED ORDER — CLOPIDOGREL BISULFATE 75 MG PO TABS: 75.0000 mg | ORAL_TABLET | Freq: Every day | ORAL | Status: DC

## 2015-11-12 NOTE — Patient Instructions (Signed)
Medication Instructions:  STOP Metoprolol   Labwork: None Ordered   Testing/Procedures: None Ordered   Follow-Up: Your physician wants you to follow-up in: 6 months with Dr. Acie Fredrickson.  You will receive a reminder letter in the mail two months in advance. If you don't receive a letter, please call our office to schedule the follow-up appointment.   If you need a refill on your cardiac medications before your next appointment, please call your pharmacy.   Thank you for choosing [facility]! Christen Bame, RN [phone]

## 2015-11-12 NOTE — Progress Notes (Signed)
Cardiology Office Note   Date:  11/12/2015   ID:  [name], DOB 1940/08/20, MRN [id number]  PCP:  Lillard Anes, MD  Cardiologist:   Thayer Headings, MD   Chief Complaint  Patient presents with  . Follow-up  . Coronary Artery Disease   1. Coronary artery disease-status post MI and eventually s/p CABG  ( 2003) and also stenting ( 2007) 2. Hyperlipidemia-he has been generally intolerant of statins-Crestor causes muscle pain, Lipitor causes memory loss, Zocor causes muscle pain. 2. Diabetes mellitus  History of Present Illness: [name] is an 75 year old gentleman with a history of coronary artery disease. He status post coronary artery bypass grafting. He is also status post PTCA and stenting. He also has a history of hyperlipidemia and diabetes mellitus.   He is exercising regularly . He does lots of yard work. He enjoys going to his beach house.  January 05, 2013:  [name] is doing well. His garden is doing well. Occasional CP and dyspnea. Pains last only for a few seconds. Does not have to take a NTG. Mows the lawn and does yard work without problems.   Dec. 16, 2014:  [name] is doing well. Doing yard work without problems. Occasional episodes  December 12, 2013: [name] is doing ok. Rare CP . Frequent head aches  Dec. 1 , 2015:  No cardiac complaints.  Aches all over. "staggers " when he walks or stands. No angina. He notices That he has occasional episodes of dyspnea.    December 13, 2014:  [name] is a 75 y.o. male who presents for his  CAD. Very rare CP.  Has some DOE climbing up his hill at his house.  Nov. 29, 2016:  [name] continues to have some chest pain .    Seems to start in his lower abdomen and moves up. Will last for several days.  NTG works at times and at other times, has no effect.  Exercising some.   Fatigues easily .  Has leg pain with walking .   Thinks the diabetes has a lot to do with  his fatigue . Glucose levels are ok  Goes to his beach house in [place]   Nov 12, 2015:  Doing well from a cardiac standpoint. Got dizzy - does not know if he had a syncopal episode. Had not been out working much. He thinks he may have tripped on the step    Past Medical History  Diagnosis Date  . Diabetes mellitus   . Pancytopenia   . Hiatal hernia 2011    EGD  . Diverticulosis of colon (without mention of hemorrhage) 2011    Colonoscopy   . Hypertension   . ITP (idiopathic thrombocytopenic purpura)     He has been diagnosed with  . CAD (coronary artery disease)     Hx of     Past Surgical History  Procedure Laterality Date  . Hernia repair    . Coronary angioplasty with stent placement    . US echocardiography  02-08-2007    Est. EF 40-45%  . Coronary artery bypass graft      x4 with a left internal mammary artery anastomosis to the left anterior descending coronary artery   . Saphenous vein graft resection      graft to the first obtuse marginal, a saphenous vein graft to the first diagonal coronary artery  and a saphenous vein graft to the distal right coronary artery  .  Cardiac catheterization      His last heart catheterization in May of 2009 reveals a patent LIMA  . Coronary stent placement      successful percutaneous transluminal coronary angioplasty and stenting of the left main coronary artery  . Coronary angioplasty      Successful percutaneous transluminal coronary angioplasty of the left circumflex obuse marginal vessel     Current Outpatient Prescriptions  Medication Sig Dispense Refill  . ACCU-CHEK AVIVA PLUS test strip As directed    . clopidogrel (PLAVIX) 75 MG tablet TAKE 1 TABLET ONCE DAILY. 30 tablet 10  . isosorbide mononitrate (IMDUR) 30 MG 24 hr tablet TAKE 1 TABLET ONCE DAILY. 90 tablet 3  . Lancets (ACCU-CHEK MULTICLIX) lancets As directed    . lisinopril (PRINIVIL,ZESTRIL) 10 MG tablet Take 10 mg by mouth daily.     . metFORMIN  (GLUCOPHAGE) 500 MG tablet Take 500 mg by mouth 3 (three) times daily.     . metoprolol tartrate (LOPRESSOR) 25 MG tablet Take 0.5 tablets (12.5 mg total) by mouth 2 (two) times daily. 90 tablet 3  . Multiple Vitamin (MULTIVITAMIN) tablet Take 1 tablet by mouth daily.      . niacin 500 MG tablet Take 500 mg by mouth 3 (three) times daily with meals.      . nitroGLYCERIN (NITROSTAT) 0.4 MG SL tablet Place 0.4 mg under the tongue every 5 (five) minutes as needed.      . [DISCONTINUED] carvedilol (COREG) 3.125 MG tablet Take 3.125 mg by mouth 2 (two) times daily with a meal.       No current facility-administered medications for this visit.    Allergies:   Crestor; Lipitor; and Zocor    Social History:  The patient  reports that he has never smoked. He has never used smokeless tobacco. He reports that he does not drink alcohol or use illicit drugs.   Family History:  The patient's family history is negative for Colon cancer.    ROS:  Please see the history of present illness.    Review of Systems: Constitutional:  denies fever, chills, diaphoresis, appetite change and fatigue.  HEENT: denies photophobia, eye pain, redness, hearing loss, ear pain, congestion, sore throat, rhinorrhea, sneezing, neck pain, neck stiffness and tinnitus.  Respiratory: denies SOB, DOE, cough, chest tightness, and wheezing.  Cardiovascular: denies chest pain, palpitations and leg swelling.  Gastrointestinal: denies nausea, vomiting, abdominal pain, diarrhea, constipation, blood in stool.  Genitourinary: denies dysuria, urgency, frequency, hematuria, flank pain and difficulty urinating.  Musculoskeletal: denies  myalgias, back pain, joint swelling, arthralgias and gait problem.   Skin: denies pallor, rash and wound.  Neurological: denies dizziness, seizures, syncope, weakness, light-headedness, numbness and headaches.   Hematological: denies adenopathy, easy bruising, personal or family bleeding history.    Psychiatric/ Behavioral: denies suicidal ideation, mood changes, confusion, nervousness, sleep disturbance and agitation.       All other systems are reviewed and negative.    PHYSICAL EXAM: VS:  BP 110/58 mmHg  Pulse 58  Ht 5\' 10"  (1.778 m)  Wt 176 lb 12.8 oz (80.196 kg)  BMI 25.37 kg/m2  SpO2 97% , BMI Body mass index is 25.37 kg/(m^2). GEN: Well nourished, well developed, in no acute distress HEENT: normal Neck: no JVD, carotid bruits, or masses Cardiac: RRR; no murmurs, rubs, or gallops,no edema  Respiratory:  clear to auscultation bilaterally, normal work of breathing GI: soft, nontender, nondistended, + BS MS: no deformity or atrophy Skin: warm and dry, no  rash Neuro:  Strength and sensation are intact Psych: normal   EKG:  EKG is ordered today. The ekg ordered today demonstrates ::   Sinus brady at 58.   Otherwise normal ECG   Recent Labs: 07/16/2015: ALT 34; BUN 12.3; Creatinine 0.8; HGB 13.9; Platelets 103*; Potassium 4.5; Sodium 134*    Lipid Panel    Component Value Date/Time   CHOL 129 06/11/2015 0913   TRIG 65 06/11/2015 0913   HDL 59 06/11/2015 0913   CHOLHDL 2.2 06/11/2015 0913   VLDL 13 06/11/2015 0913   LDLCALC 57 06/11/2015 0913      Wt Readings from Last 3 Encounters:  11/12/15 176 lb 12.8 oz (80.196 kg)  07/16/15 176 lb (79.833 kg)  06/11/15 171 lb 6.4 oz (77.747 kg)      Other studies Reviewed: Additional studies/ records that were reviewed today include: . Review of the above records demonstrates:    ASSESSMENT AND PLAN:  1. Coronary artery disease-status post MI and eventually s/p CABG and also stenting - he is doing well , Has occasional episodes of chest pain - but these are atypical - seem to come from his abdomen, last for days.  He does have some shortness of breath. Suggested that he try a SL NTG Does not want to do a stress myoview at this time   2. Hyperlipidemia-he has been generally intolerant of statins-Crestor  causes muscle pain, Lipitor causes memory loss, Zocor causes muscle pain.  3. Diabetes mellitus - managed by his medical doctor   4. Bradycardia:   HR is a little slow.   He tripped recently and fell - does not know if he had syncope but thinks that he tripped.  Will DC metoprolol to allow his HR to increase slightly   Current medicines are reviewed at length with the patient today.  The patient does not have concerns regarding medicines.  The following changes have been made:  no change  Labs/ tests ordered today include:   No orders of the defined types were placed in this encounter.     Disposition:   FU with me in 6 months with fasting labs.       [name], Wonda Cheng, MD  11/12/2015 3:32 PM    [facility] [place], [place], [place]  [address] Phone: [phone]; Fax: [phone]   [facility]  [address] [place], [place]  [address] [phone]    Fax [phone]

## 2015-11-13 ENCOUNTER — Other Ambulatory Visit: Payer: Self-pay | Admitting: Cardiovascular Disease

## 2015-12-05 DIAGNOSIS — I10 Essential (primary) hypertension: Secondary | ICD-10-CM | POA: Diagnosis not present

## 2015-12-05 DIAGNOSIS — R634 Abnormal weight loss: Secondary | ICD-10-CM | POA: Diagnosis not present

## 2015-12-05 DIAGNOSIS — N183 Chronic kidney disease, stage 3 (moderate): Secondary | ICD-10-CM | POA: Diagnosis not present

## 2015-12-05 DIAGNOSIS — J449 Chronic obstructive pulmonary disease, unspecified: Secondary | ICD-10-CM | POA: Diagnosis not present

## 2015-12-05 DIAGNOSIS — I2510 Atherosclerotic heart disease of native coronary artery without angina pectoris: Secondary | ICD-10-CM | POA: Diagnosis not present

## 2015-12-05 DIAGNOSIS — E785 Hyperlipidemia, unspecified: Secondary | ICD-10-CM | POA: Diagnosis not present

## 2015-12-11 DIAGNOSIS — R69 Illness, unspecified: Secondary | ICD-10-CM | POA: Diagnosis not present

## 2015-12-17 DIAGNOSIS — N401 Enlarged prostate with lower urinary tract symptoms: Secondary | ICD-10-CM | POA: Diagnosis not present

## 2015-12-17 DIAGNOSIS — I2510 Atherosclerotic heart disease of native coronary artery without angina pectoris: Secondary | ICD-10-CM | POA: Diagnosis not present

## 2015-12-17 DIAGNOSIS — E1140 Type 2 diabetes mellitus with diabetic neuropathy, unspecified: Secondary | ICD-10-CM | POA: Diagnosis not present

## 2015-12-17 DIAGNOSIS — D691 Qualitative platelet defects: Secondary | ICD-10-CM | POA: Diagnosis not present

## 2015-12-17 DIAGNOSIS — I10 Essential (primary) hypertension: Secondary | ICD-10-CM | POA: Diagnosis not present

## 2015-12-17 DIAGNOSIS — Z6824 Body mass index (BMI) 24.0-24.9, adult: Secondary | ICD-10-CM | POA: Diagnosis not present

## 2015-12-17 DIAGNOSIS — E1142 Type 2 diabetes mellitus with diabetic polyneuropathy: Secondary | ICD-10-CM | POA: Diagnosis not present

## 2015-12-17 DIAGNOSIS — E785 Hyperlipidemia, unspecified: Secondary | ICD-10-CM | POA: Diagnosis not present

## 2015-12-23 DIAGNOSIS — R69 Illness, unspecified: Secondary | ICD-10-CM | POA: Diagnosis not present

## 2016-02-05 DIAGNOSIS — R69 Illness, unspecified: Secondary | ICD-10-CM | POA: Diagnosis not present

## 2016-04-03 DIAGNOSIS — Z23 Encounter for immunization: Secondary | ICD-10-CM | POA: Diagnosis not present

## 2016-04-27 DIAGNOSIS — H52203 Unspecified astigmatism, bilateral: Secondary | ICD-10-CM | POA: Diagnosis not present

## 2016-04-27 DIAGNOSIS — H26492 Other secondary cataract, left eye: Secondary | ICD-10-CM | POA: Diagnosis not present

## 2016-04-27 DIAGNOSIS — E119 Type 2 diabetes mellitus without complications: Secondary | ICD-10-CM | POA: Diagnosis not present

## 2016-04-27 DIAGNOSIS — H01001 Unspecified blepharitis right upper eyelid: Secondary | ICD-10-CM | POA: Diagnosis not present

## 2016-05-05 DIAGNOSIS — J209 Acute bronchitis, unspecified: Secondary | ICD-10-CM | POA: Diagnosis not present

## 2016-05-06 DIAGNOSIS — R69 Illness, unspecified: Secondary | ICD-10-CM | POA: Diagnosis not present

## 2016-05-11 ENCOUNTER — Ambulatory Visit (INDEPENDENT_AMBULATORY_CARE_PROVIDER_SITE_OTHER): Payer: Medicare HMO | Admitting: Cardiovascular Disease

## 2016-05-11 ENCOUNTER — Encounter: Payer: Self-pay | Admitting: Cardiovascular Disease

## 2016-05-11 VITALS — BP 104/56 | HR 76 | Ht 70.0 in | Wt 172.6 lb

## 2016-05-11 DIAGNOSIS — I2510 Atherosclerotic heart disease of native coronary artery without angina pectoris: Secondary | ICD-10-CM | POA: Diagnosis not present

## 2016-05-11 DIAGNOSIS — I10 Essential (primary) hypertension: Secondary | ICD-10-CM | POA: Diagnosis not present

## 2016-05-11 LAB — COMPREHENSIVE METABOLIC PANEL
ALBUMIN: 3.3 g/dL — AB (ref 3.6–5.1)
ALK PHOS: 62 U/L (ref 40–115)
ALT: 29 U/L (ref 9–46)
AST: 34 U/L (ref 10–35)
BUN: 13 mg/dL (ref 7–25)
CHLORIDE: 103 mmol/L (ref 98–110)
CO2: 26 mmol/L (ref 20–31)
Calcium: 8.8 mg/dL (ref 8.6–10.3)
Creat: 0.75 mg/dL (ref 0.70–1.18)
Glucose, Bld: 194 mg/dL — ABNORMAL HIGH (ref 65–99)
POTASSIUM: 4.2 mmol/L (ref 3.5–5.3)
Sodium: 136 mmol/L (ref 135–146)
TOTAL PROTEIN: 6.4 g/dL (ref 6.1–8.1)
Total Bilirubin: 1.3 mg/dL — ABNORMAL HIGH (ref 0.2–1.2)

## 2016-05-11 LAB — LIPID PANEL
CHOLESTEROL: 162 mg/dL (ref 125–200)
HDL: 61 mg/dL (ref >=40)
LDL Cholesterol: 83 mg/dL (ref <130)
TRIGLYCERIDES: 91 mg/dL (ref <150)
Total CHOL/HDL Ratio: 2.7 Ratio (ref <=5.0)
VLDL: 18 mg/dL (ref <30)

## 2016-05-11 NOTE — Progress Notes (Signed)
Cardiology Office Note   Date:  05/11/2016   ID:  [name], DOB 09-29-1940, MRN [id number]  PCP:  Lillard Anes, MD  Cardiologist:   Mertie Moores, MD   Chief Complaint  Patient presents with  . Coronary Artery Disease   1. Coronary artery disease-status post MI and eventually s/p CABG  ( 2003) and also stenting ( 2007) 2. Hyperlipidemia-he has been generally intolerant of statins-Crestor causes muscle pain, Lipitor causes memory loss, Zocor causes muscle pain. 2. Diabetes mellitus  [name] is an 75 year old gentleman with a history of coronary artery disease. He status post coronary artery bypass grafting. He is also status post PTCA and stenting. He also has a history of hyperlipidemia and diabetes mellitus.   He is exercising regularly . He does lots of yard work. He enjoys going to his beach house.  January 05, 2013:  [name] is doing well. His garden is doing well. Occasional CP and dyspnea. Pains last only for a few seconds. Does not have to take a NTG. Mows the lawn and does yard work without problems.   Dec. 16, 2014:  [name] is doing well. Doing yard work without problems. Occasional episodes  December 12, 2013: [name] is doing ok. Rare CP . Frequent head aches  Dec. 1 , 2015:  No cardiac complaints.  Aches all over. "staggers " when he walks or stands. No angina. He notices That he has occasional episodes of dyspnea.    December 13, 2014:  [name] is a 75 y.o. male who presents for his  CAD. Very rare CP.  Has some DOE climbing up his hill at his house.  Nov. 29, 2016:  [name] continues to have some chest pain .    Seems to start in his lower abdomen and moves up. Will last for several days.  NTG works at times and at other times, has no effect.  Exercising some.   Fatigues easily .  Has leg pain with walking .   Thinks the diabetes has a lot to do with his fatigue . Glucose levels are ok  Goes to  his beach house in [place]   Nov 12, 2015:  Doing well from a cardiac standpoint. Got dizzy - does not know if he had a syncopal episode. Had not been out working much. He thinks he may have tripped on the step   Oct. 30 , 2017:  Doing well. No CP or dyspnea  Has had some sinus issues,  [facility] to [facility] ,   Hx of CAD - intolerant to statins  - lots of muscle aches   Past Medical History:  Diagnosis Date  . CAD (coronary artery disease)    Hx of   . Diabetes mellitus   . Diverticulosis of colon (without mention of hemorrhage) 2011   Colonoscopy   . Hiatal hernia 2011   EGD  . Hypertension   . ITP (idiopathic thrombocytopenic purpura)    He has been diagnosed with  . Pancytopenia     Past Surgical History:  Procedure Laterality Date  . CARDIAC CATHETERIZATION     His last heart catheterization in May of 2009 reveals a patent LIMA  . CORONARY ANGIOPLASTY     Successful percutaneous transluminal coronary angioplasty of the left circumflex obuse marginal vessel  . CORONARY ANGIOPLASTY WITH STENT PLACEMENT    . CORONARY ARTERY BYPASS GRAFT     x4 with a left internal mammary artery anastomosis  to the left anterior descending coronary artery   . CORONARY STENT PLACEMENT     successful percutaneous transluminal coronary angioplasty and stenting of the left main coronary artery  . HERNIA REPAIR    . SAPHENOUS VEIN GRAFT RESECTION     graft to the first obtuse marginal, a saphenous vein graft to the first diagonal coronary artery  and a saphenous vein graft to the distal right coronary artery  . US ECHOCARDIOGRAPHY  02-08-2007   Est. EF 40-45%     Current Outpatient Prescriptions  Medication Sig Dispense Refill  . ACCU-CHEK AVIVA PLUS test strip As directed    . clopidogrel (PLAVIX) 75 MG tablet Take 1 tablet (75 mg total) by mouth daily. 90 tablet 3  . FLUZONE HIGH-DOSE 0.5 ML SUSY Inject as directed once.  0  . isosorbide mononitrate (IMDUR) 30 MG 24 hr tablet  Take 1 tablet (30 mg total) by mouth daily. 90 tablet 3  . Lancets (ACCU-CHEK MULTICLIX) lancets As directed    . lisinopril (PRINIVIL,ZESTRIL) 10 MG tablet Take 10 mg by mouth daily.     . metFORMIN (GLUCOPHAGE) 500 MG tablet Take 500 mg by mouth 3 (three) times daily.     . Multiple Vitamin (MULTIVITAMIN) tablet Take 1 tablet by mouth daily.      . niacin 500 MG tablet Take 500 mg by mouth 3 (three) times daily with meals.      . nitroGLYCERIN (NITROSTAT) 0.4 MG SL tablet Place 0.4 mg under the tongue every 5 (five) minutes as needed.       No current facility-administered medications for this visit.     Allergies:   Crestor [rosuvastatin calcium]; Lipitor [atorvastatin calcium]; and Zocor [simvastatin]    Social History:  The patient  reports that he has never smoked. He has never used smokeless tobacco. He reports that he does not drink alcohol or use drugs.   Family History:  The patient's family history is not on file.   ROS:  Please see the history of present illness.   Review of Systems: Constitutional:  denies fever, chills, diaphoresis, appetite change and fatigue.  HEENT: denies photophobia, eye pain, redness, hearing loss, ear pain, congestion, sore throat, rhinorrhea, sneezing, neck pain, neck stiffness and tinnitus.  Respiratory: denies SOB, DOE, cough, chest tightness, and wheezing.  Cardiovascular: denies chest pain, palpitations and leg swelling.  Gastrointestinal: denies nausea, vomiting, abdominal pain, diarrhea, constipation, blood in stool.  Genitourinary: denies dysuria, urgency, frequency, hematuria, flank pain and difficulty urinating.  Musculoskeletal: denies  myalgias, back pain, joint swelling, arthralgias and gait problem.   Skin: denies pallor, rash and wound.  Neurological: denies dizziness, seizures, syncope, weakness, light-headedness, numbness and headaches.   Hematological: denies adenopathy, easy bruising, personal or family bleeding history.    Psychiatric/ Behavioral: denies suicidal ideation, mood changes, confusion, nervousness, sleep disturbance and agitation.      All other systems are reviewed and negative.   PHYSICAL EXAM: VS:  BP (!) 104/56 (BP Location: Right Arm, Patient Position: Sitting, Cuff Size: Normal)   Pulse 76   Ht 5\' 10"  (1.778 m)   Wt 172 lb 9.6 oz (78.3 kg)   BMI 24.77 kg/m  , BMI Body mass index is 24.77 kg/m. GEN: Well nourished, well developed, in no acute distress  HEENT: normal  Neck: no JVD, carotid bruits, or masses Cardiac: RRR; no murmurs, rubs, or gallops,no edema  Respiratory:  clear to auscultation bilaterally, normal work of breathing GI: soft, nontender, nondistended, + BS  MS: no deformity or atrophy  Skin: warm and dry, no rash Neuro:  Strength and sensation are intact Psych: normal  EKG:  EKG is ordered today. The ekg ordered today demonstrates ::   Sinus brady at 58.   Otherwise normal ECG  Recent Labs: 07/16/2015: ALT 34; BUN 12.3; Creatinine 0.8; HGB 13.9; Platelets 103; Potassium 4.5; Sodium 134   Lipid Panel    Component Value Date/Time   CHOL 129 06/11/2015 0913   TRIG 65 06/11/2015 0913   HDL 59 06/11/2015 0913   CHOLHDL 2.2 06/11/2015 0913   VLDL 13 06/11/2015 0913   LDLCALC 57 06/11/2015 0913      Wt Readings from Last 3 Encounters:  05/11/16 172 lb 9.6 oz (78.3 kg)  11/12/15 176 lb 12.8 oz (80.2 kg)  07/16/15 176 lb (79.8 kg)      Other studies Reviewed: Additional studies/ records that were reviewed today include: . Review of the above records demonstrates:    ASSESSMENT AND PLAN:  1. Coronary artery disease-status post MI and eventually s/p CABG and also stenting -  No angina .  contniue meds   2. Hyperlipidemia-he has been generally intolerant of statins-Crestor causes muscle pain, Lipitor causes memory loss, Zocor causes muscle pain. Will check labs today   3. Diabetes mellitus - managed by his medical doctor   4. Bradycardia:    Improved  after we DC'd the metoprolol   Current medicines are reviewed at length with the patient today.  The patient does not have concerns regarding medicines.  The following changes have been made:  no change  Labs/ tests ordered today include:   No orders of the defined types were placed in this encounter.    Disposition:   FU with me in 6 months with fasting labs.       Mertie Moores, MD  05/11/2016 9:48 AM    [facility] [place], [place], [place]  [address] Phone: [phone]; Fax: [phone]   [facility]  [address] [place], [place]  [address] [phone]    Fax [phone]

## 2016-05-11 NOTE — Patient Instructions (Signed)
Medication Instructions:  Your physician recommends that you continue on your current medications as directed. Please refer to the Current Medication list given to you today.   Labwork: TODAY - cholesterol, complete metabolic   Testing/Procedures: None Ordered    Follow-Up: Your physician wants you to follow-up in: 6 months with Dr. Acie Fredrickson.  You will receive a reminder letter in the mail two months in advance. If you don't receive a letter, please call our office to schedule the follow-up appointment.   If you need a refill on your cardiac medications before your next appointment, please call your pharmacy.   Thank you for choosing [facility]! Christen Bame, RN [phone]

## 2016-05-18 DIAGNOSIS — R69 Illness, unspecified: Secondary | ICD-10-CM | POA: Diagnosis not present

## 2016-06-11 DIAGNOSIS — H26492 Other secondary cataract, left eye: Secondary | ICD-10-CM | POA: Diagnosis not present

## 2016-06-18 DIAGNOSIS — E782 Mixed hyperlipidemia: Secondary | ICD-10-CM | POA: Diagnosis not present

## 2016-06-18 DIAGNOSIS — I10 Essential (primary) hypertension: Secondary | ICD-10-CM | POA: Diagnosis not present

## 2016-06-18 DIAGNOSIS — E1149 Type 2 diabetes mellitus with other diabetic neurological complication: Secondary | ICD-10-CM | POA: Diagnosis not present

## 2016-06-18 DIAGNOSIS — E1142 Type 2 diabetes mellitus with diabetic polyneuropathy: Secondary | ICD-10-CM | POA: Diagnosis not present

## 2016-07-14 ENCOUNTER — Encounter: Payer: Self-pay | Admitting: Internal Medicine

## 2016-07-14 ENCOUNTER — Other Ambulatory Visit (HOSPITAL_BASED_OUTPATIENT_CLINIC_OR_DEPARTMENT_OTHER): Payer: Medicare HMO

## 2016-07-14 ENCOUNTER — Telehealth: Payer: Self-pay | Admitting: Internal Medicine

## 2016-07-14 ENCOUNTER — Ambulatory Visit (HOSPITAL_BASED_OUTPATIENT_CLINIC_OR_DEPARTMENT_OTHER): Payer: Medicare HMO | Admitting: Internal Medicine

## 2016-07-14 VITALS — BP 123/57 | HR 67 | Temp 98.1°F | Resp 18 | Wt 171.7 lb

## 2016-07-14 DIAGNOSIS — D693 Immune thrombocytopenic purpura: Secondary | ICD-10-CM

## 2016-07-14 DIAGNOSIS — I10 Essential (primary) hypertension: Secondary | ICD-10-CM | POA: Diagnosis not present

## 2016-07-14 LAB — CBC WITH DIFFERENTIAL/PLATELET
BASO%: 0.2 % (ref 0.0–2.0)
Basophils Absolute: 0 10*3/uL (ref 0.0–0.1)
EOS%: 3.9 % (ref 0.0–7.0)
Eosinophils Absolute: 0.2 10*3/uL (ref 0.0–0.5)
HCT: 39.9 % (ref 38.4–49.9)
HGB: 14.3 g/dL (ref 13.0–17.1)
LYMPH%: 24.9 % (ref 14.0–49.0)
MCH: 34.3 pg — ABNORMAL HIGH (ref 27.2–33.4)
MCHC: 35.8 g/dL (ref 32.0–36.0)
MCV: 95.7 fL (ref 79.3–98.0)
MONO#: 0.5 10*3/uL (ref 0.1–0.9)
MONO%: 10.1 % (ref 0.0–14.0)
NEUT%: 60.9 % (ref 39.0–75.0)
NEUTROS ABS: 3 10*3/uL (ref 1.5–6.5)
PLATELETS: 88 10*3/uL — AB (ref 140–400)
RBC: 4.17 10*6/uL — AB (ref 4.20–5.82)
RDW: 12.6 % (ref 11.0–14.6)
WBC: 4.9 10*3/uL (ref 4.0–10.3)
lymph#: 1.2 10*3/uL (ref 0.9–3.3)

## 2016-07-14 LAB — COMPREHENSIVE METABOLIC PANEL
ALK PHOS: 60 U/L (ref 40–150)
ALT: 33 U/L (ref 0–55)
AST: 31 U/L (ref 5–34)
Albumin: 3.4 g/dL — ABNORMAL LOW (ref 3.5–5.0)
Anion Gap: 7 mEq/L (ref 3–11)
BUN: 10.4 mg/dL (ref 7.0–26.0)
CHLORIDE: 103 meq/L (ref 98–109)
CO2: 24 mEq/L (ref 22–29)
Calcium: 9 mg/dL (ref 8.4–10.4)
Creatinine: 0.8 mg/dL (ref 0.7–1.3)
EGFR: 86 mL/min/{1.73_m2} — ABNORMAL LOW (ref >90)
Glucose: 179 mg/dl — ABNORMAL HIGH (ref 70–140)
Potassium: 4.4 mEq/L (ref 3.5–5.1)
SODIUM: 135 meq/L — AB (ref 136–145)
TOTAL PROTEIN: 6.8 g/dL (ref 6.4–8.3)
Total Bilirubin: 1.67 mg/dL — ABNORMAL HIGH (ref 0.20–1.20)

## 2016-07-14 LAB — LACTATE DEHYDROGENASE: LDH: 234 U/L (ref 125–245)

## 2016-07-14 NOTE — Telephone Encounter (Signed)
Appointments scheduled per 1/2 LOS. Appointments scheduled per 1/2 LOS. Patient given AVS report and calendars with future scheduled appointments.

## 2016-07-14 NOTE — Progress Notes (Signed)
[facility] [phone]   Fax:([phone]) [place], MD [address] [place] [address]  DIAGNOSIS: Idiopathic thrombocytopenic purpura   PRIOR THERAPY: None   CURRENT THERAPY: Observation.  INTERVAL HISTORY: [name] 76 y.o. male came to the clinic today for annual follow-up visit accompanied by his wife. The patient is feeling fine today with no specific complaints. He denied having any bleeding, bruises or ecchymosis. He denied having any fatigue or weakness. He has no nausea, vomiting, diarrhea or constipation. He denied having any weight loss or night sweats. He has no chest pain, shortness of breath, cough or hemoptysis. She had repeat CBC performed earlier today and he is here for evaluation and discussion of his lab results.   MEDICAL HISTORY: Past Medical History:  Diagnosis Date  . CAD (coronary artery disease)    Hx of   . Diabetes mellitus   . Diverticulosis of colon (without mention of hemorrhage) 2011   Colonoscopy   . Hiatal hernia 2011   EGD  . Hypertension   . ITP (idiopathic thrombocytopenic purpura)    He has been diagnosed with  . Pancytopenia     ALLERGIES:  is allergic to crestor [rosuvastatin calcium]; lipitor [atorvastatin calcium]; and zocor [simvastatin].  MEDICATIONS:  Current Outpatient Prescriptions  Medication Sig Dispense Refill  . ACCU-CHEK AVIVA PLUS test strip As directed    . clopidogrel (PLAVIX) 75 MG tablet Take 1 tablet (75 mg total) by mouth daily. 90 tablet 3  . FLUZONE HIGH-DOSE 0.5 ML SUSY Inject as directed once.  0  . isosorbide mononitrate (IMDUR) 30 MG 24 hr tablet Take 1 tablet (30 mg total) by mouth daily. 90 tablet 3  . Lancets (ACCU-CHEK MULTICLIX) lancets As directed    . lisinopril (PRINIVIL,ZESTRIL) 10 MG tablet Take 10 mg by mouth daily.     . metFORMIN (GLUCOPHAGE) 500 MG tablet Take 500 mg by mouth 3 (three) times daily.     . Multiple  Vitamin (MULTIVITAMIN) tablet Take 1 tablet by mouth daily.      . niacin 500 MG tablet Take 500 mg by mouth 3 (three) times daily with meals.      . nitroGLYCERIN (NITROSTAT) 0.4 MG SL tablet Place 0.4 mg under the tongue every 5 (five) minutes as needed.       No current facility-administered medications for this visit.     SURGICAL HISTORY:  Past Surgical History:  Procedure Laterality Date  . CARDIAC CATHETERIZATION     His last heart catheterization in May of 2009 reveals a patent LIMA  . CORONARY ANGIOPLASTY     Successful percutaneous transluminal coronary angioplasty of the left circumflex obuse marginal vessel  . CORONARY ANGIOPLASTY WITH STENT PLACEMENT    . CORONARY ARTERY BYPASS GRAFT     x4 with a left internal mammary artery anastomosis to the left anterior descending coronary artery   . CORONARY STENT PLACEMENT     successful percutaneous transluminal coronary angioplasty and stenting of the left main coronary artery  . HERNIA REPAIR    . SAPHENOUS VEIN GRAFT RESECTION     graft to the first obtuse marginal, a saphenous vein graft to the first diagonal coronary artery  and a saphenous vein graft to the distal right coronary artery  . US ECHOCARDIOGRAPHY  02-08-2007   Est. EF 40-45%    REVIEW OF SYSTEMS:  A comprehensive review of systems was negative.  PHYSICAL EXAMINATION: General appearance: alert, cooperative and no distress Head: Normocephalic, without obvious abnormality, atraumatic Neck: no adenopathy Lymph nodes: Cervical, supraclavicular, and axillary nodes normal. Resp: clear to auscultation bilaterally Back: symmetric, no curvature. ROM normal. No CVA tenderness. Cardio: regular rate and rhythm, S1, S2 normal, no murmur, click, rub or gallop GI: soft, non-tender; bowel sounds normal; no masses,  no organomegaly Extremities: extremities normal, atraumatic, no cyanosis or edema  ECOG PERFORMANCE STATUS: 0 - Asymptomatic  Blood pressure (!) 123/57, pulse  67, temperature 98.1 F (36.7 C), temperature source Oral, resp. rate 18, weight 171 lb 11.2 oz (77.9 kg), SpO2 99 %.  LABORATORY DATA: Lab Results  Component Value Date   WBC 4.9 07/14/2016   HGB 14.3 07/14/2016   HCT 39.9 07/14/2016   MCV 95.7 07/14/2016   PLT 88 (L) 07/14/2016      Chemistry      Component Value Date/Time   NA 136 05/11/2016 1002   NA 134 (L) 07/16/2015 0911   K 4.2 05/11/2016 1002   K 4.5 07/16/2015 0911   CL 103 05/11/2016 1002   CL 106 07/19/2012 1028   CO2 26 05/11/2016 1002   CO2 26 07/16/2015 0911   BUN 13 05/11/2016 1002   BUN 12.3 07/16/2015 0911   CREATININE 0.75 05/11/2016 1002   CREATININE 0.8 07/16/2015 0911      Component Value Date/Time   CALCIUM 8.8 05/11/2016 1002   CALCIUM 8.8 07/16/2015 0911   ALKPHOS 62 05/11/2016 1002   ALKPHOS 72 07/16/2015 0911   AST 34 05/11/2016 1002   AST 35 (H) 07/16/2015 0911   ALT 29 05/11/2016 1002   ALT 34 07/16/2015 0911   BILITOT 1.3 (H) 05/11/2016 1002   BILITOT 1.35 (H) 07/16/2015 0911       RADIOGRAPHIC STUDIES: No results found.  ASSESSMENT AND PLAN:  This is a very pleasant 76 years old white male with idiopathic thrombocytopenic purpura. The patient has been on observation and no significant complaints. He denied having any bleeding, bruises or ecchymosis. His platelets count are 88,000 today. I recommended for the patient to continue on observation with repeat CBC and LDH in one year. He was advised to call immediately if he has any concerning symptoms in the interval. All questions were answered. The patient knows to call the clinic with any problems, questions or concerns. We can certainly see the patient much sooner if necessary. I spent 10 minutes counseling the patient face to face. The total time spent in the appointment was 15 minutes. Disclaimer: This note was dictated with voice recognition software. Similar sounding words can inadvertently be transcribed and may not be  corrected upon review.

## 2016-07-24 ENCOUNTER — Other Ambulatory Visit: Payer: Self-pay

## 2016-07-24 DIAGNOSIS — D229 Melanocytic nevi, unspecified: Secondary | ICD-10-CM | POA: Diagnosis not present

## 2016-07-24 DIAGNOSIS — C44311 Basal cell carcinoma of skin of nose: Secondary | ICD-10-CM | POA: Diagnosis not present

## 2016-07-24 DIAGNOSIS — L720 Epidermal cyst: Secondary | ICD-10-CM | POA: Diagnosis not present

## 2016-08-04 DIAGNOSIS — R69 Illness, unspecified: Secondary | ICD-10-CM | POA: Diagnosis not present

## 2016-08-12 DIAGNOSIS — R69 Illness, unspecified: Secondary | ICD-10-CM | POA: Diagnosis not present

## 2016-08-27 DIAGNOSIS — Z6825 Body mass index (BMI) 25.0-25.9, adult: Secondary | ICD-10-CM | POA: Diagnosis not present

## 2016-08-27 DIAGNOSIS — I10 Essential (primary) hypertension: Secondary | ICD-10-CM | POA: Diagnosis not present

## 2016-08-27 DIAGNOSIS — Z7902 Long term (current) use of antithrombotics/antiplatelets: Secondary | ICD-10-CM | POA: Diagnosis not present

## 2016-08-27 DIAGNOSIS — Z0000 Encounter for general adult medical examination without abnormal findings: Secondary | ICD-10-CM | POA: Diagnosis not present

## 2016-08-27 DIAGNOSIS — D693 Immune thrombocytopenic purpura: Secondary | ICD-10-CM | POA: Diagnosis not present

## 2016-08-27 DIAGNOSIS — I209 Angina pectoris, unspecified: Secondary | ICD-10-CM | POA: Diagnosis not present

## 2016-08-27 DIAGNOSIS — M25519 Pain in unspecified shoulder: Secondary | ICD-10-CM | POA: Diagnosis not present

## 2016-08-27 DIAGNOSIS — Z7984 Long term (current) use of oral hypoglycemic drugs: Secondary | ICD-10-CM | POA: Diagnosis not present

## 2016-08-27 DIAGNOSIS — E1140 Type 2 diabetes mellitus with diabetic neuropathy, unspecified: Secondary | ICD-10-CM | POA: Diagnosis not present

## 2016-08-27 DIAGNOSIS — G4700 Insomnia, unspecified: Secondary | ICD-10-CM | POA: Diagnosis not present

## 2016-09-17 DIAGNOSIS — C44311 Basal cell carcinoma of skin of nose: Secondary | ICD-10-CM | POA: Diagnosis not present

## 2016-10-04 ENCOUNTER — Encounter (HOSPITAL_COMMUNITY): Payer: Self-pay

## 2016-10-04 ENCOUNTER — Emergency Department (HOSPITAL_COMMUNITY)
Admission: EM | Admit: 2016-10-04 | Discharge: 2016-10-05 | Disposition: A | Discharge status: Home / Self Care | Payer: Medicare HMO | Attending: Emergency Medicine | Admitting: Emergency Medicine

## 2016-10-04 ENCOUNTER — Emergency Department (HOSPITAL_COMMUNITY): Payer: Medicare HMO

## 2016-10-04 DIAGNOSIS — Y999 Unspecified external cause status: Secondary | ICD-10-CM | POA: Insufficient documentation

## 2016-10-04 DIAGNOSIS — Z951 Presence of aortocoronary bypass graft: Secondary | ICD-10-CM | POA: Diagnosis not present

## 2016-10-04 DIAGNOSIS — W19XXXA Unspecified fall, initial encounter: Secondary | ICD-10-CM

## 2016-10-04 DIAGNOSIS — Z955 Presence of coronary angioplasty implant and graft: Secondary | ICD-10-CM | POA: Insufficient documentation

## 2016-10-04 DIAGNOSIS — Z23 Encounter for immunization: Secondary | ICD-10-CM | POA: Insufficient documentation

## 2016-10-04 DIAGNOSIS — S62367A Nondisplaced fracture of neck of fifth metacarpal bone, left hand, initial encounter for closed fracture: Secondary | ICD-10-CM | POA: Insufficient documentation

## 2016-10-04 DIAGNOSIS — I509 Heart failure, unspecified: Secondary | ICD-10-CM | POA: Diagnosis not present

## 2016-10-04 DIAGNOSIS — E119 Type 2 diabetes mellitus without complications: Secondary | ICD-10-CM | POA: Insufficient documentation

## 2016-10-04 DIAGNOSIS — I2510 Atherosclerotic heart disease of native coronary artery without angina pectoris: Secondary | ICD-10-CM | POA: Diagnosis not present

## 2016-10-04 DIAGNOSIS — W0110XA Fall on same level from slipping, tripping and stumbling with subsequent striking against unspecified object, initial encounter: Secondary | ICD-10-CM | POA: Diagnosis not present

## 2016-10-04 DIAGNOSIS — Z7984 Long term (current) use of oral hypoglycemic drugs: Secondary | ICD-10-CM | POA: Diagnosis not present

## 2016-10-04 DIAGNOSIS — S0990XA Unspecified injury of head, initial encounter: Secondary | ICD-10-CM | POA: Insufficient documentation

## 2016-10-04 DIAGNOSIS — Z7902 Long term (current) use of antithrombotics/antiplatelets: Secondary | ICD-10-CM | POA: Insufficient documentation

## 2016-10-04 DIAGNOSIS — M79642 Pain in left hand: Secondary | ICD-10-CM | POA: Diagnosis not present

## 2016-10-04 DIAGNOSIS — Y92410 Unspecified street and highway as the place of occurrence of the external cause: Secondary | ICD-10-CM | POA: Insufficient documentation

## 2016-10-04 DIAGNOSIS — S6992XA Unspecified injury of left wrist, hand and finger(s), initial encounter: Secondary | ICD-10-CM | POA: Diagnosis present

## 2016-10-04 DIAGNOSIS — Y939 Activity, unspecified: Secondary | ICD-10-CM | POA: Insufficient documentation

## 2016-10-04 DIAGNOSIS — I110 Hypertensive heart disease with heart failure: Secondary | ICD-10-CM | POA: Diagnosis not present

## 2016-10-04 DIAGNOSIS — G44309 Post-traumatic headache, unspecified, not intractable: Secondary | ICD-10-CM | POA: Diagnosis not present

## 2016-10-04 DIAGNOSIS — S199XXA Unspecified injury of neck, initial encounter: Secondary | ICD-10-CM | POA: Diagnosis not present

## 2016-10-04 DIAGNOSIS — S60222A Contusion of left hand, initial encounter: Secondary | ICD-10-CM

## 2016-10-04 LAB — CBG MONITORING, ED: Glucose-Capillary: 231 mg/dL — ABNORMAL HIGH (ref 65–99)

## 2016-10-04 MED ORDER — ACETAMINOPHEN 500 MG PO TABS: 500.0000 mg | ORAL_TABLET | Freq: Four times a day (QID) | ORAL | 0 refills | Status: DC | PRN

## 2016-10-04 MED ORDER — TETANUS-DIPHTH-ACELL PERTUSSIS 5-2.5-18.5 LF-MCG/0.5 IM SUSP
0.5000 mL | Freq: Once | INTRAMUSCULAR | Status: AC
  Administered 2016-10-04: 0.5 mL via INTRAMUSCULAR
  Filled 2016-10-04: qty 0.5

## 2016-10-04 MED ORDER — BACITRACIN ZINC 500 UNIT/GM EX OINT: 1.0000 "application " | TOPICAL_OINTMENT | Freq: Two times a day (BID) | CUTANEOUS | Status: DC

## 2016-10-04 MED ORDER — BACITRACIN ZINC 500 UNIT/GM EX OINT: 1.0000 "application " | TOPICAL_OINTMENT | Freq: Two times a day (BID) | CUTANEOUS | 0 refills | Status: DC

## 2016-10-04 NOTE — ED Notes (Signed)
Ortho tech contacted for sugartong splint on L

## 2016-10-04 NOTE — ED Triage Notes (Signed)
Pt complaining of fall at home. Pt states fell backwards, struck L hand on concrete. Pt with multiple abrasions to L hand.  Pt with full ROM, full sensation. Pt unsure if struck head. Pt denies any headache/dizziness/blurred vision. Pt a/o x 4 at triage. Pt states on blood thinners, has low platelets. Bleeding controlled at triage.

## 2016-10-04 NOTE — ED Provider Notes (Signed)
[facility] DEPT Provider Note   CSN: [id number] Arrival date & time: 10/04/16  1935     History   Chief Complaint Chief Complaint  Patient presents with  . Fall  . Extremity Laceration    HPI [name] is a 76 y.o. male.  [name] is a 76 y.o. Male with history diabetes, pancytopenia, and on Plavix who presents the emergency department after a trip and fall prior to arrival. Patient reports he tripped on a rock and fell while holding a glass plate. He reports landing on his left hand and on his head. He denies loss of consciousness. He reports some bleeding to his left hand. He reports developing a mild headache since the fall. He denies any neck or back pain. He denies other pain or injury. He is unsure when his last tetanus shot was. He denies fevers, double vision, neck pain, back pain, numbness, tingling, weakness, chest pain, shortness of breath, nausea, vomiting, or other complaints.    The history is provided by the patient and a relative. No language interpreter was used.  Fall  Associated symptoms include headaches. Pertinent negatives include no chest pain, no abdominal pain and no shortness of breath.    Past Medical History:  Diagnosis Date  . CAD (coronary artery disease)    Hx of   . Diabetes mellitus   . Diverticulosis of colon (without mention of hemorrhage) 2011   Colonoscopy   . Hiatal hernia 2011   EGD  . Hypertension   . ITP (idiopathic thrombocytopenic purpura)    He has been diagnosed with  . Pancytopenia     Patient Active Problem List   Diagnosis Date Noted  . Thrombocytopenia, unspecified 01/24/2013  . Cellulitis 03/19/2012  . Idiopathic thrombocytopenic purpura ([facility]) 01/26/2012  . Congestive heart failure ([facility]) 09/29/2011  . Orthostatic hypotension 04/14/2011  . Benign essential HTN 07/08/2010  . DIVERTICULOSIS-COLON 05/12/2010  . HIATAL HERNIA WITH REFLUX 03/25/2010  . Pancytopenia ([facility]) 02/14/2010  . DM (diabetes  mellitus), type 2, uncontrolled with complications ([facility]) 27/25/3664  . CAD (coronary artery disease) 02/12/2010  . WEIGHT LOSS-ABNORMAL 02/12/2010  . TRANSAMINASES, SERUM, ELEVATED 02/12/2010    Past Surgical History:  Procedure Laterality Date  . CARDIAC CATHETERIZATION     His last heart catheterization in May of 2009 reveals a patent LIMA  . CORONARY ANGIOPLASTY     Successful percutaneous transluminal coronary angioplasty of the left circumflex obuse marginal vessel  . CORONARY ANGIOPLASTY WITH STENT PLACEMENT    . CORONARY ARTERY BYPASS GRAFT     x4 with a left internal mammary artery anastomosis to the left anterior descending coronary artery   . CORONARY STENT PLACEMENT     successful percutaneous transluminal coronary angioplasty and stenting of the left main coronary artery  . HERNIA REPAIR    . SAPHENOUS VEIN GRAFT RESECTION     graft to the first obtuse marginal, a saphenous vein graft to the first diagonal coronary artery  and a saphenous vein graft to the distal right coronary artery  . US ECHOCARDIOGRAPHY  02-08-2007   Est. EF 40-45%       Home Medications    Prior to Admission medications   Medication Sig Start Date End Date Taking? Authorizing Provider  clopidogrel (PLAVIX) 75 MG tablet Take 1 tablet (75 mg total) by mouth daily. 11/12/15  Yes Thayer Headings, MD  isosorbide mononitrate (IMDUR) 30 MG 24 hr tablet Take 1 tablet (30 mg total) by mouth daily. 11/14/15  Yes Thayer Headings, MD  lisinopril (PRINIVIL,ZESTRIL) 5 MG tablet Take 5 mg by mouth daily. 08/26/16  Yes Historical Provider, MD  metFORMIN (GLUCOPHAGE) 500 MG tablet Take 500 mg by mouth 3 (three) times daily.    Yes Historical Provider, MD  Multiple Vitamin (MULTIVITAMIN) tablet Take 1 tablet by mouth daily.     Yes Historical Provider, MD  niacin 500 MG tablet Take 500 mg by mouth 3 (three) times daily with meals.     Yes Historical Provider, MD  nitroGLYCERIN (NITROSTAT) 0.4 MG SL tablet Place 0.4 mg  under the tongue every 5 (five) minutes as needed.     Yes Historical Provider, MD  acetaminophen (TYLENOL) 500 MG tablet Take 1 tablet (500 mg total) by mouth every 6 (six) hours as needed for mild pain or moderate pain. 10/04/16   Waynetta Pean, PA-C  bacitracin ointment Apply 1 application topically 2 (two) times daily. 10/04/16   Waynetta Pean, PA-C    Family History Family History  Problem Relation Age of Onset  . Colon cancer Neg Hx     Social History Social History  Substance Use Topics  . Smoking status: Never Smoker  . Smokeless tobacco: Never Used  . Alcohol use No     Allergies   Lipitor [atorvastatin calcium]; Crestor [rosuvastatin calcium]; and Zocor [simvastatin]   Review of Systems Review of Systems  Constitutional: Negative for chills and fever.  HENT: Negative for congestion and sore throat.   Eyes: Negative for visual disturbance.  Respiratory: Negative for cough and shortness of breath.   Cardiovascular: Negative for chest pain.  Gastrointestinal: Negative for abdominal pain, diarrhea, nausea and vomiting.  Genitourinary: Negative for dysuria.  Musculoskeletal: Positive for arthralgias. Negative for back pain and neck pain.  Skin: Positive for wound. Negative for rash.  Neurological: Positive for headaches. Negative for dizziness, syncope, speech difficulty, weakness, light-headedness and numbness.     Physical Exam Updated Vital Signs BP 122/63   Pulse 66   Temp 98.5 F (36.9 C) (Oral)   Resp 16   SpO2 97%   Physical Exam  Constitutional: He is oriented to person, place, and time. He appears well-developed and well-nourished. No distress.  Nontoxic appearing.  HENT:  Head: Normocephalic.  Right Ear: External ear normal.  Left Ear: External ear normal.  Mouth/Throat: Oropharynx is clear and moist.  Small abrasions noted to his forehead. No other signs of head injury or trauma. No facial bone tenderness to palpation. No nosebleed. Bilateral  tympanic membranes are pearly-gray without erythema or loss of landmarks.   Eyes: Conjunctivae and EOM are normal. Pupils are equal, round, and reactive to light. Right eye exhibits no discharge. Left eye exhibits no discharge.  Neck: Normal range of motion. Neck supple.  No midline neck tenderness to palpation.  Cardiovascular: Normal rate, regular rhythm, normal heart sounds and intact distal pulses.  Exam reveals no gallop and no friction rub.   No murmur heard. Pulmonary/Chest: Effort normal and breath sounds normal. No respiratory distress. He has no wheezes. He has no rales. He exhibits no tenderness.  Lungs clear to auscultation bilaterally. Symmetric chest expansion bilaterally. No chest wall tenderness to palpation.  Abdominal: Soft. There is no tenderness. There is no guarding.  Musculoskeletal: Normal range of motion. He exhibits tenderness. He exhibits no edema or deformity.  Mild tenderness overlying the dorsum of his left hand with small abrasions and hematoma. Ventricles are normal alignment of his left hand. He is able to make a  fist with his left hand. No bony point tenderness or deformity noted. Patient's bilateral clavicles are nontender to palpation. No midline back tenderness to palpation. Patient's bilateral shoulder, elbow, wrist, hip, knee and ankle joints are supple and nontender to palpation.  Lymphadenopathy:    He has no cervical adenopathy.  Neurological: He is alert and oriented to person, place, and time. No cranial nerve deficit or sensory deficit. He exhibits normal muscle tone. Coordination normal.  The patient is alert and oriented 3. Cranial nerves are intact. Speech is clear and coherent. EOMs are intact. Vision is grossly intact. No pronator drift. Sensation is intact his bilateral upper and lower extremities.  Skin: Skin is warm and dry. Capillary refill takes less than 2 seconds. No rash noted. He is not diaphoretic. No erythema. No pallor.  See  musculoskeletal.   Psychiatric: He has a normal mood and affect. His behavior is normal.  Nursing note and vitals reviewed.    ED Treatments / Results  Labs (all labs ordered are listed, but only abnormal results are displayed) Labs Reviewed  CBG MONITORING, ED - Abnormal; Notable for the following:       Result Value   Glucose-Capillary 231 (*)    All other components within normal limits    EKG  EKG Interpretation None       Radiology Ct Head Wo Contrast  Result Date: 10/04/2016 CLINICAL DATA:  Posttraumatic headache after fall. EXAM: CT HEAD WITHOUT CONTRAST CT CERVICAL SPINE WITHOUT CONTRAST TECHNIQUE: Multidetector CT imaging of the head and cervical spine was performed following the standard protocol without intravenous contrast. Multiplanar CT image reconstructions of the cervical spine were also generated. COMPARISON:  None. FINDINGS: CT HEAD FINDINGS Brain: Mild diffuse cortical atrophy is noted. Mild chronic ischemic white matter disease is noted. No mass effect or midline shift is noted. Ventricular size is within normal limits. There is no evidence of mass lesion, hemorrhage or acute infarction. Vascular: Atherosclerosis carotid siphons is noted. Skull: Normal. Negative for fracture or focal lesion. Sinuses/Orbits: No acute finding. Other: None. CT CERVICAL SPINE FINDINGS Alignment: Mild grade 1 anterolisthesis of C4-5 and C5-6 is noted. Skull base and vertebrae: No acute fracture. No primary bone lesion or focal pathologic process. Soft tissues and spinal canal: No prevertebral fluid or swelling. No visible canal hematoma. Disc levels: Severe degenerative disc disease is noted at C6-7 with anterior and posterior osteophyte formation. Upper chest: Negative. Other: None. IMPRESSION: Mild diffuse cortical atrophy. Mild chronic ischemic white matter disease. No acute intracranial abnormality seen. Severe degenerative disc disease is noted at C6-7. No acute abnormality seen in the  cervical spine. Electronically Signed   By: Marijo Conception, M.D.   On: 10/04/2016 21:33   Ct Cervical Spine Wo Contrast  Result Date: 10/04/2016 CLINICAL DATA:  Posttraumatic headache after fall. EXAM: CT HEAD WITHOUT CONTRAST CT CERVICAL SPINE WITHOUT CONTRAST TECHNIQUE: Multidetector CT imaging of the head and cervical spine was performed following the standard protocol without intravenous contrast. Multiplanar CT image reconstructions of the cervical spine were also generated. COMPARISON:  None. FINDINGS: CT HEAD FINDINGS Brain: Mild diffuse cortical atrophy is noted. Mild chronic ischemic white matter disease is noted. No mass effect or midline shift is noted. Ventricular size is within normal limits. There is no evidence of mass lesion, hemorrhage or acute infarction. Vascular: Atherosclerosis carotid siphons is noted. Skull: Normal. Negative for fracture or focal lesion. Sinuses/Orbits: No acute finding. Other: None. CT CERVICAL SPINE FINDINGS Alignment: Mild grade 1 anterolisthesis  of C4-5 and C5-6 is noted. Skull base and vertebrae: No acute fracture. No primary bone lesion or focal pathologic process. Soft tissues and spinal canal: No prevertebral fluid or swelling. No visible canal hematoma. Disc levels: Severe degenerative disc disease is noted at C6-7 with anterior and posterior osteophyte formation. Upper chest: Negative. Other: None. IMPRESSION: Mild diffuse cortical atrophy. Mild chronic ischemic white matter disease. No acute intracranial abnormality seen. Severe degenerative disc disease is noted at C6-7. No acute abnormality seen in the cervical spine. Electronically Signed   By: Marijo Conception, M.D.   On: 10/04/2016 21:33   Dg Hand Complete Left  Result Date: 10/04/2016 CLINICAL DATA:  Left hand pain after fall at home yesterday. Struck left hand on concrete, multiple abrasions. EXAM: LEFT HAND - COMPLETE 3+ VIEW COMPARISON:  None. FINDINGS: Nondisplaced fracture of the distal fifth  metacarpal is best appreciated on the oblique view. There is associated dorsal soft tissue edema. No additional acute fracture. Advanced degenerative change at the thumb carpal metacarpal joint. Lesser osteoarthritis elsewhere. No radiopaque foreign body. IMPRESSION: Nondisplaced distal fifth metacarpal fracture with associated soft tissue edema. Electronically Signed   By: Jeb Levering M.D.   On: 10/04/2016 21:34    Procedures Procedures (including critical care time)  Medications Ordered in ED Medications  bacitracin ointment 1 application (not administered)  Tdap (BOOSTRIX) injection 0.5 mL (0.5 mLs Intramuscular Given 10/04/16 2305)     Initial Impression / Assessment and Plan / ED Course  I have reviewed the triage vital signs and the nursing notes.  Pertinent labs & imaging results that were available during my care of the patient were reviewed by me and considered in my medical decision making (see chart for details).    This is a 76 y.o. Male with history diabetes, pancytopenia, and on Plavix who presents the emergency department after a trip and fall prior to arrival. Patient reports he tripped on a rock and fell while holding a glass plate. He reports landing on his left hand and on his head. He denies loss of consciousness. He reports some bleeding to his left hand. He reports developing a mild headache since the fall. He denies any neck or back pain. He denies other pain or injury. He is unsure when his last tetanus shot was. On exam the patient is afebrile nontoxic appearing. He has no focal neurological deficits. There is an abrasion overlying the dorsum of his left hand. He is able to make a fist and his knuckles are in alignment. He is neurovascular intact. CT of his head and cervical spine showed no acute intracranial abnormality. X-ray of his left hand shows a nondisplaced fifth metacarpal fracture. Wound care was performed his skin tears and plan is for sugar tong  splint. Orthotopic his participation and reports he feels the patient tolerated an ulnar gutter splint better. We discussed wound care instructions. He will follow-up with hand surgeon Dr. Amedeo Plenty for the fracture. I discussed cast and splint care and precautions. I discussed head injury precautions. I advised the patient to follow-up with their primary care provider this week. I advised the patient to return to the emergency department with new or worsening symptoms or new concerns. The patient and his family verbalized understanding and agreement with plan.    This patient was discussed with and evaluated by Dr. Lacinda Axon who agrees with assessment and plan.   Final Clinical Impressions(s) / ED Diagnoses   Final diagnoses:  Fall, initial encounter  Minor head  injury, initial encounter  Contusion of left hand, initial encounter  Closed nondisplaced fracture of neck of fifth metacarpal bone of left hand, initial encounter  Need for Tdap vaccination    New Prescriptions New Prescriptions   ACETAMINOPHEN (TYLENOL) 500 MG TABLET    Take 1 tablet (500 mg total) by mouth every 6 (six) hours as needed for mild pain or moderate pain.   BACITRACIN OINTMENT    Apply 1 application topically 2 (two) times daily.     Waynetta Pean, PA-C 10/05/16 0030    Nat Christen, MD 10/06/16 [phone]

## 2016-10-04 NOTE — ED Notes (Signed)
Pt sent to [facility] primary RN notified

## 2016-10-04 NOTE — ED Notes (Signed)
CBG is 231.

## 2016-10-05 DIAGNOSIS — I2510 Atherosclerotic heart disease of native coronary artery without angina pectoris: Secondary | ICD-10-CM | POA: Diagnosis not present

## 2016-10-05 DIAGNOSIS — Z951 Presence of aortocoronary bypass graft: Secondary | ICD-10-CM | POA: Diagnosis not present

## 2016-10-05 DIAGNOSIS — S62309A Unspecified fracture of unspecified metacarpal bone, initial encounter for closed fracture: Secondary | ICD-10-CM | POA: Diagnosis not present

## 2016-10-05 DIAGNOSIS — I110 Hypertensive heart disease with heart failure: Secondary | ICD-10-CM | POA: Diagnosis not present

## 2016-10-05 DIAGNOSIS — E119 Type 2 diabetes mellitus without complications: Secondary | ICD-10-CM | POA: Diagnosis not present

## 2016-10-05 DIAGNOSIS — W0110XA Fall on same level from slipping, tripping and stumbling with subsequent striking against unspecified object, initial encounter: Secondary | ICD-10-CM | POA: Diagnosis not present

## 2016-10-05 DIAGNOSIS — I509 Heart failure, unspecified: Secondary | ICD-10-CM | POA: Diagnosis not present

## 2016-10-05 DIAGNOSIS — Z955 Presence of coronary angioplasty implant and graft: Secondary | ICD-10-CM | POA: Diagnosis not present

## 2016-10-05 DIAGNOSIS — S0990XA Unspecified injury of head, initial encounter: Secondary | ICD-10-CM | POA: Diagnosis not present

## 2016-10-05 DIAGNOSIS — S62367A Nondisplaced fracture of neck of fifth metacarpal bone, left hand, initial encounter for closed fracture: Secondary | ICD-10-CM | POA: Diagnosis not present

## 2016-10-05 DIAGNOSIS — Z23 Encounter for immunization: Secondary | ICD-10-CM | POA: Diagnosis not present

## 2016-10-05 NOTE — ED Notes (Signed)
Pt stable, ambulatory, states understanding of discharge instructions 

## 2016-10-05 NOTE — Progress Notes (Signed)
Orthopedic Tech Progress Note Patient Details:  [name] [birth year] [id number]  Ortho Devices Type of Ortho Device: Ulna gutter splint Ortho Device/Splint Location: Applied short arm splint (ulna gutter) to pt Left hand.  Family was concerned about dressing the wounds on hand.  i advised if needed they can remove ulna gutter address wounds and reapply splint for support.   pt tolerated application well.  (left hand) Ortho Device/Splint Interventions: Application  Doctor informed of splint used.  Ulna gutter was a better splint for pt's injury.  Kristopher Oppenheim 10/05/2016, 12:21 AM

## 2016-10-05 NOTE — ED Notes (Signed)
Ortho tech at bedside 

## 2016-10-08 DIAGNOSIS — M79642 Pain in left hand: Secondary | ICD-10-CM | POA: Diagnosis not present

## 2016-10-08 DIAGNOSIS — T148XXA Other injury of unspecified body region, initial encounter: Secondary | ICD-10-CM | POA: Diagnosis not present

## 2016-10-08 DIAGNOSIS — S62367A Nondisplaced fracture of neck of fifth metacarpal bone, left hand, initial encounter for closed fracture: Secondary | ICD-10-CM | POA: Diagnosis not present

## 2016-10-19 DIAGNOSIS — S62367D Nondisplaced fracture of neck of fifth metacarpal bone, left hand, subsequent encounter for fracture with routine healing: Secondary | ICD-10-CM | POA: Diagnosis not present

## 2016-11-02 DIAGNOSIS — S62367D Nondisplaced fracture of neck of fifth metacarpal bone, left hand, subsequent encounter for fracture with routine healing: Secondary | ICD-10-CM | POA: Diagnosis not present

## 2016-11-04 DIAGNOSIS — R69 Illness, unspecified: Secondary | ICD-10-CM | POA: Diagnosis not present

## 2016-11-09 DIAGNOSIS — E1149 Type 2 diabetes mellitus with other diabetic neurological complication: Secondary | ICD-10-CM | POA: Diagnosis not present

## 2016-11-09 DIAGNOSIS — E782 Mixed hyperlipidemia: Secondary | ICD-10-CM | POA: Diagnosis not present

## 2016-11-09 DIAGNOSIS — I2510 Atherosclerotic heart disease of native coronary artery without angina pectoris: Secondary | ICD-10-CM | POA: Diagnosis not present

## 2016-11-09 DIAGNOSIS — D528 Other folate deficiency anemias: Secondary | ICD-10-CM | POA: Diagnosis not present

## 2016-11-09 DIAGNOSIS — I10 Essential (primary) hypertension: Secondary | ICD-10-CM | POA: Diagnosis not present

## 2016-11-10 DIAGNOSIS — E119 Type 2 diabetes mellitus without complications: Secondary | ICD-10-CM | POA: Diagnosis not present

## 2016-11-10 DIAGNOSIS — H524 Presbyopia: Secondary | ICD-10-CM | POA: Diagnosis not present

## 2016-11-10 DIAGNOSIS — H04123 Dry eye syndrome of bilateral lacrimal glands: Secondary | ICD-10-CM | POA: Diagnosis not present

## 2016-11-10 DIAGNOSIS — H5203 Hypermetropia, bilateral: Secondary | ICD-10-CM | POA: Diagnosis not present

## 2016-11-10 DIAGNOSIS — H52203 Unspecified astigmatism, bilateral: Secondary | ICD-10-CM | POA: Diagnosis not present

## 2016-11-10 DIAGNOSIS — H40003 Preglaucoma, unspecified, bilateral: Secondary | ICD-10-CM | POA: Diagnosis not present

## 2016-11-10 DIAGNOSIS — Z961 Presence of intraocular lens: Secondary | ICD-10-CM | POA: Diagnosis not present

## 2016-11-11 ENCOUNTER — Other Ambulatory Visit: Payer: Self-pay | Admitting: Cardiovascular Disease

## 2016-11-12 DIAGNOSIS — S62367D Nondisplaced fracture of neck of fifth metacarpal bone, left hand, subsequent encounter for fracture with routine healing: Secondary | ICD-10-CM | POA: Diagnosis not present

## 2016-11-19 DIAGNOSIS — S62367D Nondisplaced fracture of neck of fifth metacarpal bone, left hand, subsequent encounter for fracture with routine healing: Secondary | ICD-10-CM | POA: Diagnosis not present

## 2016-11-23 DIAGNOSIS — S62367D Nondisplaced fracture of neck of fifth metacarpal bone, left hand, subsequent encounter for fracture with routine healing: Secondary | ICD-10-CM | POA: Diagnosis not present

## 2016-11-24 DIAGNOSIS — S62367D Nondisplaced fracture of neck of fifth metacarpal bone, left hand, subsequent encounter for fracture with routine healing: Secondary | ICD-10-CM | POA: Diagnosis not present

## 2016-11-26 DIAGNOSIS — R69 Illness, unspecified: Secondary | ICD-10-CM | POA: Diagnosis not present

## 2016-11-27 DIAGNOSIS — E1149 Type 2 diabetes mellitus with other diabetic neurological complication: Secondary | ICD-10-CM | POA: Diagnosis not present

## 2016-12-14 DIAGNOSIS — S62367D Nondisplaced fracture of neck of fifth metacarpal bone, left hand, subsequent encounter for fracture with routine healing: Secondary | ICD-10-CM | POA: Diagnosis not present

## 2016-12-15 DIAGNOSIS — H52203 Unspecified astigmatism, bilateral: Secondary | ICD-10-CM | POA: Diagnosis not present

## 2016-12-15 DIAGNOSIS — H40023 Open angle with borderline findings, high risk, bilateral: Secondary | ICD-10-CM | POA: Diagnosis not present

## 2016-12-15 DIAGNOSIS — H16223 Keratoconjunctivitis sicca, not specified as Sjogren's, bilateral: Secondary | ICD-10-CM | POA: Diagnosis not present

## 2016-12-15 DIAGNOSIS — H524 Presbyopia: Secondary | ICD-10-CM | POA: Diagnosis not present

## 2016-12-21 DIAGNOSIS — R69 Illness, unspecified: Secondary | ICD-10-CM | POA: Diagnosis not present

## 2016-12-21 DIAGNOSIS — E1142 Type 2 diabetes mellitus with diabetic polyneuropathy: Secondary | ICD-10-CM | POA: Diagnosis not present

## 2017-01-11 DIAGNOSIS — R69 Illness, unspecified: Secondary | ICD-10-CM | POA: Diagnosis not present

## 2017-01-20 DIAGNOSIS — R69 Illness, unspecified: Secondary | ICD-10-CM | POA: Diagnosis not present

## 2017-01-28 ENCOUNTER — Encounter: Payer: Self-pay | Admitting: Cardiovascular Disease

## 2017-01-28 ENCOUNTER — Ambulatory Visit (INDEPENDENT_AMBULATORY_CARE_PROVIDER_SITE_OTHER): Payer: Medicare HMO | Admitting: Cardiovascular Disease

## 2017-01-28 ENCOUNTER — Encounter (INDEPENDENT_AMBULATORY_CARE_PROVIDER_SITE_OTHER): Payer: Self-pay

## 2017-01-28 VITALS — BP 112/60 | HR 71 | Ht 70.0 in | Wt 171.8 lb

## 2017-01-28 DIAGNOSIS — E782 Mixed hyperlipidemia: Secondary | ICD-10-CM | POA: Diagnosis not present

## 2017-01-28 DIAGNOSIS — I2510 Atherosclerotic heart disease of native coronary artery without angina pectoris: Secondary | ICD-10-CM

## 2017-01-28 LAB — COMPREHENSIVE METABOLIC PANEL
ALT: 38 IU/L (ref 0–44)
AST: 46 IU/L — AB (ref 0–40)
Albumin/Globulin Ratio: 1.2 (ref 1.2–2.2)
Albumin: 3.6 g/dL (ref 3.5–4.8)
Alkaline Phosphatase: 97 IU/L (ref 39–117)
BUN/Creatinine Ratio: 16 (ref 10–24)
BUN: 12 mg/dL (ref 8–27)
Bilirubin Total: 1.1 mg/dL (ref 0.0–1.2)
CALCIUM: 9 mg/dL (ref 8.6–10.2)
CO2: 23 mmol/L (ref 20–29)
CREATININE: 0.75 mg/dL — AB (ref 0.76–1.27)
Chloride: 99 mmol/L (ref 96–106)
GFR, EST AFRICAN AMERICAN: 102 mL/min/{1.73_m2} (ref >59)
GFR, EST NON AFRICAN AMERICAN: 88 mL/min/{1.73_m2} (ref >59)
GLOBULIN, TOTAL: 3.1 g/dL (ref 1.5–4.5)
Glucose: 184 mg/dL — ABNORMAL HIGH (ref 65–99)
Potassium: 4.1 mmol/L (ref 3.5–5.2)
SODIUM: 135 mmol/L (ref 134–144)
TOTAL PROTEIN: 6.7 g/dL (ref 6.0–8.5)

## 2017-01-28 LAB — LIPID PANEL
CHOLESTEROL TOTAL: 152 mg/dL (ref 100–199)
Chol/HDL Ratio: 2.1 ratio (ref 0.0–5.0)
HDL: 74 mg/dL (ref >39)
LDL CALC: 62 mg/dL (ref 0–99)
Triglycerides: 81 mg/dL (ref 0–149)
VLDL Cholesterol Cal: 16 mg/dL (ref 5–40)

## 2017-01-28 NOTE — Patient Instructions (Signed)

## 2017-01-28 NOTE — Progress Notes (Signed)
Cardiology Office Note   Date:  01/28/2017   ID:  [name], DOB Aug 25, 1940, MRN [id number]  PCP:  Lillard Anes, MD  Cardiologist:   Mertie Moores, MD   Chief Complaint  Patient presents with  . Coronary Artery Disease   1. Coronary artery disease-status post MI and eventually s/p CABG  ( 2003) and also stenting ( 2007) 2. Hyperlipidemia-he has been generally intolerant of statins-Crestor causes muscle pain, Lipitor causes memory loss, Zocor causes muscle pain. 2. Diabetes mellitus  [name] is an 76 year old gentleman with a history of coronary artery disease. He status post coronary artery bypass grafting. He is also status post PTCA and stenting. He also has a history of hyperlipidemia and diabetes mellitus.   He is exercising regularly . He does lots of yard work. He enjoys going to his beach house.  January 05, 2013:  [name] is doing well. His garden is doing well. Occasional CP and dyspnea. Pains last only for a few seconds. Does not have to take a NTG. Mows the lawn and does yard work without problems.   Dec. 16, 2014:  [name] is doing well. Doing yard work without problems. Occasional episodes  December 12, 2013: [name] is doing ok. Rare CP . Frequent head aches  Dec. 1 , 2015:  No cardiac complaints.  Aches all over. "staggers " when he walks or stands. No angina. He notices That he has occasional episodes of dyspnea.    December 13, 2014:  [name] is a 76 y.o. male who presents for his  CAD. Very rare CP.  Has some DOE climbing up his hill at his house.  Nov. 29, 2016:  [name] continues to have some chest pain .    Seems to start in his lower abdomen and moves up. Will last for several days.  NTG works at times and at other times, has no effect.  Exercising some.   Fatigues easily .  Has leg pain with walking .   Thinks the diabetes has a lot to do with his fatigue . Glucose levels are ok  Goes to  his beach house in [place]   Nov 12, 2015:  Doing well from a cardiac standpoint. Got dizzy - does not know if he had a syncopal episode. Had not been out working much. He thinks he may have tripped on the step   Oct. 30 , 2017:  Doing well. No CP or dyspnea  Has had some sinus issues,  [facility] ,   Hx of CAD - intolerant to statins  - lots of muscle aches   January 28, 2017:  [name] is seen today . Seems to be stable.   Still has a little CP with walking up hills but this seems to be stable .  Does not tolerate statins Is on Niacin,   Last lipids level looks good.    Past Medical History:  Diagnosis Date  . CAD (coronary artery disease)    Hx of   . Diabetes mellitus   . Diverticulosis of colon (without mention of hemorrhage) 2011   Colonoscopy   . Hiatal hernia 2011   EGD  . Hypertension   . ITP (idiopathic thrombocytopenic purpura)    He has been diagnosed with  . Pancytopenia     Past Surgical History:  Procedure Laterality Date  . CARDIAC CATHETERIZATION     His last heart catheterization in May of 2009  reveals a patent LIMA  . CORONARY ANGIOPLASTY     Successful percutaneous transluminal coronary angioplasty of the left circumflex obuse marginal vessel  . CORONARY ANGIOPLASTY WITH STENT PLACEMENT    . CORONARY ARTERY BYPASS GRAFT     x4 with a left internal mammary artery anastomosis to the left anterior descending coronary artery   . CORONARY STENT PLACEMENT     successful percutaneous transluminal coronary angioplasty and stenting of the left main coronary artery  . HERNIA REPAIR    . SAPHENOUS VEIN GRAFT RESECTION     graft to the first obtuse marginal, a saphenous vein graft to the first diagonal coronary artery  and a saphenous vein graft to the distal right coronary artery  . US ECHOCARDIOGRAPHY  02-08-2007   Est. EF 40-45%     Current Outpatient Prescriptions  Medication Sig Dispense Refill  . clopidogrel (PLAVIX) 75 MG  tablet Take 1 tablet (75 mg total) by mouth daily. 90 tablet 1  . GLIPIZIDE XL 5 MG 24 hr tablet Take 5 mg by mouth 3 (three) times daily.    . isosorbide mononitrate (IMDUR) 30 MG 24 hr tablet Take 1 tablet (30 mg total) by mouth daily. 90 tablet 1  . lisinopril (PRINIVIL,ZESTRIL) 5 MG tablet Take 5 mg by mouth daily.    . metFORMIN (GLUCOPHAGE) 500 MG tablet Take 500 mg by mouth 3 (three) times daily.     . metoprolol tartrate (LOPRESSOR) 25 MG tablet Take 12.5 mg by mouth daily.    . Multiple Vitamin (MULTIVITAMIN) tablet Take 1 tablet by mouth daily.      . niacin 500 MG tablet Take 500 mg by mouth 3 (three) times daily with meals.      . nitroGLYCERIN (NITROSTAT) 0.4 MG SL tablet Place 0.4 mg under the tongue every 5 (five) minutes as needed.       No current facility-administered medications for this visit.     Allergies:   Lipitor [atorvastatin calcium]; Crestor [rosuvastatin calcium]; and Zocor [simvastatin]    Social History:  The patient  reports that he has never smoked. He has never used smokeless tobacco. He reports that he does not drink alcohol or use drugs.   Family History:  The patient's family history is not on file.   ROS:  Please see the history of present illness.   Review of Systems: Constitutional:  denies fever, chills, diaphoresis, appetite change and fatigue.  HEENT: denies photophobia, eye pain, redness, hearing loss, ear pain, congestion, sore throat, rhinorrhea, sneezing, neck pain, neck stiffness and tinnitus.  Respiratory: denies SOB, DOE, cough, chest tightness, and wheezing.  Cardiovascular: denies chest pain, palpitations and leg swelling.  Gastrointestinal: denies nausea, vomiting, abdominal pain, diarrhea, constipation, blood in stool.  Genitourinary: denies dysuria, urgency, frequency, hematuria, flank pain and difficulty urinating.  Musculoskeletal: denies  myalgias, back pain, joint swelling, arthralgias and gait problem.   Skin: denies pallor,  rash and wound.  Neurological: denies dizziness, seizures, syncope, weakness, light-headedness, numbness and headaches.   Hematological: denies adenopathy, easy bruising, personal or family bleeding history.  Psychiatric/ Behavioral: denies suicidal ideation, mood changes, confusion, nervousness, sleep disturbance and agitation.      All other systems are reviewed and negative.   PHYSICAL EXAM: VS:  BP 112/60 (BP Location: Left Arm, Patient Position: Sitting, Cuff Size: Normal)   Pulse 71   Ht 5\' 10"  (1.778 m)   Wt 171 lb 12.8 oz (77.9 kg)   SpO2 97%   BMI 24.65 kg/m  ,  BMI Body mass index is 24.65 kg/m. GEN: Well nourished, well developed, in no acute distress  HEENT: normal  Neck: no JVD, carotid bruits, or masses Cardiac: RRR; no murmurs, rubs, or gallops,no edema  Respiratory:  clear to auscultation bilaterally, normal work of breathing GI: soft, nontender, nondistended, + BS MS: no deformity or atrophy  Skin: warm and dry, no rash Neuro:  Strength and sensation are intact Psych: normal  EKG:  EKG is ordered today. The ekg ordered today demonstrates NSR at 71.   LAD.  No ST or T wave changes.   Recent Labs: 07/14/2016: ALT 33; BUN 10.4; Creatinine 0.8; HGB 14.3; Platelets 88; Potassium 4.4; Sodium 135   Lipid Panel    Component Value Date/Time   CHOL 162 05/11/2016 1002   TRIG 91 05/11/2016 1002   HDL 61 05/11/2016 1002   CHOLHDL 2.7 05/11/2016 1002   VLDL 18 05/11/2016 1002   LDLCALC 83 05/11/2016 1002      Wt Readings from Last 3 Encounters:  01/28/17 171 lb 12.8 oz (77.9 kg)  07/14/16 171 lb 11.2 oz (77.9 kg)  05/11/16 172 lb 9.6 oz (78.3 kg)      Other studies Reviewed: Additional studies/ records that were reviewed today include: . Review of the above records demonstrates:    ASSESSMENT AND PLAN:  1. Coronary artery disease-status post MI and eventually s/p CABG and also stenting -  No angina .  contniue meds   2. Hyperlipidemia-he has been  generally intolerant of statins-Crestor causes muscle pain, Lipitor causes memory loss, Zocor causes muscle pain. The niacin seems to be working fairly well.   We discussed the PSK-9 inhibitors.    Will check labs today   3. Diabetes mellitus - managed by his medical doctor   4. Bradycardia:    Improved after we DC'd the metoprolol   Current medicines are reviewed at length with the patient today.  The patient does not have concerns regarding medicines.  The following changes have been made:  no change  Labs/ tests ordered today include:   No orders of the defined types were placed in this encounter.    Disposition:   FU with me in 6 months with fasting labs.       Mertie Moores, MD  01/28/2017 9:14 AM    [place], [place], [place]  [address] Phone: [phone]; Fax: [phone]   [facility]  [address] [place], [place]  [address] [phone]    Fax [phone]

## 2017-01-29 ENCOUNTER — Telehealth: Payer: Self-pay | Admitting: Cardiovascular Disease

## 2017-01-29 DIAGNOSIS — E782 Mixed hyperlipidemia: Secondary | ICD-10-CM | POA: Insufficient documentation

## 2017-01-29 NOTE — Telephone Encounter (Signed)
New message   Pt calling back about lab results

## 2017-01-29 NOTE — Telephone Encounter (Signed)
Informed patient that Dr. Elmarie Shiley nurse will call him with the results after Dr. Acie Fredrickson has reviewed lab work.

## 2017-02-03 NOTE — Telephone Encounter (Signed)
Lab results reported to patient by Elisabeth Most, CMA.

## 2017-03-18 DIAGNOSIS — E1149 Type 2 diabetes mellitus with other diabetic neurological complication: Secondary | ICD-10-CM | POA: Diagnosis not present

## 2017-03-18 DIAGNOSIS — Z23 Encounter for immunization: Secondary | ICD-10-CM | POA: Diagnosis not present

## 2017-03-18 DIAGNOSIS — E782 Mixed hyperlipidemia: Secondary | ICD-10-CM | POA: Diagnosis not present

## 2017-03-18 DIAGNOSIS — I2510 Atherosclerotic heart disease of native coronary artery without angina pectoris: Secondary | ICD-10-CM | POA: Diagnosis not present

## 2017-03-18 DIAGNOSIS — N401 Enlarged prostate with lower urinary tract symptoms: Secondary | ICD-10-CM | POA: Diagnosis not present

## 2017-03-18 DIAGNOSIS — I10 Essential (primary) hypertension: Secondary | ICD-10-CM | POA: Diagnosis not present

## 2017-03-18 DIAGNOSIS — D530 Protein deficiency anemia: Secondary | ICD-10-CM | POA: Diagnosis not present

## 2017-04-09 DIAGNOSIS — R69 Illness, unspecified: Secondary | ICD-10-CM | POA: Diagnosis not present

## 2017-05-10 ENCOUNTER — Other Ambulatory Visit: Payer: Self-pay | Admitting: Cardiovascular Disease

## 2017-06-15 DIAGNOSIS — H40023 Open angle with borderline findings, high risk, bilateral: Secondary | ICD-10-CM | POA: Diagnosis not present

## 2017-06-15 DIAGNOSIS — H0012 Chalazion right lower eyelid: Secondary | ICD-10-CM | POA: Diagnosis not present

## 2017-06-15 DIAGNOSIS — H52203 Unspecified astigmatism, bilateral: Secondary | ICD-10-CM | POA: Diagnosis not present

## 2017-06-15 DIAGNOSIS — H524 Presbyopia: Secondary | ICD-10-CM | POA: Diagnosis not present

## 2017-06-15 DIAGNOSIS — H04123 Dry eye syndrome of bilateral lacrimal glands: Secondary | ICD-10-CM | POA: Diagnosis not present

## 2017-06-28 DIAGNOSIS — R69 Illness, unspecified: Secondary | ICD-10-CM | POA: Diagnosis not present

## 2017-07-07 DIAGNOSIS — R69 Illness, unspecified: Secondary | ICD-10-CM | POA: Diagnosis not present

## 2017-07-19 DIAGNOSIS — E1149 Type 2 diabetes mellitus with other diabetic neurological complication: Secondary | ICD-10-CM | POA: Diagnosis not present

## 2017-07-19 DIAGNOSIS — N401 Enlarged prostate with lower urinary tract symptoms: Secondary | ICD-10-CM | POA: Diagnosis not present

## 2017-07-19 DIAGNOSIS — E782 Mixed hyperlipidemia: Secondary | ICD-10-CM | POA: Diagnosis not present

## 2017-07-19 DIAGNOSIS — E1151 Type 2 diabetes mellitus with diabetic peripheral angiopathy without gangrene: Secondary | ICD-10-CM | POA: Diagnosis not present

## 2017-07-19 DIAGNOSIS — I2510 Atherosclerotic heart disease of native coronary artery without angina pectoris: Secondary | ICD-10-CM | POA: Diagnosis not present

## 2017-07-19 DIAGNOSIS — I10 Essential (primary) hypertension: Secondary | ICD-10-CM | POA: Diagnosis not present

## 2017-07-20 ENCOUNTER — Encounter: Payer: Self-pay | Admitting: Internal Medicine

## 2017-07-20 ENCOUNTER — Inpatient Hospital Stay (HOSPITAL_BASED_OUTPATIENT_CLINIC_OR_DEPARTMENT_OTHER): Payer: Medicare HMO | Admitting: Internal Medicine

## 2017-07-20 ENCOUNTER — Telehealth: Payer: Self-pay | Admitting: Internal Medicine

## 2017-07-20 ENCOUNTER — Inpatient Hospital Stay: Payer: Medicare HMO | Attending: Internal Medicine

## 2017-07-20 VITALS — BP 123/53 | HR 63 | Temp 98.1°F | Resp 18 | Ht 70.0 in | Wt 176.8 lb

## 2017-07-20 DIAGNOSIS — D693 Immune thrombocytopenic purpura: Secondary | ICD-10-CM | POA: Diagnosis not present

## 2017-07-20 LAB — LACTATE DEHYDROGENASE: LDH: 212 U/L (ref 125–245)

## 2017-07-20 LAB — CBC WITH DIFFERENTIAL/PLATELET
Abs Granulocyte: 2.1 10*3/uL (ref 1.5–6.5)
BASOS PCT: 1 %
Basophils Absolute: 0 10*3/uL (ref 0.0–0.1)
EOS ABS: 0.1 10*3/uL (ref 0.0–0.5)
EOS PCT: 4 %
HCT: 38.4 % (ref 38.4–49.9)
Hemoglobin: 13.1 g/dL (ref 13.0–17.1)
LYMPHS ABS: 1.2 10*3/uL (ref 0.9–3.3)
Lymphocytes Relative: 32 %
MCH: 34.5 pg — AB (ref 27.2–33.4)
MCHC: 34.1 g/dL (ref 32.0–36.0)
MCV: 101.2 fL — ABNORMAL HIGH (ref 79.3–98.0)
MONOS PCT: 9 %
Monocytes Absolute: 0.3 10*3/uL (ref 0.1–0.9)
NEUTROS PCT: 54 %
Neutro Abs: 2.1 10*3/uL (ref 1.5–6.5)
PLATELETS: 80 10*3/uL — AB (ref 140–400)
RBC: 3.8 MIL/uL — ABNORMAL LOW (ref 4.20–5.82)
RDW: 13.1 % (ref 11.0–15.6)
WBC: 3.9 10*3/uL — ABNORMAL LOW (ref 4.0–10.3)

## 2017-07-20 NOTE — Progress Notes (Signed)
[facility] [phone]   [phone]  OFFICE PROGRESS NOTE  Lillard Anes, MD [place] [address]  DIAGNOSIS: Idiopathic thrombocytopenic purpura   PRIOR THERAPY: None   CURRENT THERAPY: Observation.  INTERVAL HISTORY: [name] 77 y.o. male returns to the clinic today for annual follow-up visit accompanied by his wife.  The patient is feeling fine today with no specific complaints.  He denied having any bleeding, bruises or ecchymosis.  He denied having any recent weight loss or night sweats.  He has no nausea, vomiting, diarrhea or constipation.  He denied having any chest pain, shortness breath, cough or hemoptysis.  He is here today for evaluation with repeat blood work.   MEDICAL HISTORY: Past Medical History:  Diagnosis Date  . CAD (coronary artery disease)    Hx of   . Diabetes mellitus   . Diverticulosis of colon (without mention of hemorrhage) 2011   Colonoscopy   . Hiatal hernia 2011   EGD  . Hypertension   . ITP (idiopathic thrombocytopenic purpura)    He has been diagnosed with  . Pancytopenia     ALLERGIES:  is allergic to lipitor [atorvastatin calcium]; crestor [rosuvastatin calcium]; and zocor [simvastatin].  MEDICATIONS:  Current Outpatient Medications  Medication Sig Dispense Refill  . clopidogrel (PLAVIX) 75 MG tablet Take 1 tablet (75 mg total) by mouth daily. 90 tablet 2  . GLIPIZIDE XL 5 MG 24 hr tablet Take 5 mg by mouth 3 (three) times daily.    . isosorbide mononitrate (IMDUR) 30 MG 24 hr tablet Take 1 tablet (30 mg total) by mouth daily. 90 tablet 2  . lisinopril (PRINIVIL,ZESTRIL) 5 MG tablet Take 5 mg by mouth daily.    . metFORMIN (GLUCOPHAGE) 500 MG tablet Take 500 mg by mouth 3 (three) times daily.     . metoprolol tartrate (LOPRESSOR) 25 MG tablet Take 12.5 mg by mouth daily.    . Multiple Vitamin (MULTIVITAMIN) tablet Take 1 tablet by mouth daily.      . niacin 500 MG  tablet Take 500 mg by mouth 3 (three) times daily with meals.      . nitroGLYCERIN (NITROSTAT) 0.4 MG SL tablet Place 0.4 mg under the tongue every 5 (five) minutes as needed.       No current facility-administered medications for this visit.     SURGICAL HISTORY:  Past Surgical History:  Procedure Laterality Date  . CARDIAC CATHETERIZATION     His last heart catheterization in May of 2009 reveals a patent LIMA  . CORONARY ANGIOPLASTY     Successful percutaneous transluminal coronary angioplasty of the left circumflex obuse marginal vessel  . CORONARY ANGIOPLASTY WITH STENT PLACEMENT    . CORONARY ARTERY BYPASS GRAFT     x4 with a left internal mammary artery anastomosis to the left anterior descending coronary artery   . CORONARY STENT PLACEMENT     successful percutaneous transluminal coronary angioplasty and stenting of the left main coronary artery  . HERNIA REPAIR    . SAPHENOUS VEIN GRAFT RESECTION     graft to the first obtuse marginal, a saphenous vein graft to the first diagonal coronary artery  and a saphenous vein graft to the distal right coronary artery  . US ECHOCARDIOGRAPHY  02-08-2007   Est. EF 40-45%    REVIEW OF SYSTEMS:  A comprehensive review of systems was negative.   PHYSICAL EXAMINATION: General appearance: alert, cooperative and no  distress Head: Normocephalic, without obvious abnormality, atraumatic Neck: no adenopathy Lymph nodes: Cervical, supraclavicular, and axillary nodes normal. Resp: clear to auscultation bilaterally Back: symmetric, no curvature. ROM normal. No CVA tenderness. Cardio: regular rate and rhythm, S1, S2 normal, no murmur, click, rub or gallop GI: soft, non-tender; bowel sounds normal; no masses,  no organomegaly Extremities: extremities normal, atraumatic, no cyanosis or edema  ECOG PERFORMANCE STATUS: 0 - Asymptomatic  Blood pressure (!) 123/53, pulse 63, temperature 98.1 F (36.7 C), temperature source Oral, resp. rate 18, height  5\' 10"  (1.778 m), weight 176 lb 12.8 oz (80.2 kg), SpO2 100 %.  LABORATORY DATA: Lab Results  Component Value Date   WBC 3.9 (L) 07/20/2017   HGB 13.1 07/20/2017   HCT 38.4 07/20/2017   MCV 101.2 (H) 07/20/2017   PLT 80 (L) 07/20/2017      Chemistry      Component Value Date/Time   NA 135 01/28/2017 0932   NA 135 (L) 07/14/2016 0935   K 4.1 01/28/2017 0932   K 4.4 07/14/2016 0935   CL 99 01/28/2017 0932   CL 106 07/19/2012 1028   CO2 23 01/28/2017 0932   CO2 24 07/14/2016 0935   BUN 12 01/28/2017 0932   BUN 10.4 07/14/2016 0935   CREATININE 0.75 (L) 01/28/2017 0932   CREATININE 0.8 07/14/2016 0935      Component Value Date/Time   CALCIUM 9.0 01/28/2017 0932   CALCIUM 9.0 07/14/2016 0935   ALKPHOS 97 01/28/2017 0932   ALKPHOS 60 07/14/2016 0935   AST 46 (H) 01/28/2017 0932   AST 31 07/14/2016 0935   ALT 38 01/28/2017 0932   ALT 33 07/14/2016 0935   BILITOT 1.1 01/28/2017 0932   BILITOT 1.67 (H) 07/14/2016 0935       RADIOGRAPHIC STUDIES: No results found.  ASSESSMENT AND PLAN:  This is a very pleasant 77 years old white male with idiopathic thrombocytopenic purpura. The patient has been doing very well with no concerning complaints. CBC today showed platelets count of 80,000.  The patient is asymptomatic. I recommended for him to continue on observation with repeat CBC and LDH in 1 year. He was advised to call immediately if he has any concerning symptoms in the interval. All questions were answered. The patient knows to call the clinic with any problems, questions or concerns. We can certainly see the patient much sooner if necessary. I spent 10 minutes counseling the patient face to face. The total time spent in the appointment was 15 minutes. Disclaimer: This note was dictated with voice recognition software. Similar sounding words can inadvertently be transcribed and may not be corrected upon review.

## 2017-07-20 NOTE — Telephone Encounter (Signed)
Scheduled appt per 1/8 los - Gave patient AVS and calender per los. Lab and f/u in one year.

## 2017-07-21 ENCOUNTER — Other Ambulatory Visit: Payer: Medicare HMO

## 2017-07-21 ENCOUNTER — Ambulatory Visit: Payer: Medicare HMO | Admitting: Internal Medicine

## 2017-07-21 DIAGNOSIS — R69 Illness, unspecified: Secondary | ICD-10-CM | POA: Diagnosis not present

## 2017-07-27 DIAGNOSIS — Z823 Family history of stroke: Secondary | ICD-10-CM | POA: Diagnosis not present

## 2017-07-27 DIAGNOSIS — E1151 Type 2 diabetes mellitus with diabetic peripheral angiopathy without gangrene: Secondary | ICD-10-CM | POA: Diagnosis not present

## 2017-07-27 DIAGNOSIS — E11630 Type 2 diabetes mellitus with periodontal disease: Secondary | ICD-10-CM | POA: Diagnosis not present

## 2017-07-27 DIAGNOSIS — E1142 Type 2 diabetes mellitus with diabetic polyneuropathy: Secondary | ICD-10-CM | POA: Diagnosis not present

## 2017-07-27 DIAGNOSIS — I25119 Atherosclerotic heart disease of native coronary artery with unspecified angina pectoris: Secondary | ICD-10-CM | POA: Diagnosis not present

## 2017-07-27 DIAGNOSIS — E7800 Pure hypercholesterolemia, unspecified: Secondary | ICD-10-CM | POA: Diagnosis not present

## 2017-07-27 DIAGNOSIS — Z7984 Long term (current) use of oral hypoglycemic drugs: Secondary | ICD-10-CM | POA: Diagnosis not present

## 2017-07-27 DIAGNOSIS — E1165 Type 2 diabetes mellitus with hyperglycemia: Secondary | ICD-10-CM | POA: Diagnosis not present

## 2017-07-27 DIAGNOSIS — K08409 Partial loss of teeth, unspecified cause, unspecified class: Secondary | ICD-10-CM | POA: Diagnosis not present

## 2017-07-27 DIAGNOSIS — I10 Essential (primary) hypertension: Secondary | ICD-10-CM | POA: Diagnosis not present

## 2017-08-20 ENCOUNTER — Ambulatory Visit: Payer: Medicare HMO | Admitting: Cardiovascular Disease

## 2017-08-20 ENCOUNTER — Encounter: Payer: Self-pay | Admitting: Cardiovascular Disease

## 2017-08-20 VITALS — BP 118/54 | HR 60 | Ht 70.0 in | Wt 173.0 lb

## 2017-08-20 DIAGNOSIS — E782 Mixed hyperlipidemia: Secondary | ICD-10-CM | POA: Diagnosis not present

## 2017-08-20 DIAGNOSIS — I2510 Atherosclerotic heart disease of native coronary artery without angina pectoris: Secondary | ICD-10-CM

## 2017-08-20 LAB — BASIC METABOLIC PANEL
BUN/Creatinine Ratio: 13 (ref 10–24)
BUN: 10 mg/dL (ref 8–27)
CALCIUM: 8.9 mg/dL (ref 8.6–10.2)
CHLORIDE: 101 mmol/L (ref 96–106)
CO2: 24 mmol/L (ref 20–29)
Creatinine, Ser: 0.77 mg/dL (ref 0.76–1.27)
GFR calc Af Amer: 100 mL/min/{1.73_m2} (ref >59)
GFR, EST NON AFRICAN AMERICAN: 87 mL/min/{1.73_m2} (ref >59)
Glucose: 254 mg/dL — ABNORMAL HIGH (ref 65–99)
POTASSIUM: 4.2 mmol/L (ref 3.5–5.2)
Sodium: 137 mmol/L (ref 134–144)

## 2017-08-20 LAB — LIPID PANEL
CHOL/HDL RATIO: 2 ratio (ref 0.0–5.0)
CHOLESTEROL TOTAL: 149 mg/dL (ref 100–199)
HDL: 74 mg/dL (ref >39)
LDL CALC: 64 mg/dL (ref 0–99)
Triglycerides: 57 mg/dL (ref 0–149)
VLDL CHOLESTEROL CAL: 11 mg/dL (ref 5–40)

## 2017-08-20 LAB — HEPATIC FUNCTION PANEL
ALBUMIN: 3.5 g/dL (ref 3.5–4.8)
ALT: 36 IU/L (ref 0–44)
AST: 38 IU/L (ref 0–40)
Alkaline Phosphatase: 75 IU/L (ref 39–117)
Bilirubin Total: 1 mg/dL (ref 0.0–1.2)
Bilirubin, Direct: 0.38 mg/dL (ref 0.00–0.40)
Total Protein: 6.3 g/dL (ref 6.0–8.5)

## 2017-08-20 NOTE — Progress Notes (Signed)
Cardiology Office Note   Date:  08/20/2017   ID:  [name], DOB [birth year], MRN [id number]  PCP:  Lillard Anes, MD  Cardiologist:   Mertie Moores, MD   Chief Complaint  Patient presents with  . Coronary Artery Disease   1. Coronary artery disease-status post MI and eventually s/p CABG  ( 2003) and also stenting ( 2007) 2. Hyperlipidemia-he has been generally intolerant of statins-Crestor causes muscle pain, Lipitor causes memory loss, Zocor causes muscle pain. 2. Diabetes mellitus 3.  Chronic diastolic CHF:   [name] is an 77 year old gentleman with a history of coronary artery disease. He status post coronary artery bypass grafting. He is also status post PTCA and stenting. He also has a history of hyperlipidemia and diabetes mellitus.   He is exercising regularly . He does lots of yard work. He enjoys going to his beach house.  January 05, 2013:  [name] is doing well. His garden is doing well. Occasional CP and dyspnea. Pains last only for a few seconds. Does not have to take a NTG. Mows the lawn and does yard work without problems.   Dec. 16, 2014:  [name] is doing well. Doing yard work without problems. Occasional episodes  December 12, 2013: [name] is doing ok. Rare CP . Frequent head aches  Dec. 1 , 2015:  No cardiac complaints.  Aches all over. "staggers " when he walks or stands. No angina. He notices That he has occasional episodes of dyspnea.    December 13, 2014:  [name] is a 77 y.o. male who presents for his  CAD. Very rare CP.  Has some DOE climbing up his hill at his house.  Nov. 29, 2016:  [name] continues to have some chest pain .    Seems to start in his lower abdomen and moves up. Will last for several days.  NTG works at times and at other times, has no effect.  Exercising some.   Fatigues easily .  Has leg pain with walking .   Thinks the diabetes has a lot to do with his fatigue  . Glucose levels are ok  Goes to his beach house in [place]   Nov 12, 2015:  Doing well from a cardiac standpoint. Got dizzy - does not know if he had a syncopal episode. Had not been out working much. He thinks he may have tripped on the step   Oct. 30 , 2017:  Doing well. No CP or dyspnea  Has had some sinus issues,  [facility] to [facility] ,   Hx of CAD - intolerant to statins  - lots of muscle aches   January 28, 2017:  [name] is seen today . Seems to be stable.   Still has a little CP with walking up hills but this seems to be stable .  Does not tolerate statins Is on Niacin,   Last lipids level looks good.   Feb. 8, 2019  [name] is seen back today  No CP , Has some knots on his legs.       Past Medical History:  Diagnosis Date  . CAD (coronary artery disease)    Hx of   . Diabetes mellitus   . Diverticulosis of colon (without mention of hemorrhage) 2011   Colonoscopy   . Hiatal hernia 2011   EGD  . Hypertension   . ITP (idiopathic thrombocytopenic purpura)    He has been diagnosed with  .  Pancytopenia     Past Surgical History:  Procedure Laterality Date  . CARDIAC CATHETERIZATION     His last heart catheterization in May of 2009 reveals a patent LIMA  . CORONARY ANGIOPLASTY     Successful percutaneous transluminal coronary angioplasty of the left circumflex obuse marginal vessel  . CORONARY ANGIOPLASTY WITH STENT PLACEMENT    . CORONARY ARTERY BYPASS GRAFT     x4 with a left internal mammary artery anastomosis to the left anterior descending coronary artery   . CORONARY STENT PLACEMENT     successful percutaneous transluminal coronary angioplasty and stenting of the left main coronary artery  . HERNIA REPAIR    . SAPHENOUS VEIN GRAFT RESECTION     graft to the first obtuse marginal, a saphenous vein graft to the first diagonal coronary artery  and a saphenous vein graft to the distal right coronary artery  . US ECHOCARDIOGRAPHY  02-08-2007    Est. EF 40-45%     Current Outpatient Medications  Medication Sig Dispense Refill  . clopidogrel (PLAVIX) 75 MG tablet Take 1 tablet (75 mg total) by mouth daily. 90 tablet 2  . glipiZIDE (GLUCOTROL XL) 5 MG 24 hr tablet Take 5 mg by mouth daily with breakfast.    . isosorbide mononitrate (IMDUR) 30 MG 24 hr tablet Take 1 tablet (30 mg total) by mouth daily. 90 tablet 2  . lisinopril (PRINIVIL,ZESTRIL) 5 MG tablet Take 5 mg by mouth daily.    . metFORMIN (GLUCOPHAGE) 500 MG tablet Take 500 mg by mouth 3 (three) times daily.     . metoprolol tartrate (LOPRESSOR) 25 MG tablet Take 12.5 mg by mouth daily.    . Multiple Vitamin (MULTIVITAMIN) tablet Take 1 tablet by mouth daily.      . niacin 500 MG tablet Take 500 mg by mouth 3 (three) times daily with meals.      . nitroGLYCERIN (NITROSTAT) 0.4 MG SL tablet Place 0.4 mg under the tongue every 5 (five) minutes as needed.       No current facility-administered medications for this visit.     Allergies:   Lipitor [atorvastatin calcium]; Crestor [rosuvastatin calcium]; and Zocor [simvastatin]    Social History:  The patient  reports that  has never smoked. he has never used smokeless tobacco. He reports that he does not drink alcohol or use drugs.   Family History:  The patient's family history is not on file.   ROS: Noted in current history, all other systems are negative.Marland Kitchen   Physical Exam: Blood pressure (!) 118/54, pulse 60, height 5\' 10"  (1.778 m), weight 173 lb (78.5 kg), SpO2 97 %.  GEN:  Well nourished, well developed in no acute distress HEENT: Normal NECK: No JVD; No carotid bruits LYMPHATICS: No lymphadenopathy CARDIAC: RR, no murmurs, rubs, gallops RESPIRATORY:  Clear to auscultation without rales, wheezing or rhonchi  ABDOMEN: Soft, non-tender, non-distended MUSCULOSKELETAL:  No edema; No deformity  SKIN: Warm and dry NEUROLOGIC:  Alert and oriented x 3  EKG:     Recent Labs: 01/28/2017: ALT 38; BUN 12; Creatinine,  Ser 0.75; Potassium 4.1; Sodium 135 07/20/2017: Hemoglobin 13.1; Platelets 80   Lipid Panel    Component Value Date/Time   CHOL 152 01/28/2017 0932   TRIG 81 01/28/2017 0932   HDL 74 01/28/2017 0932   CHOLHDL 2.1 01/28/2017 0932   CHOLHDL 2.7 05/11/2016 1002   VLDL 18 05/11/2016 1002   LDLCALC 62 01/28/2017 0932      Wt Readings  from Last 3 Encounters:  08/20/17 173 lb (78.5 kg)  07/20/17 176 lb 12.8 oz (80.2 kg)  01/28/17 171 lb 12.8 oz (77.9 kg)      Other studies Reviewed: Additional studies/ records that were reviewed today include: . Review of the above records demonstrates:    ASSESSMENT AND PLAN:  1. Coronary artery disease-  No angina.   Seems to be doing well    2. Hyperlipidemia-he is unable to take statins.  He is been on niacin which seems to be working fairly well.  We discussed the PC SK 9 inhibitors. Will get fasting labs today   3. Diabetes mellitus -managed by his primary doctor.  4. Bradycardia:     Heart rate is stable.  Current medicines are reviewed at length with the patient today.  The patient does not have concerns regarding medicines.  The following changes have been made:  no change  Labs/ tests ordered today include:   No orders of the defined types were placed in this encounter.    Disposition:   FU with me in 6 months with fasting labs.       Mertie Moores, MD  08/20/2017 8:39 AM    [place], [place], [place]  [address] Phone: [phone]; Fax: [phone]   [facility]  [address] [place], [place]  [address] [phone]    Fax [phone]

## 2017-08-20 NOTE — Patient Instructions (Signed)

## 2017-10-07 DIAGNOSIS — R69 Illness, unspecified: Secondary | ICD-10-CM | POA: Diagnosis not present

## 2017-11-16 DIAGNOSIS — I2510 Atherosclerotic heart disease of native coronary artery without angina pectoris: Secondary | ICD-10-CM | POA: Diagnosis not present

## 2017-11-16 DIAGNOSIS — E782 Mixed hyperlipidemia: Secondary | ICD-10-CM | POA: Diagnosis not present

## 2017-11-16 DIAGNOSIS — I10 Essential (primary) hypertension: Secondary | ICD-10-CM | POA: Diagnosis not present

## 2017-11-16 DIAGNOSIS — E1149 Type 2 diabetes mellitus with other diabetic neurological complication: Secondary | ICD-10-CM | POA: Diagnosis not present

## 2017-11-16 DIAGNOSIS — N401 Enlarged prostate with lower urinary tract symptoms: Secondary | ICD-10-CM | POA: Diagnosis not present

## 2017-11-16 DIAGNOSIS — E1151 Type 2 diabetes mellitus with diabetic peripheral angiopathy without gangrene: Secondary | ICD-10-CM | POA: Diagnosis not present

## 2017-11-16 DIAGNOSIS — Z6824 Body mass index (BMI) 24.0-24.9, adult: Secondary | ICD-10-CM | POA: Diagnosis not present

## 2017-12-22 DIAGNOSIS — R69 Illness, unspecified: Secondary | ICD-10-CM | POA: Diagnosis not present

## 2018-01-28 ENCOUNTER — Other Ambulatory Visit: Payer: Self-pay | Admitting: Cardiovascular Disease

## 2018-01-31 DIAGNOSIS — R69 Illness, unspecified: Secondary | ICD-10-CM | POA: Diagnosis not present

## 2018-03-17 ENCOUNTER — Ambulatory Visit: Payer: Medicare HMO | Admitting: Cardiovascular Disease

## 2018-03-21 DIAGNOSIS — E782 Mixed hyperlipidemia: Secondary | ICD-10-CM | POA: Diagnosis not present

## 2018-03-21 DIAGNOSIS — I10 Essential (primary) hypertension: Secondary | ICD-10-CM | POA: Diagnosis not present

## 2018-03-21 DIAGNOSIS — I2510 Atherosclerotic heart disease of native coronary artery without angina pectoris: Secondary | ICD-10-CM | POA: Diagnosis not present

## 2018-03-21 DIAGNOSIS — N401 Enlarged prostate with lower urinary tract symptoms: Secondary | ICD-10-CM | POA: Diagnosis not present

## 2018-03-21 DIAGNOSIS — E1149 Type 2 diabetes mellitus with other diabetic neurological complication: Secondary | ICD-10-CM | POA: Diagnosis not present

## 2018-03-21 DIAGNOSIS — E1151 Type 2 diabetes mellitus with diabetic peripheral angiopathy without gangrene: Secondary | ICD-10-CM | POA: Diagnosis not present

## 2018-03-21 DIAGNOSIS — J410 Simple chronic bronchitis: Secondary | ICD-10-CM | POA: Diagnosis not present

## 2018-03-22 DIAGNOSIS — Z23 Encounter for immunization: Secondary | ICD-10-CM | POA: Diagnosis not present

## 2018-03-31 DIAGNOSIS — R69 Illness, unspecified: Secondary | ICD-10-CM | POA: Diagnosis not present

## 2018-04-14 DIAGNOSIS — H524 Presbyopia: Secondary | ICD-10-CM | POA: Diagnosis not present

## 2018-04-14 DIAGNOSIS — E119 Type 2 diabetes mellitus without complications: Secondary | ICD-10-CM | POA: Diagnosis not present

## 2018-04-14 DIAGNOSIS — H40023 Open angle with borderline findings, high risk, bilateral: Secondary | ICD-10-CM | POA: Diagnosis not present

## 2018-04-14 DIAGNOSIS — Z7984 Long term (current) use of oral hypoglycemic drugs: Secondary | ICD-10-CM | POA: Diagnosis not present

## 2018-04-14 DIAGNOSIS — H52203 Unspecified astigmatism, bilateral: Secondary | ICD-10-CM | POA: Diagnosis not present

## 2018-04-14 DIAGNOSIS — H5203 Hypermetropia, bilateral: Secondary | ICD-10-CM | POA: Diagnosis not present

## 2018-04-14 DIAGNOSIS — H04123 Dry eye syndrome of bilateral lacrimal glands: Secondary | ICD-10-CM | POA: Diagnosis not present

## 2018-04-14 DIAGNOSIS — Z961 Presence of intraocular lens: Secondary | ICD-10-CM | POA: Diagnosis not present

## 2018-05-03 ENCOUNTER — Encounter: Payer: Self-pay | Admitting: Cardiovascular Disease

## 2018-05-03 ENCOUNTER — Ambulatory Visit: Payer: Medicare HMO | Admitting: Cardiovascular Disease

## 2018-05-03 VITALS — BP 132/68 | HR 61 | Ht 70.0 in | Wt 169.1 lb

## 2018-05-03 DIAGNOSIS — E782 Mixed hyperlipidemia: Secondary | ICD-10-CM | POA: Diagnosis not present

## 2018-05-03 DIAGNOSIS — Z79899 Other long term (current) drug therapy: Secondary | ICD-10-CM

## 2018-05-03 DIAGNOSIS — I10 Essential (primary) hypertension: Secondary | ICD-10-CM

## 2018-05-03 DIAGNOSIS — I2510 Atherosclerotic heart disease of native coronary artery without angina pectoris: Secondary | ICD-10-CM

## 2018-05-03 NOTE — Patient Instructions (Addendum)
Medication Instructions:  The current medical regimen is effective;  continue present plan and medications.  If you need a refill on your cardiac medications before your next appointment, please call your pharmacy.   Lab work: Please have fasting lab work in 6 months. (Lipid, hepatic and BMP)  Follow-Up: At [facility], you and your health needs are our priority.  As part of our continuing mission to provide you with exceptional heart care, we have created designated Provider Care Teams.  These Care Teams include your primary Cardiologist (physician) and Advanced Practice Providers (APPs -  Physician Assistants and Nurse Practitioners) who all work together to provide you with the care you need, when you need it. You will need a follow up appointment in:  6 months.  Please call our office 2 months in advance to schedule this appointment.  You may see Mertie Moores, MD or one of the following Advanced Practice Providers on your designated Care Team: Richardson Dopp, PA-C [place], [place] . Daune Perch, NP  Thank you for choosing [facility]!!

## 2018-05-03 NOTE — Progress Notes (Signed)
Cardiology Office Note   Date:  05/03/2018   ID:  [name], DOB Jun 12, 1941, MRN [id number]  PCP:  Lillard Anes, MD  Cardiologist:   Mertie Moores, MD   Chief Complaint  Patient presents with  . Coronary Artery Disease  . Hyperlipidemia   1. Coronary artery disease-status post MI and eventually s/p CABG  ( 2003) and also stenting ( 2007) 2. Hyperlipidemia-he has been generally intolerant of statins-Crestor causes muscle pain, Lipitor causes memory loss, Zocor causes muscle pain. 2. Diabetes mellitus 3.  Chronic diastolic CHF:   [name] is an 77 year old gentleman with a history of coronary artery disease. He status post coronary artery bypass grafting. He is also status post PTCA and stenting. He also has a history of hyperlipidemia and diabetes mellitus.   He is exercising regularly . He does lots of yard work. He enjoys going to his beach house.  January 05, 2013:  [name] is doing well. His garden is doing well. Occasional CP and dyspnea. Pains last only for a few seconds. Does not have to take a NTG. Mows the lawn and does yard work without problems.   Dec. 16, 2014:  [name] is doing well. Doing yard work without problems. Occasional episodes  December 12, 2013: [name] is doing ok. Rare CP . Frequent head aches  Dec. 1 , 2015:  No cardiac complaints.  Aches all over. "staggers " when he walks or stands. No angina. He notices That he has occasional episodes of dyspnea.    December 13, 2014:  [name] is a 77 y.o. male who presents for his  CAD. Very rare CP.  Has some DOE climbing up his hill at his house.  Nov. 29, 2016:  [name] continues to have some chest pain .    Seems to start in his lower abdomen and moves up. Will last for several days.  NTG works at times and at other times, has no effect.  Exercising some.   Fatigues easily .  Has leg pain with walking .   Thinks the diabetes has a lot to do  with his fatigue . Glucose levels are ok  Goes to his beach house in [place]   Nov 12, 2015:  Doing well from a cardiac standpoint. Got dizzy - does not know if he had a syncopal episode. Had not been out working much. He thinks he may have tripped on the step   Oct. 30 , 2017:  Doing well. No CP or dyspnea  Has had some sinus issues,  Went to [facility] ,   Hx of CAD - intolerant to statins  - lots of muscle aches   January 28, 2017:  [name] is seen today . Seems to be stable.   Still has a little CP with walking up hills but this seems to be stable .  Does not tolerate statins Is on Niacin,   Last lipids level looks good.   Feb. 8, 2019  [name] is seen back today  No CP , Has some knots on his legs.     Oct. 22, 2019: Seen wife wife today --  No CP or dyspnea. Has episodes of hypoglycemia and has subsequent dizziness.  Still moderately active.    Has some DOE working outside.  Legs and feet hurt him frequently  - has arthritis     Past Medical History:  Diagnosis Date  . CAD (coronary artery disease)  Hx of   . Diabetes mellitus   . Diverticulosis of colon (without mention of hemorrhage) 2011   Colonoscopy   . Hiatal hernia 2011   EGD  . Hypertension   . ITP (idiopathic thrombocytopenic purpura)    He has been diagnosed with  . Pancytopenia     Past Surgical History:  Procedure Laterality Date  . CARDIAC CATHETERIZATION     His last heart catheterization in May of 2009 reveals a patent LIMA  . CORONARY ANGIOPLASTY     Successful percutaneous transluminal coronary angioplasty of the left circumflex obuse marginal vessel  . CORONARY ANGIOPLASTY WITH STENT PLACEMENT    . CORONARY ARTERY BYPASS GRAFT     x4 with a left internal mammary artery anastomosis to the left anterior descending coronary artery   . CORONARY STENT PLACEMENT     successful percutaneous transluminal coronary angioplasty and stenting of the left main coronary artery  .  HERNIA REPAIR    . SAPHENOUS VEIN GRAFT RESECTION     graft to the first obtuse marginal, a saphenous vein graft to the first diagonal coronary artery  and a saphenous vein graft to the distal right coronary artery  . US ECHOCARDIOGRAPHY  02-08-2007   Est. EF 40-45%     Current Outpatient Medications  Medication Sig Dispense Refill  . clopidogrel (PLAVIX) 75 MG tablet TAKE 1 TABLET ONCE DAILY. 90 tablet 2  . glipiZIDE (GLUCOTROL XL) 5 MG 24 hr tablet Take 5 mg by mouth daily with breakfast.    . isosorbide mononitrate (IMDUR) 30 MG 24 hr tablet TAKE 1 TABLET ONCE DAILY. 90 tablet 2  . lisinopril (PRINIVIL,ZESTRIL) 5 MG tablet Take 5 mg by mouth daily.    . metFORMIN (GLUCOPHAGE) 500 MG tablet Take 500 mg by mouth 3 (three) times daily.     . metoprolol tartrate (LOPRESSOR) 25 MG tablet Take 12.5 mg by mouth daily.    . Multiple Vitamin (MULTIVITAMIN) tablet Take 1 tablet by mouth daily.      . niacin 500 MG tablet Take 500 mg by mouth 3 (three) times daily with meals.      . nitroGLYCERIN (NITROSTAT) 0.4 MG SL tablet Place 0.4 mg under the tongue every 5 (five) minutes as needed.       No current facility-administered medications for this visit.     Allergies:   Lipitor [atorvastatin calcium]; Crestor [rosuvastatin calcium]; and Zocor [simvastatin]    Social History:  The patient  reports that he has never smoked. He has never used smokeless tobacco. He reports that he does not drink alcohol or use drugs.   Family History:  The patient's family history is not on file.   ROS: Noted in current history, all other systems are negative.Marland Kitchen   Physical Exam: Blood pressure 132/68, pulse 61, height 5\' 10"  (1.778 m), weight 169 lb 1.9 oz (76.7 kg), SpO2 99 %.  GEN:  Well nourished, well developed in no acute distress HEENT: Normal NECK: No JVD; No carotid bruits LYMPHATICS: No lymphadenopathy CARDIAC: RRR   RESPIRATORY:  Clear to auscultation without rales, wheezing or rhonchi  ABDOMEN:  Soft, non-tender, non-distended MUSCULOSKELETAL:  No edema; No deformity  SKIN: Warm and dry NEUROLOGIC:  Alert and oriented x 3   EKG:     May 03, 2018: Normal sinus rhythm at 61 beats minute.  Nonspecific T wave abnormalities.  Recent Labs: 07/20/2017: Hemoglobin 13.1; Platelets 80 08/20/2017: ALT 36; BUN 10; Creatinine, Ser 0.77; Potassium 4.2; Sodium 137  Lipid Panel    Component Value Date/Time   CHOL 149 08/20/2017 0904   TRIG 57 08/20/2017 0904   HDL 74 08/20/2017 0904   CHOLHDL 2.0 08/20/2017 0904   CHOLHDL 2.7 05/11/2016 1002   VLDL 18 05/11/2016 1002   LDLCALC 64 08/20/2017 0904      Wt Readings from Last 3 Encounters:  05/03/18 169 lb 1.9 oz (76.7 kg)  08/20/17 173 lb (78.5 kg)  07/20/17 176 lb 12.8 oz (80.2 kg)      Other studies Reviewed: Additional studies/ records that were reviewed today include: . Review of the above records demonstrates:    ASSESSMENT AND PLAN:  1. Coronary artery disease-    No CP Echo in 2015 showed normal LV function     2. Hyperlipidemia-   He has restarted the niacin.   LDL was up a bit.  Will check lipids, liver enz and BMP at his next office visit in 6 months   3. Diabetes mellitus -   4. Bradycardia:       Current medicines are reviewed at length with the patient today.  The patient does not have concerns regarding medicines.  The following changes have been made:  no change  Labs/ tests ordered today include:   No orders of the defined types were placed in this encounter.    Disposition:   FU with me in 6 months with fasting labs.       Mertie Moores, MD  05/03/2018 9:42 AM    [facility] [address], [place], [place]  [address] Phone: [phone]; Fax: [phone]

## 2018-06-27 DIAGNOSIS — R69 Illness, unspecified: Secondary | ICD-10-CM | POA: Diagnosis not present

## 2018-07-19 ENCOUNTER — Inpatient Hospital Stay: Payer: Medicare HMO

## 2018-07-19 ENCOUNTER — Telehealth: Payer: Self-pay | Admitting: Internal Medicine

## 2018-07-19 ENCOUNTER — Encounter: Payer: Self-pay | Admitting: Internal Medicine

## 2018-07-19 ENCOUNTER — Inpatient Hospital Stay: Payer: Medicare HMO | Attending: Internal Medicine | Admitting: Internal Medicine

## 2018-07-19 VITALS — BP 136/72 | HR 60 | Temp 98.6°F | Resp 20 | Ht 69.0 in | Wt 166.4 lb

## 2018-07-19 DIAGNOSIS — E119 Type 2 diabetes mellitus without complications: Secondary | ICD-10-CM | POA: Diagnosis not present

## 2018-07-19 DIAGNOSIS — I10 Essential (primary) hypertension: Secondary | ICD-10-CM | POA: Insufficient documentation

## 2018-07-19 DIAGNOSIS — I2510 Atherosclerotic heart disease of native coronary artery without angina pectoris: Secondary | ICD-10-CM

## 2018-07-19 DIAGNOSIS — D61818 Other pancytopenia: Secondary | ICD-10-CM

## 2018-07-19 DIAGNOSIS — D696 Thrombocytopenia, unspecified: Secondary | ICD-10-CM

## 2018-07-19 DIAGNOSIS — D693 Immune thrombocytopenic purpura: Secondary | ICD-10-CM

## 2018-07-19 LAB — CBC WITH DIFFERENTIAL (CANCER CENTER ONLY)
Abs Immature Granulocytes: 0.02 10*3/uL (ref 0.00–0.07)
Basophils Absolute: 0 10*3/uL (ref 0.0–0.1)
Basophils Relative: 0 %
EOS ABS: 0.3 10*3/uL (ref 0.0–0.5)
EOS PCT: 6 %
HEMATOCRIT: 40.5 % (ref 39.0–52.0)
Hemoglobin: 14.2 g/dL (ref 13.0–17.0)
IMMATURE GRANULOCYTES: 0 %
LYMPHS ABS: 1.2 10*3/uL (ref 0.7–4.0)
Lymphocytes Relative: 23 %
MCH: 33.7 pg (ref 26.0–34.0)
MCHC: 35.1 g/dL (ref 30.0–36.0)
MCV: 96.2 fL (ref 80.0–100.0)
MONOS PCT: 9 %
Monocytes Absolute: 0.5 10*3/uL (ref 0.1–1.0)
Neutro Abs: 3.2 10*3/uL (ref 1.7–7.7)
Neutrophils Relative %: 62 %
Platelet Count: 92 10*3/uL — ABNORMAL LOW (ref 150–400)
RBC: 4.21 MIL/uL — ABNORMAL LOW (ref 4.22–5.81)
RDW: 12.6 % (ref 11.5–15.5)
WBC Count: 5.2 10*3/uL (ref 4.0–10.5)
nRBC: 0 % (ref 0.0–0.2)

## 2018-07-19 LAB — LACTATE DEHYDROGENASE: LDH: 208 U/L — ABNORMAL HIGH (ref 98–192)

## 2018-07-19 NOTE — Telephone Encounter (Signed)
Printed calendar and avs. °

## 2018-07-19 NOTE — Progress Notes (Signed)
[place] [phone]   [phone]  OFFICE PROGRESS NOTE  Lillard Anes, MD [place] [address]  DIAGNOSIS: Idiopathic thrombocytopenic purpura   PRIOR THERAPY: None   CURRENT THERAPY: Observation.  INTERVAL HISTORY: [name] 78 y.o. male returns to the clinic today for follow-up visit accompanied by his wife.  The patient is feeling fine today with no concerning complaints.  He denied having any chest pain, shortness of breath, cough or hemoptysis.  He denied having any fever or chills.  He has no nausea, vomiting, diarrhea or constipation.  He denied having any bleeding, bruises or ecchymosis.  The patient is here today for evaluation and repeat blood work.  MEDICAL HISTORY: Past Medical History:  Diagnosis Date  . CAD (coronary artery disease)    Hx of   . Diabetes mellitus   . Diverticulosis of colon (without mention of hemorrhage) 2011   Colonoscopy   . Hiatal hernia 2011   EGD  . Hypertension   . ITP (idiopathic thrombocytopenic purpura)    He has been diagnosed with  . Pancytopenia     ALLERGIES:  is allergic to lipitor [atorvastatin calcium]; crestor [rosuvastatin calcium]; and zocor [simvastatin].  MEDICATIONS:  Current Outpatient Medications  Medication Sig Dispense Refill  . clopidogrel (PLAVIX) 75 MG tablet TAKE 1 TABLET ONCE DAILY. 90 tablet 2  . glipiZIDE (GLUCOTROL XL) 5 MG 24 hr tablet Take 5 mg by mouth daily with breakfast.    . isosorbide mononitrate (IMDUR) 30 MG 24 hr tablet TAKE 1 TABLET ONCE DAILY. 90 tablet 2  . lisinopril (PRINIVIL,ZESTRIL) 5 MG tablet Take 5 mg by mouth daily.    . metFORMIN (GLUCOPHAGE) 500 MG tablet Take 500 mg by mouth 3 (three) times daily.     . metoprolol tartrate (LOPRESSOR) 25 MG tablet Take 12.5 mg by mouth daily.    . Multiple Vitamin (MULTIVITAMIN) tablet Take 1 tablet by mouth daily.      . niacin 500 MG tablet Take 500 mg by mouth 3 (three) times  daily with meals.      . nitroGLYCERIN (NITROSTAT) 0.4 MG SL tablet Place 0.4 mg under the tongue every 5 (five) minutes as needed.       No current facility-administered medications for this visit.     SURGICAL HISTORY:  Past Surgical History:  Procedure Laterality Date  . CARDIAC CATHETERIZATION     His last heart catheterization in May of 2009 reveals a patent LIMA  . CORONARY ANGIOPLASTY     Successful percutaneous transluminal coronary angioplasty of the left circumflex obuse marginal vessel  . CORONARY ANGIOPLASTY WITH STENT PLACEMENT    . CORONARY ARTERY BYPASS GRAFT     x4 with a left internal mammary artery anastomosis to the left anterior descending coronary artery   . CORONARY STENT PLACEMENT     successful percutaneous transluminal coronary angioplasty and stenting of the left main coronary artery  . HERNIA REPAIR    . SAPHENOUS VEIN GRAFT RESECTION     graft to the first obtuse marginal, a saphenous vein graft to the first diagonal coronary artery  and a saphenous vein graft to the distal right coronary artery  . US ECHOCARDIOGRAPHY  02-08-2007   Est. EF 40-45%    REVIEW OF SYSTEMS:  A comprehensive review of systems was negative.   PHYSICAL EXAMINATION: General appearance: alert, cooperative and no distress Head: Normocephalic, without obvious abnormality, atraumatic Neck: no adenopathy Lymph  nodes: Cervical, supraclavicular, and axillary nodes normal. Resp: clear to auscultation bilaterally Back: symmetric, no curvature. ROM normal. No CVA tenderness. Cardio: regular rate and rhythm, S1, S2 normal, no murmur, click, rub or gallop GI: soft, non-tender; bowel sounds normal; no masses,  no organomegaly Extremities: extremities normal, atraumatic, no cyanosis or edema  ECOG PERFORMANCE STATUS: 0 - Asymptomatic  Blood pressure 136/72, pulse 60, temperature 98.6 F (37 C), temperature source Oral, resp. rate 20, height 5\' 9"  (1.753 m), weight 166 lb 6.4 oz (75.5 kg),  SpO2 99 %.  LABORATORY DATA: Lab Results  Component Value Date   WBC 5.2 07/19/2018   HGB 14.2 07/19/2018   HCT 40.5 07/19/2018   MCV 96.2 07/19/2018   PLT 92 (L) 07/19/2018      Chemistry      Component Value Date/Time   NA 137 08/20/2017 0904   NA 135 (L) 07/14/2016 0935   K 4.2 08/20/2017 0904   K 4.4 07/14/2016 0935   CL 101 08/20/2017 0904   CL 106 07/19/2012 1028   CO2 24 08/20/2017 0904   CO2 24 07/14/2016 0935   BUN 10 08/20/2017 0904   BUN 10.4 07/14/2016 0935   CREATININE 0.77 08/20/2017 0904   CREATININE 0.8 07/14/2016 0935      Component Value Date/Time   CALCIUM 8.9 08/20/2017 0904   CALCIUM 9.0 07/14/2016 0935   ALKPHOS 75 08/20/2017 0904   ALKPHOS 60 07/14/2016 0935   AST 38 08/20/2017 0904   AST 31 07/14/2016 0935   ALT 36 08/20/2017 0904   ALT 33 07/14/2016 0935   BILITOT 1.0 08/20/2017 0904   BILITOT 1.67 (H) 07/14/2016 0935       RADIOGRAPHIC STUDIES: No results found.  ASSESSMENT AND PLAN:  This is a very pleasant 78 years old white male with idiopathic thrombocytopenic purpura. The patient is currently on observation and he is feeling fine with no concerning complaints.  He denied having any bleeding issues. Repeat CBC today showed persistent mild thrombocytopenia but no significant worsening from last year. I recommended for the patient to continue on observation with repeat CBC, comprehensive metabolic panel and LDH in 1 year. He was advised to call immediately if he has any concerning symptoms in the interval. All questions were answered. The patient knows to call the clinic with any problems, questions or concerns. We can certainly see the patient much sooner if necessary. I spent 10 minutes counseling the patient face to face. The total time spent in the appointment was 15 minutes. Disclaimer: This note was dictated with voice recognition software. Similar sounding words can inadvertently be transcribed and may not be corrected upon  review.

## 2018-07-20 DIAGNOSIS — D696 Thrombocytopenia, unspecified: Secondary | ICD-10-CM | POA: Diagnosis not present

## 2018-07-20 DIAGNOSIS — E1149 Type 2 diabetes mellitus with other diabetic neurological complication: Secondary | ICD-10-CM | POA: Diagnosis not present

## 2018-07-20 DIAGNOSIS — N401 Enlarged prostate with lower urinary tract symptoms: Secondary | ICD-10-CM | POA: Diagnosis not present

## 2018-07-20 DIAGNOSIS — I10 Essential (primary) hypertension: Secondary | ICD-10-CM | POA: Diagnosis not present

## 2018-07-20 DIAGNOSIS — E1151 Type 2 diabetes mellitus with diabetic peripheral angiopathy without gangrene: Secondary | ICD-10-CM | POA: Diagnosis not present

## 2018-07-20 DIAGNOSIS — E782 Mixed hyperlipidemia: Secondary | ICD-10-CM | POA: Diagnosis not present

## 2018-07-20 DIAGNOSIS — I2510 Atherosclerotic heart disease of native coronary artery without angina pectoris: Secondary | ICD-10-CM | POA: Diagnosis not present

## 2018-07-20 DIAGNOSIS — Z6824 Body mass index (BMI) 24.0-24.9, adult: Secondary | ICD-10-CM | POA: Diagnosis not present

## 2018-07-21 DIAGNOSIS — R69 Illness, unspecified: Secondary | ICD-10-CM | POA: Diagnosis not present

## 2018-09-28 DIAGNOSIS — R69 Illness, unspecified: Secondary | ICD-10-CM | POA: Diagnosis not present

## 2018-09-29 DIAGNOSIS — R69 Illness, unspecified: Secondary | ICD-10-CM | POA: Diagnosis not present

## 2018-10-31 ENCOUNTER — Telehealth: Payer: Self-pay

## 2018-10-31 NOTE — Telephone Encounter (Signed)
Pt has given consent to telephone visit with Dr. Acie Fredrickson. Pt has been instructed to have BP, HR, and weight ready for visit.     YOUR CARDIOLOGY TEAM HAS ARRANGED FOR AN E-VISIT FOR YOUR APPOINTMENT - PLEASE REVIEW IMPORTANT INFORMATION BELOW SEVERAL DAYS PRIOR TO YOUR APPOINTMENT  Due to the recent COVID-19 pandemic, we are transitioning in-person office visits to tele-medicine visits in an effort to decrease unnecessary exposure to our patients and staff. Medicare and most insurances are covering these visits without a copay needed. You will need a working email and a smartphone or computer with a camera and microphone. For patients that do not have these items, we can still complete the visit using a telephone but do prefer video when possible. If possible, we also ask that you have a blood pressure cuff and scale at home to measure your blood pressure, heart rate and weight prior to your scheduled appointment. Patients with clinical needs that need an in-person evaluation and testing will still be able to come to the office if absolutely necessary. If you have any questions, feel free to call our office.     DOWNLOADING THE SOFTWARE  Download the [employer] app to enable video and telephone visits with your [facility] Provider.   Instructions for downloading [employer] WebEx: - Go to https://www.webex.com/downloads.html and follow the instructions, or download the app on your smartphone [facility] [employer] Meetings). - If you have technical difficulties with downloading WebEx, please call WebEx at [phone]. - Once the app is downloaded (can be done on either mobile or desktop computer), go to Settings in the upper left hand corner.  Be sure that camera and audio are enabled.  - You will receive an email message with a link to the meeting with a time to join for your tele-health visit.  - Please download the app and have settings configured prior to the appointment time.      2-3 DAYS BEFORE  YOUR APPOINTMENT  One of our staff will call you to confirm that you have been able to set up your WebEx account. We will remind you check your blood pressure, heart rate and weight prior to your scheduled appointment. If you have an Apple Watch or Kardia, please upload any pertinent ECG strips the day before or morning of your appointment to [place]. Our staff will also make sure you have reviewed the consent and agree to move forward with your scheduled tele-health visit.    THE DAY OF YOUR APPOINTMENT  Approximately 15-20 minutes prior to your scheduled appointment, you will receive an e-mail directly from one of our staff member's [email or web address] e-mail accounts inviting you to join a WebEx meeting.  Please do not reply to that email - simply join the [employer].  Upon joining, a member of the office staff will speak with you initially through the WebEx platform to confirm medications, vital signs for the day and any symptoms you may be experiencing.  Please have this information available prior to the time of visit start.      CONSENT FOR TELE-HEALTH VISIT - PLEASE RVIEW  I hereby voluntarily request, consent and authorize [facility] and its employed or contracted physicians, physician assistants, nurse practitioners or other licensed health care professionals (the Practitioner), to provide me with telemedicine health care services (the "Services") as deemed necessary by the treating Practitioner. I acknowledge and consent to receive the Services by the Practitioner via telemedicine. I understand that the telemedicine visit will involve communicating with  the Practitioner through live audiovisual communication technology and the disclosure of certain medical information by electronic transmission. I acknowledge that I have been given the opportunity to request an in-person assessment or other available alternative prior to the telemedicine visit and am voluntarily participating in the  telemedicine visit.  I understand that I have the right to withhold or withdraw my consent to the use of telemedicine in the course of my care at any time, without affecting my right to future care or treatment, and that the Practitioner or I may terminate the telemedicine visit at any time. I understand that I have the right to inspect all information obtained and/or recorded in the course of the telemedicine visit and may receive copies of available information for a reasonable fee.  I understand that some of the potential risks of receiving the Services via telemedicine include:  Marland Kitchen Delay or interruption in medical evaluation due to technological equipment failure or disruption; . Information transmitted may not be sufficient (e.g. poor resolution of images) to allow for appropriate medical decision making by the Practitioner; and/or  . In rare instances, security protocols could fail, causing a breach of personal health information.  Furthermore, I acknowledge that it is my responsibility to provide information about my medical history, conditions and care that is complete and accurate to the best of my ability. I acknowledge that Practitioner's advice, recommendations, and/or decision may be based on factors not within their control, such as incomplete or inaccurate data provided by me or distortions of diagnostic images or specimens that may result from electronic transmissions. I understand that the practice of medicine is not an exact science and that Practitioner makes no warranties or guarantees regarding treatment outcomes. I acknowledge that I will receive a copy of this consent concurrently upon execution via email to the email address I last provided but may also request a printed copy by calling the office of [place].    I understand that my insurance will be billed for this visit.   I have read or had this consent read to me. . I understand the contents of this consent, which  adequately explains the benefits and risks of the Services being provided via telemedicine.  . I have been provided ample opportunity to ask questions regarding this consent and the Services and have had my questions answered to my satisfaction. . I give my informed consent for the services to be provided through the use of telemedicine in my medical care  By participating in this telemedicine visit I agree to the above.

## 2018-11-02 ENCOUNTER — Telehealth: Payer: Self-pay | Admitting: Cardiovascular Disease

## 2018-11-02 NOTE — Telephone Encounter (Signed)
Spoke with patient who confirmed all demographics. Patient's daughter, Maudie Mercury, actively uses My Chart for him.  He does not have a smart phone. Will have vitals ready for visit.

## 2018-11-08 ENCOUNTER — Other Ambulatory Visit: Payer: Self-pay

## 2018-11-08 ENCOUNTER — Encounter: Payer: Self-pay | Admitting: Cardiovascular Disease

## 2018-11-08 ENCOUNTER — Telehealth (INDEPENDENT_AMBULATORY_CARE_PROVIDER_SITE_OTHER): Payer: Medicare HMO | Admitting: Cardiovascular Disease

## 2018-11-08 VITALS — BP 131/71 | HR 69 | Ht 69.0 in | Wt 170.0 lb

## 2018-11-08 DIAGNOSIS — I2510 Atherosclerotic heart disease of native coronary artery without angina pectoris: Secondary | ICD-10-CM | POA: Diagnosis not present

## 2018-11-08 DIAGNOSIS — E782 Mixed hyperlipidemia: Secondary | ICD-10-CM

## 2018-11-08 DIAGNOSIS — Z7189 Other specified counseling: Secondary | ICD-10-CM

## 2018-11-08 MED ORDER — ISOSORBIDE MONONITRATE ER 30 MG PO TB24: 30.0000 mg | ORAL_TABLET | Freq: Every day | ORAL | 3 refills | Status: DC

## 2018-11-08 MED ORDER — NITROGLYCERIN 0.4 MG SL SUBL: 0.4000 mg | SUBLINGUAL_TABLET | SUBLINGUAL | 6 refills | Status: DC | PRN

## 2018-11-08 MED ORDER — CLOPIDOGREL BISULFATE 75 MG PO TABS: 75.0000 mg | ORAL_TABLET | Freq: Every day | ORAL | 3 refills | Status: DC

## 2018-11-08 NOTE — Patient Instructions (Signed)
Medication Instructions:  Your physician recommends that you continue on your current medications as directed. Please refer to the Current Medication list given to you today. Refills have been sent to your pharmacy   Lab work: Your physician recommends that you return for lab work in: 6 months on the day of or a few days before your office visit with Dr. Acie Fredrickson.  You will need to FAST for this appointment - nothing to eat or drink after midnight the night before except water.    Testing/Procedures: None Ordered   Follow-Up: At [facility], you and your health needs are our priority.  As part of our continuing mission to provide you with exceptional heart care, we have created designated Provider Care Teams.  These Care Teams include your primary Cardiologist (physician) and Advanced Practice Providers (APPs -  Physician Assistants and Nurse Practitioners) who all work together to provide you with the care you need, when you need it. You will need a follow up appointment in:  6 months.  Please call our office 2 months in advance to schedule this appointment.  You may see Mertie Moores, MD or one of the following Advanced Practice Providers on your designated Care Team: Richardson Dopp, PA-C [place], [place] . Daune Perch, NP

## 2018-11-08 NOTE — Progress Notes (Signed)
Virtual Visit via Telephone Note   This visit type was conducted due to national recommendations for restrictions regarding the COVID-19 Pandemic (e.g. social distancing) in an effort to limit this patient's exposure and mitigate transmission in our community.  Due to his co-morbid illnesses, this patient is at least at moderate risk for complications without adequate follow up.  This format is felt to be most appropriate for this patient at this time.  The patient did not have access to video technology/had technical difficulties with video requiring transitioning to audio format only (telephone).  All issues noted in this document were discussed and addressed.  No physical exam could be performed with this format.  Please refer to the patient's chart for his  consent to telehealth for [facility].   Evaluation Performed:  Follow-up visit  Date:  11/08/2018   ID:  [name], [name] 26-Dec-1940, MRN [id number]  Patient Location: Home Provider Location: Home  PCP:  Lillard Anes, MD  Cardiologist:  Mertie Moores, MD  Electrophysiologist:  None    1. Coronary artery disease-status post MI and eventually s/p CABG  ( 2003) and also stenting ( 2007) 2. Hyperlipidemia-he has been generally intolerant of statins-Crestor causes muscle pain, Lipitor causes memory loss, Zocor causes muscle pain. 2. Diabetes mellitus 3.  Chronic diastolic CHF:   Chief Complaint:  CHF   History of Present Illness:    [name] is a 78 y.o. male with CAD and CABG and stenting   Has occasional episodes of angina. Does not have to take NTG . Also has hyperlipidemia  Diabetes mellitus  Able to do some of his ourdoor chores. Falls easily ,   Balance issues.  Has a tremor,  Progressively getting worse.    Social distancing, wearing mask The patient does not have symptoms concerning for COVID-19 infection (fever, chills, cough, or new shortness of breath).    Past Medical History:   Diagnosis Date  . CAD (coronary artery disease)    Hx of   . Diabetes mellitus   . Diverticulosis of colon (without mention of hemorrhage) 2011   Colonoscopy   . Hiatal hernia 2011   EGD  . Hypertension   . ITP (idiopathic thrombocytopenic purpura)    He has been diagnosed with  . Pancytopenia    Past Surgical History:  Procedure Laterality Date  . CARDIAC CATHETERIZATION     His last heart catheterization in May of 2009 reveals a patent LIMA  . CORONARY ANGIOPLASTY     Successful percutaneous transluminal coronary angioplasty of the left circumflex obuse marginal vessel  . CORONARY ANGIOPLASTY WITH STENT PLACEMENT    . CORONARY ARTERY BYPASS GRAFT     x4 with a left internal mammary artery anastomosis to the left anterior descending coronary artery   . CORONARY STENT PLACEMENT     successful percutaneous transluminal coronary angioplasty and stenting of the left main coronary artery  . HERNIA REPAIR    . SAPHENOUS VEIN GRAFT RESECTION     graft to the first obtuse marginal, a saphenous vein graft to the first diagonal coronary artery  and a saphenous vein graft to the distal right coronary artery  . US ECHOCARDIOGRAPHY  02-08-2007   Est. EF 40-45%     Current Meds  Medication Sig  . clopidogrel (PLAVIX) 75 MG tablet TAKE 1 TABLET ONCE DAILY.  Marland Kitchen glipiZIDE (GLUCOTROL XL) 5 MG 24 hr tablet Take 5 mg by mouth daily with breakfast.  . isosorbide mononitrate (  IMDUR) 30 MG 24 hr tablet TAKE 1 TABLET ONCE DAILY.  Marland Kitchen lisinopril (PRINIVIL,ZESTRIL) 5 MG tablet Take 5 mg by mouth daily.  . metFORMIN (GLUCOPHAGE) 500 MG tablet Take 500 mg by mouth 3 (three) times daily.   . metoprolol tartrate (LOPRESSOR) 25 MG tablet Take 12.5 mg by mouth daily.  . Multiple Vitamin (MULTIVITAMIN) tablet Take 1 tablet by mouth daily.    . niacin 500 MG tablet Take 500 mg by mouth 2 (two) times daily with a meal.   . nitroGLYCERIN (NITROSTAT) 0.4 MG SL tablet Place 0.4 mg under the tongue every 5 (five)  minutes as needed.    . ONE TOUCH ULTRA TEST test strip      Allergies:   Lipitor [atorvastatin calcium]; Crestor [rosuvastatin calcium]; and Zocor [simvastatin]   Social History   Tobacco Use  . Smoking status: Never Smoker  . Smokeless tobacco: Never Used  Substance Use Topics  . Alcohol use: No  . Drug use: No     Family Hx: The patient's family history is negative for Colon cancer.  ROS:   Please see the history of present illness.     All other systems reviewed and are negative.   Prior CV studies:   The following studies were reviewed today:    Labs/Other Tests and Data Reviewed:    EKG:  No ECG reviewed.  Recent Labs: 07/19/2018: Hemoglobin 14.2; Platelet Count 92   Recent Lipid Panel Lab Results  Component Value Date/Time   CHOL 149 08/20/2017 09:04 AM   TRIG 57 08/20/2017 09:04 AM   HDL 74 08/20/2017 09:04 AM   CHOLHDL 2.0 08/20/2017 09:04 AM   CHOLHDL 2.7 05/11/2016 10:02 AM   LDLCALC 64 08/20/2017 09:04 AM    Wt Readings from Last 3 Encounters:  11/08/18 170 lb (77.1 kg)  07/19/18 166 lb 6.4 oz (75.5 kg)  05/03/18 169 lb 1.9 oz (76.7 kg)     Objective:    Vital Signs:  BP 131/71 (BP Location: Left Arm, Patient Position: Sitting, Cuff Size: Normal)   Pulse 69   Ht 5\' 9"  (1.753 m)   Wt 170 lb (77.1 kg)   BMI 25.10 kg/m    VITAL SIGNS:  reviewed GEN:  no acute distress EYES:  sclerae anicteric, EOMI - Extraocular Movements Intact RESPIRATORY:  normal respiratory effort, symmetric expansion CARDIOVASCULAR:  no peripheral edema SKIN:  no rash, lesions or ulcers. MUSCULOSKELETAL:  no obvious deformities. NEURO:  alert and oriented x 3, no obvious focal deficit PSYCH:  normal affect  ASSESSMENT & PLAN:    1. CAD:   Has stable angina.   Seems to be a stable pattern.   He will call if his symptoms worsen.   2. Hyperlipidemia:  Is on niacin. Is intolerant to statins.     Will see him in 6 months . Will consider repeating echo and myoview  at that time     COVID-19 Education: The signs and symptoms of COVID-19 were discussed with the patient and how to seek care for testing (follow up with PCP or arrange E-visit).  The importance of social distancing was discussed today.  Time:   Today, I have spent  15  minutes with the patient with telehealth technology discussing the above problems.     Medication Adjustments/Labs and Tests Ordered: Current medicines are reviewed at length with the patient today.  Concerns regarding medicines are outlined above.   Tests Ordered: No orders of the defined types were placed in  this encounter.   Medication Changes: No orders of the defined types were placed in this encounter.   Disposition:  Follow up in 6 month(s)  Signed, Mertie Moores, MD  11/08/2018 8:58 AM    [facility]

## 2018-11-24 DIAGNOSIS — R69 Illness, unspecified: Secondary | ICD-10-CM | POA: Diagnosis not present

## 2018-11-25 DIAGNOSIS — I119 Hypertensive heart disease without heart failure: Secondary | ICD-10-CM | POA: Diagnosis not present

## 2018-11-25 DIAGNOSIS — N401 Enlarged prostate with lower urinary tract symptoms: Secondary | ICD-10-CM | POA: Diagnosis not present

## 2018-11-25 DIAGNOSIS — J449 Chronic obstructive pulmonary disease, unspecified: Secondary | ICD-10-CM | POA: Diagnosis not present

## 2018-11-25 DIAGNOSIS — E1151 Type 2 diabetes mellitus with diabetic peripheral angiopathy without gangrene: Secondary | ICD-10-CM | POA: Diagnosis not present

## 2018-11-25 DIAGNOSIS — Z6824 Body mass index (BMI) 24.0-24.9, adult: Secondary | ICD-10-CM | POA: Diagnosis not present

## 2018-11-25 DIAGNOSIS — Z1159 Encounter for screening for other viral diseases: Secondary | ICD-10-CM | POA: Diagnosis not present

## 2018-11-25 DIAGNOSIS — I2510 Atherosclerotic heart disease of native coronary artery without angina pectoris: Secondary | ICD-10-CM | POA: Diagnosis not present

## 2018-11-25 DIAGNOSIS — E1142 Type 2 diabetes mellitus with diabetic polyneuropathy: Secondary | ICD-10-CM | POA: Diagnosis not present

## 2018-11-25 DIAGNOSIS — E782 Mixed hyperlipidemia: Secondary | ICD-10-CM | POA: Diagnosis not present

## 2018-12-17 IMAGING — CT CT CERVICAL SPINE W/O CM
3 of 4 series · 12 of 33 positions shown, 14 images · non-contrast
Comparison: None.

CLINICAL DATA: Posttraumatic headache after fall.

EXAM:
CT HEAD WITHOUT CONTRAST
CT CERVICAL SPINE WITHOUT CONTRAST
TECHNIQUE: Multidetector CT imaging of the head and cervical spine was
performed following the standard protocol without intravenous
contrast. Multiplanar CT image reconstructions of the cervical spine
were also generated.

[Series 5: c_spine 2.0 st · axial · 0.32mm/px · z∈[+1184,+1320]mm · 4 of 104 slices shown, 5 images]
[im 18/104  soft-tissue]
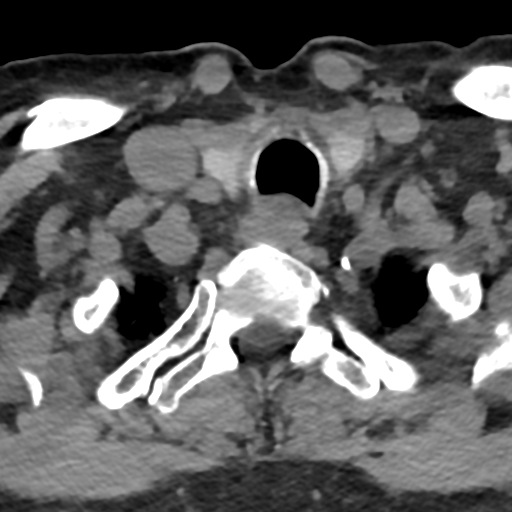
[im 18/104  bone]
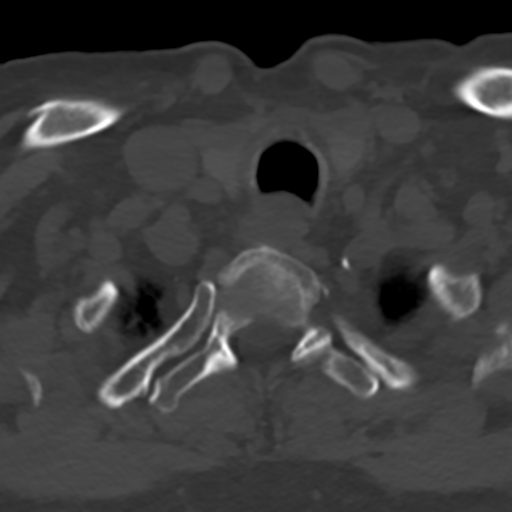
[im 35/104  bone]
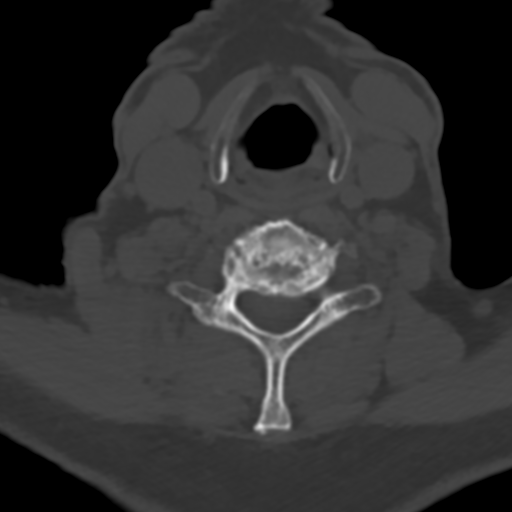
[im 69/104  bone]
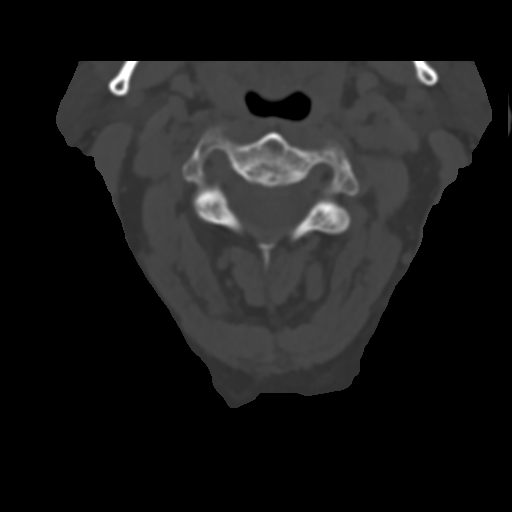
[im 86/104  bone]
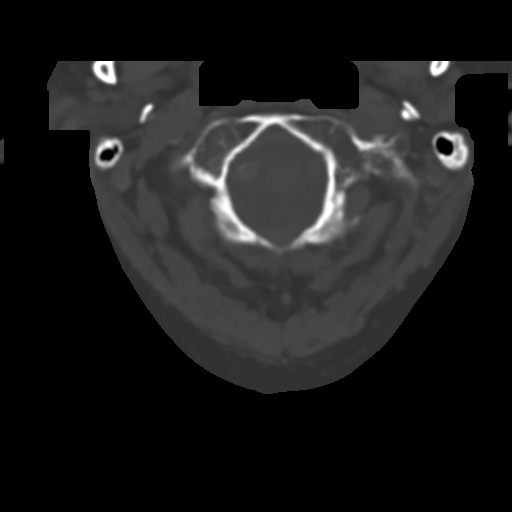

[Series 6: coronal bone · coronal · 0.24mm/px · 3 of 68 slices shown]
[im 14/68  bone]
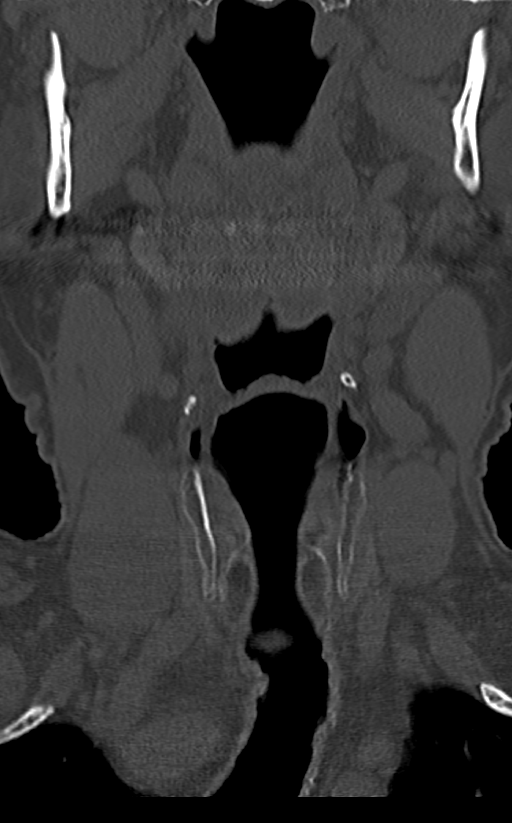
[im 27/68  bone]
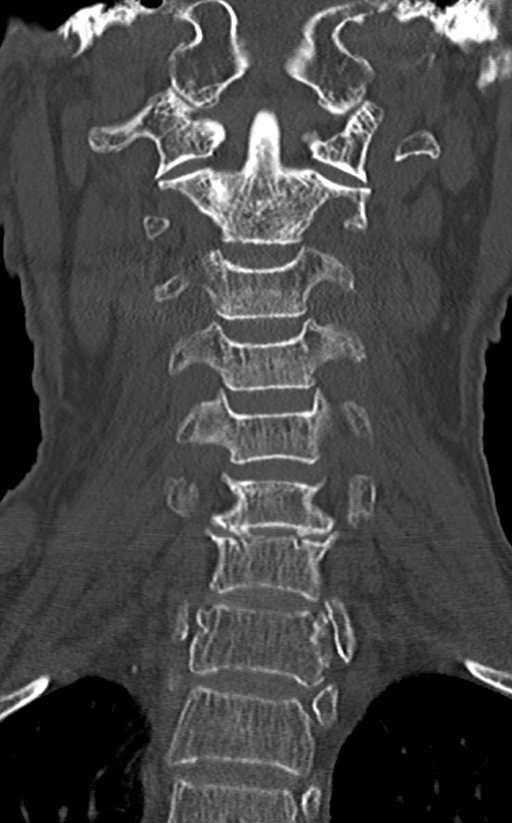
[im 41/68  bone]
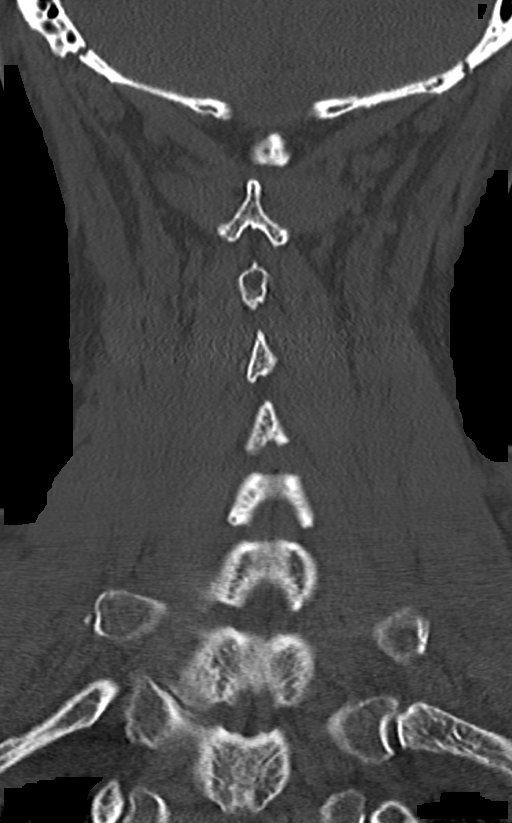

[Series 7: sagittal bone · sagittal · 0.25mm/px · 5 of 63 slices shown, 6 images]
[im 21/63  bone]
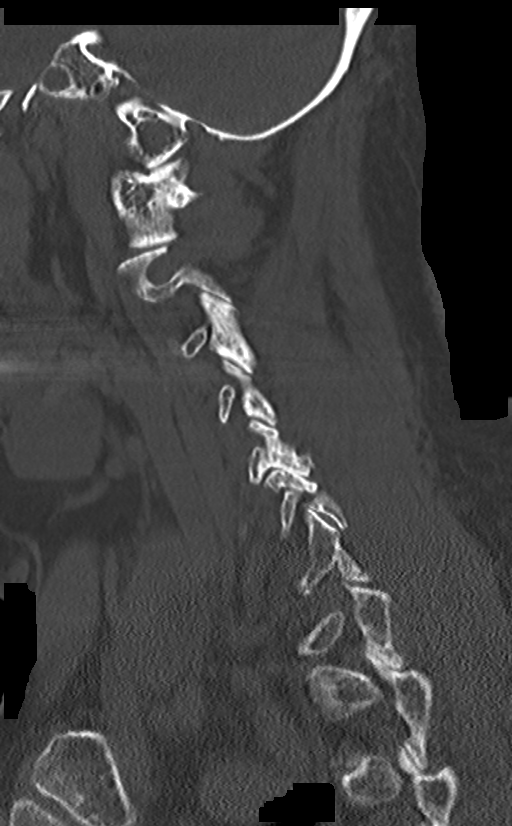
[im 26/63  bone]
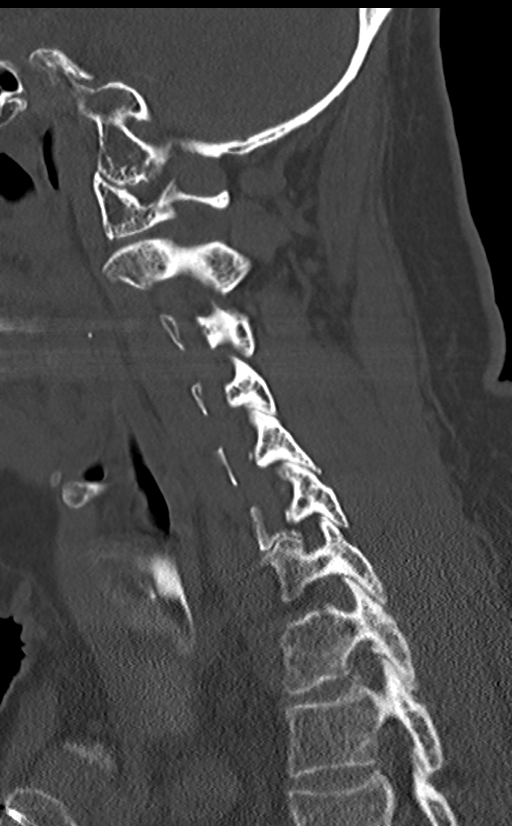
[im 32/63  soft-tissue]
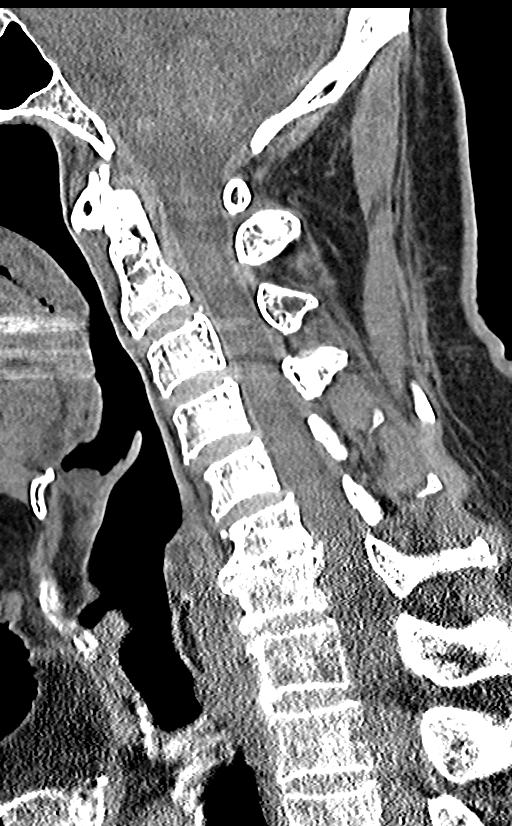
[im 32/63  bone]
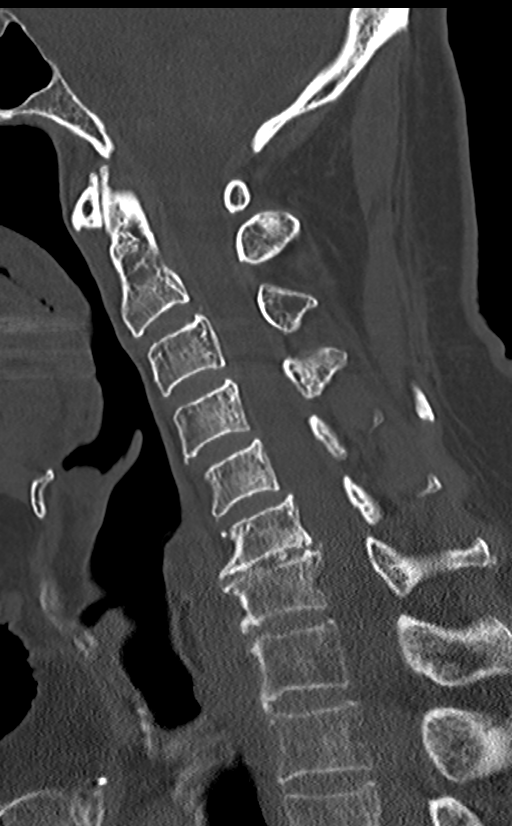
[im 37/63  bone]
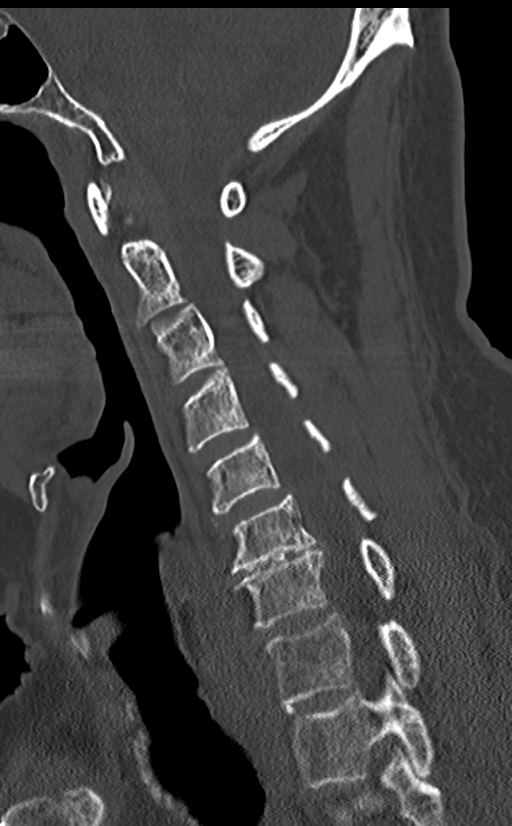
[im 42/63  bone]
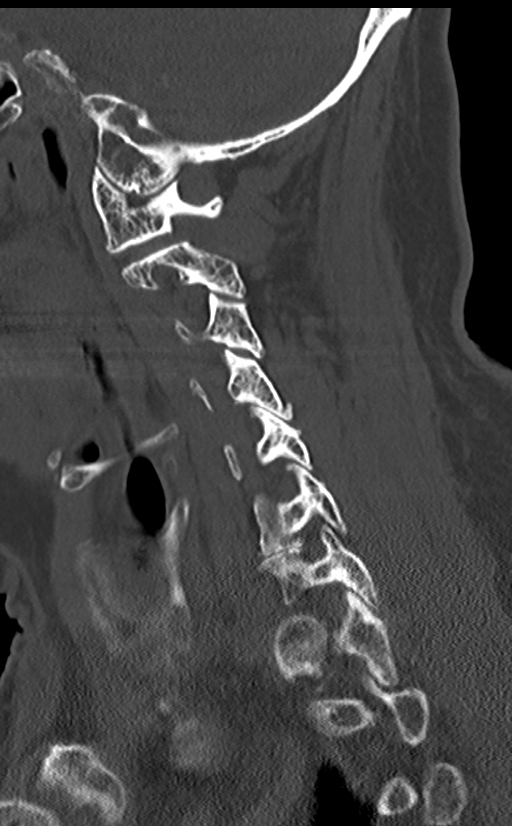

[12 of 33 positions shown; findings below may reference images not displayed]

FINDINGS: CT HEAD FINDINGS

Brain: Mild diffuse cortical atrophy is noted. Mild chronic ischemic
white matter disease is noted. No mass effect or midline shift is
noted. Ventricular size is within normal limits. There is no
evidence of mass lesion, hemorrhage or acute infarction.

Vascular: Atherosclerosis carotid siphons is noted.

Skull: Normal. Negative for fracture or focal lesion.

Sinuses/Orbits: No acute finding.

Other: None.

CT CERVICAL SPINE FINDINGS

Alignment: Mild grade 1 anterolisthesis of C4-5 and C5-6 is noted.

Skull base and vertebrae: No acute fracture. No primary bone lesion
or focal pathologic process.

Soft tissues and spinal canal: No prevertebral fluid or swelling. No
visible canal hematoma.

Disc levels: Severe degenerative disc disease is noted at C6-7 with
anterior and posterior osteophyte formation.

Upper chest: Negative.

Other: None.
IMPRESSION: Mild diffuse cortical atrophy. Mild chronic ischemic white matter
disease. No acute intracranial abnormality seen.

Severe degenerative disc disease is noted at C6-7. No acute
abnormality seen in the cervical spine.

## 2018-12-17 IMAGING — DX DG HAND COMPLETE 3+V*L*
3 series · 3 of 3 positions shown · non-contrast
Comparison: None.

CLINICAL DATA: Left hand pain after fall at home yesterday. Struck
left hand on concrete, multiple abrasions.

EXAM:
LEFT HAND - COMPLETE 3+ VIEW

[x hand pa left]
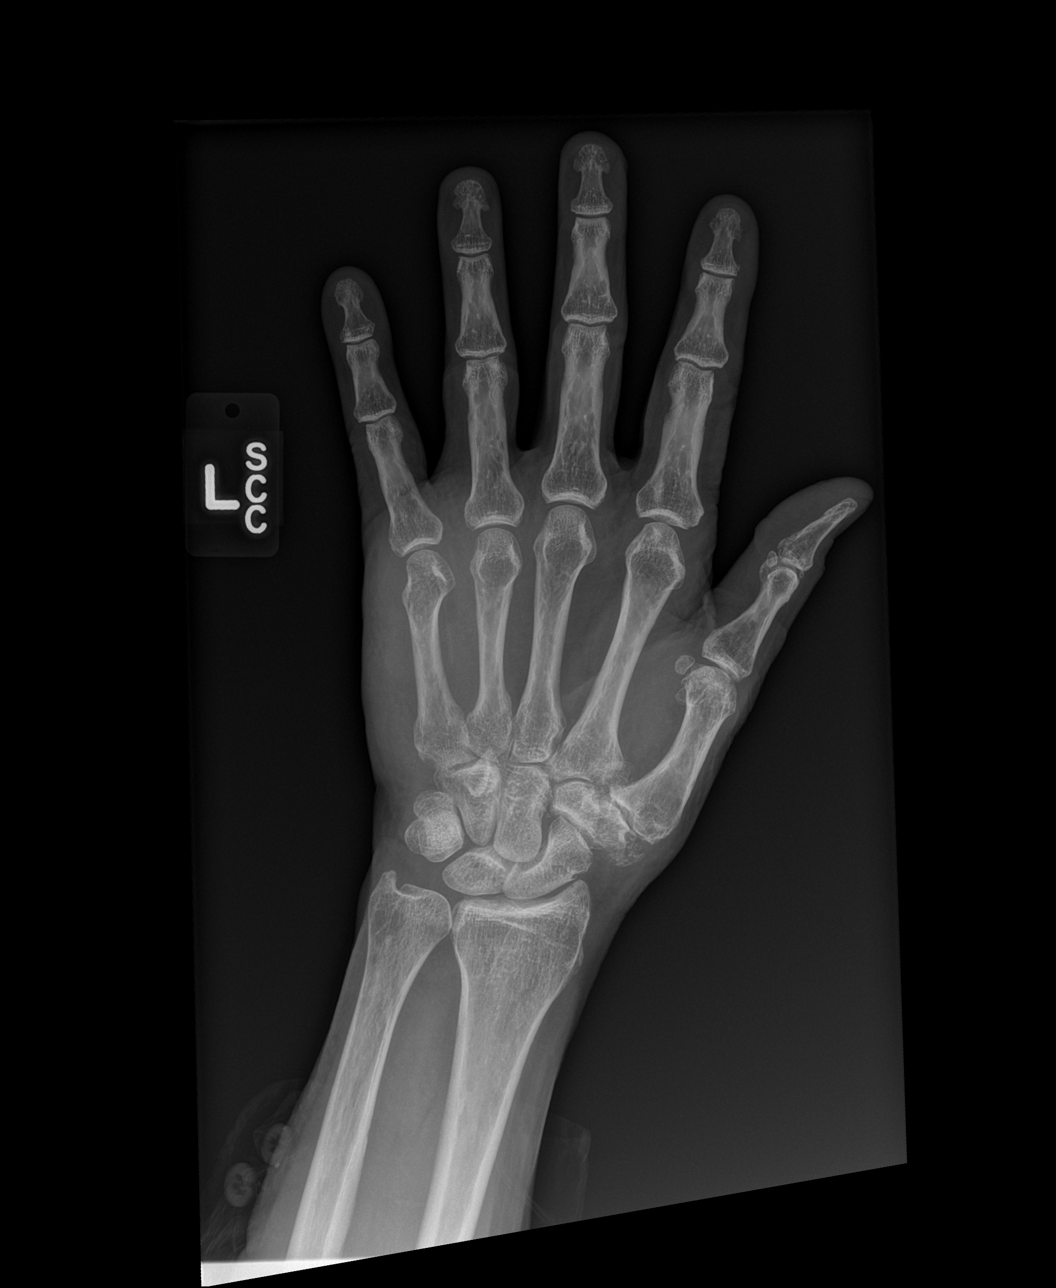

[x hand obl left]
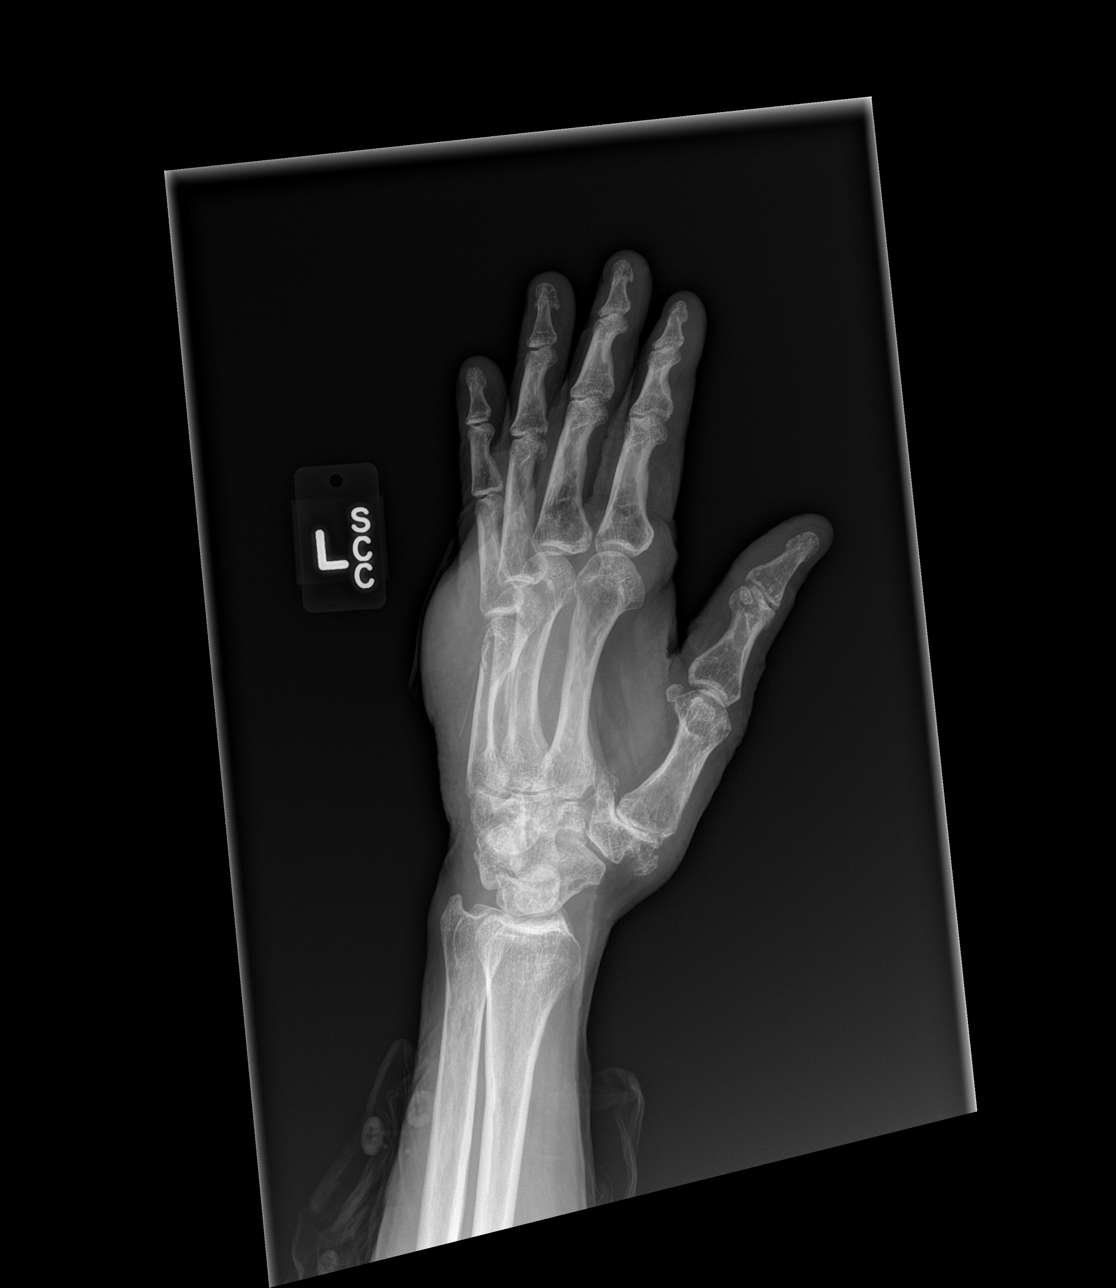

[x hand lat left]
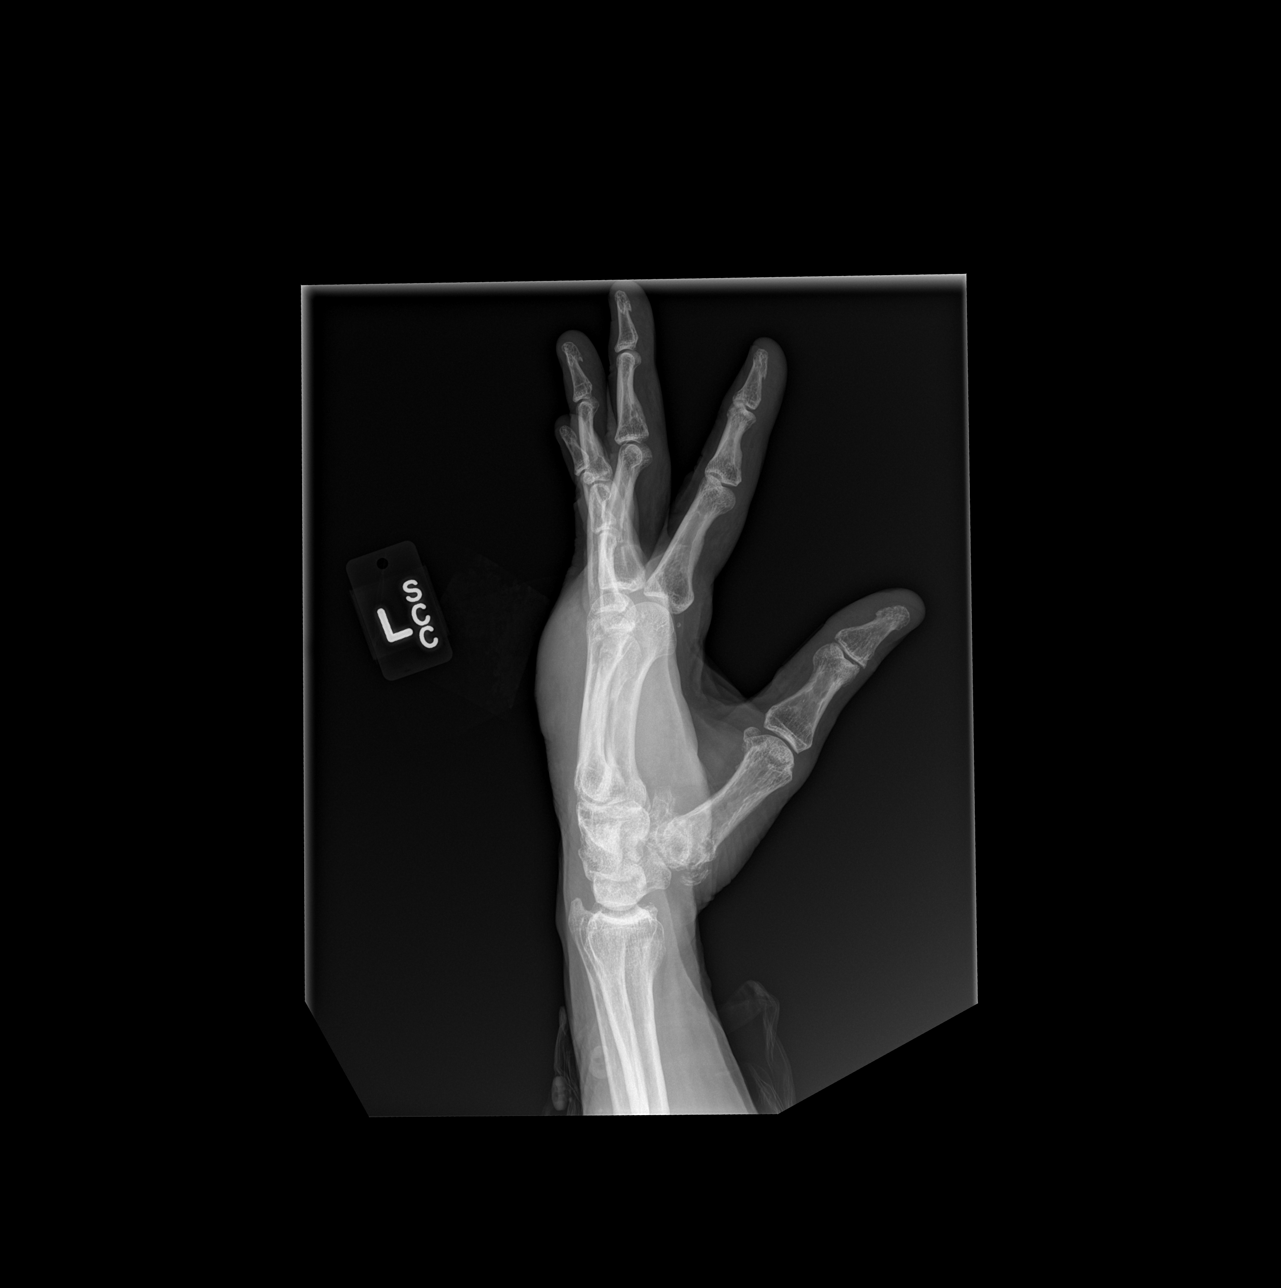

[3 of 3 positions shown; findings below may reference images not displayed]

FINDINGS: Nondisplaced fracture of the distal fifth metacarpal is best
appreciated on the oblique view. There is associated dorsal soft
tissue edema. No additional acute fracture. Advanced degenerative
change at the thumb carpal metacarpal joint. Lesser osteoarthritis
elsewhere. No radiopaque foreign body.
IMPRESSION: Nondisplaced distal fifth metacarpal fracture with associated soft
tissue edema.

## 2019-01-06 DIAGNOSIS — R69 Illness, unspecified: Secondary | ICD-10-CM | POA: Diagnosis not present

## 2019-03-01 DIAGNOSIS — H40023 Open angle with borderline findings, high risk, bilateral: Secondary | ICD-10-CM | POA: Diagnosis not present

## 2019-03-01 DIAGNOSIS — H04123 Dry eye syndrome of bilateral lacrimal glands: Secondary | ICD-10-CM | POA: Diagnosis not present

## 2019-03-11 DIAGNOSIS — R69 Illness, unspecified: Secondary | ICD-10-CM | POA: Diagnosis not present

## 2019-03-28 DIAGNOSIS — R69 Illness, unspecified: Secondary | ICD-10-CM | POA: Diagnosis not present

## 2019-04-05 DIAGNOSIS — N400 Enlarged prostate without lower urinary tract symptoms: Secondary | ICD-10-CM | POA: Diagnosis not present

## 2019-04-05 DIAGNOSIS — Z6823 Body mass index (BMI) 23.0-23.9, adult: Secondary | ICD-10-CM | POA: Diagnosis not present

## 2019-04-05 DIAGNOSIS — I2510 Atherosclerotic heart disease of native coronary artery without angina pectoris: Secondary | ICD-10-CM | POA: Diagnosis not present

## 2019-04-05 DIAGNOSIS — E1151 Type 2 diabetes mellitus with diabetic peripheral angiopathy without gangrene: Secondary | ICD-10-CM | POA: Diagnosis not present

## 2019-04-05 DIAGNOSIS — I10 Essential (primary) hypertension: Secondary | ICD-10-CM | POA: Diagnosis not present

## 2019-04-05 DIAGNOSIS — E782 Mixed hyperlipidemia: Secondary | ICD-10-CM | POA: Diagnosis not present

## 2019-04-05 DIAGNOSIS — N401 Enlarged prostate with lower urinary tract symptoms: Secondary | ICD-10-CM | POA: Diagnosis not present

## 2019-04-05 DIAGNOSIS — Z1159 Encounter for screening for other viral diseases: Secondary | ICD-10-CM | POA: Diagnosis not present

## 2019-04-05 DIAGNOSIS — E1142 Type 2 diabetes mellitus with diabetic polyneuropathy: Secondary | ICD-10-CM | POA: Diagnosis not present

## 2019-05-10 ENCOUNTER — Encounter: Payer: Self-pay | Admitting: Cardiovascular Disease

## 2019-05-10 ENCOUNTER — Other Ambulatory Visit: Payer: Self-pay

## 2019-05-10 ENCOUNTER — Ambulatory Visit: Payer: Medicare HMO | Admitting: Cardiovascular Disease

## 2019-05-10 VITALS — BP 124/72 | HR 63 | Ht 69.0 in | Wt 160.0 lb

## 2019-05-10 DIAGNOSIS — I2510 Atherosclerotic heart disease of native coronary artery without angina pectoris: Secondary | ICD-10-CM | POA: Diagnosis not present

## 2019-05-10 DIAGNOSIS — E785 Hyperlipidemia, unspecified: Secondary | ICD-10-CM

## 2019-05-10 MED ORDER — EZETIMIBE 10 MG PO TABS: 10.0000 mg | ORAL_TABLET | Freq: Every day | ORAL | 3 refills | Status: DC

## 2019-05-10 NOTE — Progress Notes (Signed)
Cardiology Office Note  Date:  05/10/2019   ID:  [name], [name] 1941-05-04, MRN [id number]  PCP:  Lillard Anes, MD  Cardiologist:   Mertie Moores, MD   No chief complaint on file.  1. Coronary artery disease-status post MI and eventually s/p CABG  ( 2003) and also stenting ( 2007) 2. Hyperlipidemia-he has been generally intolerant of statins-Crestor causes muscle pain, Lipitor causes memory loss, Zocor causes muscle pain. 2. Diabetes mellitus 3.  Chronic diastolic CHF:   [name] is an 78 year old gentleman with a history of coronary artery disease. He status post coronary artery bypass grafting. He is also status post PTCA and stenting. He also has a history of hyperlipidemia and diabetes mellitus.   He is exercising regularly . He does lots of yard work. He enjoys going to his [place] house.  January 05, 2013:  [name] is doing well. His garden is doing well. Occasional CP and dyspnea. Pains last only for a few seconds. Does not have to take a NTG. Mows the lawn and does yard work without problems.   Dec. 16, 2014:  [name] is doing well. Doing yard work without problems. Occasional episodes  December 12, 2013: [name] is doing ok. Rare CP . Frequent head aches  Dec. 1 , 2015:  No cardiac complaints.  Aches all over. "staggers " when he walks or stands. No angina. He notices That he has occasional episodes of dyspnea.    December 13, 2014:  [name] is a 78 y.o. male who presents for his  CAD. Very rare CP.  Has some DOE climbing up his hill at his house.  Nov. 29, 2016:  [name] continues to have some chest pain .    Seems to start in his lower abdomen and moves up. Will last for several days.  NTG works at times and at other times, has no effect.  Exercising some.   Fatigues easily .  Has leg pain with walking .   Thinks the diabetes has a lot to do with his fatigue . Glucose levels are ok  Goes to his beach  house in [place]   Nov 12, 2015:  Doing well from a cardiac standpoint. Got dizzy - does not know if he had a syncopal episode. Had not been out working much. He thinks he may have tripped on the step   Oct. 30 , 2017:  Doing well. No CP or dyspnea  Has had some sinus issues,  [facility] to [facility] ,   Hx of CAD - intolerant to statins  - lots of muscle aches   January 28, 2017:  [name] is seen today . Seems to be stable.   Still has a little CP with walking up hills but this seems to be stable .  Does not tolerate statins Is on Niacin,   Last lipids level looks good.   Feb. 8, 2019  [name] is seen back today  No CP , Has some knots on his legs.     Oct. 22, 2019: Seen wife wife today --  No CP or dyspnea. Has episodes of hypoglycemia and has subsequent dizziness.  Still moderately active.    Has some DOE working outside.  Legs and feet hurt him frequently  - has arthritis   May 10, 2019:  [name] is seen today for follow-up visit of his coronary artery disease and congestive heart failure.  I saw him via telemedicine visit  in April.  No cp or dyspnea.  Able to do all of his normal activities.    Past Medical History:  Diagnosis Date  . CAD (coronary artery disease)    Hx of   . Diabetes mellitus   . Diverticulosis of colon (without mention of hemorrhage) 2011   Colonoscopy   . Hiatal hernia 2011   EGD  . Hypertension   . ITP (idiopathic thrombocytopenic purpura)    He has been diagnosed with  . Pancytopenia     Past Surgical History:  Procedure Laterality Date  . CARDIAC CATHETERIZATION     His last heart catheterization in May of 2009 reveals a patent LIMA  . CORONARY ANGIOPLASTY     Successful percutaneous transluminal coronary angioplasty of the left circumflex obuse marginal vessel  . CORONARY ANGIOPLASTY WITH STENT PLACEMENT    . CORONARY ARTERY BYPASS GRAFT     x4 with a left internal mammary artery anastomosis to the left anterior  descending coronary artery   . CORONARY STENT PLACEMENT     successful percutaneous transluminal coronary angioplasty and stenting of the left main coronary artery  . HERNIA REPAIR    . SAPHENOUS VEIN GRAFT RESECTION     graft to the first obtuse marginal, a saphenous vein graft to the first diagonal coronary artery  and a saphenous vein graft to the distal right coronary artery  . US ECHOCARDIOGRAPHY  02-08-2007   Est. EF 40-45%     Current Outpatient Medications  Medication Sig Dispense Refill  . clopidogrel (PLAVIX) 75 MG tablet Take 1 tablet (75 mg total) by mouth daily. 90 tablet 3  . glipiZIDE (GLUCOTROL XL) 5 MG 24 hr tablet Take 5 mg by mouth daily with breakfast.    . isosorbide mononitrate (IMDUR) 30 MG 24 hr tablet Take 1 tablet (30 mg total) by mouth daily. 90 tablet 3  . lisinopril (PRINIVIL,ZESTRIL) 5 MG tablet Take 5 mg by mouth daily.    . metFORMIN (GLUCOPHAGE) 500 MG tablet Take 500 mg by mouth 3 (three) times daily.     . metoprolol tartrate (LOPRESSOR) 25 MG tablet Take 12.5 mg by mouth daily.    . Multiple Vitamin (MULTIVITAMIN) tablet Take 1 tablet by mouth daily.      . niacin 500 MG tablet Take 500 mg by mouth 2 (two) times daily with a meal.     . nitroGLYCERIN (NITROSTAT) 0.4 MG SL tablet Place 1 tablet (0.4 mg total) under the tongue every 5 (five) minutes as needed. 25 tablet 6  . ONE TOUCH ULTRA TEST test strip     . ezetimibe (ZETIA) 10 MG tablet Take 1 tablet (10 mg total) by mouth daily. 90 tablet 3   No current facility-administered medications for this visit.     Allergies:   Lipitor [atorvastatin calcium], Crestor [rosuvastatin calcium], and Zocor [simvastatin]    Social History:  The patient  reports that he has never smoked. He has never used smokeless tobacco. He reports that he does not drink alcohol or use drugs.   Family History:  The patient's family history is not on file.   ROS: Noted in current history, all other systems are negative.Marland Kitchen    Physical Exam: Blood pressure 124/72, pulse 63, height 5\' 9"  (1.753 m), weight 160 lb (72.6 kg), SpO2 99 %.  GEN:  Well nourished, well developed in no acute distress HEENT: Normal NECK: No JVD; No carotid bruits LYMPHATICS: No lymphadenopathy CARDIAC: RRR  no murmurs, rubs, gallops RESPIRATORY:  Clear to auscultation without rales, wheezing or rhonchi  ABDOMEN: Soft, non-tender, non-distended MUSCULOSKELETAL:  No edema; No deformity  SKIN: Warm and dry NEUROLOGIC:  Alert and oriented x 3  EKG:     May 10, 2019: Normal sinus rhythm at 63 beats a minute.  Rare premature ventricular contractions.  Recent Labs: 07/19/2018: Hemoglobin 14.2; Platelet Count 92   Lipid Panel    Component Value Date/Time   CHOL 149 08/20/2017 0904   TRIG 57 08/20/2017 0904   HDL 74 08/20/2017 0904   CHOLHDL 2.0 08/20/2017 0904   CHOLHDL 2.7 05/11/2016 1002   VLDL 18 05/11/2016 1002   LDLCALC 64 08/20/2017 0904      Wt Readings from Last 3 Encounters:  05/10/19 160 lb (72.6 kg)  11/08/18 170 lb (77.1 kg)  07/19/18 166 lb 6.4 oz (75.5 kg)      Other studies Reviewed: Additional studies/ records that were reviewed today include: . Review of the above records demonstrates:    ASSESSMENT AND PLAN:  1. Coronary artery disease-    No angina .   Has tried multiple statins    2. Hyperlipidemia-   Intolerant to lipator, crestor, simva  Suggested lipiid clinic ,   He finally agreed to try Zetia.   Does not know if he has tried it in the past.  Check lipids. Liver enz, bmp in 3 months .  3. Diabetes mellitus -  Asked lots of questions about his diabetes.   Referred him to his primary MD   4. Bradycardia:        The following changes have been made:  no change  Labs/ tests ordered today include:   Orders Placed This Encounter  Procedures  . Basic metabolic panel  . Hepatic function panel  . Lipid panel  . EKG 12-Lead     Disposition:   FU with  APP in 6 months    Mertie Moores, MD  05/10/2019 11:17 AM    [address], [place], [place]  [address] Phone: [phone]; Fax: [phone]

## 2019-05-10 NOTE — Patient Instructions (Signed)
Medication Instructions:  Your physician has recommended you make the following change in your medication:  1-START Zetia 10 mg by mouth daily.   *If you need a refill on your cardiac medications before your next appointment, please call your pharmacy*  Lab Work: Your physician recommends that you return for lab work in: 3 months for fasting lipid and liver panel and BMET.  If you have labs (blood work) drawn today and your tests are completely normal, you will receive your results only by: Marland Kitchen MyChart Message (if you have MyChart) OR . A paper copy in the mail If you have any lab test that is abnormal or we need to change your treatment, we will call you to review the results.  Testing/Procedures: None ordered today.  Follow-Up: At [facility], you and your health needs are our priority.  As part of our continuing mission to provide you with exceptional heart care, we have created designated Provider Care Teams.  These Care Teams include your primary Cardiologist (physician) and Advanced Practice Providers (APPs -  Physician Assistants and Nurse Practitioners) who all work together to provide you with the care you need, when you need it.  Your next appointment:   6 months  The format for your next appointment:   In Person  Provider:   You may see one of the following Advanced Practice Providers on your designated Care Team:    Richardson Dopp, PA-C  [place], [place]  Daune Perch, [place]

## 2019-06-21 DIAGNOSIS — H0100B Unspecified blepharitis left eye, upper and lower eyelids: Secondary | ICD-10-CM | POA: Diagnosis not present

## 2019-06-21 DIAGNOSIS — H0011 Chalazion right upper eyelid: Secondary | ICD-10-CM | POA: Diagnosis not present

## 2019-06-21 DIAGNOSIS — H0100A Unspecified blepharitis right eye, upper and lower eyelids: Secondary | ICD-10-CM | POA: Diagnosis not present

## 2019-06-23 DIAGNOSIS — R69 Illness, unspecified: Secondary | ICD-10-CM | POA: Diagnosis not present

## 2019-07-20 ENCOUNTER — Inpatient Hospital Stay: Payer: Medicare HMO | Attending: Internal Medicine

## 2019-07-20 ENCOUNTER — Inpatient Hospital Stay: Payer: Medicare HMO | Admitting: Internal Medicine

## 2019-07-20 ENCOUNTER — Other Ambulatory Visit: Payer: Self-pay

## 2019-07-20 ENCOUNTER — Encounter: Payer: Self-pay | Admitting: Internal Medicine

## 2019-07-20 VITALS — BP 137/60 | HR 63 | Temp 97.3°F | Resp 16 | Ht 69.0 in | Wt 157.0 lb

## 2019-07-20 DIAGNOSIS — I10 Essential (primary) hypertension: Secondary | ICD-10-CM | POA: Diagnosis not present

## 2019-07-20 DIAGNOSIS — Z7984 Long term (current) use of oral hypoglycemic drugs: Secondary | ICD-10-CM | POA: Insufficient documentation

## 2019-07-20 DIAGNOSIS — E119 Type 2 diabetes mellitus without complications: Secondary | ICD-10-CM | POA: Diagnosis not present

## 2019-07-20 DIAGNOSIS — D693 Immune thrombocytopenic purpura: Secondary | ICD-10-CM | POA: Insufficient documentation

## 2019-07-20 DIAGNOSIS — I2510 Atherosclerotic heart disease of native coronary artery without angina pectoris: Secondary | ICD-10-CM | POA: Diagnosis not present

## 2019-07-20 DIAGNOSIS — D61818 Other pancytopenia: Secondary | ICD-10-CM

## 2019-07-20 LAB — CMP (CANCER CENTER ONLY)
ALT: 17 U/L (ref 0–44)
AST: 24 U/L (ref 15–41)
Albumin: 4 g/dL (ref 3.5–5.0)
Alkaline Phosphatase: 42 U/L (ref 38–126)
Anion gap: 8 (ref 5–15)
BUN: 12 mg/dL (ref 8–23)
CO2: 28 mmol/L (ref 22–32)
Calcium: 9.3 mg/dL (ref 8.9–10.3)
Chloride: 103 mmol/L (ref 98–111)
Creatinine: 0.83 mg/dL (ref 0.61–1.24)
GFR, Est AFR Am: >60 mL/min (ref >60)
GFR, Estimated: >60 mL/min (ref >60)
Glucose, Bld: 110 mg/dL — ABNORMAL HIGH (ref 70–99)
Potassium: 4 mmol/L (ref 3.5–5.1)
Sodium: 139 mmol/L (ref 135–145)
Total Bilirubin: 1 mg/dL (ref 0.3–1.2)
Total Protein: 7.6 g/dL (ref 6.5–8.1)

## 2019-07-20 LAB — CBC WITH DIFFERENTIAL (CANCER CENTER ONLY)
Abs Immature Granulocytes: 0.02 10*3/uL (ref 0.00–0.07)
Basophils Absolute: 0 10*3/uL (ref 0.0–0.1)
Basophils Relative: 0 %
Eosinophils Absolute: 0.1 10*3/uL (ref 0.0–0.5)
Eosinophils Relative: 1 %
HCT: 41.9 % (ref 39.0–52.0)
Hemoglobin: 14.7 g/dL (ref 13.0–17.0)
Immature Granulocytes: 0 %
Lymphocytes Relative: 19 %
Lymphs Abs: 1.1 10*3/uL (ref 0.7–4.0)
MCH: 33.6 pg (ref 26.0–34.0)
MCHC: 35.1 g/dL (ref 30.0–36.0)
MCV: 95.7 fL (ref 80.0–100.0)
Monocytes Absolute: 0.5 10*3/uL (ref 0.1–1.0)
Monocytes Relative: 8 %
Neutro Abs: 4 10*3/uL (ref 1.7–7.7)
Neutrophils Relative %: 72 %
Platelet Count: 126 10*3/uL — ABNORMAL LOW (ref 150–400)
RBC: 4.38 MIL/uL (ref 4.22–5.81)
RDW: 12.3 % (ref 11.5–15.5)
WBC Count: 5.7 10*3/uL (ref 4.0–10.5)
nRBC: 0 % (ref 0.0–0.2)

## 2019-07-20 LAB — LACTATE DEHYDROGENASE: LDH: 176 U/L (ref 98–192)

## 2019-07-20 NOTE — Progress Notes (Signed)
[place] [phone]   [phone]  OFFICE PROGRESS NOTE  Lillard Anes, MD [place] [address]  DIAGNOSIS: Idiopathic thrombocytopenic purpura   PRIOR THERAPY: None   CURRENT THERAPY: Observation.  INTERVAL HISTORY: [name] 79 y.o. male returns to the clinic today for follow-up visit.  The patient is feeling fine today with no concerning complaints.  He denied having any chest pain, shortness of breath, cough or hemoptysis.  He denied having any fever or chills.  He has no nausea, vomiting, diarrhea or constipation.  He has no bleeding issues.  He is here today for evaluation and repeat blood work.  MEDICAL HISTORY: Past Medical History:  Diagnosis Date  . CAD (coronary artery disease)    Hx of   . Diabetes mellitus   . Diverticulosis of colon (without mention of hemorrhage) 2011   Colonoscopy   . Hiatal hernia 2011   EGD  . Hypertension   . ITP (idiopathic thrombocytopenic purpura)    He has been diagnosed with  . Pancytopenia     ALLERGIES:  is allergic to lipitor [atorvastatin calcium]; crestor [rosuvastatin calcium]; and zocor [simvastatin].  MEDICATIONS:  Current Outpatient Medications  Medication Sig Dispense Refill  . clopidogrel (PLAVIX) 75 MG tablet Take 1 tablet (75 mg total) by mouth daily. 90 tablet 3  . ezetimibe (ZETIA) 10 MG tablet Take 1 tablet (10 mg total) by mouth daily. 90 tablet 3  . glipiZIDE (GLUCOTROL XL) 5 MG 24 hr tablet Take 5 mg by mouth daily with breakfast.    . isosorbide mononitrate (IMDUR) 30 MG 24 hr tablet Take 1 tablet (30 mg total) by mouth daily. 90 tablet 3  . lisinopril (PRINIVIL,ZESTRIL) 5 MG tablet Take 5 mg by mouth daily.    . metFORMIN (GLUCOPHAGE) 500 MG tablet Take 500 mg by mouth 3 (three) times daily.     . metoprolol tartrate (LOPRESSOR) 25 MG tablet Take 12.5 mg by mouth daily.    . Multiple Vitamin (MULTIVITAMIN) tablet Take 1 tablet by mouth daily.       . niacin 500 MG tablet Take 500 mg by mouth 2 (two) times daily with a meal.     . nitroGLYCERIN (NITROSTAT) 0.4 MG SL tablet Place 1 tablet (0.4 mg total) under the tongue every 5 (five) minutes as needed. 25 tablet 6  . ONE TOUCH ULTRA TEST test strip      No current facility-administered medications for this visit.    SURGICAL HISTORY:  Past Surgical History:  Procedure Laterality Date  . CARDIAC CATHETERIZATION     His last heart catheterization in May of 2009 reveals a patent LIMA  . CORONARY ANGIOPLASTY     Successful percutaneous transluminal coronary angioplasty of the left circumflex obuse marginal vessel  . CORONARY ANGIOPLASTY WITH STENT PLACEMENT    . CORONARY ARTERY BYPASS GRAFT     x4 with a left internal mammary artery anastomosis to the left anterior descending coronary artery   . CORONARY STENT PLACEMENT     successful percutaneous transluminal coronary angioplasty and stenting of the left main coronary artery  . HERNIA REPAIR    . SAPHENOUS VEIN GRAFT RESECTION     graft to the first obtuse marginal, a saphenous vein graft to the first diagonal coronary artery  and a saphenous vein graft to the distal right coronary artery  . US ECHOCARDIOGRAPHY  02-08-2007   Est. EF 40-45%    REVIEW  OF SYSTEMS:  A comprehensive review of systems was negative.   PHYSICAL EXAMINATION: General appearance: alert, cooperative and no distress Head: Normocephalic, without obvious abnormality, atraumatic Neck: no adenopathy Lymph nodes: Cervical, supraclavicular, and axillary nodes normal. Resp: clear to auscultation bilaterally Back: symmetric, no curvature. ROM normal. No CVA tenderness. Cardio: regular rate and rhythm, S1, S2 normal, no murmur, click, rub or gallop GI: soft, non-tender; bowel sounds normal; no masses,  no organomegaly Extremities: extremities normal, atraumatic, no cyanosis or edema  ECOG PERFORMANCE STATUS: 0 - Asymptomatic  Blood pressure 137/60, pulse 63,  temperature (!) 97.3 F (36.3 C), temperature source Temporal, resp. rate 16, height 5\' 9"  (1.753 m), weight 157 lb (71.2 kg), SpO2 100 %.  LABORATORY DATA: Lab Results  Component Value Date   WBC 5.7 07/20/2019   HGB 14.7 07/20/2019   HCT 41.9 07/20/2019   MCV 95.7 07/20/2019   PLT 126 (L) 07/20/2019      Chemistry      Component Value Date/Time   NA 137 08/20/2017 0904   NA 135 (L) 07/14/2016 0935   K 4.2 08/20/2017 0904   K 4.4 07/14/2016 0935   CL 101 08/20/2017 0904   CL 106 07/19/2012 1028   CO2 24 08/20/2017 0904   CO2 24 07/14/2016 0935   BUN 10 08/20/2017 0904   BUN 10.4 07/14/2016 0935   CREATININE 0.77 08/20/2017 0904   CREATININE 0.8 07/14/2016 0935      Component Value Date/Time   CALCIUM 8.9 08/20/2017 0904   CALCIUM 9.0 07/14/2016 0935   ALKPHOS 75 08/20/2017 0904   ALKPHOS 60 07/14/2016 0935   AST 38 08/20/2017 0904   AST 31 07/14/2016 0935   ALT 36 08/20/2017 0904   ALT 33 07/14/2016 0935   BILITOT 1.0 08/20/2017 0904   BILITOT 1.67 (H) 07/14/2016 0935       RADIOGRAPHIC STUDIES: No results found.  ASSESSMENT AND PLAN:  This is a very pleasant 79 years old white male with idiopathic thrombocytopenic purpura. He is currently on observation and feeling well.  He denied having any bleeding, bruises or ecchymosis. Repeat CBC today showed improvement of his platelets count to 126,000. I recommended for the patient to continue on observation with repeat CBC, comprehensive metabolic panel and LDH in 1 year. He was advised to call immediately if he has any concerning symptoms in the interval. All questions were answered. The patient knows to call the clinic with any problems, questions or concerns. We can certainly see the patient much sooner if necessary.  Disclaimer: This note was dictated with voice recognition software. Similar sounding words can inadvertently be transcribed and may not be corrected upon review.

## 2019-07-21 ENCOUNTER — Telehealth: Payer: Self-pay | Admitting: Internal Medicine

## 2019-07-21 NOTE — Telephone Encounter (Signed)
Scheduled per los. Called and left msg. Mailed printout  °

## 2019-08-08 DIAGNOSIS — I2510 Atherosclerotic heart disease of native coronary artery without angina pectoris: Secondary | ICD-10-CM | POA: Diagnosis not present

## 2019-08-08 DIAGNOSIS — Z6822 Body mass index (BMI) 22.0-22.9, adult: Secondary | ICD-10-CM | POA: Diagnosis not present

## 2019-08-08 DIAGNOSIS — Z1159 Encounter for screening for other viral diseases: Secondary | ICD-10-CM | POA: Diagnosis not present

## 2019-08-08 DIAGNOSIS — E782 Mixed hyperlipidemia: Secondary | ICD-10-CM | POA: Diagnosis not present

## 2019-08-08 DIAGNOSIS — I10 Essential (primary) hypertension: Secondary | ICD-10-CM | POA: Diagnosis not present

## 2019-08-08 DIAGNOSIS — E1151 Type 2 diabetes mellitus with diabetic peripheral angiopathy without gangrene: Secondary | ICD-10-CM | POA: Diagnosis not present

## 2019-08-08 DIAGNOSIS — E1142 Type 2 diabetes mellitus with diabetic polyneuropathy: Secondary | ICD-10-CM | POA: Diagnosis not present

## 2019-08-08 DIAGNOSIS — N401 Enlarged prostate with lower urinary tract symptoms: Secondary | ICD-10-CM | POA: Diagnosis not present

## 2019-08-09 ENCOUNTER — Other Ambulatory Visit: Payer: Self-pay

## 2019-08-09 ENCOUNTER — Other Ambulatory Visit: Payer: Medicare HMO | Admitting: *Deleted

## 2019-08-09 DIAGNOSIS — E785 Hyperlipidemia, unspecified: Secondary | ICD-10-CM | POA: Diagnosis not present

## 2019-08-09 DIAGNOSIS — I2510 Atherosclerotic heart disease of native coronary artery without angina pectoris: Secondary | ICD-10-CM

## 2019-08-09 LAB — BASIC METABOLIC PANEL
BUN/Creatinine Ratio: 9 — ABNORMAL LOW (ref 10–24)
BUN: 8 mg/dL (ref 8–27)
CO2: 23 mmol/L (ref 20–29)
Calcium: 9.5 mg/dL (ref 8.6–10.2)
Chloride: 101 mmol/L (ref 96–106)
Creatinine, Ser: 0.85 mg/dL (ref 0.76–1.27)
GFR calc Af Amer: 95 mL/min/{1.73_m2} (ref >59)
GFR calc non Af Amer: 82 mL/min/{1.73_m2} (ref >59)
Glucose: 114 mg/dL — ABNORMAL HIGH (ref 65–99)
Potassium: 4.3 mmol/L (ref 3.5–5.2)
Sodium: 139 mmol/L (ref 134–144)

## 2019-08-09 LAB — HEPATIC FUNCTION PANEL
ALT: 13 IU/L (ref 0–44)
AST: 19 IU/L (ref 0–40)
Albumin: 4 g/dL (ref 3.7–4.7)
Alkaline Phosphatase: 45 IU/L (ref 39–117)
Bilirubin Total: 1 mg/dL (ref 0.0–1.2)
Bilirubin, Direct: 0.28 mg/dL (ref 0.00–0.40)
Total Protein: 6.7 g/dL (ref 6.0–8.5)

## 2019-08-09 LAB — LIPID PANEL
Chol/HDL Ratio: 2.3 ratio (ref 0.0–5.0)
Cholesterol, Total: 173 mg/dL (ref 100–199)
HDL: 74 mg/dL (ref >39)
LDL Chol Calc (NIH): 83 mg/dL (ref 0–99)
Triglycerides: 91 mg/dL (ref 0–149)
VLDL Cholesterol Cal: 16 mg/dL (ref 5–40)

## 2019-08-24 DIAGNOSIS — E119 Type 2 diabetes mellitus without complications: Secondary | ICD-10-CM | POA: Diagnosis not present

## 2019-08-24 DIAGNOSIS — H524 Presbyopia: Secondary | ICD-10-CM | POA: Diagnosis not present

## 2019-08-24 DIAGNOSIS — H04123 Dry eye syndrome of bilateral lacrimal glands: Secondary | ICD-10-CM | POA: Diagnosis not present

## 2019-08-24 DIAGNOSIS — H5202 Hypermetropia, left eye: Secondary | ICD-10-CM | POA: Diagnosis not present

## 2019-08-24 DIAGNOSIS — H52203 Unspecified astigmatism, bilateral: Secondary | ICD-10-CM | POA: Diagnosis not present

## 2019-08-24 DIAGNOSIS — Z7984 Long term (current) use of oral hypoglycemic drugs: Secondary | ICD-10-CM | POA: Diagnosis not present

## 2019-08-24 DIAGNOSIS — H0100B Unspecified blepharitis left eye, upper and lower eyelids: Secondary | ICD-10-CM | POA: Diagnosis not present

## 2019-08-24 DIAGNOSIS — H40023 Open angle with borderline findings, high risk, bilateral: Secondary | ICD-10-CM | POA: Diagnosis not present

## 2019-08-24 DIAGNOSIS — H0100A Unspecified blepharitis right eye, upper and lower eyelids: Secondary | ICD-10-CM | POA: Diagnosis not present

## 2019-08-24 DIAGNOSIS — H5211 Myopia, right eye: Secondary | ICD-10-CM | POA: Diagnosis not present

## 2019-09-02 ENCOUNTER — Ambulatory Visit: Payer: Medicare HMO

## 2019-09-08 ENCOUNTER — Ambulatory Visit: Payer: Medicare HMO | Attending: Internal Medicine

## 2019-09-08 DIAGNOSIS — Z23 Encounter for immunization: Secondary | ICD-10-CM | POA: Insufficient documentation

## 2019-09-08 NOTE — Progress Notes (Signed)
    Covid-19 Vaccination Clinic  Name:  [name]    MRN: [id number] DOB: Nov 17, 1940  09/08/2019  [name] was observed post Covid-19 immunization for 15 minutes without incidence. He was provided with Vaccine Information Sheet and instruction to access the V-Safe system.   [name] was instructed to call 911 with any severe reactions post vaccine: Marland Kitchen Difficulty breathing  . Swelling of your face and throat  . A fast heartbeat  . A bad rash all over your body  . Dizziness and weakness    Immunizations Administered    Name Date Dose VIS Date Route   Pfizer COVID-19 Vaccine 09/08/2019 12:14 PM 0.3 mL 06/23/2019 Intramuscular   Manufacturer: [place]   Lot: [id number]   [place]: [id number]

## 2019-09-22 ENCOUNTER — Other Ambulatory Visit: Payer: Self-pay | Admitting: Legal Medicine

## 2019-09-22 DIAGNOSIS — R69 Illness, unspecified: Secondary | ICD-10-CM | POA: Diagnosis not present

## 2019-10-03 DIAGNOSIS — R69 Illness, unspecified: Secondary | ICD-10-CM | POA: Diagnosis not present

## 2019-10-04 ENCOUNTER — Ambulatory Visit: Payer: Medicare HMO | Attending: Internal Medicine

## 2019-10-04 DIAGNOSIS — Z23 Encounter for immunization: Secondary | ICD-10-CM

## 2019-10-04 NOTE — Progress Notes (Signed)
   Covid-19 Vaccination Clinic  Name:  [name]    MRN: [id number] DOB: 01/08/1941  10/04/2019  [name] was observed post Covid-19 immunization for 15 minutes without incident. He was provided with Vaccine Information Sheet and instruction to access the V-Safe system.   [name] was instructed to call 911 with any severe reactions post vaccine: Marland Kitchen Difficulty breathing  . Swelling of face and throat  . A fast heartbeat  . A bad rash all over body  . Dizziness and weakness   Immunizations Administered    Name Date Dose VIS Date Route   Pfizer COVID-19 Vaccine 10/04/2019  4:08 PM 0.3 mL 06/23/2019 Intramuscular   Manufacturer: [place]   Lot: [id number]   [place]: [id number]

## 2019-10-09 ENCOUNTER — Other Ambulatory Visit: Payer: Self-pay

## 2019-10-09 DIAGNOSIS — E1165 Type 2 diabetes mellitus with hyperglycemia: Secondary | ICD-10-CM

## 2019-10-09 DIAGNOSIS — IMO0002 Reserved for concepts with insufficient information to code with codable children: Secondary | ICD-10-CM

## 2019-10-09 DIAGNOSIS — R69 Illness, unspecified: Secondary | ICD-10-CM | POA: Diagnosis not present

## 2019-10-09 MED ORDER — ONETOUCH ULTRA VI STRP: 1.0000 | ORAL_STRIP | 4 refills | Status: DC | PRN

## 2019-10-09 MED ORDER — LANCETS MISC: 1.0000 | Freq: Every day | 4 refills | Status: DC | PRN

## 2019-11-07 NOTE — Progress Notes (Deleted)
{Choose 1 Note Type (Video or Telephone):[id number]}   The patient was identified using 2 identifiers.  Date:  11/07/2019   ID:  [name], DOB [birth year], MRN [id number]  {Patient Location:[phone]::"Home"} {Provider Location:[phone]::"Home"}  PCP:  Lillard Anes, MD  Cardiologist:  Mertie Moores, MD *** Electrophysiologist:  None   Evaluation Performed:  {Choose Visit [id number] Visit"}  Chief Complaint:  ***  Patient Profile: [name] is a 79 y.o. male with:  Coronary artery disease   S/p Inf MI 2003 >> S/p CABG  S/p ant-lat STEMI in 2007 >> s/p BMS to LM  Cath 11/2007: LM stent patent, 2/4 grafts patent >> Med Rx   Hypertension   Hyperlipidemia   Intol of statins   Diabetes mellitus   Chronic Diastolic CHF  Echocardiogram 07/2013: EF 50-55, Gr 2 DD  ITP  Prior CV Studies: Echocardiogram 07/18/2013 Mild focal basal septal hypertrophy, EF 50-55, no RWMA, Gr 2 DD, mild AI, mild MR, mod LAE, mild RVE, trivial effusion   Cardiac catheterization 11/30/2007 LM stent patent LAD prox 90, then 100; D1 and D2 ost 90 LCx 60-80 RCA 100; PDA and PL severe dz (75-90) S-RCA patent  L-LAD patent  S-D1 100 S-LCx 100 EF 40-45   History of Present Illness:   Mr. Angelos ***    Past Medical History:  Diagnosis Date  . CAD (coronary artery disease)    Hx of   . Diabetes mellitus   . Diverticulosis of colon (without mention of hemorrhage) 2011   Colonoscopy   . Hiatal hernia 2011   EGD  . Hypertension   . ITP (idiopathic thrombocytopenic purpura)    He has been diagnosed with  . Pancytopenia    Past Surgical History:  Procedure Laterality Date  . CARDIAC CATHETERIZATION     His last heart catheterization in May of 2009 reveals a patent LIMA  . CORONARY ANGIOPLASTY     Successful percutaneous transluminal coronary angioplasty of the left circumflex obuse marginal vessel  . CORONARY ANGIOPLASTY WITH STENT PLACEMENT     . CORONARY ARTERY BYPASS GRAFT     x4 with a left internal mammary artery anastomosis to the left anterior descending coronary artery   . CORONARY STENT PLACEMENT     successful percutaneous transluminal coronary angioplasty and stenting of the left main coronary artery  . HERNIA REPAIR    . SAPHENOUS VEIN GRAFT RESECTION     graft to the first obtuse marginal, a saphenous vein graft to the first diagonal coronary artery  and a saphenous vein graft to the distal right coronary artery  . US ECHOCARDIOGRAPHY  02-08-2007   Est. EF 40-45%     No outpatient medications have been marked as taking for the 11/08/19 encounter (Appointment) with Richardson Dopp T, PA-C.     Allergies:   Lipitor [atorvastatin calcium], Crestor [rosuvastatin calcium], and Zocor [simvastatin]   Social History   Tobacco Use  . Smoking status: Never Smoker  . Smokeless tobacco: Never Used  Substance Use Topics  . Alcohol use: No  . Drug use: No     Family Hx: The patient's family history is negative for Colon cancer.  ROS:   Please see the history of present illness.    *** All other systems reviewed and are negative.   Prior CV studies:   The following studies were reviewed today:  ***  Labs/Other Tests and Data Reviewed:    EKG:  {EKG/Telemetry Strips Reviewed:[phone]}  Recent Labs: 07/20/2019: Hemoglobin 14.7;  Platelet Count 126 08/09/2019: ALT 13; BUN 8; Creatinine, Ser 0.85; Potassium 4.3; Sodium 139   Recent Lipid Panel Lab Results  Component Value Date/Time   CHOL 173 08/09/2019 08:07 AM   TRIG 91 08/09/2019 08:07 AM   HDL 74 08/09/2019 08:07 AM   CHOLHDL 2.3 08/09/2019 08:07 AM   CHOLHDL 2.7 05/11/2016 10:02 AM   LDLCALC 83 08/09/2019 08:07 AM    Wt Readings from Last 3 Encounters:  07/20/19 157 lb (71.2 kg)  05/10/19 160 lb (72.6 kg)  11/08/18 170 lb (77.1 kg)     Objective:    Vital Signs:  There were no vitals taken for this visit.   {HeartCare Virtual Exam  (Optional):[phone]::"VITAL SIGNS:  reviewed"}  ASSESSMENT & PLAN:    1. ***  COVID-19 Education: The signs and symptoms of COVID-19 were discussed with the patient and how to seek care for testing (follow up with PCP or arrange E-visit).  ***The importance of social distancing was discussed today.  Time:   Today, I have spent *** minutes with the patient with telehealth technology discussing the above problems.     Medication Adjustments/Labs and Tests Ordered: Current medicines are reviewed at length with the patient today.  Concerns regarding medicines are outlined above.   Tests Ordered: No orders of the defined types were placed in this encounter.   Medication Changes: No orders of the defined types were placed in this encounter.   Follow Up:  {F/U Format:[phone]} {follow up:15908}  Signed, Richardson Dopp, PA-C  11/07/2019 10:28 PM    [facility]

## 2019-11-08 ENCOUNTER — Other Ambulatory Visit: Payer: Self-pay | Admitting: *Deleted

## 2019-11-08 ENCOUNTER — Ambulatory Visit: Payer: Medicare HMO | Admitting: Physician Assistant

## 2019-11-08 MED ORDER — NITROGLYCERIN 0.4 MG SL SUBL: 0.4000 mg | SUBLINGUAL_TABLET | SUBLINGUAL | 6 refills | Status: DC | PRN

## 2019-11-14 NOTE — Progress Notes (Signed)
Cardiology Office Note:    Date:  11/15/2019   ID:  [name], DOB April 21, 1941, MRN [id number]  PCP:  Lillard Anes, MD  Cardiologist:  Mertie Moores, MD  Electrophysiologist:  None   Referring MD: Lillard Anes,*   Chief Complaint:  Follow-up (CAD)    Patient Profile:    [name] is a 79 y.o. male with:   Coronary artery disease   S/p MI in 2003 >> s/p CABG  S/p stent to LM in 2007  Cath 2009: LM stent ok, S-RCA/PDA (RCA segment patent, PDA segment w severe dz), L-LAD patent, S-D1 and S-LCx 100 >> med Rx   Hyperlipidemia   Statin intol  Chronic diastolic CHF  Diabetes mellitus   Hypertension   ITP  Prior CV studies: Echocardiogram 07/18/13 EF 50-55, no RWMA, Gr 2 DD, mild AI, mild MR, mod LAE, trivial eff  Cardiac catheterization 11/30/2007 LM stent patent LAD prox 90, then 100; D1 and D2 90 LCx 60-80 RCA 100 S-RCA/PDA - RCA segment patent, PDA and PL segment severe dz (75-90) L-LAD patent S-D1 100 S-LCx 100 EF 40-45   History of Present Illness:    Mr. Custard was last seen in 04/2019 by Dr. Acie Fredrickson.  He returns for 6 mos follow up.  He is here alone.  Since last seen, he has been doing well.  He has occasional, brief chest pains.  He rarely takes nitroglycerin.  He has not had any change in his anginal pattern.  He has dyspnea with more extreme activities.  He has not had orthopnea, lower extremity swelling or syncope.  He does have symptoms of dizziness described as spinning.  This typically occurs if he stands up too quickly.  Past Medical History:  Diagnosis Date  . CAD (coronary artery disease)    Hx of   . Diabetes mellitus   . Diverticulosis of colon (without mention of hemorrhage) 2011   Colonoscopy   . Hiatal hernia 2011   EGD  . Hypertension   . ITP (idiopathic thrombocytopenic purpura)    He has been diagnosed with  . Pancytopenia     Current Medications: Current Meds  Medication Sig  . cholecalciferol  (VITAMIN D3) 25 MCG (1000 UT) tablet Take 5,000 Units by mouth daily.  . clopidogrel (PLAVIX) 75 MG tablet Take 1 tablet (75 mg total) by mouth daily.  Marland Kitchen ezetimibe (ZETIA) 10 MG tablet Take 1 tablet (10 mg total) by mouth daily.  Marland Kitchen glipiZIDE (GLUCOTROL XL) 5 MG 24 hr tablet Take 5 mg by mouth daily with breakfast.  . glucose blood (ONETOUCH ULTRA) test strip 1 each by Other route as needed for other. Use as instructed  . isosorbide mononitrate (IMDUR) 30 MG 24 hr tablet Take 1 tablet (30 mg total) by mouth daily.  . Lancets MISC 1 each by Does not apply route daily as needed. Please provide lancets that fit patients lancet device  . lisinopril (PRINIVIL,ZESTRIL) 5 MG tablet Take 5 mg by mouth daily.  . metFORMIN (GLUCOPHAGE) 500 MG tablet Take 500 mg by mouth 3 (three) times daily.   . metoprolol tartrate (LOPRESSOR) 25 MG tablet Take 12.5 mg by mouth daily.  . Multiple Vitamin (MULTIVITAMIN) tablet Take 1 tablet by mouth daily.    . nitroGLYCERIN (NITROSTAT) 0.4 MG SL tablet Place 1 tablet (0.4 mg total) under the tongue every 5 (five) minutes as needed.  . [DISCONTINUED] niacin 500 MG tablet Take 500 mg by mouth 2 (two) times daily  with a meal.      Allergies:   Lipitor [atorvastatin calcium], Crestor [rosuvastatin calcium], and Zocor [simvastatin]   Social History   Tobacco Use  . Smoking status: Never Smoker  . Smokeless tobacco: Never Used  Substance Use Topics  . Alcohol use: No  . Drug use: No     Family Hx: The patient's family history is negative for Colon cancer.  ROS   EKGs/Labs/Other Test Reviewed:    EKG:  EKG is  ordered today.  The ekg ordered today demonstrates normal sinus rhythm, heart rate 61, left axis deviation, nonspecific ST-T wave changes, QTC 422, no change from prior tracing  Recent Labs: 07/20/2019: Hemoglobin 14.7; Platelet Count 126 08/09/2019: ALT 13; BUN 8; Creatinine, Ser 0.85; Potassium 4.3; Sodium 139   Recent Lipid Panel Lab Results  Component  Value Date/Time   CHOL 173 08/09/2019 08:07 AM   TRIG 91 08/09/2019 08:07 AM   HDL 74 08/09/2019 08:07 AM   CHOLHDL 2.3 08/09/2019 08:07 AM   CHOLHDL 2.7 05/11/2016 10:02 AM   LDLCALC 83 08/09/2019 08:07 AM    Physical Exam:    VS:  BP 132/64   Pulse 68   Ht 5\' 9"  (1.753 m)   Wt 157 lb (71.2 kg)   SpO2 99%   BMI 23.18 kg/m     Wt Readings from Last 3 Encounters:  11/15/19 157 lb (71.2 kg)  07/20/19 157 lb (71.2 kg)  05/10/19 160 lb (72.6 kg)     Constitutional:      Appearance: Healthy appearance. Not in distress.  Neck:     Vascular: JVD normal.  Pulmonary:     Effort: Pulmonary effort is normal.     Breath sounds: No wheezing. No rales.  Cardiovascular:     Normal rate. Regular rhythm. Normal S1. Normal S2.     Murmurs: There is no murmur.  Edema:    Peripheral edema absent.  Abdominal:     Palpations: Abdomen is soft. There is no hepatomegaly.  Skin:    General: Skin is warm and dry.  Neurological:     General: No focal deficit present.     Mental Status: Alert and oriented to person, place and time.     Cranial Nerves: Cranial nerves are intact.      ASSESSMENT & PLAN:    1. Coronary artery disease of native artery of native heart with stable angina pectoris ([place]) History of CABG in 2003 after a myocardial infarction and subsequent stenting to the left main in 2007.  Cardiac catheterization in 2009 demonstrated patent left main stent and patent LIMA-LAD.  Vein graft to the diagonal and vein graft to the LCx were both occluded.  The SVG-RCA/PDA was patent in the RCA limb but had severe disease in the PDA limb.  Overall, he has been doing well without significant anginal symptoms.  He rarely takes nitroglycerin.  Continue current management which includes clopidogrel, ezetimibe, isosorbide mononitrate, metoprolol tartrate.  Follow-up in 6 months.  2. Chronic diastolic congestive heart failure ([place]) Echocardiogram 2015 with moderate diastolic dysfunction and EF  50-55.  He is NYHA II.  He does not take chronic diuretic therapy.  Volume status is stable.  3. Essential hypertension The patient's blood pressure is controlled on his current regimen.  Continue current therapy.   4. Mixed hyperlipidemia He is tolerating ezetimibe.  Recent LDL close to goal.  Continue current therapy.  5. Dizziness He describes symptoms of spinning.  These are fairly chronic.  I suspect he may have benign positional vertigo.  I have encouraged him to follow-up with primary care to determine if he should be referred to physical therapy or ENT.    Dispo:  Return in about 6 months (around 05/17/2020) for Routine Follow Up, w/ Dr. Acie Fredrickson, in person.   Medication Adjustments/Labs and Tests Ordered: Current medicines are reviewed at length with the patient today.  Concerns regarding medicines are outlined above.  Tests Ordered: Orders Placed This Encounter  Procedures  . EKG 12-Lead   Medication Changes: No orders of the defined types were placed in this encounter.   Signed, Richardson Dopp, PA-C  11/15/2019 9:46 AM    [facility] [address], [place], [place]  [address] Phone: [phone]; Fax: [phone]

## 2019-11-15 ENCOUNTER — Encounter: Payer: Self-pay | Admitting: Physician Assistant

## 2019-11-15 ENCOUNTER — Ambulatory Visit: Payer: Medicare HMO | Admitting: Physician Assistant

## 2019-11-15 ENCOUNTER — Other Ambulatory Visit: Payer: Self-pay

## 2019-11-15 VITALS — BP 132/64 | HR 68 | Ht 69.0 in | Wt 157.0 lb

## 2019-11-15 DIAGNOSIS — R42 Dizziness and giddiness: Secondary | ICD-10-CM | POA: Diagnosis not present

## 2019-11-15 DIAGNOSIS — I5032 Chronic diastolic (congestive) heart failure: Secondary | ICD-10-CM

## 2019-11-15 DIAGNOSIS — I10 Essential (primary) hypertension: Secondary | ICD-10-CM | POA: Diagnosis not present

## 2019-11-15 DIAGNOSIS — I25118 Atherosclerotic heart disease of native coronary artery with other forms of angina pectoris: Secondary | ICD-10-CM | POA: Diagnosis not present

## 2019-11-15 DIAGNOSIS — E782 Mixed hyperlipidemia: Secondary | ICD-10-CM | POA: Diagnosis not present

## 2019-11-15 DIAGNOSIS — I2510 Atherosclerotic heart disease of native coronary artery without angina pectoris: Secondary | ICD-10-CM

## 2019-11-15 NOTE — Patient Instructions (Signed)
Medication Instructions:  °Your physician recommends that you continue on your current medications as directed. Please refer to the Current Medication list given to you today. ° °*If you need a refill on your cardiac medications before your next appointment, please call your pharmacy* ° °Lab Work: °None ordered today ° °Testing/Procedures: °None ordered today ° °Follow-Up: °At [facility], you and your health needs are our priority.  As part of our continuing mission to provide you with exceptional heart care, we have created designated Provider Care Teams.  These Care Teams include your primary Cardiologist (physician) and Advanced Practice Providers (APPs -  Physician Assistants and Nurse Practitioners) who all work together to provide you with the care you need, when you need it. ° °We recommend signing up for the patient portal called "MyChart".  Sign up information is provided on this After Visit Summary.  MyChart is used to connect with patients for Virtual Visits (Telemedicine).  Patients are able to view lab/test results, encounter notes, upcoming appointments, etc.  Non-urgent messages can be sent to your provider as well.   °To learn more about what you can do with MyChart, go to [email or web address].   ° °Your next appointment:   °6 month(s) ° °The format for your next appointment:   °In Person ° °Provider:   °Philip Nahser, MD ° ° °

## 2019-12-04 ENCOUNTER — Encounter: Payer: Self-pay | Admitting: Legal Medicine

## 2019-12-04 DIAGNOSIS — N401 Enlarged prostate with lower urinary tract symptoms: Secondary | ICD-10-CM

## 2019-12-04 DIAGNOSIS — I119 Hypertensive heart disease without heart failure: Secondary | ICD-10-CM

## 2019-12-04 DIAGNOSIS — J449 Chronic obstructive pulmonary disease, unspecified: Secondary | ICD-10-CM

## 2019-12-04 DIAGNOSIS — E1151 Type 2 diabetes mellitus with diabetic peripheral angiopathy without gangrene: Secondary | ICD-10-CM

## 2019-12-04 HISTORY — DX: Hypertensive heart disease without heart failure: I11.9

## 2019-12-04 HISTORY — DX: Type 2 diabetes mellitus with diabetic peripheral angiopathy without gangrene: E11.51

## 2019-12-04 HISTORY — DX: Chronic obstructive pulmonary disease, unspecified: J44.9

## 2019-12-04 HISTORY — DX: Benign prostatic hyperplasia with lower urinary tract symptoms: N40.1

## 2019-12-05 ENCOUNTER — Other Ambulatory Visit: Payer: Self-pay

## 2019-12-05 ENCOUNTER — Encounter: Payer: Self-pay | Admitting: Legal Medicine

## 2019-12-05 ENCOUNTER — Ambulatory Visit (INDEPENDENT_AMBULATORY_CARE_PROVIDER_SITE_OTHER): Payer: Medicare HMO | Admitting: Legal Medicine

## 2019-12-05 VITALS — BP 118/70 | HR 72 | Temp 97.0°F | Resp 16 | Ht 69.0 in | Wt 154.4 lb

## 2019-12-05 DIAGNOSIS — I10 Essential (primary) hypertension: Secondary | ICD-10-CM | POA: Diagnosis not present

## 2019-12-05 DIAGNOSIS — I119 Hypertensive heart disease without heart failure: Secondary | ICD-10-CM | POA: Diagnosis not present

## 2019-12-05 DIAGNOSIS — J449 Chronic obstructive pulmonary disease, unspecified: Secondary | ICD-10-CM

## 2019-12-05 DIAGNOSIS — E1142 Type 2 diabetes mellitus with diabetic polyneuropathy: Secondary | ICD-10-CM

## 2019-12-05 DIAGNOSIS — Z6822 Body mass index (BMI) 22.0-22.9, adult: Secondary | ICD-10-CM

## 2019-12-05 DIAGNOSIS — E1151 Type 2 diabetes mellitus with diabetic peripheral angiopathy without gangrene: Secondary | ICD-10-CM | POA: Diagnosis not present

## 2019-12-05 DIAGNOSIS — N401 Enlarged prostate with lower urinary tract symptoms: Secondary | ICD-10-CM

## 2019-12-05 DIAGNOSIS — E782 Mixed hyperlipidemia: Secondary | ICD-10-CM

## 2019-12-05 DIAGNOSIS — Z6821 Body mass index (BMI) 21.0-21.9, adult: Secondary | ICD-10-CM | POA: Insufficient documentation

## 2019-12-05 LAB — PULMONARY FUNCTION TEST
FEV1/FVC: 67.5 %
FEV1: 1.89 L
FVC: 2.52 L

## 2019-12-05 NOTE — Assessment & Plan Note (Signed)
AN INDIVIDUAL CARE PLAN for hyperlipidemia/ cholesterol was established and reinforced today.  The patient's status was assessed using clinical findings on exam, lab and other diagnostic tests. The patient's disease status was assessed based on evidence-based guidelines and found to be well controlled. MEDICATIONS were rviewed. SELF MANAGEMENT GOALS have been discussed and patient's success at attaining the goal of low cholesterol was assessed. RECOMMENDATION given include regular exercise 3 days a week and low cholesterol/low fat diet. CLINICAL SUMMARY including written plan to identify barriers unique to the patient due to social or economic  reasons was discussed.

## 2019-12-05 NOTE — Assessment & Plan Note (Signed)
An individual care plan for diabetes was established and reinforced today.  The patient's status was assessed using clinical findings on exam, labs and diagnostic testing. Patient success at meeting goals based on disease specific evidence-based guidelines and found to be good controlled. Medications were assessed and patient's understanding of the medical issues , including barriers were assessed. Recommend adherence to a diabetic diet, a graduated exercise program, HgbA1c level is checked quarterly, and urine microalbumin performed yearly .  Annual mono-filament sensation testing performed. Lower blood pressure and control hyperlipidemia is important. Get annual eye exams and annual flu shots and smoking cessation discussed.  Self management goals were discussed. 

## 2019-12-05 NOTE — Progress Notes (Signed)
Established Patient Office Visit  Subjective:  Patient ID: [name], male    DOB: 1940-08-28  Age: 79 y.o. MRN: [id number]  CC:  Chief Complaint  Patient presents with  . Diabetes  . COPD  . Hyperlipidemia    HPI [name] presents for chronic visit  Patient present with type 2 diabetes.  Specifically, this is type 2, non insulin requiring diabetes, complicated by peripheral vascular disease.  Compliance with treatment has been good; patient take medicines as directed, maintains diet and exercise regimen, follows up as directed, and is keeping glucose diary.  Date of  diagnosis 2010.  Depression screen has been performed.Tobacco screen nonsmoker. Current medicines for diabetes metformin, glipizide.  Patient is on lisinopril for renal protection and zetia for cholesterol control.  Patient performs foot exams daily and last ophthalmologic exam was march 2021.  Patient presents with diagnosis of COPD.  It is not secondary to prolonged asthma.  Diagnosis 2010  Treatment includes none.  The diagnosis has not been hospitalized for this diagnosis. Last na.  Patient is compliant with regular use of medicines.  Patient presents with hyperlipidemia.  Compliance with treatment has been good; patient takes medicines as directed, maintains low cholesterol diet, follows up as directed, and maintains exercise regimen.  Patient is using zetia without problems.  CORONARY ARTERY DISEASE  Patient presents in follow up of CAD. Patient was diagnosed in 2003. The patient has no associated CHF. The patient is currently taking a beta blocker, statin, and aspirin. CAD was diagnosed 17 years ago.  Patient is having no angina. Patient has used no NTG.  Patient is followed by cardiology.  Patient had cabg 2003 and stents 2007t angiography was 2007, last echocardiogram last year.  Past Medical History:  Diagnosis Date  . Atherosclerotic heart disease of native coronary artery without angina pectoris  02/12/2010   Qualifier: Diagnosis of  By: Marland Mcalpine    . Benign prostatic hyperplasia with lower urinary tract symptoms 12/04/2019  . CAD (coronary artery disease)    Hx of   . Cellulitis 03/19/2012  . Chronic obstructive pulmonary disease ([place]) 12/04/2019  . Diverticulosis of colon (without mention of hemorrhage) 2011   Colonoscopy   . Hiatal hernia 2011   EGD  . Hypertension   . Hypertensive heart disease without heart failure 12/04/2019  . ITP (idiopathic thrombocytopenic purpura)    He has been diagnosed with  . Pancytopenia   . Thrombocytopenia, unspecified ([place]) 01/24/2013  . TRANSAMINASES, SERUM, ELEVATED 02/12/2010   Qualifier: Diagnosis of  By: Marland Mcalpine    . Type 2 diabetes mellitus with diabetic peripheral angiopathy without gangrene ([place]) 12/04/2019    Past Surgical History:  Procedure Laterality Date  . CARDIAC CATHETERIZATION     His last heart catheterization in May of 2009 reveals a patent LIMA  . CORONARY ANGIOPLASTY     Successful percutaneous transluminal coronary angioplasty of the left circumflex obuse marginal vessel  . CORONARY ANGIOPLASTY WITH STENT PLACEMENT    . CORONARY ARTERY BYPASS GRAFT     x4 with a left internal mammary artery anastomosis to the left anterior descending coronary artery   . CORONARY STENT PLACEMENT     successful percutaneous transluminal coronary angioplasty and stenting of the left main coronary artery  . HERNIA REPAIR    . SAPHENOUS VEIN GRAFT RESECTION     graft to the first obtuse marginal, a saphenous vein graft to the first diagonal coronary artery  and a  saphenous vein graft to the distal right coronary artery  . US ECHOCARDIOGRAPHY  02-08-2007   Est. EF 40-45%    Family History  Problem Relation Age of Onset  . Cerebrovascular Accident Mother   . Colon cancer Neg Hx     Social History   Socioeconomic History  . Marital status: Married    Spouse name: Not on file  . Number of children: 3  . Years of  education: Not on file  . Highest education level: Not on file  Occupational History  . Occupation: Retired   Tobacco Use  . Smoking status: Never Smoker  . Smokeless tobacco: Never Used  Substance and Sexual Activity  . Alcohol use: No  . Drug use: No  . Sexual activity: Not on file  Other Topics Concern  . Not on file  Social History Narrative   Daily caffeine use    Social Determinants of Health   Financial Resource Strain:   . Difficulty of Paying Living Expenses:   Food Insecurity:   . Worried About Charity fundraiser in the Last Year:   . Arboriculturist in the Last Year:   Transportation Needs:   . Film/video editor (Medical):   Marland Kitchen Lack of Transportation (Non-Medical):   Physical Activity:   . Days of Exercise per Week:   . Minutes of Exercise per Session:   Stress:   . Feeling of Stress :   Social Connections:   . Frequency of Communication with Friends and Family:   . Frequency of Social Gatherings with Friends and Family:   . Attends Religious Services:   . Active Member of Clubs or Organizations:   . Attends Archivist Meetings:   Marland Kitchen Marital Status:   Intimate Partner Violence:   . Fear of Current or Ex-Partner:   . Emotionally Abused:   Marland Kitchen Physically Abused:   . Sexually Abused:     Outpatient Medications Prior to Visit  Medication Sig Dispense Refill  . cholecalciferol (VITAMIN D3) 25 MCG (1000 UT) tablet Take 5,000 Units by mouth daily.    . clopidogrel (PLAVIX) 75 MG tablet Take 1 tablet (75 mg total) by mouth daily. 90 tablet 3  . ezetimibe (ZETIA) 10 MG tablet Take 1 tablet (10 mg total) by mouth daily. 90 tablet 3  . glipiZIDE (GLUCOTROL XL) 5 MG 24 hr tablet Take 5 mg by mouth daily with breakfast.    . glucose blood (ONETOUCH ULTRA) test strip 1 each by Other route as needed for other. Use as instructed 100 strip 4  . isosorbide mononitrate (IMDUR) 30 MG 24 hr tablet Take 1 tablet (30 mg total) by mouth daily. 90 tablet 3  .  Lancets MISC 1 each by Does not apply route daily as needed. Please provide lancets that fit patients lancet device 100 each 4  . lisinopril (PRINIVIL,ZESTRIL) 5 MG tablet Take 5 mg by mouth daily.    . metFORMIN (GLUCOPHAGE) 500 MG tablet Take 500 mg by mouth 3 (three) times daily.     . metoprolol tartrate (LOPRESSOR) 25 MG tablet Take 12.5 mg by mouth daily.    . Multiple Vitamin (MULTIVITAMIN) tablet Take 1 tablet by mouth daily.      . nitroGLYCERIN (NITROSTAT) 0.4 MG SL tablet Place 1 tablet (0.4 mg total) under the tongue every 5 (five) minutes as needed. 25 tablet 6  . albuterol (VENTOLIN HFA) 108 (90 Base) MCG/ACT inhaler Inhale into the lungs every 6 (six) hours  as needed for wheezing or shortness of breath.     No facility-administered medications prior to visit.    Allergies  Allergen Reactions  . Lipitor [Atorvastatin Calcium] Other (See Comments)    Causes memory loss  . Crestor [Rosuvastatin Calcium] Other (See Comments)    Causes Muscle Pain  . Zocor [Simvastatin] Other (See Comments)    Causes muscle pain    ROS Review of Systems  Constitutional: Negative.   HENT: Negative.   Eyes: Negative.   Respiratory: Negative.   Cardiovascular: Negative.   Gastrointestinal: Negative.   Endocrine: Negative.   Genitourinary: Negative.   Musculoskeletal: Positive for arthralgias.  Skin: Negative.   Neurological: Negative.   Psychiatric/Behavioral: Negative.       Objective:    Physical Exam  Constitutional: He is oriented to person, place, and time. He appears well-developed and well-nourished.  Cardiovascular: Normal rate, regular rhythm, normal heart sounds and intact distal pulses.  Pulmonary/Chest: Breath sounds normal.  Abdominal: Soft.  Musculoskeletal:     Comments: Bunions and hammer toes  Neurological: He is oriented to person, place, and time. He has normal reflexes.  Decrease sensation feet  Skin: Skin is warm and dry.  Psychiatric: He has a normal mood  and affect. His behavior is normal.  Vitals reviewed. PFT: FVC 130%, Fev1 110%, FEV1/FVC 84%, PEF 52% mild obstructive disease  BP 118/70   Pulse 72   Temp (!) 97 F (36.1 C)   Resp 16   Ht 5' 9"  (1.753 m)   Wt 154 lb 6.4 oz (70 kg)   SpO2 98%   BMI 22.80 kg/m  Wt Readings from Last 3 Encounters:  12/05/19 154 lb 6.4 oz (70 kg)  11/15/19 157 lb (71.2 kg)  07/20/19 157 lb (71.2 kg)     Health Maintenance Due  Topic Date Due  . PNA vac Low Risk Adult (2 of 2 - PCV13) 03/15/2016  . HEMOGLOBIN A1C  10/03/2019    There are no preventive care reminders to display for this patient.  Lab Results  Component Value Date   TSH 2.12 09/29/2011   Lab Results  Component Value Date   WBC 5.7 07/20/2019   HGB 14.7 07/20/2019   HCT 41.9 07/20/2019   MCV 95.7 07/20/2019   PLT 126 (L) 07/20/2019   Lab Results  Component Value Date   NA 139 08/09/2019   K 4.3 08/09/2019   CHLORIDE 103 07/14/2016   CO2 23 08/09/2019   GLUCOSE 114 (H) 08/09/2019   BUN 8 08/09/2019   CREATININE 0.85 08/09/2019   BILITOT 1.0 08/09/2019   ALKPHOS 45 08/09/2019   AST 19 08/09/2019   ALT 13 08/09/2019   PROT 6.7 08/09/2019   ALBUMIN 4.0 08/09/2019   CALCIUM 9.5 08/09/2019   ANIONGAP 8 07/20/2019   EGFR 86 (L) 07/14/2016   Lab Results  Component Value Date   CHOL 173 08/09/2019   Lab Results  Component Value Date   HDL 74 08/09/2019   Lab Results  Component Value Date   LDLCALC 83 08/09/2019   Lab Results  Component Value Date   TRIG 91 08/09/2019   Lab Results  Component Value Date   CHOLHDL 2.3 08/09/2019   No results found for: HGBA1C    Assessment & Plan:   Problem List Items Addressed This Visit      Cardiovascular and Mediastinum   Benign essential HTN    An individual hypertension care plan was established and reinforced today.  The patient's  status was assessed using clinical findings on exam and labs or diagnostic tests. The patient's success at meeting treatment  goals on disease specific evidence-based guidelines and found to be well controlled. SELF MANAGEMENT: The patient and I together assessed ways to personally work towards obtaining the recommended goals. RECOMMENDATIONS: avoid decongestants found in common cold remedies, decrease consumption of alcohol, perform routine monitoring of BP with home BP cuff, exercise, reduction of dietary salt, take medicines as prescribed, try not to miss doses and quit smoking.  Regular exercise and maintaining a healthy weight is needed.  Stress reduction may help. A CLINICAL SUMMARY including written plan identify barriers to care unique to individual due to social or financial issues.  We attempt to mutually creat solutions for individual and family understanding.      Type 2 diabetes mellitus with diabetic peripheral angiopathy without gangrene [facility])    An individual care plan for diabetes was established and reinforced today.  The patient's status was assessed using clinical findings on exam, labs and diagnostic testing. Patient success at meeting goals based on disease specific evidence-based guidelines and found to be good controlled. Medications were assessed and patient's understanding of the medical issues , including barriers were assessed. Recommend adherence to a diabetic diet, a graduated exercise program, HgbA1c level is checked quarterly, and urine microalbumin performed yearly .  Annual mono-filament sensation testing performed. Lower blood pressure and control hyperlipidemia is important. Get annual eye exams and annual flu shots and smoking cessation discussed.  Self management goals were discussed.      Hypertensive heart disease without heart failure   Relevant Orders   CBC with Differential/Platelet   Comprehensive metabolic panel     Respiratory   Chronic obstructive pulmonary disease ([place])    An individualize plan was formulated for care of COPD.  Treatment is evidence based.  She will  continue on inhalers, avoid smoking and smoke.  Regular exercise with help with dyspnea. Routine follow ups and medication compliance is needed.PFT shows mild obstruction.      Relevant Orders   Pulmonary Function Test (Completed)     Endocrine   Diabetic polyneuropathy ([place])    An individual care plan for diabetes was established and reinforced today.  The patient's status was assessed using clinical findings on exam, labs and diagnostic testing. Patient success at meeting goals based on disease specific evidence-based guidelines and found to be good controlled. Medications were assessed and patient's understanding of the medical issues , including barriers were assessed. Recommend adherence to a diabetic diet, a graduated exercise program, HgbA1c level is checked quarterly, and urine microalbumin performed yearly .  Annual mono-filament sensation testing performed. Lower blood pressure and control hyperlipidemia is important. Get annual eye exams and annual flu shots and smoking cessation discussed.  Self management goals were discussed.        Genitourinary   Benign prostatic hyperplasia with lower urinary tract symptoms    He is having little problems with urine at present.        Other   Mixed hyperlipidemia - Primary    AN INDIVIDUAL CARE PLAN for hyperlipidemia/ cholesterol was established and reinforced today.  The patient's status was assessed using clinical findings on exam, lab and other diagnostic tests. The patient's disease status was assessed based on evidence-based guidelines and found to be well controlled. MEDICATIONS were rviewed. SELF MANAGEMENT GOALS have been discussed and patient's success at attaining the goal of low cholesterol was assessed. RECOMMENDATION given include regular exercise  3 days a week and low cholesterol/low fat diet. CLINICAL SUMMARY including written plan to identify barriers unique to the patient due to social or economic  reasons was discussed.       Relevant Orders   Lipid panel   BMI 22.0-22.9, adult    Supplement nutrition with protein/calorie supplement with meals to improve nutritional status.         No orders of the defined types were placed in this encounter.   Follow-up: Return in about 4 years (around 12/05/2023) for fasting.    Reinaldo Meeker, MD

## 2019-12-05 NOTE — Assessment & Plan Note (Signed)

## 2019-12-05 NOTE — Assessment & Plan Note (Signed)
An individualize plan was formulated for care of COPD.  Treatment is evidence based.  She will continue on inhalers, avoid smoking and smoke.  Regular exercise with help with dyspnea. Routine follow ups and medication compliance is needed.PFT shows mild obstruction.

## 2019-12-05 NOTE — Assessment & Plan Note (Signed)
Supplement nutrition with protein/calorie supplement with meals to improve nutritional status. 

## 2019-12-05 NOTE — Assessment & Plan Note (Signed)
He is having little problems with urine at present.

## 2019-12-06 LAB — COMPREHENSIVE METABOLIC PANEL
ALT: 14 IU/L (ref 0–44)
AST: 21 IU/L (ref 0–40)
Albumin/Globulin Ratio: 1.6 (ref 1.2–2.2)
Albumin: 4.2 g/dL (ref 3.6–4.6)
Alkaline Phosphatase: 40 IU/L — ABNORMAL LOW (ref 48–121)
BUN/Creatinine Ratio: 17 (ref 10–24)
BUN: 15 mg/dL (ref 8–27)
Bilirubin Total: 1.3 mg/dL — ABNORMAL HIGH (ref 0.0–1.2)
CO2: 23 mmol/L (ref 20–29)
Calcium: 9.8 mg/dL (ref 8.6–10.2)
Chloride: 103 mmol/L (ref 96–106)
Creatinine, Ser: 0.89 mg/dL (ref 0.76–1.27)
GFR calc Af Amer: 93 mL/min/{1.73_m2} (ref >59)
GFR calc non Af Amer: 80 mL/min/{1.73_m2} (ref >59)
Globulin, Total: 2.6 g/dL (ref 1.5–4.5)
Glucose: 114 mg/dL — ABNORMAL HIGH (ref 65–99)
Potassium: 4.3 mmol/L (ref 3.5–5.2)
Sodium: 140 mmol/L (ref 134–144)
Total Protein: 6.8 g/dL (ref 6.0–8.5)

## 2019-12-06 LAB — CBC WITH DIFFERENTIAL/PLATELET
Basophils Absolute: 0 10*3/uL (ref 0.0–0.2)
Basos: 1 %
EOS (ABSOLUTE): 0.2 10*3/uL (ref 0.0–0.4)
Eos: 3 %
Hematocrit: 39.6 % (ref 37.5–51.0)
Hemoglobin: 14.1 g/dL (ref 13.0–17.7)
Immature Grans (Abs): 0 10*3/uL (ref 0.0–0.1)
Immature Granulocytes: 0 %
Lymphocytes Absolute: 1.4 10*3/uL (ref 0.7–3.1)
Lymphs: 23 %
MCH: 33.1 pg — ABNORMAL HIGH (ref 26.6–33.0)
MCHC: 35.6 g/dL (ref 31.5–35.7)
MCV: 93 fL (ref 79–97)
Monocytes Absolute: 0.4 10*3/uL (ref 0.1–0.9)
Monocytes: 7 %
Neutrophils Absolute: 4 10*3/uL (ref 1.4–7.0)
Neutrophils: 66 %
Platelets: 134 10*3/uL — ABNORMAL LOW (ref 150–450)
RBC: 4.26 x10E6/uL (ref 4.14–5.80)
RDW: 12.1 % (ref 11.6–15.4)
WBC: 6 10*3/uL (ref 3.4–10.8)

## 2019-12-06 LAB — LIPID PANEL
Chol/HDL Ratio: 2.7 ratio (ref 0.0–5.0)
Cholesterol, Total: 180 mg/dL (ref 100–199)
HDL: 66 mg/dL (ref >39)
LDL Chol Calc (NIH): 96 mg/dL (ref 0–99)
Triglycerides: 99 mg/dL (ref 0–149)
VLDL Cholesterol Cal: 18 mg/dL (ref 5–40)

## 2019-12-06 LAB — HEMOGLOBIN A1C
Est. average glucose Bld gHb Est-mCnc: 108 mg/dL
Hgb A1c MFr Bld: 5.4 % (ref 4.8–5.6)

## 2019-12-06 LAB — CARDIOVASCULAR RISK ASSESSMENT

## 2019-12-06 NOTE — Progress Notes (Signed)
Stop glipizide BS good lp

## 2020-01-17 ENCOUNTER — Other Ambulatory Visit: Payer: Self-pay | Admitting: Legal Medicine

## 2020-01-17 DIAGNOSIS — R69 Illness, unspecified: Secondary | ICD-10-CM | POA: Diagnosis not present

## 2020-02-01 ENCOUNTER — Telehealth: Payer: Self-pay | Admitting: Cardiovascular Disease

## 2020-02-01 ENCOUNTER — Other Ambulatory Visit: Payer: Self-pay

## 2020-02-01 MED ORDER — ISOSORBIDE MONONITRATE ER 30 MG PO TB24: 30.0000 mg | ORAL_TABLET | Freq: Every day | ORAL | 2 refills | Status: DC

## 2020-02-01 MED ORDER — CLOPIDOGREL BISULFATE 75 MG PO TABS: 75.0000 mg | ORAL_TABLET | Freq: Every day | ORAL | 2 refills | Status: DC

## 2020-02-01 NOTE — Telephone Encounter (Signed)
Pt's medications were sent to pt's pharmacy as requested. Confirmation received.  

## 2020-02-01 NOTE — Telephone Encounter (Signed)
*  STAT* If patient is at the pharmacy, call can be transferred to refill team.   1. Which medications need to be refilled? (please list name of each medication and dose if known)  clopidogrel (PLAVIX) 75 MG tablet isosorbide mononitrate (IMDUR) 30 MG 24 hr tablet 2. Which pharmacy/location (including street and city if local pharmacy) is medication to be sent to? [facility] [place].  3. Do they need a 30 day or 90 day supply? 90 day supply

## 2020-02-19 ENCOUNTER — Other Ambulatory Visit: Payer: Self-pay | Admitting: Cardiovascular Disease

## 2020-04-08 ENCOUNTER — Ambulatory Visit: Payer: Medicare HMO | Admitting: Legal Medicine

## 2020-04-15 ENCOUNTER — Encounter: Payer: Self-pay | Admitting: Legal Medicine

## 2020-04-15 ENCOUNTER — Other Ambulatory Visit: Payer: Self-pay

## 2020-04-15 ENCOUNTER — Ambulatory Visit (INDEPENDENT_AMBULATORY_CARE_PROVIDER_SITE_OTHER): Payer: Medicare HMO | Admitting: Legal Medicine

## 2020-04-15 VITALS — BP 136/70 | HR 70 | Temp 97.6°F | Resp 16 | Ht 69.0 in | Wt 156.2 lb

## 2020-04-15 DIAGNOSIS — Z23 Encounter for immunization: Secondary | ICD-10-CM | POA: Diagnosis not present

## 2020-04-15 DIAGNOSIS — J449 Chronic obstructive pulmonary disease, unspecified: Secondary | ICD-10-CM

## 2020-04-15 DIAGNOSIS — I5032 Chronic diastolic (congestive) heart failure: Secondary | ICD-10-CM | POA: Diagnosis not present

## 2020-04-15 DIAGNOSIS — I10 Essential (primary) hypertension: Secondary | ICD-10-CM

## 2020-04-15 DIAGNOSIS — E782 Mixed hyperlipidemia: Secondary | ICD-10-CM | POA: Diagnosis not present

## 2020-04-15 DIAGNOSIS — N401 Enlarged prostate with lower urinary tract symptoms: Secondary | ICD-10-CM | POA: Diagnosis not present

## 2020-04-15 DIAGNOSIS — E1151 Type 2 diabetes mellitus with diabetic peripheral angiopathy without gangrene: Secondary | ICD-10-CM

## 2020-04-15 DIAGNOSIS — Z789 Other specified health status: Secondary | ICD-10-CM | POA: Diagnosis not present

## 2020-04-15 DIAGNOSIS — T466X5A Adverse effect of antihyperlipidemic and antiarteriosclerotic drugs, initial encounter: Secondary | ICD-10-CM | POA: Insufficient documentation

## 2020-04-15 DIAGNOSIS — I119 Hypertensive heart disease without heart failure: Secondary | ICD-10-CM

## 2020-04-15 MED ORDER — METOPROLOL TARTRATE 25 MG PO TABS: 12.5000 mg | ORAL_TABLET | Freq: Every day | ORAL | 3 refills | Status: DC

## 2020-04-15 MED ORDER — METFORMIN HCL 500 MG PO TABS
500.0000 mg | ORAL_TABLET | Freq: Three times a day (TID) | ORAL | 2 refills | Status: DC
Start: 2020-04-15 — End: 2021-01-06

## 2020-04-15 MED ORDER — LISINOPRIL 5 MG PO TABS
5.0000 mg | ORAL_TABLET | Freq: Every day | ORAL | 2 refills | Status: DC
Start: 2020-04-15 — End: 2021-01-06

## 2020-04-15 NOTE — Progress Notes (Signed)
Subjective:  Patient ID: [name], male    DOB: 1940-09-14  Age: 79 y.o. MRN: [id number]  Chief Complaint  Patient presents with  . Diabetes  . Hyperlipidemia    HPI: chronic visit  Patient present with type 2 diabetes.  Specifically, this is type 2, noninsulin requiring diabetes, complicated by neuropathy.  Compliance with treatment has been good; patient take medicines as directed, maintains diet and exercise regimen, follows up as directed, and is keeping glucose diary.  Date of  diagnosis 2010.  Depression screen has been performed.Tobacco screen nonsmoker. Current medicines for diabetes glipizide, metformin.  Patient is on lisinopril for renal protection and zetia for cholesterol control.  Patient performs foot exams daily and last ophthalmologic exam was yes.  Patient presents with hyperlipidemia.  Compliance with treatment has been good; patient takes medicines as directed, maintains low cholesterol diet, follows up as directed, and maintains exercise regimen.  Patient is using pravastatin without problems.   Current Outpatient Medications on File Prior to Visit  Medication Sig Dispense Refill  . cholecalciferol (VITAMIN D3) 25 MCG (1000 UT) tablet Take 5,000 Units by mouth daily.    . clopidogrel (PLAVIX) 75 MG tablet Take 1 tablet (75 mg total) by mouth daily. 90 tablet 2  . ezetimibe (ZETIA) 10 MG tablet TAKE 1 TABLET BY MOUTH DAILY 90 tablet 2  . glucose blood (ONETOUCH ULTRA) test strip 1 each by Other route as needed for other. Use as instructed 100 strip 4  . isosorbide mononitrate (IMDUR) 30 MG 24 hr tablet Take 1 tablet (30 mg total) by mouth daily. 90 tablet 2  . Lancets MISC 1 each by Does not apply route daily as needed. Please provide lancets that fit patients lancet device 100 each 4  . Multiple Vitamin (MULTIVITAMIN) tablet Take 1 tablet by mouth daily.      . nitroGLYCERIN (NITROSTAT) 0.4 MG SL tablet Place 1 tablet (0.4 mg total) under the tongue every 5  (five) minutes as needed. 25 tablet 6   No current facility-administered medications on file prior to visit.   Past Medical History:  Diagnosis Date  . Atherosclerotic heart disease of native coronary artery without angina pectoris 02/12/2010   Qualifier: Diagnosis of  By: Marland Mcalpine    . Benign prostatic hyperplasia with lower urinary tract symptoms 12/04/2019  . CAD (coronary artery disease)    Hx of   . Cellulitis 03/19/2012  . Chronic obstructive pulmonary disease ([place]) 12/04/2019  . Diverticulosis of colon (without mention of hemorrhage) 2011   Colonoscopy   . Hiatal hernia 2011   EGD  . Hypertension   . Hypertensive heart disease without heart failure 12/04/2019  . ITP (idiopathic thrombocytopenic purpura)    He has been diagnosed with  . Pancytopenia   . Thrombocytopenia, unspecified ([place]) 01/24/2013  . TRANSAMINASES, SERUM, ELEVATED 02/12/2010   Qualifier: Diagnosis of  By: Marland Mcalpine    . Type 2 diabetes mellitus with diabetic peripheral angiopathy without gangrene ([place]) 12/04/2019   Past Surgical History:  Procedure Laterality Date  . CARDIAC CATHETERIZATION     His last heart catheterization in May of 2009 reveals a patent LIMA  . CORONARY ANGIOPLASTY     Successful percutaneous transluminal coronary angioplasty of the left circumflex obuse marginal vessel  . CORONARY ANGIOPLASTY WITH STENT PLACEMENT    . CORONARY ARTERY BYPASS GRAFT     x4 with a left internal mammary artery anastomosis to the left anterior descending coronary artery   .  CORONARY STENT PLACEMENT     successful percutaneous transluminal coronary angioplasty and stenting of the left main coronary artery  . HERNIA REPAIR    . SAPHENOUS VEIN GRAFT RESECTION     graft to the first obtuse marginal, a saphenous vein graft to the first diagonal coronary artery  and a saphenous vein graft to the distal right coronary artery  . US ECHOCARDIOGRAPHY  02-08-2007   Est. EF 40-45%    Family History    Problem Relation Age of Onset  . Cerebrovascular Accident Mother   . Colon cancer Neg Hx    Social History   Socioeconomic History  . Marital status: Married    Spouse name: Not on file  . Number of children: 3  . Years of education: Not on file  . Highest education level: Not on file  Occupational History  . Occupation: Retired   Tobacco Use  . Smoking status: Never Smoker  . Smokeless tobacco: Never Used  Vaping Use  . Vaping Use: Never used  Substance and Sexual Activity  . Alcohol use: No  . Drug use: No  . Sexual activity: Not Currently  Other Topics Concern  . Not on file  Social History Narrative   Daily caffeine use    Social Determinants of Health   Financial Resource Strain:   . Difficulty of Paying Living Expenses: Not on file  Food Insecurity:   . Worried About Charity fundraiser in the Last Year: Not on file  . Ran Out of Food in the Last Year: Not on file  Transportation Needs:   . Lack of Transportation (Medical): Not on file  . Lack of Transportation (Non-Medical): Not on file  Physical Activity:   . Days of Exercise per Week: Not on file  . Minutes of Exercise per Session: Not on file  Stress:   . Feeling of Stress : Not on file  Social Connections:   . Frequency of Communication with Friends and Family: Not on file  . Frequency of Social Gatherings with Friends and Family: Not on file  . Attends Religious Services: Not on file  . Active Member of Clubs or Organizations: Not on file  . Attends Archivist Meetings: Not on file  . Marital Status: Not on file    Review of Systems  Constitutional: Negative.   HENT: Negative.   Eyes: Positive for visual disturbance.  Respiratory: Negative.   Cardiovascular: Negative.   Gastrointestinal: Negative.   Endocrine: Negative.   Genitourinary: Negative.   Musculoskeletal: Negative.   Skin: Negative.   Neurological: Negative.   Psychiatric/Behavioral: Negative.      Objective:  BP  136/70   Pulse 70   Temp 97.6 F (36.4 C)   Resp 16   Ht 5\' 9"  (1.753 m)   Wt 156 lb 3.2 oz (70.9 kg)   BMI 23.07 kg/m   BP/Weight 04/15/2020 01/13/5008 09/18/1827  Systolic BP 937 169 678  Diastolic BP 70 70 64  Wt. (Lbs) 156.2 154.4 157  BMI 23.07 22.8 23.18    Physical Exam Vitals reviewed.  HENT:     Head: Normocephalic.     Right Ear: Tympanic membrane normal.     Left Ear: Tympanic membrane normal.     Nose: Nose normal.     Mouth/Throat:     Mouth: Mucous membranes are moist.     Pharynx: Oropharynx is clear.  Eyes:     Extraocular Movements: Extraocular movements intact.  Conjunctiva/sclera: Conjunctivae normal.     Pupils: Pupils are equal, round, and reactive to light.  Cardiovascular:     Rate and Rhythm: Normal rate and regular rhythm.     Pulses: Normal pulses.     Heart sounds: Normal heart sounds.  Pulmonary:     Effort: Pulmonary effort is normal.  Abdominal:     General: Abdomen is flat. Bowel sounds are normal.     Palpations: Abdomen is soft.  Musculoskeletal:     Cervical back: Normal range of motion and neck supple.  Skin:    General: Skin is warm and dry.     Capillary Refill: Capillary refill takes less than 2 seconds.  Neurological:     General: No focal deficit present.     Mental Status: He is alert and oriented to person, place, and time. Mental status is at baseline.  Psychiatric:        Mood and Affect: Mood normal.        Thought Content: Thought content normal.     Diabetic Foot Exam - Simple   Simple Foot Form Diabetic Foot exam was performed with the following findings: Yes 04/15/2020  8:58 AM  Visual Inspection See comments: Yes Sensation Testing See comments: Yes Pulse Check Posterior Tibialis and Dorsalis pulse intact bilaterally: Yes Comments Hammer toes and bunions, no sensation feet      Lab Results  Component Value Date   WBC 6.0 12/05/2019   HGB 14.1 12/05/2019   HCT 39.6 12/05/2019   PLT 134 (L)  12/05/2019   GLUCOSE 114 (H) 12/05/2019   CHOL 180 12/05/2019   TRIG 99 12/05/2019   HDL 66 12/05/2019   LDLCALC 96 12/05/2019   ALT 14 12/05/2019   AST 21 12/05/2019   NA 140 12/05/2019   K 4.3 12/05/2019   CL 103 12/05/2019   CREATININE 0.89 12/05/2019   BUN 15 12/05/2019   CO2 23 12/05/2019   TSH 2.12 09/29/2011   INR 1.3 (H) 03/24/2011   HGBA1C 5.4 12/05/2019      Assessment & Plan:   1. Benign prostatic hyperplasia with lower urinary tract symptoms, symptom details unspecified - PSA AN INDIVIDUAL CARE PLAN for BPH was established and reinforced today.  The patient's status was assessed using clinical findings on exam, labs, and other diagnostic testing. Patient's success at meeting treatment goals based on disease specific evidence-bassed guidelines and found to be in good control. RECOMMENDATIONS include maintaining present medicines and treatment.  2. Type 2 diabetes mellitus with diabetic peripheral angiopathy without gangrene, without long-term current use of insulin (HCC) - Hemoglobin A1c An individual care plan for diabetes was established and reinforced today.  The patient's status was assessed using clinical findings on exam, labs and diagnostic testing. Patient success at meeting goals based on disease specific evidence-based guidelines and found to be good controlled. Medications were assessed and patient's understanding of the medical issues , including barriers were assessed. Recommend adherence to a diabetic diet, a graduated exercise program, HgbA1c level is checked quarterly, and urine microalbumin performed yearly .  Annual mono-filament sensation testing performed. Lower blood pressure and control hyperlipidemia is important. Get annual eye exams and annual flu shots and smoking cessation discussed.  Self management goals were discussed.  3. Hypertensive heart disease without heart failure An individualized care plan was established and reinforced.  The  patient's disease status was assessed using clinical finding son exam today, labs, and/or other diagnostic testing such as x-rays, to determine the patient's  success in meeting treatmentgoalsbased on disease-based guidelines and found to beimproving. But not at goal yet. Medications prescriptions no changes Laboratory tests ordered to be performed today include routine. RECOMMENDATIONS: given include see cardiology.  Call physician is patient gains 3 lbs in one day or 5 lbs for one week.  Call for progressive PND, orthopnea or increased pedal edema.  4. Chronic obstructive pulmonary disease, unspecified COPD type ([place]) An individualize plan was formulated for care of COPD.  Treatment is evidence based.  She will continue on inhalers, avoid smoking and smoke.  Regular exercise with help with dyspnea. Routine follow ups and medication compliance is needed.  5. Mixed hyperlipidemia - Lipid panel - TSH AN INDIVIDUAL CARE PLAN for hyperlipidemia/ cholesterol was established and reinforced today.  The patient's status was assessed using clinical findings on exam, lab and other diagnostic tests. The patient's disease status was assessed based on evidence-based guidelines and found to be well controlled. MEDICATIONS were reviewed. SELF MANAGEMENT GOALS have been discussed and patient's success at attaining the goal of low cholesterol was assessed. RECOMMENDATION given include regular exercise 3 days a week and low cholesterol/low fat diet. CLINICAL SUMMARY including written plan to identify barriers unique to the patient due to social or economic  reasons was discussed.  6. Benign essential HTN - Comprehensive metabolic panel - CBC with Differential/Platelet An individual hypertension care plan was established and reinforced today.  The patient's status was assessed using clinical findings on exam and labs or diagnostic tests. The patient's success at meeting treatment goals on disease specific  evidence-based guidelines and found to be well controlled. SELF MANAGEMENT: The patient and I together assessed ways to personally work towards obtaining the recommended goals. RECOMMENDATIONS: avoid decongestants found in common cold remedies, decrease consumption of alcohol, perform routine monitoring of BP with home BP cuff, exercise, reduction of dietary salt, take medicines as prescribed, try not to miss doses and quit smoking.  Regular exercise and maintaining a healthy weight is needed.  Stress reduction may help. A CLINICAL SUMMARY including written plan identify barriers to care unique to individual due to social or financial issues.  We attempt to mutually creat solutions for individual and family understanding.  7. Need for immunization against influenza - Flu Vaccine QUAD High Dose(Fluad)  8. Chronic diastolic congestive heart failure [facility]) An individualized care plan was established and reinforced.  The patient's disease status was assessed using clinical finding son exam today, labs, and/or other diagnostic testing such as x-rays, to determine the patient's success in meeting treatmentgoalsbased on disease-based guidelines and found to be improving. But not at goal yet. Medications prescriptions no changes Laboratory tests ordered to be performed today include routine. RECOMMENDATIONS: given include see cardiology.  Call physician is patient gains 3 lbs in one day or 5 lbs for one week.  Call for progressive PND, orthopnea or increased pedal edema.  9. Statin intolerance Unable to tolerate statins      Orders Placed This Encounter  Procedures  . Flu Vaccine QUAD High Dose(Fluad)  . Comprehensive metabolic panel  . Hemoglobin A1c  . Lipid panel  . TSH  . CBC with Differential/Platelet  . PSA     Follow-up: Return in about 4 years (around 04/15/2024) for fasting.  An After Visit Summary was printed and given to the patient.  [place] [phone]

## 2020-04-16 DIAGNOSIS — R69 Illness, unspecified: Secondary | ICD-10-CM | POA: Diagnosis not present

## 2020-04-16 LAB — COMPREHENSIVE METABOLIC PANEL
ALT: 15 IU/L (ref 0–44)
AST: 22 IU/L (ref 0–40)
Albumin/Globulin Ratio: 1.7 (ref 1.2–2.2)
Albumin: 4.3 g/dL (ref 3.6–4.6)
Alkaline Phosphatase: 41 IU/L — ABNORMAL LOW (ref 44–121)
BUN/Creatinine Ratio: 14 (ref 10–24)
BUN: 13 mg/dL (ref 8–27)
Bilirubin Total: 1.1 mg/dL (ref 0.0–1.2)
CO2: 24 mmol/L (ref 20–29)
Calcium: 9.8 mg/dL (ref 8.6–10.2)
Chloride: 102 mmol/L (ref 96–106)
Creatinine, Ser: 0.93 mg/dL (ref 0.76–1.27)
GFR calc Af Amer: 89 mL/min/{1.73_m2} (ref >59)
GFR calc non Af Amer: 77 mL/min/{1.73_m2} (ref >59)
Globulin, Total: 2.6 g/dL (ref 1.5–4.5)
Glucose: 143 mg/dL — ABNORMAL HIGH (ref 65–99)
Potassium: 4.4 mmol/L (ref 3.5–5.2)
Sodium: 140 mmol/L (ref 134–144)
Total Protein: 6.9 g/dL (ref 6.0–8.5)

## 2020-04-16 LAB — LIPID PANEL
Chol/HDL Ratio: 2.8 ratio (ref 0.0–5.0)
Cholesterol, Total: 177 mg/dL (ref 100–199)
HDL: 63 mg/dL (ref >39)
LDL Chol Calc (NIH): 101 mg/dL — ABNORMAL HIGH (ref 0–99)
Triglycerides: 67 mg/dL (ref 0–149)
VLDL Cholesterol Cal: 13 mg/dL (ref 5–40)

## 2020-04-16 LAB — CBC WITH DIFFERENTIAL/PLATELET
Basophils Absolute: 0 10*3/uL (ref 0.0–0.2)
Basos: 1 %
EOS (ABSOLUTE): 0.1 10*3/uL (ref 0.0–0.4)
Eos: 2 %
Hematocrit: 41.3 % (ref 37.5–51.0)
Hemoglobin: 14 g/dL (ref 13.0–17.7)
Immature Grans (Abs): 0 10*3/uL (ref 0.0–0.1)
Immature Granulocytes: 0 %
Lymphocytes Absolute: 1.2 10*3/uL (ref 0.7–3.1)
Lymphs: 21 %
MCH: 32.6 pg (ref 26.6–33.0)
MCHC: 33.9 g/dL (ref 31.5–35.7)
MCV: 96 fL (ref 79–97)
Monocytes Absolute: 0.5 10*3/uL (ref 0.1–0.9)
Monocytes: 9 %
Neutrophils Absolute: 3.8 10*3/uL (ref 1.4–7.0)
Neutrophils: 67 %
Platelets: 134 10*3/uL — ABNORMAL LOW (ref 150–450)
RBC: 4.29 x10E6/uL (ref 4.14–5.80)
RDW: 12.6 % (ref 11.6–15.4)
WBC: 5.7 10*3/uL (ref 3.4–10.8)

## 2020-04-16 LAB — TSH: TSH: 2.45 u[IU]/mL (ref 0.450–4.500)

## 2020-04-16 LAB — CARDIOVASCULAR RISK ASSESSMENT

## 2020-04-16 LAB — HEMOGLOBIN A1C
Est. average glucose Bld gHb Est-mCnc: 134 mg/dL
Hgb A1c MFr Bld: 6.3 % — ABNORMAL HIGH (ref 4.8–5.6)

## 2020-04-16 LAB — PSA: Prostate Specific Ag, Serum: 3 ng/mL (ref 0.0–4.0)

## 2020-04-16 NOTE — Progress Notes (Signed)
Glucose 143, kidney tests normal, liver tests normal, A1c 6.3, LDL-c cholesterol 101, TSH 2.45 normal, CBC normal, PSA 3.0 normal lp

## 2020-04-18 ENCOUNTER — Telehealth: Payer: Self-pay

## 2020-04-18 ENCOUNTER — Other Ambulatory Visit: Payer: Self-pay | Admitting: Legal Medicine

## 2020-04-18 NOTE — Telephone Encounter (Signed)
I gave the bloodwork results to patient. Dr Henrene Pastor recommended to stop glipizide.

## 2020-05-30 ENCOUNTER — Ambulatory Visit (INDEPENDENT_AMBULATORY_CARE_PROVIDER_SITE_OTHER): Payer: Medicare HMO

## 2020-05-30 ENCOUNTER — Other Ambulatory Visit: Payer: Self-pay

## 2020-05-30 DIAGNOSIS — Z23 Encounter for immunization: Secondary | ICD-10-CM

## 2020-05-30 NOTE — Progress Notes (Signed)
   Covid-19 Vaccination Clinic  Name:  [name]    MRN: [id number] DOB: 1941/04/14  05/30/2020  [name] was observed post Covid-19 immunization for 15 minutes without incident. He was provided with Vaccine Information Sheet and instruction to access the V-Safe system.   [name] was instructed to call 911 with any severe reactions post vaccine: Marland Kitchen Difficulty breathing  . Swelling of face and throat  . A fast heartbeat  . A bad rash all over body  . Dizziness and weakness   Immunizations Administered    Name Date Dose VIS Date Route   Pfizer COVID-19 Vaccine 05/30/2020 10:30 AM 0.3 mL 05/01/2020 Intramuscular   Manufacturer: [place]   Lot: [id number]   NDC: [address]-2

## 2020-07-03 DIAGNOSIS — E119 Type 2 diabetes mellitus without complications: Secondary | ICD-10-CM | POA: Diagnosis not present

## 2020-07-03 DIAGNOSIS — Z7984 Long term (current) use of oral hypoglycemic drugs: Secondary | ICD-10-CM | POA: Diagnosis not present

## 2020-07-03 DIAGNOSIS — H353110 Nonexudative age-related macular degeneration, right eye, stage unspecified: Secondary | ICD-10-CM | POA: Diagnosis not present

## 2020-07-03 DIAGNOSIS — H524 Presbyopia: Secondary | ICD-10-CM | POA: Diagnosis not present

## 2020-07-03 DIAGNOSIS — Z961 Presence of intraocular lens: Secondary | ICD-10-CM | POA: Diagnosis not present

## 2020-07-03 DIAGNOSIS — H47211 Primary optic atrophy, right eye: Secondary | ICD-10-CM | POA: Diagnosis not present

## 2020-07-03 DIAGNOSIS — H43393 Other vitreous opacities, bilateral: Secondary | ICD-10-CM | POA: Diagnosis not present

## 2020-07-03 LAB — HM DIABETES EYE EXAM

## 2020-07-22 ENCOUNTER — Inpatient Hospital Stay: Payer: Medicare HMO | Attending: Internal Medicine | Admitting: Internal Medicine

## 2020-07-22 ENCOUNTER — Other Ambulatory Visit: Payer: Self-pay

## 2020-07-22 ENCOUNTER — Inpatient Hospital Stay: Payer: Medicare HMO

## 2020-07-22 ENCOUNTER — Encounter: Payer: Self-pay | Admitting: Internal Medicine

## 2020-07-22 ENCOUNTER — Telehealth: Payer: Self-pay | Admitting: Internal Medicine

## 2020-07-22 VITALS — BP 126/68 | HR 72 | Temp 97.9°F | Resp 15 | Ht 69.0 in | Wt 158.6 lb

## 2020-07-22 DIAGNOSIS — D696 Thrombocytopenia, unspecified: Secondary | ICD-10-CM

## 2020-07-22 DIAGNOSIS — Z7984 Long term (current) use of oral hypoglycemic drugs: Secondary | ICD-10-CM | POA: Insufficient documentation

## 2020-07-22 DIAGNOSIS — D693 Immune thrombocytopenic purpura: Secondary | ICD-10-CM

## 2020-07-22 DIAGNOSIS — E1151 Type 2 diabetes mellitus with diabetic peripheral angiopathy without gangrene: Secondary | ICD-10-CM | POA: Insufficient documentation

## 2020-07-22 DIAGNOSIS — I2510 Atherosclerotic heart disease of native coronary artery without angina pectoris: Secondary | ICD-10-CM | POA: Insufficient documentation

## 2020-07-22 DIAGNOSIS — Z79899 Other long term (current) drug therapy: Secondary | ICD-10-CM | POA: Insufficient documentation

## 2020-07-22 LAB — CBC WITH DIFFERENTIAL (CANCER CENTER ONLY)
Abs Immature Granulocytes: 0.02 10*3/uL (ref 0.00–0.07)
Basophils Absolute: 0 10*3/uL (ref 0.0–0.1)
Basophils Relative: 1 %
Eosinophils Absolute: 0.1 10*3/uL (ref 0.0–0.5)
Eosinophils Relative: 2 %
HCT: 39.3 % (ref 39.0–52.0)
Hemoglobin: 13.9 g/dL (ref 13.0–17.0)
Immature Granulocytes: 0 %
Lymphocytes Relative: 23 %
Lymphs Abs: 1.4 10*3/uL (ref 0.7–4.0)
MCH: 33.1 pg (ref 26.0–34.0)
MCHC: 35.4 g/dL (ref 30.0–36.0)
MCV: 93.6 fL (ref 80.0–100.0)
Monocytes Absolute: 0.5 10*3/uL (ref 0.1–1.0)
Monocytes Relative: 8 %
Neutro Abs: 3.9 10*3/uL (ref 1.7–7.7)
Neutrophils Relative %: 66 %
Platelet Count: 139 10*3/uL — ABNORMAL LOW (ref 150–400)
RBC: 4.2 MIL/uL — ABNORMAL LOW (ref 4.22–5.81)
RDW: 12.4 % (ref 11.5–15.5)
WBC Count: 5.8 10*3/uL (ref 4.0–10.5)
nRBC: 0 % (ref 0.0–0.2)

## 2020-07-22 LAB — CMP (CANCER CENTER ONLY)
ALT: 17 U/L (ref 0–44)
AST: 22 U/L (ref 15–41)
Albumin: 3.8 g/dL (ref 3.5–5.0)
Alkaline Phosphatase: 39 U/L (ref 38–126)
Anion gap: 7 (ref 5–15)
BUN: 12 mg/dL (ref 8–23)
CO2: 28 mmol/L (ref 22–32)
Calcium: 9.8 mg/dL (ref 8.9–10.3)
Chloride: 102 mmol/L (ref 98–111)
Creatinine: 0.95 mg/dL (ref 0.61–1.24)
GFR, Estimated: >60 mL/min (ref >60)
Glucose, Bld: 153 mg/dL — ABNORMAL HIGH (ref 70–99)
Potassium: 4.1 mmol/L (ref 3.5–5.1)
Sodium: 137 mmol/L (ref 135–145)
Total Bilirubin: 1.1 mg/dL (ref 0.3–1.2)
Total Protein: 7.3 g/dL (ref 6.5–8.1)

## 2020-07-22 LAB — LACTATE DEHYDROGENASE: LDH: 165 U/L (ref 98–192)

## 2020-07-22 NOTE — Telephone Encounter (Signed)
Scheduled appointments per 1/10 los. Spoke to patient who is aware of appointments date and times.  

## 2020-07-22 NOTE — Progress Notes (Signed)
[facility] Telephone:([phone]) [phone]   Fax:([phone]) [phone]  OFFICE PROGRESS NOTE  Lillard Anes, MD [place] [id number]  DIAGNOSIS: Idiopathic thrombocytopenic purpura   PRIOR THERAPY: None   CURRENT THERAPY: Observation.  INTERVAL HISTORY: [name] 80 y.o. male returns to the clinic today for annual follow-up visit accompanied by his wife.  The patient is feeling fine today with no concerning complaints.  He denied having any current chest pain, shortness of breath, cough or hemoptysis.  He denied having any bleeding, bruises or ecchymosis.  He has no nausea, vomiting, diarrhea or constipation.  He is here today for evaluation and repeat blood work.   MEDICAL HISTORY: Past Medical History:  Diagnosis Date  . Atherosclerotic heart disease of native coronary artery without angina pectoris 02/12/2010   Qualifier: Diagnosis of  By: Marland Mcalpine    . Benign prostatic hyperplasia with lower urinary tract symptoms 12/04/2019  . CAD (coronary artery disease)    Hx of   . Cellulitis 03/19/2012  . Chronic obstructive pulmonary disease ([place]) 12/04/2019  . Diverticulosis of colon (without mention of hemorrhage) 2011   Colonoscopy   . Hiatal hernia 2011   EGD  . Hypertension   . Hypertensive heart disease without heart failure 12/04/2019  . ITP (idiopathic thrombocytopenic purpura)    He has been diagnosed with  . Pancytopenia   . Thrombocytopenia, unspecified ([place]) 01/24/2013  . TRANSAMINASES, SERUM, ELEVATED 02/12/2010   Qualifier: Diagnosis of  By: Marland Mcalpine    . Type 2 diabetes mellitus with diabetic peripheral angiopathy without gangrene ([place]) 12/04/2019    ALLERGIES:  is allergic to lipitor [atorvastatin calcium], crestor [rosuvastatin calcium], and zocor [simvastatin].  MEDICATIONS:  Current Outpatient Medications  Medication Sig Dispense Refill  . cholecalciferol (VITAMIN D3) 25 MCG (1000 UT) tablet Take 5,000 Units by  mouth daily.    . clopidogrel (PLAVIX) 75 MG tablet Take 1 tablet (75 mg total) by mouth daily. 90 tablet 2  . ezetimibe (ZETIA) 10 MG tablet TAKE 1 TABLET BY MOUTH DAILY 90 tablet 2  . glucose blood (ONETOUCH ULTRA) test strip 1 each by Other route as needed for other. Use as instructed 100 strip 4  . isosorbide mononitrate (IMDUR) 30 MG 24 hr tablet Take 1 tablet (30 mg total) by mouth daily. 90 tablet 2  . Lancets MISC 1 each by Does not apply route daily as needed. Please provide lancets that fit patients lancet device 100 each 4  . lisinopril (ZESTRIL) 5 MG tablet Take 1 tablet (5 mg total) by mouth daily. 90 tablet 2  . metFORMIN (GLUCOPHAGE) 500 MG tablet Take 1 tablet (500 mg total) by mouth 3 (three) times daily. 270 tablet 2  . metoprolol tartrate (LOPRESSOR) 25 MG tablet Take 0.5 tablets (12.5 mg total) by mouth daily. 45 tablet 3  . Multiple Vitamin (MULTIVITAMIN) tablet Take 1 tablet by mouth daily.      . nitroGLYCERIN (NITROSTAT) 0.4 MG SL tablet Place 1 tablet (0.4 mg total) under the tongue every 5 (five) minutes as needed. 25 tablet 6   No current facility-administered medications for this visit.    SURGICAL HISTORY:  Past Surgical History:  Procedure Laterality Date  . CARDIAC CATHETERIZATION     His last heart catheterization in May of 2009 reveals a patent LIMA  . CORONARY ANGIOPLASTY     Successful percutaneous transluminal coronary angioplasty of the left circumflex obuse marginal vessel  .  CORONARY ANGIOPLASTY WITH STENT PLACEMENT    . CORONARY ARTERY BYPASS GRAFT     x4 with a left internal mammary artery anastomosis to the left anterior descending coronary artery   . CORONARY STENT PLACEMENT     successful percutaneous transluminal coronary angioplasty and stenting of the left main coronary artery  . HERNIA REPAIR    . SAPHENOUS VEIN GRAFT RESECTION     graft to the first obtuse marginal, a saphenous vein graft to the first diagonal coronary artery  and a  saphenous vein graft to the distal right coronary artery  . US ECHOCARDIOGRAPHY  02-08-2007   Est. EF 40-45%    REVIEW OF SYSTEMS:  A comprehensive review of systems was negative.   PHYSICAL EXAMINATION: General appearance: alert, cooperative and no distress Head: Normocephalic, without obvious abnormality, atraumatic Neck: no adenopathy Lymph nodes: Cervical, supraclavicular, and axillary nodes normal. Resp: clear to auscultation bilaterally Back: symmetric, no curvature. ROM normal. No CVA tenderness. Cardio: regular rate and rhythm, S1, S2 normal, no murmur, click, rub or gallop GI: soft, non-tender; bowel sounds normal; no masses,  no organomegaly Extremities: extremities normal, atraumatic, no cyanosis or edema  ECOG PERFORMANCE STATUS: 0 - Asymptomatic  Blood pressure 126/68, pulse 72, temperature 97.9 F (36.6 C), temperature source Tympanic, resp. rate 15, height 5\' 9"  (1.753 m), weight 158 lb 9.6 oz (71.9 kg), SpO2 100 %.  LABORATORY DATA: Lab Results  Component Value Date   WBC 5.8 07/22/2020   HGB 13.9 07/22/2020   HCT 39.3 07/22/2020   MCV 93.6 07/22/2020   PLT 139 (L) 07/22/2020      Chemistry      Component Value Date/Time   NA 140 04/15/2020 0907   NA 135 (L) 07/14/2016 0935   K 4.4 04/15/2020 0907   K 4.4 07/14/2016 0935   CL 102 04/15/2020 0907   CL 106 07/19/2012 1028   CO2 24 04/15/2020 0907   CO2 24 07/14/2016 0935   BUN 13 04/15/2020 0907   BUN 10.4 07/14/2016 0935   CREATININE 0.93 04/15/2020 0907   CREATININE 0.83 07/20/2019 0957   CREATININE 0.8 07/14/2016 0935      Component Value Date/Time   CALCIUM 9.8 04/15/2020 0907   CALCIUM 9.0 07/14/2016 0935   ALKPHOS 41 (L) 04/15/2020 0907   ALKPHOS 60 07/14/2016 0935   AST 22 04/15/2020 0907   AST 24 07/20/2019 0957   AST 31 07/14/2016 0935   ALT 15 04/15/2020 0907   ALT 17 07/20/2019 0957   ALT 33 07/14/2016 0935   BILITOT 1.1 04/15/2020 0907   BILITOT 1.0 07/20/2019 0957   BILITOT 1.67  (H) 07/14/2016 0935       RADIOGRAPHIC STUDIES: No results found.  ASSESSMENT AND PLAN:  This is a very pleasant 80 years old white male with idiopathic thrombocytopenic purpura. The patient has been on observation for several years. Repeat CBC today showed platelets count of 139,000. I recommended for him to continue on observation with repeat CBC, comprehensive metabolic panel and LDH in 1 year. The patient was advised to call immediately if he has any other concerning symptoms in the interval. All questions were answered. The patient knows to call the clinic with any problems, questions or concerns. We can certainly see the patient much sooner if necessary.  Disclaimer: This note was dictated with voice recognition software. Similar sounding words can inadvertently be transcribed and may not be corrected upon review.

## 2020-08-14 ENCOUNTER — Encounter: Payer: Self-pay | Admitting: Legal Medicine

## 2020-08-19 ENCOUNTER — Encounter: Payer: Self-pay | Admitting: Legal Medicine

## 2020-08-19 ENCOUNTER — Other Ambulatory Visit: Payer: Self-pay

## 2020-08-19 ENCOUNTER — Ambulatory Visit (INDEPENDENT_AMBULATORY_CARE_PROVIDER_SITE_OTHER): Payer: Medicare HMO | Admitting: Legal Medicine

## 2020-08-19 VITALS — BP 122/62 | HR 63 | Resp 18 | Ht 70.0 in | Wt 157.6 lb

## 2020-08-19 DIAGNOSIS — D693 Immune thrombocytopenic purpura: Secondary | ICD-10-CM | POA: Diagnosis not present

## 2020-08-19 DIAGNOSIS — I25118 Atherosclerotic heart disease of native coronary artery with other forms of angina pectoris: Secondary | ICD-10-CM | POA: Diagnosis not present

## 2020-08-19 DIAGNOSIS — I5032 Chronic diastolic (congestive) heart failure: Secondary | ICD-10-CM | POA: Diagnosis not present

## 2020-08-19 DIAGNOSIS — E1151 Type 2 diabetes mellitus with diabetic peripheral angiopathy without gangrene: Secondary | ICD-10-CM

## 2020-08-19 DIAGNOSIS — I10 Essential (primary) hypertension: Secondary | ICD-10-CM | POA: Diagnosis not present

## 2020-08-19 DIAGNOSIS — N401 Enlarged prostate with lower urinary tract symptoms: Secondary | ICD-10-CM

## 2020-08-19 DIAGNOSIS — E782 Mixed hyperlipidemia: Secondary | ICD-10-CM

## 2020-08-19 DIAGNOSIS — I2510 Atherosclerotic heart disease of native coronary artery without angina pectoris: Secondary | ICD-10-CM

## 2020-08-19 DIAGNOSIS — E1142 Type 2 diabetes mellitus with diabetic polyneuropathy: Secondary | ICD-10-CM | POA: Diagnosis not present

## 2020-08-19 DIAGNOSIS — J449 Chronic obstructive pulmonary disease, unspecified: Secondary | ICD-10-CM

## 2020-08-19 NOTE — Progress Notes (Signed)
Subjective:  Patient ID: [name], male    DOB: 24-May-1941  Age: 80 y.o. MRN: [id number]  Chief Complaint  Patient presents with  . Hypertension  . Diabetes    HPI: chronic visit  Patient presents for follow up of hypertension.  Patient tolerating lisinopril, metoprolol well with side effects.  Patient was diagnosed with hypertension 2010 so has been treated for hypertension for 10 years.Patient is working on maintaining diet and exercise regimen and follows up as directed. Complication include none.  Patient present with type 2 diabetes.  Specifically, this is type 2, noninsulin requiring diabetes, complicated by polyneuropathy.  Compliance with treatment has been good; patient take medicines as directed, maintains diet and exercise regimen, follows up as directed, and is keeping glucose diary.  Date of  diagnosis 2010.  Depression screen has been performed.Tobacco screen nonsmoker. Current medicines for diabetes metformin.  Patient is on lisinopeil for renal protection and zetia for cholesterol control.  Patient performs foot exams daily and last ophthalmologic exam was yes.   Current Outpatient Medications on File Prior to Visit  Medication Sig Dispense Refill  . cholecalciferol (VITAMIN D3) 25 MCG (1000 UT) tablet Take 5,000 Units by mouth daily.    . clopidogrel (PLAVIX) 75 MG tablet Take 1 tablet (75 mg total) by mouth daily. 90 tablet 2  . ezetimibe (ZETIA) 10 MG tablet TAKE 1 TABLET BY MOUTH DAILY 90 tablet 2  . glucose blood (ONETOUCH ULTRA) test strip 1 each by Other route as needed for other. Use as instructed 100 strip 4  . isosorbide mononitrate (IMDUR) 30 MG 24 hr tablet Take 1 tablet (30 mg total) by mouth daily. 90 tablet 2  . Lancets MISC 1 each by Does not apply route daily as needed. Please provide lancets that fit patients lancet device 100 each 4  . lisinopril (ZESTRIL) 5 MG tablet Take 1 tablet (5 mg total) by mouth daily. 90 tablet 2  . metFORMIN (GLUCOPHAGE)  500 MG tablet Take 1 tablet (500 mg total) by mouth 3 (three) times daily. 270 tablet 2  . metoprolol tartrate (LOPRESSOR) 25 MG tablet Take 0.5 tablets (12.5 mg total) by mouth daily. 45 tablet 3  . Multiple Vitamin (MULTIVITAMIN) tablet Take 1 tablet by mouth daily.    . nitroGLYCERIN (NITROSTAT) 0.4 MG SL tablet Place 1 tablet (0.4 mg total) under the tongue every 5 (five) minutes as needed. 25 tablet 6   No current facility-administered medications on file prior to visit.   Past Medical History:  Diagnosis Date  . Atherosclerotic heart disease of native coronary artery without angina pectoris 02/12/2010   Qualifier: Diagnosis of  By: Marland Mcalpine    . Benign prostatic hyperplasia with lower urinary tract symptoms 12/04/2019  . CAD (coronary artery disease)    Hx of   . Cellulitis 03/19/2012  . Chronic obstructive pulmonary disease ([place]) 12/04/2019  . Diverticulosis of colon (without mention of hemorrhage) 2011   Colonoscopy   . Hiatal hernia 2011   EGD  . Hypertension   . Hypertensive heart disease without heart failure 12/04/2019  . ITP (idiopathic thrombocytopenic purpura)    He has been diagnosed with  . Pancytopenia   . Thrombocytopenia, unspecified ([place]) 01/24/2013  . TRANSAMINASES, SERUM, ELEVATED 02/12/2010   Qualifier: Diagnosis of  By: Marland Mcalpine    . Type 2 diabetes mellitus with diabetic peripheral angiopathy without gangrene ([place]) 12/04/2019   Past Surgical History:  Procedure Laterality Date  . CARDIAC CATHETERIZATION  His last heart catheterization in May of 2009 reveals a patent LIMA  . CORONARY ANGIOPLASTY     Successful percutaneous transluminal coronary angioplasty of the left circumflex obuse marginal vessel  . CORONARY ANGIOPLASTY WITH STENT PLACEMENT    . CORONARY ARTERY BYPASS GRAFT     x4 with a left internal mammary artery anastomosis to the left anterior descending coronary artery   . CORONARY STENT PLACEMENT     successful percutaneous  transluminal coronary angioplasty and stenting of the left main coronary artery  . HERNIA REPAIR    . SAPHENOUS VEIN GRAFT RESECTION     graft to the first obtuse marginal, a saphenous vein graft to the first diagonal coronary artery  and a saphenous vein graft to the distal right coronary artery  . US ECHOCARDIOGRAPHY  02-08-2007   Est. EF 40-45%    Family History  Problem Relation Age of Onset  . Cerebrovascular Accident Mother   . Colon cancer Neg Hx    Social History   Socioeconomic History  . Marital status: Married    Spouse name: Not on file  . Number of children: 3  . Years of education: Not on file  . Highest education level: Not on file  Occupational History  . Occupation: Retired   Tobacco Use  . Smoking status: Never Smoker  . Smokeless tobacco: Never Used  Vaping Use  . Vaping Use: Never used  Substance and Sexual Activity  . Alcohol use: No  . Drug use: No  . Sexual activity: Not Currently  Other Topics Concern  . Not on file  Social History Narrative   Daily caffeine use    Social Determinants of Health   Financial Resource Strain: Not on file  Food Insecurity: Not on file  Transportation Needs: Not on file  Physical Activity: Not on file  Stress: Not on file  Social Connections: Not on file    Review of Systems  Constitutional: Negative.  Negative for activity change, appetite change and unexpected weight change.  HENT: Negative for congestion and sinus pain.   Eyes: Negative for visual disturbance.  Respiratory: Negative for apnea, chest tightness and shortness of breath.   Cardiovascular: Negative for chest pain, palpitations and leg swelling.  Gastrointestinal: Negative for abdominal distention and abdominal pain.  Endocrine: Negative for polyuria.  Genitourinary: Negative for difficulty urinating, dysuria and urgency.  Musculoskeletal: Positive for arthralgias.  Skin: Negative.   Neurological: Negative.   Psychiatric/Behavioral: Negative.       Objective:  BP 122/62   Pulse 63   Resp 18   Ht 5\' 10"  (1.778 m)   Wt 157 lb 9.6 oz (71.5 kg)   SpO2 97%   BMI 22.61 kg/m   BP/Weight 08/19/2020 07/22/2020 [id number]  Systolic BP 123XX123 [id number] [id number]  Diastolic BP 62 68 70  Wt. (Lbs) 157.6 158.6 156.2  BMI 22.61 23.42 23.07    Physical Exam Vitals reviewed.  Constitutional:      General: He is not in acute distress.    Appearance: Normal appearance.  HENT:     Head: Normocephalic and atraumatic.     Right Ear: Tympanic membrane, ear canal and external ear normal.     Left Ear: Tympanic membrane, ear canal and external ear normal.     Mouth/Throat:     Mouth: Mucous membranes are moist.     Pharynx: Oropharynx is clear.  Eyes:     Extraocular Movements: Extraocular movements intact.     Conjunctiva/sclera: Conjunctivae normal.  Pupils: Pupils are equal, round, and reactive to light.  Cardiovascular:     Rate and Rhythm: Normal rate and regular rhythm.     Pulses: Normal pulses.     Heart sounds: No murmur heard. No gallop.   Pulmonary:     Effort: Pulmonary effort is normal. No respiratory distress.     Breath sounds: No rales.  Abdominal:     General: Abdomen is flat. Bowel sounds are normal. There is no distension.     Palpations: Abdomen is soft.     Tenderness: There is no abdominal tenderness.  Musculoskeletal:        General: Normal range of motion.     Cervical back: Normal range of motion and neck supple.  Skin:    General: Skin is warm and dry.     Capillary Refill: Capillary refill takes less than 2 seconds.  Neurological:     General: No focal deficit present.     Mental Status: He is alert and oriented to person, place, and time.     Sensory: Sensory deficit present.  Psychiatric:        Mood and Affect: Mood normal.        Thought Content: Thought content normal.        Judgment: Judgment normal.     Diabetic Foot Exam - Simple   Simple Foot Form Diabetic Foot exam was performed with the  following findings: Yes 08/19/2020  8:38 AM  Visual Inspection See comments: Yes Sensation Testing See comments: Yes Pulse Check Posterior Tibialis and Dorsalis pulse intact bilaterally: Yes Comments Hammer toes and bunion, poor sensation to fiber      Lab Results  Component Value Date   WBC 5.8 07/22/2020   HGB 13.9 07/22/2020   HCT 39.3 07/22/2020   PLT 139 (L) 07/22/2020   GLUCOSE 153 (H) 07/22/2020   CHOL 177 04/15/2020   TRIG 67 04/15/2020   HDL 63 04/15/2020   LDLCALC 101 (H) 04/15/2020   ALT 17 07/22/2020   AST 22 07/22/2020   NA 137 07/22/2020   K 4.1 07/22/2020   CL 102 07/22/2020   CREATININE 0.95 07/22/2020   BUN 12 07/22/2020   CO2 28 07/22/2020   TSH 2.450 04/15/2020   INR 1.3 (H) 03/24/2011   HGBA1C 6.3 (H) 04/15/2020      Assessment & Plan:   1. Chronic obstructive pulmonary disease, unspecified COPD type ([place]) An individualize plan was formulated for care of COPD.  Treatment is evidence based.  She will continue on inhalers, avoid smoking and smoke.  Regular exercise with help with dyspnea. Routine follow ups and medication compliance is needed.  2. Chronic diastolic congestive heart failure [facility]) An individualized care plan was established and reinforced.  The patient's disease status was assessed using clinical finding son exam today, labs, and/or other diagnostic testing such as x-rays, to determine the patient's success in meeting treatmentgoalsbased on disease-based guidelines and found to be stable. But not at goal yet. HFpEF Medications prescriptions no changes Laboratory tests ordered to be performed today include routine. RECOMMENDATIONS: given include see cardiology.  Call physician is patient gains 3 lbs in one day or 5 lbs for one week.  Call for progressive PND, orthopnea or increased pedal edema.  3. Coronary artery disease of native artery of native heart with stable angina pectoris ([place]) Patient's CAD was assessed using history and  physical along with other information to maximize treatment.  Evidence based criteria was use in deciding proper  management for this disease process.  Patient's CAD is under good control.therapy continue present treatment.  5. Benign essential HTN An individual hypertension care plan was established and reinforced today.  The patient's status was assessed using clinical findings on exam and labs or diagnostic tests. The patient's success at meeting treatment goals on disease specific evidence-based guidelines and found to be well controlled. SELF MANAGEMENT: The patient and I together assessed ways to personally work towards obtaining the recommended goals. RECOMMENDATIONS: avoid decongestants found in common cold remedies, decrease consumption of alcohol, perform routine monitoring of BP with home BP cuff, exercise, reduction of dietary salt, take medicines as prescribed, try not to miss doses and quit smoking.  Regular exercise and maintaining a healthy weight is needed.  Stress reduction may help. A CLINICAL SUMMARY including written plan identify barriers to care unique to individual due to social or financial issues.  We attempt to mutually creat solutions for individual and family understanding.  6. Diabetic polyneuropathy associated with type 2 diabetes mellitus ([place]) An individual care plan for diabetes was established and reinforced today.  The patient's status was assessed using clinical findings on exam, labs and diagnostic testing. Patient success at meeting goals based on disease specific evidence-based guidelines and found to be good controlled. Medications were assessed and patient's understanding of the medical issues , including barriers were assessed. Recommend adherence to a diabetic diet, a graduated exercise program, HgbA1c level is checked quarterly, and urine microalbumin performed yearly .  Annual mono-filament sensation testing performed. Lower blood pressure and control  hyperlipidemia is important. Get annual eye exams and annual flu shots and smoking cessation discussed.  Self management goals were discussed.  7. Benign prostatic hyperplasia with lower urinary tract symptoms, symptom details unspecified AN INDIVIDUAL CARE PLAN was established and reinforced today.  The patient's status was assessed using clinical findings on exam, labs, and other diagnostic testing. Patient's success at meeting treatment goals based on disease specific evidence-bassed guidelines and found to be in good control. RECOMMENDATIONS include maintaining present medicines and treatment.  8. Idiopathic thrombocytopenic purpura ([place]) Patient has senile purpura that is stable  9. Mixed hyperlipidemia AN INDIVIDUAL CARE PLAN for hyperlipidemia/ cholesterol was established and reinforced today.  The patient's status was assessed using clinical findings on exam, lab and other diagnostic tests. The patient's disease status was assessed based on evidence-based guidelines and found to be well controlled. MEDICATIONS were reviewed. SELF MANAGEMENT GOALS have been discussed and patient's success at attaining the goal of low cholesterol was assessed. RECOMMENDATION given include regular exercise 3 days a week and low cholesterol/low fat diet. CLINICAL SUMMARY including written plan to identify barriers unique to the patient due to social or economic  reasons was discussed.          I spent 30 minutes dedicated to the care of this patient on the date of this encounter to include face-to-face time with the patient, as well as: review old records,   Follow-up: Return in about 4 months (around 12/17/2020) for fasting.  An After Visit Summary was printed and given to the patient.  Reinaldo Meeker, MD [facility] [phone]

## 2020-08-20 LAB — CBC WITH DIFFERENTIAL/PLATELET
Basophils Absolute: 0 10*3/uL (ref 0.0–0.2)
Basos: 0 %
EOS (ABSOLUTE): 0.1 10*3/uL (ref 0.0–0.4)
Eos: 2 %
Hematocrit: 43.3 % (ref 37.5–51.0)
Hemoglobin: 14.9 g/dL (ref 13.0–17.7)
Immature Grans (Abs): 0 10*3/uL (ref 0.0–0.1)
Immature Granulocytes: 1 %
Lymphocytes Absolute: 1.2 10*3/uL (ref 0.7–3.1)
Lymphs: 20 %
MCH: 32.1 pg (ref 26.6–33.0)
MCHC: 34.4 g/dL (ref 31.5–35.7)
MCV: 93 fL (ref 79–97)
Monocytes Absolute: 0.5 10*3/uL (ref 0.1–0.9)
Monocytes: 8 %
Neutrophils Absolute: 4.2 10*3/uL (ref 1.4–7.0)
Neutrophils: 69 %
Platelets: 141 10*3/uL — ABNORMAL LOW (ref 150–450)
RBC: 4.64 x10E6/uL (ref 4.14–5.80)
RDW: 12.1 % (ref 11.6–15.4)
WBC: 6 10*3/uL (ref 3.4–10.8)

## 2020-08-20 LAB — LIPID PANEL
Chol/HDL Ratio: 2.9 ratio (ref 0.0–5.0)
Cholesterol, Total: 200 mg/dL — ABNORMAL HIGH (ref 100–199)
HDL: 68 mg/dL
LDL Chol Calc (NIH): 113 mg/dL — ABNORMAL HIGH (ref 0–99)
Triglycerides: 108 mg/dL (ref 0–149)
VLDL Cholesterol Cal: 19 mg/dL (ref 5–40)

## 2020-08-20 LAB — COMPREHENSIVE METABOLIC PANEL
ALT: 14 IU/L (ref 0–44)
AST: 20 IU/L (ref 0–40)
Albumin/Globulin Ratio: 1.4 (ref 1.2–2.2)
Albumin: 4.2 g/dL (ref 3.6–4.6)
Alkaline Phosphatase: 46 IU/L (ref 44–121)
BUN/Creatinine Ratio: 13 (ref 10–24)
BUN: 11 mg/dL (ref 8–27)
Bilirubin Total: 1.3 mg/dL — ABNORMAL HIGH (ref 0.0–1.2)
CO2: 23 mmol/L (ref 20–29)
Calcium: 9.8 mg/dL (ref 8.6–10.2)
Chloride: 100 mmol/L (ref 96–106)
Creatinine, Ser: 0.86 mg/dL (ref 0.76–1.27)
GFR calc Af Amer: 94 mL/min/{1.73_m2} (ref >59)
GFR calc non Af Amer: 81 mL/min/{1.73_m2} (ref >59)
Globulin, Total: 2.9 g/dL (ref 1.5–4.5)
Glucose: 141 mg/dL — ABNORMAL HIGH (ref 65–99)
Potassium: 4.6 mmol/L (ref 3.5–5.2)
Sodium: 138 mmol/L (ref 134–144)
Total Protein: 7.1 g/dL (ref 6.0–8.5)

## 2020-08-20 LAB — HEMOGLOBIN A1C
Est. average glucose Bld gHb Est-mCnc: 137 mg/dL
Hgb A1c MFr Bld: 6.4 % — ABNORMAL HIGH (ref 4.8–5.6)

## 2020-08-20 LAB — CARDIOVASCULAR RISK ASSESSMENT

## 2020-08-20 LAB — PSA: Prostate Specific Ag, Serum: 3 ng/mL (ref 0.0–4.0)

## 2020-08-20 NOTE — Progress Notes (Signed)
Cbc normal, platelets chronically low but mild, glucose 141, kidneys normal, liver tests normal, A1c 6.4 good, lipids LDL high at 113 sty with diet, PSA 3 normal lp

## 2020-10-15 ENCOUNTER — Other Ambulatory Visit: Payer: Self-pay | Admitting: Cardiovascular Disease

## 2020-11-01 ENCOUNTER — Telehealth: Payer: Self-pay

## 2020-11-01 NOTE — Telephone Encounter (Signed)
I left a message stating that the appointment for 12/17/2020 has been canceled due to the provider being out of the office. I asked for the patient to call back to get the appointment RS. 

## 2020-11-05 NOTE — Telephone Encounter (Signed)
Appointment has been rescheduled.

## 2020-11-07 ENCOUNTER — Other Ambulatory Visit: Payer: Self-pay | Admitting: Legal Medicine

## 2020-11-07 ENCOUNTER — Other Ambulatory Visit: Payer: Self-pay | Admitting: Cardiovascular Disease

## 2020-11-07 DIAGNOSIS — E1165 Type 2 diabetes mellitus with hyperglycemia: Secondary | ICD-10-CM

## 2020-11-07 DIAGNOSIS — IMO0002 Reserved for concepts with insufficient information to code with codable children: Secondary | ICD-10-CM

## 2020-11-13 ENCOUNTER — Encounter: Payer: Self-pay | Admitting: Cardiovascular Disease

## 2020-11-13 NOTE — Progress Notes (Signed)
Cardiology Office Note  Date:  11/14/2020   ID:  [name], DOB 24-Jan-1941, MRN [id number]  PCP:  Lillard Anes, MD  Cardiologist:   Mertie Moores, MD   Chief Complaint  Patient presents with  . Coronary Artery Disease  . Hyperlipidemia   1. Coronary artery disease-status post MI and eventually s/p CABG  ( 2003) and also stenting ( 2007) 2. Hyperlipidemia-he has been generally intolerant of statins-Crestor causes muscle pain, Lipitor causes memory loss, Zocor causes muscle pain. 2. Diabetes mellitus 3.  Chronic diastolic CHF:   Mr. Kawano is an 80 year old gentleman with a history of coronary artery disease. He status post coronary artery bypass grafting. He is also status post PTCA and stenting. He also has a history of hyperlipidemia and diabetes mellitus.   He is exercising regularly . He does lots of yard work. He enjoys going to [place] house.  January 05, 2013:  Mr. Duve is doing well. His garden is doing well. Occasional CP and dyspnea. Pains last only for a few seconds. Does not have to take a NTG. Mows the lawn and does yard work without problems.   Dec. 16, 2014:  Mr. Shouse is doing well. Doing yard work without problems. Occasional episodes  December 12, 2013: rafal is doing ok. Rare CP . Frequent head aches  Dec. 1 , 2015:  No cardiac complaints.  Aches all over. "staggers " when he walks or stands. No angina. He notices That he has occasional episodes of dyspnea.    December 13, 2014:  HEWITT GARNER is a 80 y.o. male who presents for his  CAD. Very rare CP.  Has some DOE climbing up his hill at his house.  Nov. 29, 2016:  Mr. Strassman continues to have some chest pain .    Seems to start in his lower abdomen and moves up. Will last for several days.  NTG works at times and at other times, has no effect.  Exercising some.   Fatigues easily .  Has leg pain with walking .   Thinks the diabetes has a lot to do with  his fatigue . Glucose levels are ok  Goes [place] in [place]   Nov 12, 2015:  Doing well from a cardiac standpoint. Got dizzy - does not know if he had a syncopal episode. Had not been out working much. He thinks he may have tripped on the step   Oct. 30 , 2017:  Doing well. No CP or dyspnea  Has had some sinus issues,  Went to [facility]. ,   Hx of CAD - intolerant to statins  - lots of muscle aches   January 28, 2017:  Mr. Heckendorn is seen today . Seems to be stable.   Still has a little CP with walking up hills but this seems to be stable .  Does not tolerate statins Is on Niacin,   Last lipids level looks good.   Feb. 8, 2019  [name] is seen back today  No CP , Has some knots on his legs.     Oct. 22, 2019: Seen wife wife today --  No CP or dyspnea. Has episodes of hypoglycemia and has subsequent dizziness.  Still moderately active.    Has some DOE working outside.  Legs and feet hurt him frequently  - has arthritis   May 10, 2019:  Branndon is seen today for follow-up visit of his coronary artery disease and  congestive heart failure.  I saw him via telemedicine visit in April.  No cp or dyspnea.  Able to do all of his normal activities.    Nov 14, 2020: Renso is seen for follow of up his CAD, COPD  Is falling some,  Encouraged him to use a cane or walker  Is able to get some exercise ,      Past Medical History:  Diagnosis Date  . Atherosclerotic heart disease of native coronary artery without angina pectoris 02/12/2010   Qualifier: Diagnosis of  By: Marland Mcalpine    . Benign prostatic hyperplasia with lower urinary tract symptoms 12/04/2019  . CAD (coronary artery disease)    Hx of   . Cellulitis 03/19/2012  . Chronic obstructive pulmonary disease ([place]) 12/04/2019  . Diverticulosis of colon (without mention of hemorrhage) 2011   Colonoscopy   . Hiatal hernia 2011   EGD  . Hypertension   . Hypertensive heart disease without heart  failure 12/04/2019  . ITP (idiopathic thrombocytopenic purpura)    He has been diagnosed with  . Pancytopenia   . Thrombocytopenia, unspecified ([place]) 01/24/2013  . TRANSAMINASES, SERUM, ELEVATED 02/12/2010   Qualifier: Diagnosis of  By: Marland Mcalpine    . Type 2 diabetes mellitus with diabetic peripheral angiopathy without gangrene ([place]) 12/04/2019    Past Surgical History:  Procedure Laterality Date  . CARDIAC CATHETERIZATION     His last heart catheterization in May of 2009 reveals a patent LIMA  . CORONARY ANGIOPLASTY     Successful percutaneous transluminal coronary angioplasty of the left circumflex obuse marginal vessel  . CORONARY ANGIOPLASTY WITH STENT PLACEMENT    . CORONARY ARTERY BYPASS GRAFT     x4 with a left internal mammary artery anastomosis to the left anterior descending coronary artery   . CORONARY STENT PLACEMENT     successful percutaneous transluminal coronary angioplasty and stenting of the left main coronary artery  . HERNIA REPAIR    . SAPHENOUS VEIN GRAFT RESECTION     graft to the first obtuse marginal, a saphenous vein graft to the first diagonal coronary artery  and a saphenous vein graft to the distal right coronary artery  . US ECHOCARDIOGRAPHY  02-08-2007   Est. EF 40-45%     Current Outpatient Medications  Medication Sig Dispense Refill  . cholecalciferol (VITAMIN D3) 25 MCG (1000 UT) tablet Take 5,000 Units by mouth daily.    . clopidogrel (PLAVIX) 75 MG tablet TAKE 1 TABLET BY MOUTH DAILY 90 tablet 2  . ezetimibe (ZETIA) 10 MG tablet Take 1 tablet (10 mg total) by mouth daily. Please make yearly appt with Dr. Acie Fredrickson for May 2022 for future refills. Thank you 1st attempt 30 tablet 0  . isosorbide mononitrate (IMDUR) 30 MG 24 hr tablet TAKE 1 TABLET BY MOUTH DAILY 90 tablet 2  . Lancets MISC 1 each by Does not apply route daily as needed. Please provide lancets that fit patients lancet device 100 each 4  . lisinopril (ZESTRIL) 5 MG tablet Take 1  tablet (5 mg total) by mouth daily. 90 tablet 2  . metFORMIN (GLUCOPHAGE) 500 MG tablet Take 1 tablet (500 mg total) by mouth 3 (three) times daily. 270 tablet 2  . metoprolol tartrate (LOPRESSOR) 25 MG tablet Take 0.5 tablets (12.5 mg total) by mouth daily. 45 tablet 3  . Multiple Vitamin (MULTIVITAMIN) tablet Take 1 tablet by mouth daily.    . nitroGLYCERIN (NITROSTAT) 0.4 MG SL tablet Place 1  tablet (0.4 mg total) under the tongue every 5 (five) minutes as needed. 25 tablet 6  . ONETOUCH ULTRA test strip AS DIRECTED 100 each 4   No current facility-administered medications for this visit.    Allergies:   Lipitor [atorvastatin calcium], Crestor [rosuvastatin calcium], and Zocor [simvastatin]    Social History:  The patient  reports that he has never smoked. He has never used smokeless tobacco. He reports that he does not drink alcohol and does not use drugs.   Family History:  The patient's family history includes Cerebrovascular Accident in his mother.   ROS: Noted in current history, all other systems are negative.Marland Kitchen    Physical Exam: Blood pressure 132/68, pulse 67, height 5\' 10"  (1.778 m), weight 154 lb 6.4 oz (70 kg), SpO2 99 %.  GEN:  Well nourished, well developed in no acute distress HEENT: Normal NECK: No JVD; No carotid bruits LYMPHATICS: No lymphadenopathy CARDIAC: RRR , no murmurs, rubs, gallops RESPIRATORY:  Clear to auscultation without rales, wheezing or rhonchi  ABDOMEN: Soft, non-tender, non-distended MUSCULOSKELETAL:  No edema; No deformity  SKIN: Warm and dry NEUROLOGIC:  Alert and oriented x 3   EKG:      Nov 14, 2020:  NSR at 67,  Occasional PVCs  .  Recent Labs: 04/15/2020: TSH 2.450 08/19/2020: ALT 14; BUN 11; Creatinine, Ser 0.86; Hemoglobin 14.9; Platelets 141; Potassium 4.6; Sodium 138   Lipid Panel    Component Value Date/Time   CHOL 200 (H) 08/19/2020 0845   TRIG 108 08/19/2020 0845   HDL 68 08/19/2020 0845   CHOLHDL 2.9 08/19/2020 0845    CHOLHDL 2.7 05/11/2016 1002   VLDL 18 05/11/2016 1002   LDLCALC 113 (H) 08/19/2020 0845      Wt Readings from Last 3 Encounters:  11/14/20 154 lb 6.4 oz (70 kg)  08/19/20 157 lb 9.6 oz (71.5 kg)  07/22/20 158 lb 9.6 oz (71.9 kg)      Other studies Reviewed: Additional studies/ records that were reviewed today include: . Review of the above records demonstrates:    ASSESSMENT AND PLAN:  1. Coronary artery disease-    No angina .   Has tried multiple statins  Is on zetia  No angina   2. Hyperlipidemia-   Intolerant to lipator, crestor, simva Cont zetia  Managed by Dr. Henrene Pastor.    .  3. Diabetes mellitus -     4. Weakness:   TSH was normal in [facility].         The following changes have been made:  no change  Labs/ tests ordered today include:   Orders Placed This Encounter  Procedures  . EKG 12-Lead  . ECHOCARDIOGRAM COMPLETE     Disposition:      Mertie Moores, MD  11/14/2020 5:20 PM    [facility] [address], [place], [place]  [address] Phone: [phone]; Fax: [phone]

## 2020-11-14 ENCOUNTER — Encounter: Payer: Self-pay | Admitting: Cardiovascular Disease

## 2020-11-14 ENCOUNTER — Ambulatory Visit: Payer: Medicare HMO | Admitting: Cardiovascular Disease

## 2020-11-14 ENCOUNTER — Other Ambulatory Visit: Payer: Self-pay

## 2020-11-14 VITALS — BP 132/68 | HR 67 | Ht 70.0 in | Wt 154.4 lb

## 2020-11-14 DIAGNOSIS — I25118 Atherosclerotic heart disease of native coronary artery with other forms of angina pectoris: Secondary | ICD-10-CM

## 2020-11-14 DIAGNOSIS — E782 Mixed hyperlipidemia: Secondary | ICD-10-CM

## 2020-11-14 DIAGNOSIS — I2510 Atherosclerotic heart disease of native coronary artery without angina pectoris: Secondary | ICD-10-CM | POA: Diagnosis not present

## 2020-11-14 DIAGNOSIS — R531 Weakness: Secondary | ICD-10-CM | POA: Diagnosis not present

## 2020-11-14 NOTE — Patient Instructions (Signed)
Medication Instructions:  Your physician recommends that you continue on your current medications as directed. Please refer to the Current Medication list given to you today.  *If you need a refill on your cardiac medications before your next appointment, please call your pharmacy*   Lab Work: none If you have labs (blood work) drawn today and your tests are completely normal, you will receive your results only by: Marland Kitchen MyChart Message (if you have MyChart) OR . A paper copy in the mail If you have any lab test that is abnormal or we need to change your treatment, we will call you to review the results.   Testing/Procedures: Your physician has requested that you have an echocardiogram. Echocardiography is a painless test that uses sound waves to create images of your heart. It provides your doctor with information about the size and shape of your heart and how well your heart's chambers and valves are working. This procedure takes approximately one hour. There are no restrictions for this procedure.     Follow-Up: At [facility], you and your health needs are our priority.  As part of our continuing mission to provide you with exceptional heart care, we have created designated Provider Care Teams.  These Care Teams include your primary Cardiologist (physician) and Advanced Practice Providers (APPs -  Physician Assistants and Nurse Practitioners) who all work together to provide you with the care you need, when you need it.   Your next appointment:   1 year(s)  The format for your next appointment:   In Person  Provider:   Mertie Moores, MD

## 2020-12-16 ENCOUNTER — Ambulatory Visit (HOSPITAL_COMMUNITY): Payer: Medicare HMO | Attending: Cardiology

## 2020-12-16 ENCOUNTER — Telehealth: Payer: Self-pay | Admitting: Cardiovascular Disease

## 2020-12-16 ENCOUNTER — Other Ambulatory Visit: Payer: Self-pay

## 2020-12-16 DIAGNOSIS — I110 Hypertensive heart disease with heart failure: Secondary | ICD-10-CM | POA: Insufficient documentation

## 2020-12-16 DIAGNOSIS — J449 Chronic obstructive pulmonary disease, unspecified: Secondary | ICD-10-CM | POA: Diagnosis not present

## 2020-12-16 DIAGNOSIS — I509 Heart failure, unspecified: Secondary | ICD-10-CM | POA: Insufficient documentation

## 2020-12-16 DIAGNOSIS — E119 Type 2 diabetes mellitus without complications: Secondary | ICD-10-CM | POA: Insufficient documentation

## 2020-12-16 DIAGNOSIS — I2510 Atherosclerotic heart disease of native coronary artery without angina pectoris: Secondary | ICD-10-CM | POA: Diagnosis not present

## 2020-12-16 DIAGNOSIS — R531 Weakness: Secondary | ICD-10-CM

## 2020-12-16 DIAGNOSIS — I080 Rheumatic disorders of both mitral and aortic valves: Secondary | ICD-10-CM | POA: Insufficient documentation

## 2020-12-16 DIAGNOSIS — E785 Hyperlipidemia, unspecified: Secondary | ICD-10-CM | POA: Diagnosis not present

## 2020-12-16 LAB — ECHOCARDIOGRAM COMPLETE
Area-P 1/2: 2.18 cm2
MV M vel: 4.74 m/s
MV Peak grad: 89.9 mmHg
S' Lateral: 5.2 cm

## 2020-12-16 MED ORDER — EZETIMIBE 10 MG PO TABS: 10.0000 mg | ORAL_TABLET | Freq: Every day | ORAL | 3 refills | Status: DC

## 2020-12-16 NOTE — Telephone Encounter (Signed)
New message      *STAT* If patient is at the pharmacy, call can be transferred to refill team.   1. Which medications need to be refilled? (please list name of each medication and dose if known)  Zetia 10mg   2. Which pharmacy/location (including street and city if local pharmacy) is medication to be sent to? [facility]  3. Do they need a 30 day or 90 day supply? 90 day supply

## 2020-12-16 NOTE — Telephone Encounter (Signed)
Pt's medication was sent to pt's pharmacy as requested. Confirmation received.  °

## 2020-12-17 ENCOUNTER — Ambulatory Visit: Payer: Medicare HMO | Admitting: Legal Medicine

## 2021-01-06 ENCOUNTER — Other Ambulatory Visit: Payer: Self-pay | Admitting: Legal Medicine

## 2021-01-07 ENCOUNTER — Other Ambulatory Visit: Payer: Self-pay

## 2021-01-07 ENCOUNTER — Encounter: Payer: Self-pay | Admitting: Legal Medicine

## 2021-01-07 ENCOUNTER — Ambulatory Visit (INDEPENDENT_AMBULATORY_CARE_PROVIDER_SITE_OTHER): Payer: Medicare HMO | Admitting: Legal Medicine

## 2021-01-07 VITALS — BP 110/60 | HR 67 | Temp 97.5°F | Resp 16 | Ht 70.0 in | Wt 149.0 lb

## 2021-01-07 DIAGNOSIS — J449 Chronic obstructive pulmonary disease, unspecified: Secondary | ICD-10-CM

## 2021-01-07 DIAGNOSIS — I2510 Atherosclerotic heart disease of native coronary artery without angina pectoris: Secondary | ICD-10-CM

## 2021-01-07 DIAGNOSIS — I5032 Chronic diastolic (congestive) heart failure: Secondary | ICD-10-CM

## 2021-01-07 DIAGNOSIS — E782 Mixed hyperlipidemia: Secondary | ICD-10-CM | POA: Diagnosis not present

## 2021-01-07 DIAGNOSIS — E1142 Type 2 diabetes mellitus with diabetic polyneuropathy: Secondary | ICD-10-CM

## 2021-01-07 DIAGNOSIS — E1151 Type 2 diabetes mellitus with diabetic peripheral angiopathy without gangrene: Secondary | ICD-10-CM

## 2021-01-07 DIAGNOSIS — D693 Immune thrombocytopenic purpura: Secondary | ICD-10-CM

## 2021-01-07 DIAGNOSIS — Z789 Other specified health status: Secondary | ICD-10-CM | POA: Diagnosis not present

## 2021-01-07 DIAGNOSIS — Z6821 Body mass index (BMI) 21.0-21.9, adult: Secondary | ICD-10-CM | POA: Diagnosis not present

## 2021-01-07 DIAGNOSIS — N401 Enlarged prostate with lower urinary tract symptoms: Secondary | ICD-10-CM

## 2021-01-07 NOTE — Progress Notes (Signed)
Established Patient Office Visit  Subjective:  Patient ID: [name], male    DOB: [birth year]  Age: 80 y.o. MRN: [id number]  CC:  Chief Complaint  Patient presents with   Diabetes   Hypertension   Hyperlipidemia    HPI [name] presents for chronic visit  Patient present with type 2 diabetes.  Specifically, this is type 2, nonnsulin requiring diabetes, complicated by vascular disease.polyneuropathy  Compliance with treatment has been good; patient take medicines as directed, maintains diet and exercise regimen, follows up as directed, and is keeping glucose diary.  Date of  diagnosis metformin.  Depression screen has been performed.Tobacco screen nonsmoker. Current medicines for diabetes metformin.  Patient is on lisinopril for renal protection and zetia for cholesterol control.  Patient performs foot exams daily and last ophthalmologic exam was yes  Patient presents for follow up of hypertension.  Patient tolerating metoprolol well with side effects.  Patient was diagnosed with hypertension 2010 so has been treated for hypertension for 10 years.Patient is working on maintaining diet and exercise regimen and follows up as directed. Complication include cad  Patient presents with hyperlipidemia.  Compliance with treatment has been good; patient takes medicines as directed, maintains low cholesterol diet, follows up as directed, and maintains exercise regimen.  Patient is using zetia without problems. . .   CORONARY ARTERY DISEASE  Patient presents in follow up of CAD. Patient was diagnosed in 2003. The patient has no associated CHF. The patient is currently taking a beta blocker, statin, and aspirin. CAD was diagnosed 19 years ago.  Patient is having no angina. Patient has used no NTG.  Patient is followed by cardiology.  Patient had cabg 2003 . Last angiography was 2007, last echocardiogram 6/22.   Past Medical History:  Diagnosis Date   Atherosclerotic heart disease of  native coronary artery without angina pectoris 02/12/2010   Qualifier: Diagnosis of  By: Marland Mcalpine     Benign prostatic hyperplasia with lower urinary tract symptoms 12/04/2019   CAD (coronary artery disease)    Hx of    Cellulitis 03/19/2012   Chronic obstructive pulmonary disease ([place]) 12/04/2019   Diverticulosis of colon (without mention of hemorrhage) 2011   Colonoscopy    Hiatal hernia 2011   EGD   Hypertension    Hypertensive heart disease without heart failure 12/04/2019   ITP (idiopathic thrombocytopenic purpura)    He has been diagnosed with   Pancytopenia    Thrombocytopenia, unspecified ([place]) 01/24/2013   TRANSAMINASES, SERUM, ELEVATED 02/12/2010   Qualifier: Diagnosis of  By: Marland Mcalpine     Type 2 diabetes mellitus with diabetic peripheral angiopathy without gangrene ([place]) 12/04/2019    Past Surgical History:  Procedure Laterality Date   CARDIAC CATHETERIZATION     His last heart catheterization in May of 2009 reveals a patent LIMA   CORONARY ANGIOPLASTY     Successful percutaneous transluminal coronary angioplasty of the left circumflex obuse marginal vessel   CORONARY ANGIOPLASTY WITH STENT PLACEMENT     CORONARY ARTERY BYPASS GRAFT     x4 with a left internal mammary artery anastomosis to the left anterior descending coronary artery    CORONARY STENT PLACEMENT     successful percutaneous transluminal coronary angioplasty and stenting of the left main coronary artery   HERNIA REPAIR     SAPHENOUS VEIN GRAFT RESECTION     graft to the first obtuse marginal, a saphenous vein graft to the first diagonal coronary artery  and a saphenous vein graft to the distal right coronary artery   US ECHOCARDIOGRAPHY  02-08-2007   Est. EF 40-45%    Family History  Problem Relation Age of Onset   Cerebrovascular Accident Mother    Colon cancer Neg Hx     Social History   Socioeconomic History   Marital status: Married    Spouse name: Not on file   Number of  children: 3   Years of education: Not on file   Highest education level: Not on file  Occupational History   Occupation: Retired   Tobacco Use   Smoking status: Never   Smokeless tobacco: Never  Vaping Use   Vaping Use: Never used  Substance and Sexual Activity   Alcohol use: No   Drug use: No   Sexual activity: Not Currently  Other Topics Concern   Not on file  Social History Narrative   Daily caffeine use    Social Determinants of Health   Financial Resource Strain: Not on file  Food Insecurity: Not on file  Transportation Needs: Not on file  Physical Activity: Not on file  Stress: Not on file  Social Connections: Not on file  Intimate Partner Violence: Not on file    Outpatient Medications Prior to Visit  Medication Sig Dispense Refill   cholecalciferol (VITAMIN D3) 25 MCG (1000 UT) tablet Take 5,000 Units by mouth daily.     clopidogrel (PLAVIX) 75 MG tablet TAKE 1 TABLET BY MOUTH DAILY 90 tablet 2   ezetimibe (ZETIA) 10 MG tablet Take 1 tablet (10 mg total) by mouth daily. 90 tablet 3   isosorbide mononitrate (IMDUR) 30 MG 24 hr tablet TAKE 1 TABLET BY MOUTH DAILY 90 tablet 2   Lancets MISC 1 each by Does not apply route daily as needed. Please provide lancets that fit patients lancet device 100 each 4   lisinopril (ZESTRIL) 5 MG tablet TAKE 1 TABLET BY MOUTH DAILY 90 tablet 2   metFORMIN (GLUCOPHAGE) 500 MG tablet TAKE 1 TABLET BY MOUTH 3 TIMES DAILY 270 tablet 2   metoprolol tartrate (LOPRESSOR) 25 MG tablet Take 0.5 tablets (12.5 mg total) by mouth daily. 45 tablet 3   Multiple Vitamin (MULTIVITAMIN) tablet Take 1 tablet by mouth daily.     nitroGLYCERIN (NITROSTAT) 0.4 MG SL tablet Place 1 tablet (0.4 mg total) under the tongue every 5 (five) minutes as needed. 25 tablet 6   ONETOUCH ULTRA test strip AS DIRECTED 100 each 4   No facility-administered medications prior to visit.    Allergies  Allergen Reactions   Lipitor [Atorvastatin Calcium] Other (See  Comments)    Causes memory loss   Crestor [Rosuvastatin Calcium] Other (See Comments)    Causes Muscle Pain   Zocor [Simvastatin] Other (See Comments)    Causes muscle pain    ROS Review of Systems  Constitutional:  Negative for chills, fatigue and fever.  HENT:  Negative for congestion, ear pain and sore throat.   Respiratory:  Negative for cough and shortness of breath.   Cardiovascular:  Negative for chest pain.  Gastrointestinal:  Negative for abdominal pain, constipation, diarrhea, nausea and vomiting.  Endocrine: Negative for polydipsia, polyphagia and polyuria.  Genitourinary:  Negative for dysuria and frequency.  Musculoskeletal:  Negative for arthralgias and myalgias.  Neurological:  Negative for dizziness and headaches.  Psychiatric/Behavioral:  Negative for dysphoric mood.        No dysphoria     Objective:    Physical Exam Vitals  reviewed.  Constitutional:      Appearance: Normal appearance.  HENT:     Head: Normocephalic.     Right Ear: Tympanic membrane, ear canal and external ear normal.     Left Ear: Tympanic membrane, ear canal and external ear normal.     Nose: Nose normal.     Mouth/Throat:     Mouth: Mucous membranes are moist.     Pharynx: Oropharynx is clear.  Eyes:     Extraocular Movements: Extraocular movements intact.     Conjunctiva/sclera: Conjunctivae normal.     Pupils: Pupils are equal, round, and reactive to light.  Cardiovascular:     Rate and Rhythm: Normal rate and regular rhythm.     Pulses: Normal pulses.     Heart sounds: Normal heart sounds. No murmur heard.   No gallop.  Pulmonary:     Effort: Pulmonary effort is normal. No respiratory distress.     Breath sounds: Normal breath sounds. No wheezing.  Abdominal:     General: Abdomen is flat. Bowel sounds are normal. There is no distension.     Palpations: Abdomen is soft.     Tenderness: There is no abdominal tenderness.  Musculoskeletal:        General: Normal range of  motion.     Cervical back: Normal range of motion.  Skin:    General: Skin is warm and dry.     Capillary Refill: Capillary refill takes less than 2 seconds.  Neurological:     General: No focal deficit present.     Mental Status: He is alert and oriented to person, place, and time. Mental status is at baseline.  Psychiatric:        Mood and Affect: Mood normal.        Thought Content: Thought content normal.        Judgment: Judgment normal.    BP 110/60   Pulse 67   Temp (!) 97.5 F (36.4 C)   Resp 16   Ht _0  (1.778 m)   Wt 149 lb (67.6 kg)   SpO2 98%   BMI 21.38 kg/m  Wt Readings from Last 3 Encounters:  01/07/21 149 lb (67.6 kg)  11/14/20 154 lb 6.4 oz (70 kg)  08/19/20 157 lb 9.6 oz (71.5 kg)     Health Maintenance Due  Topic Date Due   Zoster Vaccines- Shingrix (1 of 2) Never done   PNA vac Low Risk Adult (2 of 2 - PCV13) 03/15/2016   COVID-19 Vaccine (4 - Booster for Pfizer series) 08/30/2020    There are no preventive care reminders to display for this patient.  Lab Results  Component Value Date   TSH 2.450 04/15/2020   Lab Results  Component Value Date   WBC 6.0 08/19/2020   HGB 14.9 08/19/2020   HCT 43.3 08/19/2020   MCV 93 08/19/2020   PLT 141 (L) 08/19/2020   Lab Results  Component Value Date   NA 138 08/19/2020   K 4.6 08/19/2020   CHLORIDE 103 07/14/2016   CO2 23 08/19/2020   GLUCOSE 141 (H) 08/19/2020   BUN 11 08/19/2020   CREATININE 0.86 08/19/2020   BILITOT 1.3 (H) 08/19/2020   ALKPHOS 46 08/19/2020   AST 20 08/19/2020   ALT 14 08/19/2020   PROT 7.1 08/19/2020   ALBUMIN 4.2 08/19/2020   CALCIUM 9.8 08/19/2020   ANIONGAP 7 07/22/2020   EGFR 86 (L) 07/14/2016   Lab Results  Component Value Date  CHOL 200 (H) 08/19/2020   Lab Results  Component Value Date   HDL 68 08/19/2020   Lab Results  Component Value Date   LDLCALC 113 (H) 08/19/2020   Lab Results  Component Value Date   TRIG 108 08/19/2020   Lab Results   Component Value Date   CHOLHDL 2.9 08/19/2020   Lab Results  Component Value Date   HGBA1C 6.4 (H) 08/19/2020      Assessment & Plan:   Problem List Items Addressed This Visit       Cardiovascular and Mediastinum   Atherosclerotic heart disease of native coronary artery without angina pectoris An individual plan was formulated based on patient history and exam, labs and evidence based data. Patient has not had recent angina or nitroglycerin use. continue present treatment.     Congestive heart failure (HCC)   Relevant Orders   Comprehensive metabolic panel   CBC with Differential/Platelet An individualized care plan was established and reinforced.  The patient's disease status was assessed using clinical finding son exam today, labs, and/or other diagnostic testing such as x-rays, to determine the patient's success in meeting treatmentgoalsbased on disease-based guidelines and found to beimproving. But not at goal yet. Medications prescriptions no changes Laboratory tests ordered to be performed today include routine. RECOMMENDATIONS: given include see cardiology.  Call physician is patient gains 3 lbs in one day or 5 lbs for one week.  Call for progressive PND, orthopnea or increased pedal edema.     Type 2 diabetes mellitus with diabetic peripheral angiopathy without gangrene [facility] [place]) An individual care plan for diabetes was established and reinforced today.  The patient's status was assessed using clinical findings on exam, labs and diagnostic testing. Patient success at meeting goals based on disease specific evidence-based guidelines and found to be fair controlled. Medications were assessed and patient's understanding of the medical issues , including barriers were assessed. Recommend adherence to a diabetic diet, a graduated exercise program, HgbA1c level is checked quarterly, and urine microalbumin performed yearly .  Annual mono-filament sensation testing performed. Lower blood  pressure and control hyperlipidemia is important. Get annual eye exams and annual flu shots and smoking cessation discussed.  Self management goals were discussed.      Respiratory   Chronic obstructive pulmonary disease ([place]) An individualize plan was formulated for care of COPD.  Treatment is evidence based.  She will continue on inhalers, avoid smoking and smoke.  Regular exercise with help with dyspnea. Routine follow ups and medication compliance is needed.      Endocrine   Diabetic polyneuropathy ([place]) - Primary   Relevant Orders   Hemoglobin A1c   POCT UA - Microalbumin An individual care plan for diabetes was established and reinforced today.  The patient's status was assessed using clinical findings on exam, labs and diagnostic testing. Patient success at meeting goals based on disease specific evidence-based guidelines and found to be fair controlled. Medications were assessed and patient's understanding of the medical issues , including barriers were assessed. Recommend adherence to a diabetic diet, a graduated exercise program, HgbA1c level is checked quarterly, and urine microalbumin performed yearly .  Annual mono-filament sensation testing performed. Lower blood pressure and control hyperlipidemia is important. Get annual eye exams and annual flu shots and smoking cessation discussed.  Self management goals were discussed.      Genitourinary   Benign prostatic hyperplasia with lower urinary tract symptoms   Relevant Orders   PSA AN INDIVIDUAL CARE PLAN for BPH was  established and reinforced today.  The patient's status was assessed using clinical findings on exam, labs, and other diagnostic testing. Patient's success at meeting treatment goals based on disease specific evidence-bassed guidelines and found to be in good control. RECOMMENDATIONS include maintaining present medicines and treatment.      Hematopoietic and Hemostatic   Idiopathic thrombocytopenic purpura ([place]) This  is under control with Dr. Bobby Rumpf     Other   Mixed hyperlipidemia   Relevant Orders   Lipid panel   TSH AN INDIVIDUAL CARE PLAN for hyperlipidemia/ cholesterol was established and reinforced today.  The patient's status was assessed using clinical findings on exam, lab and other diagnostic tests. The patient's disease status was assessed based on evidence-based guidelines and found to be fair controlled. MEDICATIONS were reviewed. SELF MANAGEMENT GOALS have been discussed and patient's success at attaining the goal of low cholesterol was assessed. RECOMMENDATION given include regular exercise 3 days a week and low cholesterol/low fat diet. CLINICAL SUMMARY including written plan to identify barriers unique to the patient due to social or economic  reasons was discussed.     BMI 21.0-21.9, adult Supplement nutrition with protein/calorie supplement with meals to improve nutritional status.     Statin intolerance Patient has statin intolerance       Follow-up: Return in about 4 months (around 05/09/2021) for fasting.    Reinaldo Meeker, MD

## 2021-01-08 ENCOUNTER — Other Ambulatory Visit: Payer: Self-pay

## 2021-01-08 LAB — COMPREHENSIVE METABOLIC PANEL
ALT: 14 IU/L (ref 0–44)
AST: 19 IU/L (ref 0–40)
Albumin/Globulin Ratio: 1.5 (ref 1.2–2.2)
Albumin: 4.2 g/dL (ref 3.6–4.6)
Alkaline Phosphatase: 42 IU/L — ABNORMAL LOW (ref 44–121)
BUN/Creatinine Ratio: 15 (ref 10–24)
BUN: 13 mg/dL (ref 8–27)
Bilirubin Total: 1.4 mg/dL — ABNORMAL HIGH (ref 0.0–1.2)
CO2: 22 mmol/L (ref 20–29)
Calcium: 9.5 mg/dL (ref 8.6–10.2)
Chloride: 100 mmol/L (ref 96–106)
Creatinine, Ser: 0.89 mg/dL (ref 0.76–1.27)
Globulin, Total: 2.8 g/dL (ref 1.5–4.5)
Glucose: 131 mg/dL — ABNORMAL HIGH (ref 65–99)
Potassium: 4.4 mmol/L (ref 3.5–5.2)
Sodium: 140 mmol/L (ref 134–144)
Total Protein: 7 g/dL (ref 6.0–8.5)
eGFR: 86 mL/min/{1.73_m2} (ref >59)

## 2021-01-08 LAB — CBC WITH DIFFERENTIAL/PLATELET
Basophils Absolute: 0 10*3/uL (ref 0.0–0.2)
Basos: 1 %
EOS (ABSOLUTE): 0.1 10*3/uL (ref 0.0–0.4)
Eos: 2 %
Hematocrit: 39.3 % (ref 37.5–51.0)
Hemoglobin: 14 g/dL (ref 13.0–17.7)
Immature Grans (Abs): 0 10*3/uL (ref 0.0–0.1)
Immature Granulocytes: 0 %
Lymphocytes Absolute: 1.1 10*3/uL (ref 0.7–3.1)
Lymphs: 20 %
MCH: 33.7 pg — ABNORMAL HIGH (ref 26.6–33.0)
MCHC: 35.6 g/dL (ref 31.5–35.7)
MCV: 95 fL (ref 79–97)
Monocytes Absolute: 0.4 10*3/uL (ref 0.1–0.9)
Monocytes: 8 %
Neutrophils Absolute: 3.8 10*3/uL (ref 1.4–7.0)
Neutrophils: 69 %
Platelets: 131 10*3/uL — ABNORMAL LOW (ref 150–450)
RBC: 4.16 x10E6/uL (ref 4.14–5.80)
RDW: 12.4 % (ref 11.6–15.4)
WBC: 5.5 10*3/uL (ref 3.4–10.8)

## 2021-01-08 LAB — LIPID PANEL
Chol/HDL Ratio: 2.9 ratio (ref 0.0–5.0)
Cholesterol, Total: 186 mg/dL (ref 100–199)
HDL: 64 mg/dL (ref >39)
LDL Chol Calc (NIH): 106 mg/dL — ABNORMAL HIGH (ref 0–99)
Triglycerides: 90 mg/dL (ref 0–149)
VLDL Cholesterol Cal: 16 mg/dL (ref 5–40)

## 2021-01-08 LAB — CARDIOVASCULAR RISK ASSESSMENT

## 2021-01-08 LAB — TSH: TSH: 2.37 u[IU]/mL (ref 0.450–4.500)

## 2021-01-08 LAB — HEMOGLOBIN A1C
Est. average glucose Bld gHb Est-mCnc: 128 mg/dL
Hgb A1c MFr Bld: 6.1 % — ABNORMAL HIGH (ref 4.8–5.6)

## 2021-01-08 LAB — PSA: Prostate Specific Ag, Serum: 2.8 ng/mL (ref 0.0–4.0)

## 2021-01-08 NOTE — Progress Notes (Signed)
Glucose 131, kidney tests normal liver tests normal, bilirubin is still high suggest gall bladder ultrasound, A1c 6.1, LDLcholesterol high at 106, cbc ok still low platelets, TSH2.37 normal, PSA2,8 normal lp

## 2021-01-17 ENCOUNTER — Other Ambulatory Visit: Payer: Self-pay | Admitting: Legal Medicine

## 2021-02-03 ENCOUNTER — Ambulatory Visit
Admission: RE | Admit: 2021-02-03 | Discharge: 2021-02-03 | Disposition: A | Discharge status: Home / Self Care | Payer: Medicare HMO | Source: Ambulatory Visit | Attending: Legal Medicine | Admitting: Legal Medicine

## 2021-02-03 ENCOUNTER — Other Ambulatory Visit: Payer: Self-pay

## 2021-02-03 DIAGNOSIS — K8020 Calculus of gallbladder without cholecystitis without obstruction: Secondary | ICD-10-CM | POA: Diagnosis not present

## 2021-02-03 NOTE — Progress Notes (Signed)
Patient has gall stones and he is having pain, refer to general surgeon lp

## 2021-02-05 ENCOUNTER — Telehealth: Payer: Self-pay

## 2021-02-05 ENCOUNTER — Other Ambulatory Visit: Payer: Self-pay

## 2021-02-05 DIAGNOSIS — K8020 Calculus of gallbladder without cholecystitis without obstruction: Secondary | ICD-10-CM

## 2021-02-05 NOTE — Telephone Encounter (Signed)
Pt calling for [place] results. Made pt aware he has gallstones. Pt VU and states he is not having pain and saw this was documented. He would like to not be referred as he is not having pain.   [name] 02/05/21 3:34 PM

## 2021-02-05 NOTE — Telephone Encounter (Signed)
No treatment if no further pain but it is likely to recur lp

## 2021-02-06 NOTE — Telephone Encounter (Signed)
Closed referral per previous message

## 2021-02-06 NOTE — Telephone Encounter (Signed)
Order for referral was cancelled.

## 2021-02-10 ENCOUNTER — Telehealth: Payer: Self-pay | Admitting: *Deleted

## 2021-02-10 NOTE — Chronic Care Management (AMB) (Signed)
  Chronic Care Management   Note  02/10/2021 Name: KIOWA HOLLAR MRN: [id number] DOB: [birth year]  CONSTANTINE RUDDICK is a 80 y.o. year old male who is a primary care patient of Lillard Anes, MD. I reached out to [name] by phone today in response to a referral sent by Mr. Loney Laurence PCP, Dr. Henrene Pastor.      Mr. Luty was given information about Chronic Care Management services today including:  CCM service includes personalized support from designated clinical staff supervised by his physician, including individualized plan of care and coordination with other care providers 24/7 contact phone numbers for assistance for urgent and routine care needs. Service will only be billed when office clinical staff spend 20 minutes or more in a month to coordinate care. Only one practitioner may furnish and bill the service in a calendar month. The patient may stop CCM services at any time (effective at the end of the month) by phone call to the office staff. The patient will be responsible for cost sharing (co-pay) of up to 20% of the service fee (after annual deductible is met).  Patient did not agree to enrollment in care management services and does not wish to consider at this time.  Follow up plan: Patient declines further engagement by the care management team. Appropriate care team members and provider have been notified via electronic communication. The care management team is available to follow up with the patient after provider conversation with the patient regarding recommendation for care management engagement and subsequent re-referral to the care management team.   [facility]  Direct Dial: [phone]

## 2021-02-10 NOTE — Chronic Care Management (AMB) (Signed)
  Chronic Care Management   Outreach Note  02/10/2021 Name: [name] MRN: [id number] DOB: 06-08-1941  [name] is a 80 y.o. year old male who is a primary care patient of Lillard Anes, MD. I reached out to [name] by phone today in response to a referral sent by Mr. Loney Laurence PCP, Dr. Henrene Pastor.      An unsuccessful telephone outreach was attempted today. The patient was referred to the case management team for assistance with care management and care coordination.   Follow Up Plan: A HIPAA compliant phone message was left for the patient providing contact information and requesting a return call. The care management team will reach out to the patient again over the next 7 days.  If patient returns call to provider office, please advise to call [place] at [phone].  [facility]  Direct Dial: [phone]

## 2021-04-07 ENCOUNTER — Other Ambulatory Visit: Payer: Self-pay | Admitting: Legal Medicine

## 2021-04-07 NOTE — Telephone Encounter (Signed)
Refill sent to pharmacy.   

## 2021-05-13 ENCOUNTER — Other Ambulatory Visit: Payer: Self-pay

## 2021-05-13 ENCOUNTER — Ambulatory Visit (INDEPENDENT_AMBULATORY_CARE_PROVIDER_SITE_OTHER): Payer: Medicare HMO | Admitting: Legal Medicine

## 2021-05-13 ENCOUNTER — Encounter: Payer: Self-pay | Admitting: Legal Medicine

## 2021-05-13 VITALS — BP 124/80 | HR 69 | Temp 97.8°F | Resp 16 | Ht 70.0 in | Wt 151.0 lb

## 2021-05-13 DIAGNOSIS — K449 Diaphragmatic hernia without obstruction or gangrene: Secondary | ICD-10-CM

## 2021-05-13 DIAGNOSIS — D693 Immune thrombocytopenic purpura: Secondary | ICD-10-CM | POA: Diagnosis not present

## 2021-05-13 DIAGNOSIS — E1142 Type 2 diabetes mellitus with diabetic polyneuropathy: Secondary | ICD-10-CM

## 2021-05-13 DIAGNOSIS — I2510 Atherosclerotic heart disease of native coronary artery without angina pectoris: Secondary | ICD-10-CM

## 2021-05-13 DIAGNOSIS — N401 Enlarged prostate with lower urinary tract symptoms: Secondary | ICD-10-CM

## 2021-05-13 DIAGNOSIS — E1151 Type 2 diabetes mellitus with diabetic peripheral angiopathy without gangrene: Secondary | ICD-10-CM | POA: Diagnosis not present

## 2021-05-13 DIAGNOSIS — Z23 Encounter for immunization: Secondary | ICD-10-CM

## 2021-05-13 DIAGNOSIS — I5032 Chronic diastolic (congestive) heart failure: Secondary | ICD-10-CM

## 2021-05-13 DIAGNOSIS — Z789 Other specified health status: Secondary | ICD-10-CM

## 2021-05-13 DIAGNOSIS — J449 Chronic obstructive pulmonary disease, unspecified: Secondary | ICD-10-CM

## 2021-05-13 DIAGNOSIS — E782 Mixed hyperlipidemia: Secondary | ICD-10-CM

## 2021-05-13 DIAGNOSIS — I10 Essential (primary) hypertension: Secondary | ICD-10-CM

## 2021-05-13 LAB — POCT UA - MICROALBUMIN: Microalbumin Ur, POC: 10 mg/L

## 2021-05-13 NOTE — Progress Notes (Signed)
Established Patient Office Visit  Subjective:  Patient ID: [name], male    DOB: 08/05/1940  Age: 80 y.o. MRN: [id number]  CC:  Chief Complaint  Patient presents with   Diabetes   Hyperlipidemia   Benign Prostatic Hypertrophy    HPI [name] presents for chronic visit  CORONARY ARTERY DISEASE  Patient presents in follow up of CAD. Patient was diagnosed in metoprolol. The patient has no associated CHF. The patient is currently taking a beta blocker, statin, and aspirin. CAD was diagnosed 10 years ago.  Patient is having no angina. Patient has used no NTG.  Patient is followed by cardiology.  Patient had no stents . Last angiography was nana, last echocardiogram na.   Patient presents for follow up of hypertension.  Patient tolerating metoprolol well with side effects.  Patient was diagnosed with hypertension 2010 so has been treated for hypertension for 12 years.Patient is working on maintaining diet and exercise regimen and follows up as directed. Complication include none.   Patient presents with hyperlipidemia.  Compliance with treatment has been good; patient takes medicines as directed, maintains low cholesterol diet, follows up as directed, and maintains exercise regimen.  Patient is using zetia without problems.   Patient present with type 2 diabetes.  Specifically, this is type 2, noninsulin requiring diabetes, complicated by polyneuropathy.  Compliance with treatment has been good; patient take medicines as directed, maintains diet and exercise regimen, follows up as directed, and is keeping glucose diary.  Date of  diagnosis 2010.  Depression screen has been performed.Tobacco screen nonsmoker. Current medicines for diabetes metformin.  Patient is on none for renal protection and zetia for cholesterol control.  Patient performs foot exams daily and last ophthalmologic exam was done.   Past Medical History:  Diagnosis Date   Atherosclerotic heart disease of native  coronary artery without angina pectoris 02/12/2010   Qualifier: Diagnosis of  By: Marland Mcalpine     Benign prostatic hyperplasia with lower urinary tract symptoms 12/04/2019   CAD (coronary artery disease)    Hx of    Cellulitis 03/19/2012   Chronic obstructive pulmonary disease ([place]) 12/04/2019   Diverticulosis of colon (without mention of hemorrhage) 2011   Colonoscopy    Hiatal hernia 2011   EGD   Hypertension    Hypertensive heart disease without heart failure 12/04/2019   ITP (idiopathic thrombocytopenic purpura)    He has been diagnosed with   Pancytopenia    Thrombocytopenia, unspecified ([place]) 01/24/2013   TRANSAMINASES, SERUM, ELEVATED 02/12/2010   Qualifier: Diagnosis of  By: Marland Mcalpine     Type 2 diabetes mellitus with diabetic peripheral angiopathy without gangrene ([place]) 12/04/2019    Past Surgical History:  Procedure Laterality Date   CARDIAC CATHETERIZATION     His last heart catheterization in May of 2009 reveals a patent LIMA   CORONARY ANGIOPLASTY     Successful percutaneous transluminal coronary angioplasty of the left circumflex obuse marginal vessel   CORONARY ANGIOPLASTY WITH STENT PLACEMENT     CORONARY ARTERY BYPASS GRAFT     x4 with a left internal mammary artery anastomosis to the left anterior descending coronary artery    CORONARY STENT PLACEMENT     successful percutaneous transluminal coronary angioplasty and stenting of the left main coronary artery   HERNIA REPAIR     SAPHENOUS VEIN GRAFT RESECTION     graft to the first obtuse marginal, a saphenous vein graft to the first diagonal coronary  artery  and a saphenous vein graft to the distal right coronary artery   US ECHOCARDIOGRAPHY  02-08-2007   Est. EF 40-45%    Family History  Problem Relation Age of Onset   Cerebrovascular Accident Mother    Colon cancer Neg Hx     Social History   Socioeconomic History   Marital status: Married    Spouse name: Not on file   Number of children: 3    Years of education: Not on file   Highest education level: Not on file  Occupational History   Occupation: Retired   Tobacco Use   Smoking status: Never   Smokeless tobacco: Never  Vaping Use   Vaping Use: Never used  Substance and Sexual Activity   Alcohol use: No   Drug use: No   Sexual activity: Not Currently  Other Topics Concern   Not on file  Social History Narrative   Daily caffeine use    Social Determinants of Health   Financial Resource Strain: Not on file  Food Insecurity: Not on file  Transportation Needs: Not on file  Physical Activity: Not on file  Stress: Not on file  Social Connections: Not on file  Intimate Partner Violence: Not on file    Outpatient Medications Prior to Visit  Medication Sig Dispense Refill   cholecalciferol (VITAMIN D3) 25 MCG (1000 UT) tablet Take 5,000 Units by mouth daily.     clopidogrel (PLAVIX) 75 MG tablet TAKE 1 TABLET BY MOUTH DAILY 90 tablet 2   ezetimibe (ZETIA) 10 MG tablet Take 1 tablet (10 mg total) by mouth daily. 90 tablet 3   isosorbide mononitrate (IMDUR) 30 MG 24 hr tablet TAKE 1 TABLET BY MOUTH DAILY 90 tablet 2   Lancets MISC 1 each by Does not apply route daily as needed. Please provide lancets that fit patients lancet device 100 each 4   lisinopril (ZESTRIL) 5 MG tablet TAKE 1 TABLET BY MOUTH DAILY 90 tablet 2   metFORMIN (GLUCOPHAGE) 500 MG tablet TAKE 1 TABLET BY MOUTH 3 TIMES DAILY 270 tablet 2   metoprolol tartrate (LOPRESSOR) 25 MG tablet TAKE 1/2 TABLET BY MOUTH DAILY 45 tablet 3   Multiple Vitamin (MULTIVITAMIN) tablet Take 1 tablet by mouth daily.     nitroGLYCERIN (NITROSTAT) 0.4 MG SL tablet Place 1 tablet (0.4 mg total) under the tongue every 5 (five) minutes as needed. 25 tablet 6   ONETOUCH ULTRA test strip AS DIRECTED 100 each 4   No facility-administered medications prior to visit.    Allergies  Allergen Reactions   Lipitor [Atorvastatin Calcium] Other (See Comments)    Causes memory loss    Crestor [Rosuvastatin Calcium] Other (See Comments)    Causes Muscle Pain   Zocor [Simvastatin] Other (See Comments)    Causes muscle pain    ROS Review of Systems  Constitutional:  Negative for chills, fatigue, fever and unexpected weight change.  HENT:  Negative for congestion, ear pain, sinus pain and sore throat.   Eyes:  Positive for visual disturbance.  Respiratory:  Negative for cough and shortness of breath.   Cardiovascular:  Negative for chest pain and palpitations.  Gastrointestinal:  Negative for abdominal pain, blood in stool, constipation, diarrhea, nausea and vomiting.  Endocrine: Negative for polydipsia, polyphagia and polyuria.  Genitourinary:  Negative for dysuria.  Musculoskeletal:  Positive for arthralgias (Frozen shoulders) and back pain.  Skin:  Negative for rash.  Neurological:  Negative for dizziness and headaches.  Objective:    Physical Exam Vitals reviewed.  Constitutional:      General: He is not in acute distress.    Appearance: Normal appearance.  HENT:     Head: Normocephalic.     Right Ear: Tympanic membrane, ear canal and external ear normal.     Left Ear: Tympanic membrane, ear canal and external ear normal.     Mouth/Throat:     Mouth: Mucous membranes are moist.     Pharynx: Oropharynx is clear.  Eyes:     Extraocular Movements: Extraocular movements intact.     Conjunctiva/sclera: Conjunctivae normal.  Cardiovascular:     Rate and Rhythm: Normal rate and regular rhythm.     Pulses: Normal pulses.     Heart sounds: Normal heart sounds. No murmur heard.   No gallop.  Pulmonary:     Effort: Pulmonary effort is normal. No respiratory distress.     Breath sounds: Normal breath sounds. No wheezing.  Abdominal:     General: Abdomen is flat. Bowel sounds are normal. There is no distension.     Palpations: Abdomen is soft.     Tenderness: There is no abdominal tenderness.  Musculoskeletal:        General: Normal range of motion.      Cervical back: Normal range of motion.     Right lower leg: No edema.     Left lower leg: No edema.  Skin:    General: Skin is warm.     Capillary Refill: Capillary refill takes less than 2 seconds.  Neurological:     General: No focal deficit present.     Mental Status: He is alert and oriented to person, place, and time. Mental status is at baseline.     Motor: Weakness present.  Psychiatric:        Mood and Affect: Mood normal.        Thought Content: Thought content normal.    BP 124/80   Pulse 69   Temp 97.8 F (36.6 C)   Resp 16   Ht 5' 10"  (1.778 m)   Wt 151 lb (68.5 kg)   SpO2 97%   BMI 21.67 kg/m  Wt Readings from Last 3 Encounters:  05/13/21 151 lb (68.5 kg)  01/07/21 149 lb (67.6 kg)  11/14/20 154 lb 6.4 oz (70 kg)     Health Maintenance Due  Topic Date Due   Pneumonia Vaccine 60+ Years old (1 - PCV) 10/03/1946   Zoster Vaccines- Shingrix (1 of 2) Never done   COVID-19 Vaccine (4 - Booster for Pfizer series) 07/25/2020    There are no preventive care reminders to display for this patient.  Lab Results  Component Value Date   TSH 2.370 01/07/2021   Lab Results  Component Value Date   WBC 5.5 01/07/2021   HGB 14.0 01/07/2021   HCT 39.3 01/07/2021   MCV 95 01/07/2021   PLT 131 (L) 01/07/2021   Lab Results  Component Value Date   NA 140 01/07/2021   K 4.4 01/07/2021   CHLORIDE 103 07/14/2016   CO2 22 01/07/2021   GLUCOSE 131 (H) 01/07/2021   BUN 13 01/07/2021   CREATININE 0.89 01/07/2021   BILITOT 1.4 (H) 01/07/2021   ALKPHOS 42 (L) 01/07/2021   AST 19 01/07/2021   ALT 14 01/07/2021   PROT 7.0 01/07/2021   ALBUMIN 4.2 01/07/2021   CALCIUM 9.5 01/07/2021   ANIONGAP 7 07/22/2020   EGFR 86 01/07/2021   Lab Results  Component Value Date   CHOL 186 01/07/2021   Lab Results  Component Value Date   HDL 64 01/07/2021   Lab Results  Component Value Date   LDLCALC 106 (H) 01/07/2021   Lab Results  Component Value Date   TRIG 90  01/07/2021   Lab Results  Component Value Date   CHOLHDL 2.9 01/07/2021   Lab Results  Component Value Date   HGBA1C 6.1 (H) 01/07/2021      Assessment & Plan:   Problem List Items Addressed This Visit       Cardiovascular and Mediastinum   Atherosclerotic heart disease of native coronary artery without angina pectoris   Relevant Orders   AMB Referral to [place] An individual plan was formulated based on patient history and exam, labs and evidence based data. Patient has not had recent angina or nitroglycerin use. continue present treatment.    Benign essential HTN   Relevant Orders   Comprehensive metabolic panel   CBC with Differential/Platelet   AMB Referral to [place] An individual hypertension care plan was established and reinforced today.  The patient's status was assessed using clinical findings on exam and labs or diagnostic tests. The patient's success at meeting treatment goals on disease specific evidence-based guidelines and found to be well controlled. SELF MANAGEMENT: The patient and I together assessed ways to personally work towards obtaining the recommended goals. RECOMMENDATIONS: avoid decongestants found in common cold remedies, decrease consumption of alcohol, perform routine monitoring of BP with home BP cuff, exercise, reduction of dietary salt, take medicines as prescribed, try not to miss doses and quit smoking.  Regular exercise and maintaining a healthy weight is needed.  Stress reduction may help. A CLINICAL SUMMARY including written plan identify barriers to care unique to individual due to social or financial issues.  We attempt to mutually creat solutions for individual and family understanding.    Congestive heart failure ([place])   Relevant Orders   AMB Referral to [place] An individualized care plan was established and reinforced.  The patient's disease status was assessed using clinical finding  son exam today, labs, and/or other diagnostic testing such as x-rays, to determine the patient's success in meeting treatmentgoalsbased on disease-based guidelines and found to be improving. But not at goal yet. Medications prescriptions no changes Laboratory tests ordered to be performed today include routine lab. RECOMMENDATIONS: given include see cardiology.  Call physician is patient gains 3 lbs in one day or 5 lbs for one week.  Call for progressive PND, orthopnea or increased pedal edema.     Type 2 diabetes mellitus with diabetic peripheral angiopathy without gangrene ([place])   Relevant Orders   AMB Referral to [place] An individual care plan for diabetes was established and reinforced today.  The patient's status was assessed using clinical findings on exam, labs and diagnostic testing. Patient success at meeting goals based on disease specific evidence-based guidelines and found to be good controlled. Medications were assessed and patient's understanding of the medical issues , including barriers were assessed. Recommend adherence to a diabetic diet, a graduated exercise program, HgbA1c level is checked quarterly, and urine microalbumin performed yearly .  Annual mono-filament sensation testing performed. Lower blood pressure and control hyperlipidemia is important. Get annual eye exams and annual flu shots and smoking cessation discussed.  Self management goals were discussed.      Respiratory   Diaphragmatic hernia AN INDIVIDUAL CARE PLAN diaphramatic hernia was established and reinforced  today.  The patient's status was assessed using clinical findings on exam, labs, and other diagnostic testing. Patient's success at meeting treatment goals based on disease specific evidence-bassed guidelines and found to be in good control. RECOMMENDATIONS include maintaining present medicines and treatment.     Chronic obstructive pulmonary disease (HCC)   Relevant Orders   AMB  Referral to [facility] An individualize plan was formulated for care of COPD.  Treatment is evidence based.  She will continue on inhalers, avoid smoking and smoke.  Regular exercise with help with dyspnea. Routine follow ups and medication compliance is needed.      Endocrine   Diabetic polyneuropathy ([place])   Relevant Orders   Hemoglobin A1c   POCT UA - Microalbumin (Completed)   AMB Referral to [place] An individual care plan for diabetes was established and reinforced today.  The patient's status was assessed using clinical findings on exam, labs and diagnostic testing. Patient success at meeting goals based on disease specific evidence-based guidelines and found to be good controlled. Medications were assessed and patient's understanding of the medical issues , including barriers were assessed. Recommend adherence to a diabetic diet, a graduated exercise program, HgbA1c level is checked quarterly, and urine microalbumin performed yearly .  Annual mono-filament sensation testing performed. Lower blood pressure and control hyperlipidemia is important. Get annual eye exams and annual flu shots and smoking cessation discussed.  Self management goals were discussed.      Genitourinary   Benign prostatic hyperplasia with lower urinary tract symptoms   Relevant Orders   PSA AN INDIVIDUAL CARE PLAN for BPH was established and reinforced today.  The patient's status was assessed using clinical findings on exam, labs, and other diagnostic testing. Patient's success at meeting treatment goals based on disease specific evidence-bassed guidelines and found to be in good control. RECOMMENDATIONS include maintaing present medicines and treatment.      Hematopoietic and Hemostatic   Idiopathic thrombocytopenic purpura ([place]) AN INDIVIDUAL CARE PLAN TTP was established and reinforced today.  The patient's status was assessed using clinical findings on exam, labs, and other  diagnostic testing. Patient's success at meeting treatment goals based on disease specific evidence-bassed guidelines and found to be in under good control control. RECOMMENDATIONS include maintaining present medicines and treatment.      Other   Mixed hyperlipidemia   Relevant Orders   Lipid panel AN INDIVIDUAL CARE PLAN for hyperlipidemia/ cholesterol was established and reinforced today.  The patient's status was assessed using clinical findings on exam, lab and other diagnostic tests. The patient's disease status was assessed based on evidence-based guidelines and found to be well controlled. MEDICATIONS were reviewed. SELF MANAGEMENT GOALS have been discussed and patient's success at attaining the goal of low cholesterol was assessed. RECOMMENDATION given include regular exercise 3 days a week and low cholesterol/low fat diet. CLINICAL SUMMARY including written plan to identify barriers unique to the patient due to social or economic  reasons was discussed.    Statin intolerance Patient unable to tak statins   Other Visit Diagnoses     Need for influenza vaccination    -  Primary   Relevant Orders   Flu Vaccine QUAD High Dose(Fluad) (Completed)   30 minute visit and review of records       Follow-up: Return in about 4 months (around 09/10/2021) for fasting.    Reinaldo Meeker, MD

## 2021-05-14 LAB — CBC WITH DIFFERENTIAL/PLATELET
Basophils Absolute: 0 10*3/uL (ref 0.0–0.2)
Basos: 1 %
EOS (ABSOLUTE): 0.1 10*3/uL (ref 0.0–0.4)
Eos: 2 %
Hematocrit: 39.6 % (ref 37.5–51.0)
Hemoglobin: 13.8 g/dL (ref 13.0–17.7)
Immature Grans (Abs): 0 10*3/uL (ref 0.0–0.1)
Immature Granulocytes: 0 %
Lymphocytes Absolute: 1.2 10*3/uL (ref 0.7–3.1)
Lymphs: 24 %
MCH: 33 pg (ref 26.6–33.0)
MCHC: 34.8 g/dL (ref 31.5–35.7)
MCV: 95 fL (ref 79–97)
Monocytes Absolute: 0.5 10*3/uL (ref 0.1–0.9)
Monocytes: 9 %
Neutrophils Absolute: 3.4 10*3/uL (ref 1.4–7.0)
Neutrophils: 64 %
Platelets: 126 10*3/uL — ABNORMAL LOW (ref 150–450)
RBC: 4.18 x10E6/uL (ref 4.14–5.80)
RDW: 12.2 % (ref 11.6–15.4)
WBC: 5.3 10*3/uL (ref 3.4–10.8)

## 2021-05-14 LAB — COMPREHENSIVE METABOLIC PANEL
ALT: 14 IU/L (ref 0–44)
AST: 23 IU/L (ref 0–40)
Albumin/Globulin Ratio: 1.8 (ref 1.2–2.2)
Albumin: 4.3 g/dL (ref 3.6–4.6)
Alkaline Phosphatase: 41 IU/L — ABNORMAL LOW (ref 44–121)
BUN/Creatinine Ratio: 12 (ref 10–24)
BUN: 10 mg/dL (ref 8–27)
Bilirubin Total: 1.2 mg/dL (ref 0.0–1.2)
CO2: 23 mmol/L (ref 20–29)
Calcium: 9.8 mg/dL (ref 8.6–10.2)
Chloride: 100 mmol/L (ref 96–106)
Creatinine, Ser: 0.83 mg/dL (ref 0.76–1.27)
Globulin, Total: 2.4 g/dL (ref 1.5–4.5)
Glucose: 130 mg/dL — ABNORMAL HIGH (ref 70–99)
Potassium: 4.1 mmol/L (ref 3.5–5.2)
Sodium: 136 mmol/L (ref 134–144)
Total Protein: 6.7 g/dL (ref 6.0–8.5)
eGFR: 87 mL/min/{1.73_m2} (ref >59)

## 2021-05-14 LAB — LIPID PANEL
Chol/HDL Ratio: 3 ratio (ref 0.0–5.0)
Cholesterol, Total: 183 mg/dL (ref 100–199)
HDL: 61 mg/dL (ref >39)
LDL Chol Calc (NIH): 104 mg/dL — ABNORMAL HIGH (ref 0–99)
Triglycerides: 100 mg/dL (ref 0–149)
VLDL Cholesterol Cal: 18 mg/dL (ref 5–40)

## 2021-05-14 LAB — HEMOGLOBIN A1C
Est. average glucose Bld gHb Est-mCnc: 126 mg/dL
Hgb A1c MFr Bld: 6 % — ABNORMAL HIGH (ref 4.8–5.6)

## 2021-05-14 LAB — CARDIOVASCULAR RISK ASSESSMENT

## 2021-05-14 LAB — PSA: Prostate Specific Ag, Serum: 2.5 ng/mL (ref 0.0–4.0)

## 2021-05-14 NOTE — Progress Notes (Signed)
Glucose 130, kidney tests normal, liver tests normal, A1c 6.0, cholesterol LDL cholesterol 104 cbc normal, platelets low but chronic, PSA 2.5 normal,  lp

## 2021-05-15 ENCOUNTER — Telehealth: Payer: Self-pay | Admitting: *Deleted

## 2021-05-15 NOTE — Chronic Care Management (AMB) (Signed)
  Chronic Care Management   Note  05/15/2021 Name: [name] MRN: [id number] DOB: [birth year]  [name] is a 80 y.o. year old male who is a primary care patient of Lillard Anes, MD. I reached out to [name] by phone today in response to a referral sent by [name] PCP.  [name] was given information about Chronic Care Management services today including:  CCM service includes personalized support from designated clinical staff supervised by his physician, including individualized plan of care and coordination with other care providers 24/7 contact phone numbers for assistance for urgent and routine care needs. Service will only be billed when office clinical staff spend 20 minutes or more in a month to coordinate care. Only one practitioner may furnish and bill the service in a calendar month. The patient may stop CCM services at any time (effective at the end of the month) by phone call to the office staff. The patient is responsible for co-pay (up to 20% after annual deductible is met) if co-pay is required by the individual health plan.   Patient did not agree to enrollment in care management services and does not wish to consider at this time.  Follow up plan: Patient declines engagement by the care management team. Appropriate care team members and provider have been notified via electronic communication. The care management team is available to follow up with the patient after provider conversation with the patient regarding recommendation for care management engagement and subsequent re-referral to the care management team.   [facility]  Direct Dial: [phone]

## 2021-05-29 ENCOUNTER — Other Ambulatory Visit: Payer: Self-pay | Admitting: Cardiovascular Disease

## 2021-06-30 ENCOUNTER — Other Ambulatory Visit: Payer: Self-pay | Admitting: Cardiovascular Disease

## 2021-07-08 DIAGNOSIS — H43393 Other vitreous opacities, bilateral: Secondary | ICD-10-CM | POA: Diagnosis not present

## 2021-07-08 DIAGNOSIS — H47211 Primary optic atrophy, right eye: Secondary | ICD-10-CM | POA: Diagnosis not present

## 2021-07-08 DIAGNOSIS — H353110 Nonexudative age-related macular degeneration, right eye, stage unspecified: Secondary | ICD-10-CM | POA: Diagnosis not present

## 2021-07-08 DIAGNOSIS — H524 Presbyopia: Secondary | ICD-10-CM | POA: Diagnosis not present

## 2021-07-08 DIAGNOSIS — Z7984 Long term (current) use of oral hypoglycemic drugs: Secondary | ICD-10-CM | POA: Diagnosis not present

## 2021-07-08 DIAGNOSIS — Z961 Presence of intraocular lens: Secondary | ICD-10-CM | POA: Diagnosis not present

## 2021-07-08 DIAGNOSIS — E119 Type 2 diabetes mellitus without complications: Secondary | ICD-10-CM | POA: Diagnosis not present

## 2021-07-16 ENCOUNTER — Encounter: Payer: Self-pay | Admitting: Sports Medicine

## 2021-07-16 ENCOUNTER — Ambulatory Visit: Payer: Medicare HMO | Admitting: Sports Medicine

## 2021-07-16 ENCOUNTER — Other Ambulatory Visit: Payer: Self-pay

## 2021-07-16 DIAGNOSIS — L84 Corns and callosities: Secondary | ICD-10-CM | POA: Diagnosis not present

## 2021-07-16 DIAGNOSIS — E1142 Type 2 diabetes mellitus with diabetic polyneuropathy: Secondary | ICD-10-CM | POA: Diagnosis not present

## 2021-07-16 DIAGNOSIS — E119 Type 2 diabetes mellitus without complications: Secondary | ICD-10-CM | POA: Diagnosis not present

## 2021-07-16 DIAGNOSIS — M79609 Pain in unspecified limb: Secondary | ICD-10-CM | POA: Diagnosis not present

## 2021-07-16 DIAGNOSIS — B351 Tinea unguium: Secondary | ICD-10-CM

## 2021-07-16 NOTE — Progress Notes (Signed)
Subjective: [name] is a 81 y.o. male patient with history of diabetes who presents to office today complaining of long,mildly painful nails and callus while ambulating in shoes; unable to trim. Patient states that sometimes his calluses get big and his PCP sometimes works on it.  Reports that he has diabetic shoes.  Patient denies any new changes in medication or new problems.   Fasting blood sugar this morning 120 Last PCP visit November Last A1c 6  Patient Active Problem List   Diagnosis Date Noted   Generalized weakness 11/14/2020   Statin intolerance 04/15/2020   Diabetic polyneuropathy ([place]) 12/05/2019   BMI 21.0-21.9, adult 12/05/2019   Benign prostatic hyperplasia with lower urinary tract symptoms 12/04/2019   Type 2 diabetes mellitus with diabetic peripheral angiopathy without gangrene ([place]) 12/04/2019   Chronic obstructive pulmonary disease ([place]) 12/04/2019   Mixed hyperlipidemia 01/29/2017   Idiopathic thrombocytopenic purpura ([place]) 01/26/2012   Congestive heart failure ([place]) 09/29/2011   Benign essential HTN 07/08/2010   DIVERTICULOSIS-COLON 05/12/2010   Diaphragmatic hernia 03/25/2010   Atherosclerotic heart disease of native coronary artery without angina pectoris 02/12/2010   Current Outpatient Medications on File Prior to Visit  Medication Sig Dispense Refill   cholecalciferol (VITAMIN D3) 25 MCG (1000 UT) tablet Take 5,000 Units by mouth daily.     clopidogrel (PLAVIX) 75 MG tablet TAKE 1 TABLET BY MOUTH DAILY 90 tablet 1   ezetimibe (ZETIA) 10 MG tablet Take 1 tablet (10 mg total) by mouth daily. 90 tablet 3   isosorbide mononitrate (IMDUR) 30 MG 24 hr tablet TAKE 1 TABLET BY MOUTH DAILY 90 tablet 1   Lancets MISC 1 each by Does not apply route daily as needed. Please provide lancets that fit patients lancet device 100 each 4   lisinopril (ZESTRIL) 5 MG tablet TAKE 1 TABLET BY MOUTH DAILY 90 tablet 2   metFORMIN (GLUCOPHAGE) 500 MG tablet TAKE 1 TABLET BY  MOUTH 3 TIMES DAILY 270 tablet 2   metoprolol tartrate (LOPRESSOR) 25 MG tablet TAKE 1/2 TABLET BY MOUTH DAILY 45 tablet 3   Multiple Vitamin (MULTIVITAMIN) tablet Take 1 tablet by mouth daily.     nitroGLYCERIN (NITROSTAT) 0.4 MG SL tablet PLACE 1 TABLET UNDER THE TONGUE EVERY 5 MINUTES AS NEEDED 25 tablet 6   ONETOUCH ULTRA test strip AS DIRECTED 100 each 4   No current facility-administered medications on file prior to visit.   Allergies  Allergen Reactions   Lipitor [Atorvastatin Calcium] Other (See Comments)    Causes memory loss   Crestor [Rosuvastatin Calcium] Other (See Comments)    Causes Muscle Pain   Zocor [Simvastatin] Other (See Comments)    Causes muscle pain    Recent Results (from the past 2160 hour(s))  Comprehensive metabolic panel     Status: Abnormal   Collection Time: 05/13/21  9:13 AM  Result Value Ref Range   Glucose 130 (H) 70 - 99 mg/dL   BUN 10 8 - 27 mg/dL   Creatinine, Ser 0.83 0.76 - 1.27 mg/dL   eGFR 87 >59 mL/min/1.73   BUN/Creatinine Ratio 12 10 - 24   Sodium 136 134 - 144 mmol/L   Potassium 4.1 3.5 - 5.2 mmol/L   Chloride 100 96 - 106 mmol/L   CO2 23 20 - 29 mmol/L   Calcium 9.8 8.6 - 10.2 mg/dL   Total Protein 6.7 6.0 - 8.5 g/dL   Albumin 4.3 3.6 - 4.6 g/dL   Globulin, Total 2.4 1.5 - 4.5  g/dL   Albumin/Globulin Ratio 1.8 1.2 - 2.2   Bilirubin Total 1.2 0.0 - 1.2 mg/dL   Alkaline Phosphatase 41 (L) 44 - 121 IU/L   AST 23 0 - 40 IU/L   ALT 14 0 - 44 IU/L  Hemoglobin A1c     Status: Abnormal   Collection Time: 05/13/21  9:13 AM  Result Value Ref Range   Hgb A1c MFr Bld 6.0 (H) 4.8 - 5.6 %    Comment:          Prediabetes: 5.7 - 6.4          Diabetes: >6.4          Glycemic control for adults with diabetes: <7.0    Est. average glucose Bld gHb Est-mCnc 126 mg/dL  Lipid panel     Status: Abnormal   Collection Time: 05/13/21  9:13 AM  Result Value Ref Range   Cholesterol, Total 183 100 - 199 mg/dL   Triglycerides 100 0 - 149 mg/dL    HDL 61 >39 mg/dL   VLDL Cholesterol Cal 18 5 - 40 mg/dL   LDL Chol Calc (NIH) 104 (H) 0 - 99 mg/dL   Chol/HDL Ratio 3.0 0.0 - 5.0 ratio    Comment:                                   T. Chol/HDL Ratio                                             Men  Women                               1/2 Avg.Risk  3.4    3.3                                   Avg.Risk  5.0    4.4                                2X Avg.Risk  9.6    7.1                                3X Avg.Risk 23.4   11.0   CBC with Differential/Platelet     Status: Abnormal   Collection Time: 05/13/21  9:13 AM  Result Value Ref Range   WBC 5.3 3.4 - 10.8 x10E3/uL   RBC 4.18 4.14 - 5.80 x10E6/uL   Hemoglobin 13.8 13.0 - 17.7 g/dL   Hematocrit 39.6 37.5 - 51.0 %   MCV 95 79 - 97 fL   MCH 33.0 26.6 - 33.0 pg   MCHC 34.8 31.5 - 35.7 g/dL   RDW 12.2 11.6 - 15.4 %   Platelets 126 (L) 150 - 450 x10E3/uL   Neutrophils 64 Not Estab. %   Lymphs 24 Not Estab. %   Monocytes 9 Not Estab. %   Eos 2 Not Estab. %   Basos 1 Not Estab. %   Neutrophils Absolute 3.4 1.4 - 7.0 x10E3/uL   Lymphocytes Absolute 1.2 0.7 -  3.1 x10E3/uL   Monocytes Absolute 0.5 0.1 - 0.9 x10E3/uL   EOS (ABSOLUTE) 0.1 0.0 - 0.4 x10E3/uL   Basophils Absolute 0.0 0.0 - 0.2 x10E3/uL   Immature Granulocytes 0 Not Estab. %   Immature Grans (Abs) 0.0 0.0 - 0.1 x10E3/uL  PSA     Status: None   Collection Time: 05/13/21  9:13 AM  Result Value Ref Range   Prostate Specific Ag, Serum 2.5 0.0 - 4.0 ng/mL    Comment: Roche ECLIA methodology. According to the American Urological Association, Serum PSA should decrease and remain at undetectable levels after radical prostatectomy. The AUA defines biochemical recurrence as an initial PSA value 0.2 ng/mL or greater followed by a subsequent confirmatory PSA value 0.2 ng/mL or greater. Values obtained with different assay methods or kits cannot be used interchangeably. Results cannot be interpreted as absolute evidence of the  presence or absence of malignant disease.   Cardiovascular Risk Assessment     Status: None   Collection Time: 05/13/21  9:13 AM  Result Value Ref Range   Interpretation Note     Comment: Supplemental report is available.  POCT UA - Microalbumin     Status: Normal   Collection Time: 05/13/21  9:23 AM  Result Value Ref Range   Microalbumin Ur, POC 10 mg/L   Creatinine, POC     Albumin/Creatinine Ratio, Urine, POC      Objective: General: Patient is awake, alert, and oriented x 3 and in no acute distress.  Integument: Skin is warm, dry and supple bilateral. Nails are tender, long, thickened and  dystrophic with subungual debris, consistent with onychomycosis, 1-5 bilateral.  Callus present bilateral fifth metatarsal heads and plantar right forefoot sub met 3. Remaining integument unremarkable.  Vasculature:  Dorsalis Pedis pulse 1/4 bilateral. Posterior Tibial pulse  0/4 bilateral.  Capillary fill time <3 sec 1-5 bilateral.  No hair growth to the level of the digits. Temperature gradient within normal limits.  Brawny hyperpigmentation and mild varicosities present bilateral. No edema present bilateral.   Neurology: The patient has intact sensation measured with a 5.07/10g Semmes Weinstein Monofilament at all pedal sites bilateral . Vibratory sensation diminished bilateral with tuning fork. No Babinski sign present bilateral.   Musculoskeletal: Asymptomatic hammertoe bunion and pes planus deformity with fat pad atrophy pedal deformities noted bilateral. Muscular strength 5/5 in all lower extremity muscular groups bilateral without pain on range of motion .  Mild tenderness to callused areas.  No tenderness with calf compression bilateral.  Assessment and Plan: Problem List Items Addressed This Visit       Endocrine   Diabetic polyneuropathy ([place])   Other Visit Diagnoses     Pain due to onychomycosis of nail    -  Primary   Callus       Encounter for diabetic foot exam ([place])            -Examined patient. -Discussed and educated patient on diabetic foot care, especially with  regards to the vascular, neurological and musculoskeletal systems.  -Stressed the importance of good glycemic control and the detriment of not  controlling glucose levels in relation to the foot. -Mechanically debrided all painful nails 1-5 bilateral using sterile nail nipper and filed with dremel without incident  -Mechanically debrided all painful keratotic lesions x4 using a sterile 15 blade without incident -Sample of foot miracle cream provided -Recommend continue with diabetic shoes that are supportive for foot type -Answered all patient questions -Patient to return  in 3  months for at risk foot care -Patient advised to call the office if any problems or questions arise in the meantime.  Landis Martins, DPM

## 2021-07-22 ENCOUNTER — Inpatient Hospital Stay: Payer: Medicare HMO | Attending: Internal Medicine

## 2021-07-22 ENCOUNTER — Inpatient Hospital Stay: Payer: Medicare HMO | Admitting: Internal Medicine

## 2021-07-22 ENCOUNTER — Other Ambulatory Visit: Payer: Self-pay

## 2021-07-22 VITALS — BP 136/75 | HR 72 | Temp 97.1°F | Resp 17 | Wt 149.3 lb

## 2021-07-22 DIAGNOSIS — D693 Immune thrombocytopenic purpura: Secondary | ICD-10-CM

## 2021-07-22 DIAGNOSIS — D649 Anemia, unspecified: Secondary | ICD-10-CM | POA: Insufficient documentation

## 2021-07-22 DIAGNOSIS — Z79899 Other long term (current) drug therapy: Secondary | ICD-10-CM | POA: Insufficient documentation

## 2021-07-22 DIAGNOSIS — D696 Thrombocytopenia, unspecified: Secondary | ICD-10-CM

## 2021-07-22 LAB — CBC WITH DIFFERENTIAL (CANCER CENTER ONLY)
Abs Immature Granulocytes: 0.02 10*3/uL (ref 0.00–0.07)
Basophils Absolute: 0 10*3/uL (ref 0.0–0.1)
Basophils Relative: 1 %
Eosinophils Absolute: 0.2 10*3/uL (ref 0.0–0.5)
Eosinophils Relative: 3 %
HCT: 35.9 % — ABNORMAL LOW (ref 39.0–52.0)
Hemoglobin: 12.5 g/dL — ABNORMAL LOW (ref 13.0–17.0)
Immature Granulocytes: 0 %
Lymphocytes Relative: 24 %
Lymphs Abs: 1.5 10*3/uL (ref 0.7–4.0)
MCH: 32.6 pg (ref 26.0–34.0)
MCHC: 34.8 g/dL (ref 30.0–36.0)
MCV: 93.5 fL (ref 80.0–100.0)
Monocytes Absolute: 0.6 10*3/uL (ref 0.1–1.0)
Monocytes Relative: 9 %
Neutro Abs: 4 10*3/uL (ref 1.7–7.7)
Neutrophils Relative %: 63 %
Platelet Count: 156 10*3/uL (ref 150–400)
RBC: 3.84 MIL/uL — ABNORMAL LOW (ref 4.22–5.81)
RDW: 12.4 % (ref 11.5–15.5)
WBC Count: 6.3 10*3/uL (ref 4.0–10.5)
nRBC: 0 % (ref 0.0–0.2)

## 2021-07-22 LAB — CMP (CANCER CENTER ONLY)
ALT: 11 U/L (ref 0–44)
AST: 18 U/L (ref 15–41)
Albumin: 3.8 g/dL (ref 3.5–5.0)
Alkaline Phosphatase: 41 U/L (ref 38–126)
Anion gap: 6 (ref 5–15)
BUN: 13 mg/dL (ref 8–23)
CO2: 29 mmol/L (ref 22–32)
Calcium: 9.6 mg/dL (ref 8.9–10.3)
Chloride: 101 mmol/L (ref 98–111)
Creatinine: 0.78 mg/dL (ref 0.61–1.24)
GFR, Estimated: >60 mL/min (ref >60)
Glucose, Bld: 184 mg/dL — ABNORMAL HIGH (ref 70–99)
Potassium: 4.5 mmol/L (ref 3.5–5.1)
Sodium: 136 mmol/L (ref 135–145)
Total Bilirubin: 0.8 mg/dL (ref 0.3–1.2)
Total Protein: 7.1 g/dL (ref 6.5–8.1)

## 2021-07-22 LAB — LACTATE DEHYDROGENASE: LDH: 140 U/L (ref 98–192)

## 2021-07-22 NOTE — Progress Notes (Signed)
[facility] [phone]   Fax:([phone]  OFFICE PROGRESS NOTE  Caleb Anes, MD [place] [id number]  DIAGNOSIS: Idiopathic thrombocytopenic purpura   PRIOR THERAPY: None   CURRENT THERAPY: Observation.  INTERVAL HISTORY: [name] 81 y.o. male returns to the clinic today for follow-up visit accompanied by his son.  The patient is feeling fine today with no concerning complaints.  He denied having any chest pain, shortness of breath, cough or hemoptysis.  He denied having any fever or chills.  He has no nausea, vomiting, diarrhea or constipation.  He has no headache or visual changes.  He has no bleeding, bruises or ecchymosis.  He is here today for evaluation with repeat blood work for close monitoring of his ITP.   MEDICAL HISTORY: Past Medical History:  Diagnosis Date   Atherosclerotic heart disease of native coronary artery without angina pectoris 02/12/2010   Qualifier: Diagnosis of  By: Marland Mcalpine     Benign prostatic hyperplasia with lower urinary tract symptoms 12/04/2019   CAD (coronary artery disease)    Hx of    Cellulitis 03/19/2012   Chronic obstructive pulmonary disease ([place]) 12/04/2019   Diverticulosis of colon (without mention of hemorrhage) 2011   Colonoscopy    Hiatal hernia 2011   EGD   Hypertension    Hypertensive heart disease without heart failure 12/04/2019   ITP (idiopathic thrombocytopenic purpura)    He has been diagnosed with   Pancytopenia    Thrombocytopenia, unspecified ([place]) 01/24/2013   TRANSAMINASES, SERUM, ELEVATED 02/12/2010   Qualifier: Diagnosis of  By: Marland Mcalpine     Type 2 diabetes mellitus with diabetic peripheral angiopathy without gangrene ([place]) 12/04/2019    ALLERGIES:  is allergic to lipitor [atorvastatin calcium], crestor [rosuvastatin calcium], and zocor [simvastatin].  MEDICATIONS:  Current Outpatient Medications  Medication Sig Dispense Refill    cholecalciferol (VITAMIN D3) 25 MCG (1000 UT) tablet Take 5,000 Units by mouth daily.     clopidogrel (PLAVIX) 75 MG tablet TAKE 1 TABLET BY MOUTH DAILY 90 tablet 1   ezetimibe (ZETIA) 10 MG tablet Take 1 tablet (10 mg total) by mouth daily. 90 tablet 3   isosorbide mononitrate (IMDUR) 30 MG 24 hr tablet TAKE 1 TABLET BY MOUTH DAILY 90 tablet 1   Lancets MISC 1 each by Does not apply route daily as needed. Please provide lancets that fit patients lancet device 100 each 4   lisinopril (ZESTRIL) 5 MG tablet TAKE 1 TABLET BY MOUTH DAILY 90 tablet 2   metFORMIN (GLUCOPHAGE) 500 MG tablet TAKE 1 TABLET BY MOUTH 3 TIMES DAILY 270 tablet 2   metoprolol tartrate (LOPRESSOR) 25 MG tablet TAKE 1/2 TABLET BY MOUTH DAILY 45 tablet 3   Multiple Vitamin (MULTIVITAMIN) tablet Take 1 tablet by mouth daily.     nitroGLYCERIN (NITROSTAT) 0.4 MG SL tablet PLACE 1 TABLET UNDER THE TONGUE EVERY 5 MINUTES AS NEEDED 25 tablet 6   ONETOUCH ULTRA test strip AS DIRECTED 100 each 4   No current facility-administered medications for this visit.    SURGICAL HISTORY:  Past Surgical History:  Procedure Laterality Date   CARDIAC CATHETERIZATION     His last heart catheterization in May of 2009 reveals a patent LIMA   CORONARY ANGIOPLASTY     Successful percutaneous transluminal coronary angioplasty of the left circumflex obuse marginal vessel   CORONARY ANGIOPLASTY WITH STENT PLACEMENT     CORONARY ARTERY BYPASS  GRAFT     x4 with a left internal mammary artery anastomosis to the left anterior descending coronary artery    CORONARY STENT PLACEMENT     successful percutaneous transluminal coronary angioplasty and stenting of the left main coronary artery   HERNIA REPAIR     SAPHENOUS VEIN GRAFT RESECTION     graft to the first obtuse marginal, a saphenous vein graft to the first diagonal coronary artery  and a saphenous vein graft to the distal right coronary artery   US ECHOCARDIOGRAPHY  02-08-2007   Est. EF 40-45%     REVIEW OF SYSTEMS:  A comprehensive review of systems was negative.   PHYSICAL EXAMINATION: General appearance: alert, cooperative, and no distress Head: Normocephalic, without obvious abnormality, atraumatic Neck: no adenopathy Lymph nodes: Cervical, supraclavicular, and axillary nodes normal. Resp: clear to auscultation bilaterally Back: symmetric, no curvature. ROM normal. No CVA tenderness. Cardio: regular rate and rhythm, S1, S2 normal, no murmur, click, rub or gallop GI: soft, non-tender; bowel sounds normal; no masses,  no organomegaly Extremities: extremities normal, atraumatic, no cyanosis or edema  ECOG PERFORMANCE STATUS: 0 - Asymptomatic  Blood pressure 136/75, pulse 72, temperature (!) 97.1 F (36.2 C), temperature source Tympanic, resp. rate 17, weight 149 lb 4.8 oz (67.7 kg), SpO2 100 %.  LABORATORY DATA: Lab Results  Component Value Date   WBC 6.3 07/22/2021   HGB 12.5 (L) 07/22/2021   HCT 35.9 (L) 07/22/2021   MCV 93.5 07/22/2021   PLT 156 07/22/2021      Chemistry      Component Value Date/Time   NA 136 05/13/2021 0913   NA 135 (L) 07/14/2016 0935   K 4.1 05/13/2021 0913   K 4.4 07/14/2016 0935   CL 100 05/13/2021 0913   CL 106 07/19/2012 1028   CO2 23 05/13/2021 0913   CO2 24 07/14/2016 0935   BUN 10 05/13/2021 0913   BUN 10.4 07/14/2016 0935   CREATININE 0.83 05/13/2021 0913   CREATININE 0.95 07/22/2020 1307   CREATININE 0.8 07/14/2016 0935      Component Value Date/Time   CALCIUM 9.8 05/13/2021 0913   CALCIUM 9.0 07/14/2016 0935   ALKPHOS 41 (L) 05/13/2021 0913   ALKPHOS 60 07/14/2016 0935   AST 23 05/13/2021 0913   AST 22 07/22/2020 1307   AST 31 07/14/2016 0935   ALT 14 05/13/2021 0913   ALT 17 07/22/2020 1307   ALT 33 07/14/2016 0935   BILITOT 1.2 05/13/2021 0913   BILITOT 1.1 07/22/2020 1307   BILITOT 1.67 (H) 07/14/2016 0935       RADIOGRAPHIC STUDIES: No results found.  ASSESSMENT AND PLAN:  This is a very pleasant 81  years old white male with idiopathic thrombocytopenic purpura. The patient has been on observation for several years. The patient is feeling fine today with no concerning complaints. Repeat CBC today showed further improvement of his platelets count up to 156,000.  He has mild anemia with hemoglobin of 12.5. I recommended for the patient to continue on observation in addition to multivitamin supplements. I will see him back for follow-up visit in 1 year for evaluation and repeat blood work. He was advised to call immediately if he has any other concerning symptoms in the interval. All questions were answered. The patient knows to call the clinic with any problems, questions or concerns. We can certainly see the patient much sooner if necessary.  Disclaimer: This note was dictated with voice recognition software. Similar sounding words can  inadvertently be transcribed and may not be corrected upon review.

## 2021-09-16 ENCOUNTER — Other Ambulatory Visit: Payer: Self-pay | Admitting: Legal Medicine

## 2021-09-17 ENCOUNTER — Ambulatory Visit (INDEPENDENT_AMBULATORY_CARE_PROVIDER_SITE_OTHER): Payer: Medicare HMO | Admitting: Legal Medicine

## 2021-09-17 ENCOUNTER — Encounter: Payer: Self-pay | Admitting: Legal Medicine

## 2021-09-17 ENCOUNTER — Other Ambulatory Visit: Payer: Self-pay

## 2021-09-17 VITALS — BP 136/70 | HR 76 | Temp 96.3°F | Ht 70.0 in | Wt 148.0 lb

## 2021-09-17 DIAGNOSIS — E1151 Type 2 diabetes mellitus with diabetic peripheral angiopathy without gangrene: Secondary | ICD-10-CM

## 2021-09-17 DIAGNOSIS — I25118 Atherosclerotic heart disease of native coronary artery with other forms of angina pectoris: Secondary | ICD-10-CM

## 2021-09-17 DIAGNOSIS — Z789 Other specified health status: Secondary | ICD-10-CM | POA: Diagnosis not present

## 2021-09-17 DIAGNOSIS — I2510 Atherosclerotic heart disease of native coronary artery without angina pectoris: Secondary | ICD-10-CM | POA: Diagnosis not present

## 2021-09-17 DIAGNOSIS — J449 Chronic obstructive pulmonary disease, unspecified: Secondary | ICD-10-CM | POA: Diagnosis not present

## 2021-09-17 DIAGNOSIS — Z6821 Body mass index (BMI) 21.0-21.9, adult: Secondary | ICD-10-CM

## 2021-09-17 DIAGNOSIS — I5032 Chronic diastolic (congestive) heart failure: Secondary | ICD-10-CM | POA: Diagnosis not present

## 2021-09-17 DIAGNOSIS — E1142 Type 2 diabetes mellitus with diabetic polyneuropathy: Secondary | ICD-10-CM | POA: Diagnosis not present

## 2021-09-17 DIAGNOSIS — E782 Mixed hyperlipidemia: Secondary | ICD-10-CM

## 2021-09-17 DIAGNOSIS — I10 Essential (primary) hypertension: Secondary | ICD-10-CM | POA: Diagnosis not present

## 2021-09-17 DIAGNOSIS — N401 Enlarged prostate with lower urinary tract symptoms: Secondary | ICD-10-CM

## 2021-09-17 NOTE — Progress Notes (Signed)
Subjective:  Patient ID: [name], male    DOB: 09-25-1940  Age: 81 y.o. MRN: [id number]  Chief Complaint  Patient presents with   Diabetes   Hyperlipidemia   Hypertension    HPI: chronic visit  Patient presents for follow up of hypertension.  Patient tolerating metoprolol, lisinopril well with side effects.  Patient was diagnosed with hypertension 2010 so has been treated for hypertension for 12 years.Patient is working on maintaining diet and exercise regimen and follows up as directed. Complication include cad.   Patient presents with hyperlipidemia.  Compliance with treatment has been good; patient takes medicines as directed, maintains low cholesterol diet, follows up as directed, and maintains exercise regimen.  Patient is using zetia without problems.   CORONARY ARTERY DISEASE  Patient presents in follow up of CAD. Patient was diagnosed in 2003. The patient has no associated CHF. The patient is currently taking a beta blocker, statin, and aspirin. CAD was diagnosed 2003 years ago.  Patient is having no angina. Patient has used no NTG.  Patient is followed by cardiology.  Patient had stents.. Last angiography was 2007, last echocardiogram 2022.   Patient presents with HFpEF  that is stable. Diagnosis made 2003.  The course of the disease is stable.  Current medicines include IMDUR and metoprool. Patient follows a low cholesterol diet and maintains a weight diary.  Patient is on low salt, low cholesterol diet and avoids alcohol.  Patient denies adverse effects of medicines. Patient is monitoring weight and has no changes of weight.  Patient is having no pedal edema, no PND and no PND.  Patient is continuing to see cardiology.   Patient present with type 2 diabetes.  Specifically, this is type 2, noninsulin requiring diabetes, complicated by angiopathy.  Compliance with treatment has been good; patient take medicines as directed, maintains diet and exercise regimen, follows up as  directed, and is keeping glucose diary.  Date of  diagnosis 2010.  Depression screen has been performed.Tobacco screen nonsmoker. Current medicines for diabetes metoprolol.  Patient is on lisinopril for renal protection and zetia for cholesterol control.  Patient performs foot exams daily and last ophthalmologic exam was performed.   ITB stable   Current Outpatient Medications on File Prior to Visit  Medication Sig Dispense Refill   cholecalciferol (VITAMIN D3) 25 MCG (1000 UT) tablet Take 5,000 Units by mouth daily.     clopidogrel (PLAVIX) 75 MG tablet TAKE 1 TABLET BY MOUTH DAILY 90 tablet 1   ezetimibe (ZETIA) 10 MG tablet Take 1 tablet (10 mg total) by mouth daily. 90 tablet 3   isosorbide mononitrate (IMDUR) 30 MG 24 hr tablet TAKE 1 TABLET BY MOUTH DAILY 90 tablet 1   Lancets MISC 1 each by Does not apply route daily as needed. Please provide lancets that fit patients lancet device 100 each 4   lisinopril (ZESTRIL) 5 MG tablet TAKE 1 TABLET BY MOUTH DAILY 90 tablet 2   metFORMIN (GLUCOPHAGE) 500 MG tablet TAKE 1 TABLET BY MOUTH 3 TIMES DAILY 270 tablet 2   metoprolol tartrate (LOPRESSOR) 25 MG tablet TAKE 1/2 TABLET BY MOUTH DAILY 45 tablet 3   Multiple Vitamin (MULTIVITAMIN) tablet Take 1 tablet by mouth daily.     nitroGLYCERIN (NITROSTAT) 0.4 MG SL tablet PLACE 1 TABLET UNDER THE TONGUE EVERY 5 MINUTES AS NEEDED (Patient not taking: Reported on 07/22/2021) 25 tablet 6   ONETOUCH ULTRA test strip AS DIRECTED 100 each 4   No current facility-administered  medications on file prior to visit.   Past Medical History:  Diagnosis Date   Atherosclerotic heart disease of native coronary artery without angina pectoris 02/12/2010   Qualifier: Diagnosis of  By: Marland Mcalpine     Benign prostatic hyperplasia with lower urinary tract symptoms 12/04/2019   CAD (coronary artery disease)    Hx of    Cellulitis 03/19/2012   Chronic obstructive pulmonary disease ([place]) 12/04/2019   Diverticulosis of  colon (without mention of hemorrhage) 2011   Colonoscopy    Hiatal hernia 2011   EGD   Hypertension    Hypertensive heart disease without heart failure 12/04/2019   ITP (idiopathic thrombocytopenic purpura)    He has been diagnosed with   Pancytopenia    Thrombocytopenia, unspecified ([place]) 01/24/2013   TRANSAMINASES, SERUM, ELEVATED 02/12/2010   Qualifier: Diagnosis of  By: Marland Mcalpine     Type 2 diabetes mellitus with diabetic peripheral angiopathy without gangrene ([place]) 12/04/2019   Past Surgical History:  Procedure Laterality Date   CARDIAC CATHETERIZATION     His last heart catheterization in May of 2009 reveals a patent LIMA   CORONARY ANGIOPLASTY     Successful percutaneous transluminal coronary angioplasty of the left circumflex obuse marginal vessel   CORONARY ANGIOPLASTY WITH STENT PLACEMENT     CORONARY ARTERY BYPASS GRAFT     x4 with a left internal mammary artery anastomosis to the left anterior descending coronary artery    CORONARY STENT PLACEMENT     successful percutaneous transluminal coronary angioplasty and stenting of the left main coronary artery   HERNIA REPAIR     SAPHENOUS VEIN GRAFT RESECTION     graft to the first obtuse marginal, a saphenous vein graft to the first diagonal coronary artery  and a saphenous vein graft to the distal right coronary artery   US ECHOCARDIOGRAPHY  02-08-2007   Est. EF 40-45%    Family History  Problem Relation Age of Onset   Cerebrovascular Accident Mother    Colon cancer Neg Hx    Social History   Socioeconomic History   Marital status: Married    Spouse name: Not on file   Number of children: 3   Years of education: Not on file   Highest education level: Not on file  Occupational History   Occupation: Retired   Tobacco Use   Smoking status: Never   Smokeless tobacco: Never  Vaping Use   Vaping Use: Never used  Substance and Sexual Activity   Alcohol use: No   Drug use: No   Sexual activity: Not Currently   Other Topics Concern   Not on file  Social History Narrative   Daily caffeine use    Social Determinants of Health   Financial Resource Strain: Not on file  Food Insecurity: Not on file  Transportation Needs: Not on file  Physical Activity: Not on file  Stress: Not on file  Social Connections: Not on file    Review of Systems  Constitutional:  Negative for chills, diaphoresis, fatigue and fever.  HENT:  Negative for congestion, ear pain and sore throat.   Eyes:  Positive for visual disturbance.  Respiratory:  Negative for cough and shortness of breath.   Cardiovascular:  Negative for chest pain and leg swelling.  Gastrointestinal:  Negative for abdominal pain, constipation, diarrhea, nausea and vomiting.  Endocrine: Negative for polyuria.  Genitourinary:  Negative for dysuria and urgency.  Musculoskeletal:  Negative for arthralgias and myalgias.  Neurological:  Negative for dizziness and headaches.  Hematological:  Does not bruise/bleed easily.  Psychiatric/Behavioral:  Negative for dysphoric mood.     Objective:  BP 136/70    Pulse 76    Temp (!) 96.3 F (35.7 C)    Ht '5\' 10"'$  (1.778 m)    Wt 148 lb (67.1 kg)    SpO2 99%    BMI 21.24 kg/m   BP/Weight 09/17/2021 07/22/2021 95/07/8839  Systolic BP 660 630 160  Diastolic BP 70 75 80  Wt. (Lbs) 148 149.3 151  BMI 21.24 21.42 21.67    Physical Exam Vitals reviewed.  Constitutional:      General: He is not in acute distress.    Appearance: Normal appearance.  HENT:     Head: Normocephalic.     Right Ear: Tympanic membrane normal.     Left Ear: Tympanic membrane normal.     Nose: Nose normal.     Mouth/Throat:     Mouth: Mucous membranes are moist.     Pharynx: Oropharynx is clear.  Eyes:     Conjunctiva/sclera: Conjunctivae normal.     Pupils: Pupils are equal, round, and reactive to light.  Cardiovascular:     Rate and Rhythm: Normal rate and regular rhythm.     Pulses: Normal pulses.     Heart sounds: Normal  heart sounds. No murmur heard.   No gallop.  Pulmonary:     Effort: Pulmonary effort is normal. No respiratory distress.     Breath sounds: No wheezing.  Abdominal:     General: Abdomen is flat. Bowel sounds are normal. There is no distension.     Tenderness: There is no abdominal tenderness.  Musculoskeletal:     Cervical back: Normal range of motion.  Neurological:     Mental Status: He is alert.    Diabetic Foot Exam - Simple   Simple Foot Form Diabetic Foot exam was performed with the following findings: Yes   Visual Inspection See comments: Yes Sensation Testing See comments: Yes Pulse Check Posterior Tibialis and Dorsalis pulse intact bilaterally: Yes Comments Large bunion, decreased sensation      Lab Results  Component Value Date   WBC 6.3 07/22/2021   HGB 12.5 (L) 07/22/2021   HCT 35.9 (L) 07/22/2021   PLT 156 07/22/2021   GLUCOSE 184 (H) 07/22/2021   CHOL 183 05/13/2021   TRIG 100 05/13/2021   HDL 61 05/13/2021   LDLCALC 104 (H) 05/13/2021   ALT 11 07/22/2021   AST 18 07/22/2021   NA 136 07/22/2021   K 4.5 07/22/2021   CL 101 07/22/2021   CREATININE 0.78 07/22/2021   BUN 13 07/22/2021   CO2 29 07/22/2021   TSH 2.370 01/07/2021   INR 1.3 (H) 03/24/2011   HGBA1C 6.0 (H) 05/13/2021   MICROALBUR 10 05/13/2021      Assessment & Plan:   Problem List Items Addressed This Visit       Cardiovascular and Mediastinum   Coronary artery disease of native artery of native heart with stable angina pectoris ([place]) - Primary Patient does have artery disease with stent placement.  He has not had any chest pain any nitroglycerin.  No Medical follow-up appointment with the cardiologist.  We will set up appointment.    Benign essential HTN   Relevant Orders   Comprehensive metabolic panel   CBC with Differential/Platelet An individual hypertension care plan was established and reinforced today.  The patient's status was assessed using clinical findings on exam  and labs or diagnostic tests. The patient's success at meeting treatment goals on disease specific evidence-based guidelines and found to be fair controlled. SELF MANAGEMENT: The patient and I together assessed ways to personally work towards obtaining the recommended goals. RECOMMENDATIONS: avoid decongestants found in common cold remedies, decrease consumption of alcohol, perform routine monitoring of BP with home BP cuff, exercise, reduction of dietary salt, take medicines as prescribed, try not to miss doses and quit smoking.  Regular exercise and maintaining a healthy weight is needed.  Stress reduction may help. A CLINICAL SUMMARY including written plan identify barriers to care unique to individual due to social or financial issues.  We attempt to mutually creat solutions for individual and family understanding.     Congestive heart failure ([place]) Patient has a history of congestive heart failure but no symptoms for 10 years he has not had any edema orthopnea or PND.    Type 2 diabetes mellitus with diabetic peripheral angiopathy without gangrene ([place])   Relevant Orders   Hemoglobin A1c   Microalbumin / creatinine urine ratio An individual care plan for diabetes was established and reinforced today.  The patient's status was assessed using clinical findings on exam, labs and diagnostic testing. Patient success at meeting goals based on disease specific evidence-based guidelines and found to be good controlled. Medications were assessed and patient's understanding of the medical issues , including barriers were assessed. Recommend adherence to a diabetic diet, a graduated exercise program, HgbA1c level is checked quarterly, and urine microalbumin performed yearly .  Annual mono-filament sensation testing performed. Lower blood pressure and control hyperlipidemia is important. Get annual eye exams and annual flu shots and smoking cessation discussed.  Self management goals were discussed.       Respiratory   Chronic obstructive pulmonary disease ([place]) An individualize plan was formulated for care of COPD.  Treatment is evidence based.  She will continue on inhalers, avoid smoking and smoke.  Regular exercise with help with dyspnea. Routine follow ups and medication compliance is needed.      Endocrine   Diabetic polyneuropathy [facility]) An individual care plan for diabetes was established and reinforced today.  The patient's status was assessed using clinical findings on exam, labs and diagnostic testing. Patient success at meeting goals based on disease specific evidence-based guidelines and found to be good  controlled. Medications were assessed and patient's understanding of the medical issues , including barriers were assessed. Recommend adherence to a diabetic diet, a graduated exercise program, HgbA1c level is checked quarterly, and urine microalbumin performed yearly .  Annual mono-filament sensation testing performed. Lower blood pressure and control hyperlipidemia is important. Get annual eye exams and annual flu shots and smoking cessation discussed.  Self management goals were discussed.      Genitourinary   Benign prostatic hyperplasia with lower urinary tract symptoms   Relevant Orders   PSA AN INDIVIDUAL CARE PLAN for BPH was established and reinforced today.  The patient's status was assessed using clinical findings on exam, labs, and other diagnostic testing. Patient's success at meeting treatment goals based on disease specific evidence-bassed guidelines and found to be in good control. RECOMMENDATIONS include maintaining present medicines and treatment.      Other   Mixed hyperlipidemia   Relevant Orders   Lipid panel AN INDIVIDUAL CARE PLAN for hyperlipidemia/ cholesterol was established and reinforced today.  The patient's status was assessed using clinical findings on exam, lab and other diagnostic tests. The patient's disease status was assessed based  on  evidence-based guidelines and found to be fair controlled. MEDICATIONS were reviewed. SELF MANAGEMENT GOALS have been discussed and patient's success at attaining the goal of low cholesterol was assessed. RECOMMENDATION given include regular exercise 3 days a week and low cholesterol/low fat diet. CLINICAL SUMMARY including written plan to identify barriers unique to the patient due to social or economic  reasons was discussed.    BMI 21.0-21.9, adult Patient has low BMI but is stable on this he has some evidence for malnutrition and I recommended he takes protein calorie supplement every day.    Statin intolerance Patient is statin intolerant so we will continue using Zetia he may require further treatment for cholesterol.  A total of 30 minutes were spent face-to-face with the patient during this encounter and over half of that time was spent on counseling and coordination of care.      Orders Placed This Encounter  Procedures   Comprehensive metabolic panel   Lipid panel   Hemoglobin A1c   Microalbumin / creatinine urine ratio   CBC with Differential/Platelet   PSA     Follow-up: Return in about 4 months (around 01/17/2022) for fasting.  An After Visit Summary was printed and given to the patient.  Reinaldo Meeker, MD [facility] [phone]

## 2021-09-18 LAB — LIPID PANEL
Chol/HDL Ratio: 2.4 ratio (ref 0.0–5.0)
Cholesterol, Total: 157 mg/dL (ref 100–199)
HDL: 66 mg/dL (ref >39)
LDL Chol Calc (NIH): 77 mg/dL (ref 0–99)
Triglycerides: 71 mg/dL (ref 0–149)
VLDL Cholesterol Cal: 14 mg/dL (ref 5–40)

## 2021-09-18 LAB — COMPREHENSIVE METABOLIC PANEL
ALT: 12 IU/L (ref 0–44)
AST: 20 IU/L (ref 0–40)
Albumin/Globulin Ratio: 1.7 (ref 1.2–2.2)
Albumin: 4.2 g/dL (ref 3.6–4.6)
Alkaline Phosphatase: 41 IU/L — ABNORMAL LOW (ref 44–121)
BUN/Creatinine Ratio: 13 (ref 10–24)
BUN: 10 mg/dL (ref 8–27)
Bilirubin Total: 0.7 mg/dL (ref 0.0–1.2)
CO2: 26 mmol/L (ref 20–29)
Calcium: 9.7 mg/dL (ref 8.6–10.2)
Chloride: 100 mmol/L (ref 96–106)
Creatinine, Ser: 0.75 mg/dL — ABNORMAL LOW (ref 0.76–1.27)
Globulin, Total: 2.5 g/dL (ref 1.5–4.5)
Glucose: 135 mg/dL — ABNORMAL HIGH (ref 70–99)
Potassium: 4.8 mmol/L (ref 3.5–5.2)
Sodium: 136 mmol/L (ref 134–144)
Total Protein: 6.7 g/dL (ref 6.0–8.5)
eGFR: 90 mL/min/{1.73_m2} (ref >59)

## 2021-09-18 LAB — HEMOGLOBIN A1C
Est. average glucose Bld gHb Est-mCnc: 128 mg/dL
Hgb A1c MFr Bld: 6.1 % — ABNORMAL HIGH (ref 4.8–5.6)

## 2021-09-18 LAB — CBC WITH DIFFERENTIAL/PLATELET
Basophils Absolute: 0 10*3/uL (ref 0.0–0.2)
Basos: 1 %
EOS (ABSOLUTE): 0.1 10*3/uL (ref 0.0–0.4)
Eos: 2 %
Hematocrit: 40.7 % (ref 37.5–51.0)
Hemoglobin: 14 g/dL (ref 13.0–17.7)
Immature Grans (Abs): 0 10*3/uL (ref 0.0–0.1)
Immature Granulocytes: 0 %
Lymphocytes Absolute: 1 10*3/uL (ref 0.7–3.1)
Lymphs: 18 %
MCH: 32 pg (ref 26.6–33.0)
MCHC: 34.4 g/dL (ref 31.5–35.7)
MCV: 93 fL (ref 79–97)
Monocytes Absolute: 0.4 10*3/uL (ref 0.1–0.9)
Monocytes: 8 %
Neutrophils Absolute: 3.8 10*3/uL (ref 1.4–7.0)
Neutrophils: 71 %
Platelets: 140 10*3/uL — ABNORMAL LOW (ref 150–450)
RBC: 4.37 x10E6/uL (ref 4.14–5.80)
RDW: 12.3 % (ref 11.6–15.4)
WBC: 5.4 10*3/uL (ref 3.4–10.8)

## 2021-09-18 LAB — MICROALBUMIN / CREATININE URINE RATIO
Creatinine, Urine: 68.3 mg/dL
Microalb/Creat Ratio: 8 mg/g creat (ref 0–29)
Microalbumin, Urine: 5.6 ug/mL

## 2021-09-18 LAB — PSA: Prostate Specific Ag, Serum: 2.7 ng/mL (ref 0.0–4.0)

## 2021-09-18 LAB — CARDIOVASCULAR RISK ASSESSMENT

## 2021-09-18 NOTE — Progress Notes (Signed)
Glucose 135, kidney tests normal, liver tests normal, A1c 6.1, CBC platelets 140,000 stable, LDL cholesterol 104 high, microalbuminuria normal ?lp

## 2021-10-08 DIAGNOSIS — M533 Sacrococcygeal disorders, not elsewhere classified: Secondary | ICD-10-CM | POA: Diagnosis not present

## 2021-10-08 DIAGNOSIS — W19XXXA Unspecified fall, initial encounter: Secondary | ICD-10-CM | POA: Insufficient documentation

## 2021-10-08 DIAGNOSIS — M545 Low back pain, unspecified: Secondary | ICD-10-CM | POA: Diagnosis not present

## 2021-10-08 DIAGNOSIS — Y92009 Unspecified place in unspecified non-institutional (private) residence as the place of occurrence of the external cause: Secondary | ICD-10-CM | POA: Insufficient documentation

## 2021-10-13 ENCOUNTER — Other Ambulatory Visit: Payer: Self-pay | Admitting: Cardiovascular Disease

## 2021-10-13 ENCOUNTER — Other Ambulatory Visit: Payer: Self-pay | Admitting: Legal Medicine

## 2021-10-21 ENCOUNTER — Ambulatory Visit (INDEPENDENT_AMBULATORY_CARE_PROVIDER_SITE_OTHER): Payer: Medicare HMO | Admitting: Sports Medicine

## 2021-10-21 ENCOUNTER — Encounter: Payer: Self-pay | Admitting: Sports Medicine

## 2021-10-21 DIAGNOSIS — B351 Tinea unguium: Secondary | ICD-10-CM

## 2021-10-21 DIAGNOSIS — E1142 Type 2 diabetes mellitus with diabetic polyneuropathy: Secondary | ICD-10-CM

## 2021-10-21 DIAGNOSIS — L84 Corns and callosities: Secondary | ICD-10-CM

## 2021-10-21 DIAGNOSIS — M79609 Pain in unspecified limb: Secondary | ICD-10-CM

## 2021-10-21 DIAGNOSIS — E119 Type 2 diabetes mellitus without complications: Secondary | ICD-10-CM | POA: Insufficient documentation

## 2021-10-21 DIAGNOSIS — I519 Heart disease, unspecified: Secondary | ICD-10-CM | POA: Insufficient documentation

## 2021-10-21 NOTE — Progress Notes (Signed)
Subjective: ?[name] is a 81 y.o. male patient with history of diabetes who presents to office today complaining of long,mildly painful nails and callus while ambulating in shoes; unable to trim. Patient states that he did have a fall back in March and went to urgent care and had x-rays and was checked out while he was at [facility] but did not have any major issues since this fall. ? ?Fasting blood sugar this morning 148 ?Last PCP Lillard Anes, MD  visit March 2023 ?Last A1c 6.1 ? ?Patient Active Problem List  ? Diagnosis Date Noted  ? Diabetes ([place]) 10/21/2021  ? Heart disease 10/21/2021  ? Fall at home 10/08/2021  ? Generalized weakness 11/14/2020  ? Statin intolerance 04/15/2020  ? Diabetic polyneuropathy ([place]) 12/05/2019  ? BMI 21.0-21.9, adult 12/05/2019  ? Benign prostatic hyperplasia with lower urinary tract symptoms 12/04/2019  ? Type 2 diabetes mellitus with diabetic peripheral angiopathy without gangrene ([place]) 12/04/2019  ? Chronic obstructive pulmonary disease ([place]) 12/04/2019  ? Mixed hyperlipidemia 01/29/2017  ? Idiopathic thrombocytopenic purpura ([place]) 01/26/2012  ? Congestive heart failure ([place]) 09/29/2011  ? Benign essential HTN 07/08/2010  ? DIVERTICULOSIS-COLON 05/12/2010  ? Diaphragmatic hernia 03/25/2010  ? Coronary artery disease of native artery of native heart with stable angina pectoris ([place])   ? ?Current Outpatient Medications on File Prior to Visit  ?Medication Sig Dispense Refill  ? cholecalciferol (VITAMIN D3) 25 MCG (1000 UT) tablet Take 5,000 Units by mouth daily.    ? clopidogrel (PLAVIX) 75 MG tablet TAKE 1 TABLET BY MOUTH DAILY 90 tablet 1  ? ezetimibe (ZETIA) 10 MG tablet TAKE 1 TABLET BY MOUTH DAILY 90 tablet 0  ? isosorbide mononitrate (IMDUR) 30 MG 24 hr tablet TAKE 1 TABLET BY MOUTH DAILY 90 tablet 1  ? Lancets MISC 1 each by Does not apply route daily as needed. Please provide lancets that fit patients lancet device 100 each 4  ? lisinopril  (ZESTRIL) 5 MG tablet TAKE 1 TABLET BY MOUTH DAILY 90 tablet 2  ? metFORMIN (GLUCOPHAGE) 500 MG tablet TAKE 1 TABLET BY MOUTH 3 TIMES DAILY 270 tablet 2  ? metoprolol tartrate (LOPRESSOR) 25 MG tablet TAKE 1/2 TABLET BY MOUTH DAILY 45 tablet 3  ? Multiple Vitamin (MULTIVITAMIN) tablet Take 1 tablet by mouth daily.    ? ONETOUCH ULTRA test strip AS DIRECTED 100 each 4  ? ?No current facility-administered medications on file prior to visit.  ? ?Allergies  ?Allergen Reactions  ? Lipitor [Atorvastatin Calcium] Other (See Comments)  ?  Causes memory loss  ? Crestor [Rosuvastatin Calcium] Other (See Comments)  ?  Causes Muscle Pain  ? Zocor [Simvastatin] Other (See Comments)  ?  Causes muscle pain  ? ? ?Recent Results (from the past 2160 hour(s))  ?Comprehensive metabolic panel     Status: Abnormal  ? Collection Time: 09/17/21  8:36 AM  ?Result Value Ref Range  ? Glucose 135 (H) 70 - 99 mg/dL  ? BUN 10 8 - 27 mg/dL  ? Creatinine, Ser 0.75 (L) 0.76 - 1.27 mg/dL  ? eGFR 90 >59 mL/min/1.73  ? BUN/Creatinine Ratio 13 10 - 24  ? Sodium 136 134 - 144 mmol/L  ? Potassium 4.8 3.5 - 5.2 mmol/L  ? Chloride 100 96 - 106 mmol/L  ? CO2 26 20 - 29 mmol/L  ? Calcium 9.7 8.6 - 10.2 mg/dL  ? Total Protein 6.7 6.0 - 8.5 g/dL  ? Albumin 4.2 3.6 - 4.6 g/dL  ?  Globulin, Total 2.5 1.5 - 4.5 g/dL  ? Albumin/Globulin Ratio 1.7 1.2 - 2.2  ? Bilirubin Total 0.7 0.0 - 1.2 mg/dL  ? Alkaline Phosphatase 41 (L) 44 - 121 IU/L  ? AST 20 0 - 40 IU/L  ? ALT 12 0 - 44 IU/L  ?Lipid panel     Status: None  ? Collection Time: 09/17/21  8:36 AM  ?Result Value Ref Range  ? Cholesterol, Total 157 100 - 199 mg/dL  ? Triglycerides 71 0 - 149 mg/dL  ? HDL 66 >39 mg/dL  ? VLDL Cholesterol Cal 14 5 - 40 mg/dL  ? LDL Chol Calc (NIH) 77 0 - 99 mg/dL  ? Chol/HDL Ratio 2.4 0.0 - 5.0 ratio  ?  Comment:                                   T. Chol/HDL Ratio ?                                            Men  Women ?                              1/2 Avg.Risk  3.4    3.3 ?                                   Avg.Risk  5.0    4.4 ?                               2X Avg.Risk  9.6    7.1 ?                               3X Avg.Risk 23.4   11.0 ?  ?Hemoglobin A1c     Status: Abnormal  ? Collection Time: 09/17/21  8:36 AM  ?Result Value Ref Range  ? Hgb A1c MFr Bld 6.1 (H) 4.8 - 5.6 %  ?  Comment:          Prediabetes: 5.7 - 6.4 ?         Diabetes: >6.4 ?         Glycemic control for adults with diabetes: <7.0 ?  ? Est. average glucose Bld gHb Est-mCnc 128 mg/dL  ?Microalbumin / creatinine urine ratio     Status: None  ? Collection Time: 09/17/21  8:36 AM  ?Result Value Ref Range  ? Creatinine, Urine 68.3 Not Estab. mg/dL  ? Microalbumin, Urine 5.6 Not Estab. ug/mL  ? Microalb/Creat Ratio 8 0 - 29 mg/g creat  ?  Comment:                        Normal:                0 -  29 ?                       Moderately increased: 30 - 300 ?  Severely increased:       >300 ?  ?CBC with Differential/Platelet     Status: Abnormal  ? Collection Time: 09/17/21  8:36 AM  ?Result Value Ref Range  ? WBC 5.4 3.4 - 10.8 x10E3/uL  ? RBC 4.37 4.14 - 5.80 x10E6/uL  ? Hemoglobin 14.0 13.0 - 17.7 g/dL  ? Hematocrit 40.7 37.5 - 51.0 %  ? MCV 93 79 - 97 fL  ? MCH 32.0 26.6 - 33.0 pg  ? MCHC 34.4 31.5 - 35.7 g/dL  ? RDW 12.3 11.6 - 15.4 %  ? Platelets 140 (L) 150 - 450 x10E3/uL  ? Neutrophils 71 Not Estab. %  ? Lymphs 18 Not Estab. %  ? Monocytes 8 Not Estab. %  ? Eos 2 Not Estab. %  ? Basos 1 Not Estab. %  ? Neutrophils Absolute 3.8 1.4 - 7.0 x10E3/uL  ? Lymphocytes Absolute 1.0 0.7 - 3.1 x10E3/uL  ? Monocytes Absolute 0.4 0.1 - 0.9 x10E3/uL  ? EOS (ABSOLUTE) 0.1 0.0 - 0.4 x10E3/uL  ? Basophils Absolute 0.0 0.0 - 0.2 x10E3/uL  ? Immature Granulocytes 0 Not Estab. %  ? Immature Grans (Abs) 0.0 0.0 - 0.1 x10E3/uL  ?PSA     Status: None  ? Collection Time: 09/17/21  8:36 AM  ?Result Value Ref Range  ? Prostate Specific Ag, Serum 2.7 0.0 - 4.0 ng/mL  ?  Comment: Roche ECLIA methodology. ?According to the  American Urological Association, Serum PSA should ?decrease and remain at undetectable levels after radical ?prostatectomy. The AUA defines biochemical recurrence as an initial ?PSA value 0.2 ng/mL or greater followed by a subsequent confirmatory ?PSA value 0.2 ng/mL or greater. ?Values obtained with different assay methods or kits cannot be used ?interchangeably. Results cannot be interpreted as absolute evidence ?of the presence or absence of malignant disease. ?  ?Cardiovascular Risk Assessment     Status: None  ? Collection Time: 09/17/21  8:36 AM  ?Result Value Ref Range  ? Interpretation Note   ?  Comment: Supplemental report is available.  ? ? ?Objective: ?General: Patient is awake, alert, and oriented x 3 and in no acute distress. ? ?Integument: Skin is warm, dry and supple bilateral. Nails are tender, long, thickened and dystrophic with subungual debris, consistent with onychomycosis, 1-5 bilateral.  Callus present bilateral fifth metatarsal heads and plantar right forefoot sub met 3. Remaining integument unremarkable. ? ?Vasculature:  Dorsalis Pedis pulse 1/4 bilateral. Posterior Tibial pulse  0/4 bilateral.  ?Capillary fill time <3 sec 1-5 bilateral.  No hair growth to the level of the digits. ?Temperature gradient within normal limits.  Brawny hyperpigmentation and mild varicosities present bilateral. No edema present bilateral.  ? ?Neurology: The patient has intact sensation measured with a 5.07/10g Semmes Weinstein Monofilament at all pedal sites bilateral . Vibratory sensation diminished bilateral with tuning fork. No Babinski sign present bilateral.  ? ?Musculoskeletal: Asymptomatic hammertoe bunion and pes planus deformity with fat pad atrophy pedal deformities noted bilateral. Muscular strength 5/5 in all lower extremity muscular groups bilateral without pain on range of motion .  Mild tenderness to callused areas.  No tenderness with calf compression bilateral. ? ?Assessment and Plan: ?Problem  List Items Addressed This Visit   ? ?  ? Endocrine  ? Diabetic polyneuropathy ([place])  ? ?Other Visit Diagnoses   ? ? Pain due to onychomycosis of nail    -  Primary  ? Callus      ? ?  ? ? ?-Examined patient. ?-Sharma Covert

## 2021-10-28 ENCOUNTER — Telehealth: Payer: Self-pay

## 2021-10-28 NOTE — Telephone Encounter (Signed)
Medication change 

## 2021-10-28 NOTE — Telephone Encounter (Signed)
Patient called and wanted to know if he can go back to taking metformin 4 times daily, his sugars has been running high and he was on 4 times a day and he went down to 3. Please advise ?

## 2021-10-28 NOTE — Telephone Encounter (Signed)
Can restart metformin ?lp ?

## 2021-10-28 NOTE — Telephone Encounter (Signed)
Patient made aware, verbalized understanding

## 2021-11-11 ENCOUNTER — Encounter: Payer: Self-pay | Admitting: Cardiovascular Disease

## 2021-11-11 ENCOUNTER — Ambulatory Visit: Payer: Medicare HMO | Admitting: Cardiovascular Disease

## 2021-11-11 VITALS — BP 120/62 | HR 90 | Ht 70.0 in | Wt 142.0 lb

## 2021-11-11 DIAGNOSIS — I5032 Chronic diastolic (congestive) heart failure: Secondary | ICD-10-CM | POA: Diagnosis not present

## 2021-11-11 DIAGNOSIS — E782 Mixed hyperlipidemia: Secondary | ICD-10-CM | POA: Diagnosis not present

## 2021-11-11 DIAGNOSIS — I25118 Atherosclerotic heart disease of native coronary artery with other forms of angina pectoris: Secondary | ICD-10-CM

## 2021-11-11 NOTE — Patient Instructions (Signed)
Medication Instructions:  ?Your physician recommends that you continue on your current medications as directed. Please refer to the Current Medication list given to you today. ? ?*If you need a refill on your cardiac medications before your next appointment, please call your pharmacy* ? ? ?Lab Work: ?NONE ?If you have labs (blood work) drawn today and your tests are completely normal, you will receive your results only by: ?MyChart Message (if you have MyChart) OR ?A paper copy in the mail ?If you have any lab test that is abnormal or we need to change your treatment, we will call you to review the results. ? ? ?Testing/Procedures: ?NONE ? ? ?Follow-Up: ?At [facility], you and your health needs are our priority.  As part of our continuing mission to provide you with exceptional heart care, we have created designated Provider Care Teams.  These Care Teams include your primary Cardiologist (physician) and Advanced Practice Providers (APPs -  Physician Assistants and Nurse Practitioners) who all work together to provide you with the care you need, when you need it. ? ?Your next appointment:   ?1 year(s) ? ?The format for your next appointment:   ?In Person ? ?Provider:   ?Robbie Lis, PA-C, Christen Bame, NP, or Richardson Dopp, PA-C, or     Mertie Moores, MD ? ?Important Information About Sugar ? ? ? ? ?  ?

## 2021-11-11 NOTE — Progress Notes (Signed)
? ?Cardiology Office Note ? ?Date:  11/11/2021  ? ?ID:  [name], DOB 06-27-1941, MRN [id number] ? ?PCP:  Lillard Anes, MD  ?Cardiologist:   Mertie Moores, MD  ? ?Chief Complaint  ?Patient presents with  ? Coronary Artery Disease  ?   ?  ? ?1. Coronary artery disease-status post MI and eventually s/p  CABG  ( 2003) and also stenting ( 2007) ?2. Hyperlipidemia-he has been generally intolerant of statins-Crestor causes muscle pain, Lipitor causes memory loss, Zocor causes muscle pain. ?2. Diabetes mellitus ?3.  Chronic diastolic CHF:  ? ?[name] is an 81 year old gentleman with a history of coronary artery disease. He status post coronary artery bypass grafting. He is also status post PTCA and stenting. He also has a  history of hyperlipidemia and diabetes mellitus.  ? ?He is exercising regularly .  He does lots of yard work.  He enjoys going to his beach house. ? ?January 05, 2013: ? ?[name] is doing well.  His garden is doing well.  Occasional CP and dyspnea.  Pains last only for a few seconds.   Does not have to take a NTG.  Mows the lawn and does yard work without problems.   ? ?Dec. 16, 2014: ? ?[name] is doing well.  Doing yard work without problems.    Occasional episodes ? ?December 12, 2013: ? marsden is doing ok.    Rare CP .  Frequent head aches ? ?Dec. 1 , 2015: ? ?No cardiac complaints.   ?Aches all over.  "staggers "  when he walks or stands. ?No angina.    He notices  That he has occasional episodes of dyspnea.   ?  ?December 13, 2014:  ?[name] is a 81 y.o. male who presents for his  CAD. ?Very rare CP.  ?Has some DOE climbing up his hill at his house. ? ?Nov. 29, 2016: ? ?[name] continues to have some chest pain .    Seems to start in his lower abdomen and moves up. ?Will last for several days.  NTG works at times and at other times, has no effect.  ?Exercising some.   Fatigues easily .  Has leg pain with walking .   ?Thinks the diabetes has a lot to do with his fatigue  . ?Glucose levels are ok  ?Goes to his beach house in [place]  ? ?Nov 12, 2015: ? ?Doing well from a cardiac standpoint. ?Got dizzy - does not know if he had a syncopal episode. ?Had not been out working much. ?He thinks he may have tripped on the step  ? ?Oct. 30 , 2017: ? ?Doing well. ?No CP or dyspnea  ?Has had some sinus issues,  [facility]ent to [facility]. ,   ?Hx of CAD - intolerant to statins  - lots of muscle aches  ? ?January 28, 2017: ? ?[name] is seen today . ?Seems to be stable.   Still has a little CP with walking up hills but this seems to be stable . ? ?Does not tolerate statins ?Is on Niacin,   Last lipids level looks good.  ? ?Feb. 8, 2019 ? ?[name] is seen back today  ?No CP , ?Has some knots on his legs.    ? ?Oct. 22, 2019: ?Seen wife wife today --  ?No CP or dyspnea. ?Has episodes of hypoglycemia and has subsequent dizziness.  ?Still moderately active.    Has some DOE working outside.  ?  Legs and feet hurt him frequently  - has arthritis  ? ?May 10, 2019:  ?[name] is seen today for follow-up visit of his coronary artery disease and congestive heart failure.  I saw him via telemedicine visit in April. ? ?No cp or dyspnea.  Able to do all of his normal activities.  ? ? ?Nov 14, 2020: ?[name] is seen for follow of up his CAD, COPD  ?Is falling some,  Encouraged him to use a cane or walker  ?Is able to get some exercise ,   ? ? ?Nov 11, 2021 ?[name] is seen for follow up of his CAD ?Has poor appetite, loss of energy  ?Echo from 2022 showed normal LV functon  ?Has slowed generally over the past several years  ?Has fallen several times over the past several years  ? ? ? ? ?Past Medical History:  ?Diagnosis Date  ? Atherosclerotic heart disease of native coronary artery without angina pectoris 02/12/2010  ? Qualifier: Diagnosis of  By: Marland Mcalpine    ? Benign prostatic hyperplasia with lower urinary tract symptoms 12/04/2019  ? CAD (coronary artery disease)   ? Hx of   ? Cellulitis 03/19/2012  ?  Chronic obstructive pulmonary disease ([place]) 12/04/2019  ? Diverticulosis of colon (without mention of hemorrhage) 2011  ? Colonoscopy   ? Hiatal hernia 2011  ? EGD  ? Hypertension   ? Hypertensive heart disease without heart failure 12/04/2019  ? ITP (idiopathic thrombocytopenic purpura)   ? He has been diagnosed with  ? Pancytopenia   ? Thrombocytopenia, unspecified ([place]) 01/24/2013  ? TRANSAMINASES, SERUM, ELEVATED 02/12/2010  ? Qualifier: Diagnosis of  By: Marland Mcalpine    ? Type 2 diabetes mellitus with diabetic peripheral angiopathy without gangrene ([place]) 12/04/2019  ? ? ?Past Surgical History:  ?Procedure Laterality Date  ? CARDIAC CATHETERIZATION    ? His last heart catheterization in May of 2009 reveals a patent LIMA  ? CORONARY ANGIOPLASTY    ? Successful percutaneous transluminal coronary angioplasty of the left circumflex obuse marginal vessel  ? CORONARY ANGIOPLASTY WITH STENT PLACEMENT    ? CORONARY ARTERY BYPASS GRAFT    ? x4 with a left internal mammary artery anastomosis to the left anterior descending coronary artery   ? CORONARY STENT PLACEMENT    ? successful percutaneous transluminal coronary angioplasty and stenting of the left main coronary artery  ? HERNIA REPAIR    ? SAPHENOUS VEIN GRAFT RESECTION    ? graft to the first obtuse marginal, a saphenous vein graft to the first diagonal coronary artery  and a saphenous vein graft to the distal right coronary artery  ? US ECHOCARDIOGRAPHY  02-08-2007  ? Est. EF 40-45%  ? ? ? ?Current Outpatient Medications  ?Medication Sig Dispense Refill  ? cholecalciferol (VITAMIN D3) 25 MCG (1000 UT) tablet Take 5,000 Units by mouth daily.    ? clopidogrel (PLAVIX) 75 MG tablet TAKE 1 TABLET BY MOUTH DAILY 90 tablet 1  ? ezetimibe (ZETIA) 10 MG tablet TAKE 1 TABLET BY MOUTH DAILY 90 tablet 0  ? isosorbide mononitrate (IMDUR) 30 MG 24 hr tablet TAKE 1 TABLET BY MOUTH DAILY 90 tablet 1  ? Lancets MISC 1 each by Does not apply route daily as needed. Please provide  lancets that fit patients lancet device 100 each 4  ? lisinopril (ZESTRIL) 5 MG tablet TAKE 1 TABLET BY MOUTH DAILY 90 tablet 2  ? metFORMIN (GLUCOPHAGE) 500 MG tablet Take by mouth in  the morning, at noon, in the evening, and at bedtime.    ? metoprolol tartrate (LOPRESSOR) 25 MG tablet TAKE 1/2 TABLET BY MOUTH DAILY 45 tablet 3  ? Multiple Vitamin (MULTIVITAMIN) tablet Take 1 tablet by mouth daily.    ? ONETOUCH ULTRA test strip AS DIRECTED 100 each 4  ? metFORMIN (GLUCOPHAGE) 500 MG tablet Take 1,000 mg by mouth 2 (two) times daily. (Patient not taking: Reported on 11/11/2021)    ? ?No current facility-administered medications for this visit.  ? ? ?Allergies:   Lipitor [atorvastatin calcium], Crestor [rosuvastatin calcium], and Zocor [simvastatin]  ? ? ?Social History:  The patient  reports that he has never smoked. He has never used smokeless tobacco. He reports that he does not drink alcohol and does not use drugs.  ? ?Family History:  The patient's family history includes Cerebrovascular Accident in his mother.  ? ?ROS: Noted in current history, all other systems are negative..  ? ? ?Physical Exam: ?Blood pressure 120/62, pulse 90, height '5\' 10"'$  (1.778 m), weight 142 lb (64.4 kg), SpO2 98 %. ? ?GEN:  elderly male,   thin,  in no acute distress ?HEENT: Normal ?NECK: No JVD; No carotid bruits ?LYMPHATICS: No lymphadenopathy ?CARDIAC: RRR , soft systolic murmur  ?RESPIRATORY:  Clear to auscultation without rales, wheezing or rhonchi  ?ABDOMEN: Soft, non-tender, non-distended ?MUSCULOSKELETAL:  No edema; No deformity  ?SKIN: Warm and dry ?NEUROLOGIC:  Alert and oriented x 3 ? ? ?EKG:      Nov 11, 2021: Normal sinus rhythm with premature atrial contractions.  Voltages for LVH with repull abnormality. ?. ? ?Recent Labs: ?01/07/2021: TSH 2.370 ?09/17/2021: ALT 12; BUN 10; Creatinine, Ser 0.75; Hemoglobin 14.0; Platelets 140; Potassium 4.8; Sodium 136  ? ?Lipid Panel ?   ?Component Value Date/Time  ? CHOL 157 09/17/2021  0836  ? TRIG 71 09/17/2021 0836  ? HDL 66 09/17/2021 0836  ? CHOLHDL 2.4 09/17/2021 0836  ? CHOLHDL 2.7 05/11/2016 1002  ? VLDL 18 05/11/2016 1002  ? [place] 77 09/17/2021 0836  ? ?  ? ?Wt Readings from Last 3 En

## 2021-11-17 ENCOUNTER — Other Ambulatory Visit: Payer: Self-pay | Admitting: Legal Medicine

## 2021-11-17 NOTE — Telephone Encounter (Signed)
Refill sent to pharmacy.   

## 2021-11-28 NOTE — Telephone Encounter (Signed)
Called patient to schedule AWV.   Patient stated that he declines to complete at this time.

## 2022-01-14 ENCOUNTER — Other Ambulatory Visit: Payer: Self-pay

## 2022-01-14 MED ORDER — CLOPIDOGREL BISULFATE 75 MG PO TABS: 75.0000 mg | ORAL_TABLET | Freq: Every day | ORAL | 3 refills | Status: DC

## 2022-01-14 MED ORDER — ISOSORBIDE MONONITRATE ER 30 MG PO TB24
30.0000 mg | ORAL_TABLET | Freq: Every day | ORAL | 3 refills | Status: DC
Start: 2022-01-14 — End: 2022-01-29

## 2022-01-14 NOTE — Addendum Note (Signed)
Addended by: Carter Kitten D on: 01/14/2022 11:08 AM   Modules accepted: Orders

## 2022-01-19 ENCOUNTER — Ambulatory Visit: Payer: Medicare HMO | Admitting: Podiatry

## 2022-01-24 ENCOUNTER — Emergency Department (HOSPITAL_COMMUNITY): Payer: Medicare HMO

## 2022-01-24 ENCOUNTER — Other Ambulatory Visit: Payer: Self-pay

## 2022-01-24 ENCOUNTER — Encounter (HOSPITAL_COMMUNITY): Payer: Self-pay

## 2022-01-24 ENCOUNTER — Inpatient Hospital Stay (HOSPITAL_COMMUNITY)
Admission: EM | Admit: 2022-01-24 | Discharge: 2022-01-29 | DRG: 286 | Disposition: A | Discharge status: Home Health | Payer: Medicare HMO | Attending: Student in an Organized Health Care Education/Training Program | Admitting: Student in an Organized Health Care Education/Training Program

## 2022-01-24 DIAGNOSIS — I471 Supraventricular tachycardia: Secondary | ICD-10-CM | POA: Diagnosis present

## 2022-01-24 DIAGNOSIS — J9601 Acute respiratory failure with hypoxia: Secondary | ICD-10-CM | POA: Diagnosis not present

## 2022-01-24 DIAGNOSIS — I4891 Unspecified atrial fibrillation: Secondary | ICD-10-CM | POA: Diagnosis present

## 2022-01-24 DIAGNOSIS — R0609 Other forms of dyspnea: Secondary | ICD-10-CM

## 2022-01-24 DIAGNOSIS — I509 Heart failure, unspecified: Secondary | ICD-10-CM

## 2022-01-24 DIAGNOSIS — E876 Hypokalemia: Secondary | ICD-10-CM | POA: Diagnosis not present

## 2022-01-24 DIAGNOSIS — I25810 Atherosclerosis of coronary artery bypass graft(s) without angina pectoris: Secondary | ICD-10-CM | POA: Diagnosis present

## 2022-01-24 DIAGNOSIS — R197 Diarrhea, unspecified: Secondary | ICD-10-CM

## 2022-01-24 DIAGNOSIS — J90 Pleural effusion, not elsewhere classified: Secondary | ICD-10-CM | POA: Diagnosis not present

## 2022-01-24 DIAGNOSIS — E7800 Pure hypercholesterolemia, unspecified: Secondary | ICD-10-CM | POA: Diagnosis present

## 2022-01-24 DIAGNOSIS — I511 Rupture of chordae tendineae, not elsewhere classified: Secondary | ICD-10-CM | POA: Diagnosis present

## 2022-01-24 DIAGNOSIS — I5043 Acute on chronic combined systolic (congestive) and diastolic (congestive) heart failure: Secondary | ICD-10-CM | POA: Diagnosis present

## 2022-01-24 DIAGNOSIS — Z7984 Long term (current) use of oral hypoglycemic drugs: Secondary | ICD-10-CM

## 2022-01-24 DIAGNOSIS — I340 Nonrheumatic mitral (valve) insufficiency: Secondary | ICD-10-CM | POA: Diagnosis present

## 2022-01-24 DIAGNOSIS — R5383 Other fatigue: Secondary | ICD-10-CM | POA: Diagnosis not present

## 2022-01-24 DIAGNOSIS — R0602 Shortness of breath: Secondary | ICD-10-CM

## 2022-01-24 DIAGNOSIS — J449 Chronic obstructive pulmonary disease, unspecified: Secondary | ICD-10-CM | POA: Diagnosis present

## 2022-01-24 DIAGNOSIS — Z7902 Long term (current) use of antithrombotics/antiplatelets: Secondary | ICD-10-CM

## 2022-01-24 DIAGNOSIS — E860 Dehydration: Secondary | ICD-10-CM | POA: Diagnosis present

## 2022-01-24 DIAGNOSIS — N400 Enlarged prostate without lower urinary tract symptoms: Secondary | ICD-10-CM | POA: Diagnosis present

## 2022-01-24 DIAGNOSIS — I110 Hypertensive heart disease with heart failure: Principal | ICD-10-CM | POA: Diagnosis present

## 2022-01-24 DIAGNOSIS — Z888 Allergy status to other drugs, medicaments and biological substances status: Secondary | ICD-10-CM

## 2022-01-24 DIAGNOSIS — I272 Pulmonary hypertension, unspecified: Secondary | ICD-10-CM | POA: Diagnosis present

## 2022-01-24 DIAGNOSIS — I493 Ventricular premature depolarization: Secondary | ICD-10-CM | POA: Diagnosis not present

## 2022-01-24 DIAGNOSIS — I252 Old myocardial infarction: Secondary | ICD-10-CM

## 2022-01-24 DIAGNOSIS — E1151 Type 2 diabetes mellitus with diabetic peripheral angiopathy without gangrene: Secondary | ICD-10-CM | POA: Diagnosis present

## 2022-01-24 DIAGNOSIS — K5900 Constipation, unspecified: Secondary | ICD-10-CM | POA: Diagnosis not present

## 2022-01-24 DIAGNOSIS — Z79899 Other long term (current) drug therapy: Secondary | ICD-10-CM

## 2022-01-24 DIAGNOSIS — J811 Chronic pulmonary edema: Secondary | ICD-10-CM | POA: Diagnosis not present

## 2022-01-24 DIAGNOSIS — E559 Vitamin D deficiency, unspecified: Secondary | ICD-10-CM | POA: Diagnosis present

## 2022-01-24 DIAGNOSIS — Z823 Family history of stroke: Secondary | ICD-10-CM

## 2022-01-24 DIAGNOSIS — Z955 Presence of coronary angioplasty implant and graft: Secondary | ICD-10-CM

## 2022-01-24 DIAGNOSIS — Z951 Presence of aortocoronary bypass graft: Secondary | ICD-10-CM

## 2022-01-24 DIAGNOSIS — D693 Immune thrombocytopenic purpura: Secondary | ICD-10-CM | POA: Diagnosis present

## 2022-01-24 LAB — CBC WITH DIFFERENTIAL/PLATELET
Abs Immature Granulocytes: 0.03 10*3/uL (ref 0.00–0.07)
Basophils Absolute: 0 10*3/uL (ref 0.0–0.1)
Basophils Relative: 0 %
Eosinophils Absolute: 0 10*3/uL (ref 0.0–0.5)
Eosinophils Relative: 0 %
HCT: 33.4 % — ABNORMAL LOW (ref 39.0–52.0)
Hemoglobin: 11.4 g/dL — ABNORMAL LOW (ref 13.0–17.0)
Immature Granulocytes: 0 %
Lymphocytes Relative: 13 %
Lymphs Abs: 0.9 10*3/uL (ref 0.7–4.0)
MCH: 31.9 pg (ref 26.0–34.0)
MCHC: 34.1 g/dL (ref 30.0–36.0)
MCV: 93.6 fL (ref 80.0–100.0)
Monocytes Absolute: 0.4 10*3/uL (ref 0.1–1.0)
Monocytes Relative: 7 %
Neutro Abs: 5.3 10*3/uL (ref 1.7–7.7)
Neutrophils Relative %: 80 %
Platelets: 151 10*3/uL (ref 150–400)
RBC: 3.57 MIL/uL — ABNORMAL LOW (ref 4.22–5.81)
RDW: 13.4 % (ref 11.5–15.5)
WBC: 6.7 10*3/uL (ref 4.0–10.5)
nRBC: 0 % (ref 0.0–0.2)

## 2022-01-24 LAB — BASIC METABOLIC PANEL
Anion gap: 12 (ref 5–15)
BUN: 13 mg/dL (ref 8–23)
CO2: 22 mmol/L (ref 22–32)
Calcium: 9.1 mg/dL (ref 8.9–10.3)
Chloride: 103 mmol/L (ref 98–111)
Creatinine, Ser: 0.79 mg/dL (ref 0.61–1.24)
GFR, Estimated: >60 mL/min (ref >60)
Glucose, Bld: 145 mg/dL — ABNORMAL HIGH (ref 70–99)
Potassium: 3.7 mmol/L (ref 3.5–5.1)
Sodium: 137 mmol/L (ref 135–145)

## 2022-01-24 LAB — VITAMIN D 25 HYDROXY (VIT D DEFICIENCY, FRACTURES): Vit D, 25-Hydroxy: 78.1 ng/mL (ref 30–100)

## 2022-01-24 LAB — TROPONIN I (HIGH SENSITIVITY)
Troponin I (High Sensitivity): 57 ng/L — ABNORMAL HIGH (ref <18)
Troponin I (High Sensitivity): 59 ng/L — ABNORMAL HIGH (ref <18)

## 2022-01-24 LAB — TSH: TSH: 2.684 u[IU]/mL (ref 0.350–4.500)

## 2022-01-24 LAB — BRAIN NATRIURETIC PEPTIDE: B Natriuretic Peptide: 1092.3 pg/mL — ABNORMAL HIGH (ref 0.0–100.0)

## 2022-01-24 LAB — MAGNESIUM: Magnesium: 1.5 mg/dL — ABNORMAL LOW (ref 1.7–2.4)

## 2022-01-24 MED ORDER — ISOSORBIDE MONONITRATE ER 30 MG PO TB24: 30.0000 mg | ORAL_TABLET | Freq: Every day | ORAL | Status: DC

## 2022-01-24 MED ORDER — MAGNESIUM SULFATE 2 GM/50ML IV SOLN
2.0000 g | Freq: Once | INTRAVENOUS | Status: AC
  Administered 2022-01-24: 2 g via INTRAVENOUS
  Filled 2022-01-24: qty 50

## 2022-01-24 MED ORDER — ACETAMINOPHEN 650 MG RE SUPP: 650.0000 mg | Freq: Four times a day (QID) | RECTAL | Status: DC | PRN

## 2022-01-24 MED ORDER — ALBUTEROL SULFATE HFA 108 (90 BASE) MCG/ACT IN AERS: 2.0000 | INHALATION_SPRAY | RESPIRATORY_TRACT | Status: DC | PRN

## 2022-01-24 MED ORDER — FUROSEMIDE 10 MG/ML IJ SOLN
40.0000 mg | Freq: Every day | INTRAMUSCULAR | Status: DC
  Administered 2022-01-25: 40 mg via INTRAVENOUS
  Filled 2022-01-24: qty 4

## 2022-01-24 MED ORDER — ISOSORBIDE MONONITRATE ER 30 MG PO TB24
30.0000 mg | ORAL_TABLET | Freq: Every day | ORAL | Status: DC
  Administered 2022-01-25 – 2022-01-28 (×3): 30 mg via ORAL
  Filled 2022-01-24 (×4): qty 1

## 2022-01-24 MED ORDER — CLOPIDOGREL BISULFATE 75 MG PO TABS
75.0000 mg | ORAL_TABLET | Freq: Every day | ORAL | Status: DC
  Administered 2022-01-25 – 2022-01-29 (×5): 75 mg via ORAL
  Filled 2022-01-24 (×5): qty 1

## 2022-01-24 MED ORDER — DILTIAZEM HCL-DEXTROSE 125-5 MG/125ML-% IV SOLN (PREMIX)
5.0000 mg/h | INTRAVENOUS | Status: DC
  Filled 2022-01-24: qty 125

## 2022-01-24 MED ORDER — DILTIAZEM LOAD VIA INFUSION
10.0000 mg | Freq: Once | INTRAVENOUS | Status: DC
  Filled 2022-01-24: qty 10

## 2022-01-24 MED ORDER — ACETAMINOPHEN 325 MG PO TABS
650.0000 mg | ORAL_TABLET | Freq: Four times a day (QID) | ORAL | Status: DC | PRN
  Filled 2022-01-24 (×2): qty 2

## 2022-01-24 MED ORDER — EZETIMIBE 10 MG PO TABS
10.0000 mg | ORAL_TABLET | Freq: Every day | ORAL | Status: DC
  Administered 2022-01-25 – 2022-01-29 (×5): 10 mg via ORAL
  Filled 2022-01-24 (×5): qty 1

## 2022-01-24 MED ORDER — SENNOSIDES-DOCUSATE SODIUM 8.6-50 MG PO TABS: 1.0000 | ORAL_TABLET | Freq: Every evening | ORAL | Status: DC | PRN

## 2022-01-24 MED ORDER — ALBUTEROL SULFATE (2.5 MG/3ML) 0.083% IN NEBU
2.5000 mg | INHALATION_SOLUTION | RESPIRATORY_TRACT | Status: DC | PRN
  Administered 2022-01-25: 2.5 mg via RESPIRATORY_TRACT
  Filled 2022-01-24: qty 3

## 2022-01-24 MED ORDER — FUROSEMIDE 10 MG/ML IJ SOLN
40.0000 mg | Freq: Once | INTRAMUSCULAR | Status: AC
  Administered 2022-01-24: 40 mg via INTRAVENOUS
  Filled 2022-01-24: qty 4

## 2022-01-24 MED ORDER — ENOXAPARIN SODIUM 40 MG/0.4ML IJ SOSY
40.0000 mg | PREFILLED_SYRINGE | INTRAMUSCULAR | Status: DC
Start: 2022-01-24 — End: 2022-01-27
  Administered 2022-01-24 – 2022-01-26 (×3): 40 mg via SUBCUTANEOUS
  Filled 2022-01-24 (×3): qty 0.4

## 2022-01-24 MED ORDER — METOPROLOL TARTRATE 5 MG/5ML IV SOLN
5.0000 mg | Freq: Once | INTRAVENOUS | Status: AC
  Administered 2022-01-24: 5 mg via INTRAVENOUS
  Filled 2022-01-24: qty 5

## 2022-01-24 MED ORDER — ADULT MULTIVITAMIN W/MINERALS CH
1.0000 | ORAL_TABLET | Freq: Every day | ORAL | Status: DC
  Administered 2022-01-24 – 2022-01-29 (×6): 1 via ORAL
  Filled 2022-01-24 (×6): qty 1

## 2022-01-24 MED ORDER — VITAMIN D 25 MCG (1000 UNIT) PO TABS
5000.0000 [IU] | ORAL_TABLET | Freq: Every day | ORAL | Status: DC
Start: 2022-01-25 — End: 2022-01-29
  Administered 2022-01-25 – 2022-01-29 (×5): 5000 [IU] via ORAL
  Filled 2022-01-24 (×4): qty 5

## 2022-01-24 MED ORDER — METOPROLOL SUCCINATE ER 25 MG PO TB24
25.0000 mg | ORAL_TABLET | Freq: Every day | ORAL | Status: DC
  Administered 2022-01-24 – 2022-01-25 (×2): 25 mg via ORAL
  Filled 2022-01-24 (×2): qty 1

## 2022-01-24 MED ORDER — ONDANSETRON HCL 4 MG/2ML IJ SOLN
4.0000 mg | Freq: Four times a day (QID) | INTRAMUSCULAR | Status: DC | PRN
Start: 2022-01-24 — End: 2022-01-26

## 2022-01-24 MED ORDER — ONDANSETRON HCL 4 MG PO TABS: 4.0000 mg | ORAL_TABLET | Freq: Four times a day (QID) | ORAL | Status: DC | PRN

## 2022-01-24 MED ORDER — APIXABAN 5 MG PO TABS: 5.0000 mg | ORAL_TABLET | Freq: Two times a day (BID) | ORAL | Status: DC

## 2022-01-24 NOTE — Progress Notes (Addendum)
Asked to review EKGs again, upon further review patient is not in afib but sinus with PACs. Cardiology fellow has communicated with primary team can d/c anticoagulation, would continue to need beta blocker titration.    Carlyle Dolly MD

## 2022-01-24 NOTE — ED Triage Notes (Signed)
Pt arrived POV from urgent care c/o [facility] x3-4 days. Pt was seen at urgent care and told he has a new onset of a-fib. Pt denies any cp.

## 2022-01-24 NOTE — H&P (Cosign Needed Addendum)
Date: 01/24/2022              Patient Name:  [name] MRN: [id number]  DOB: December 24, 1940 Age / Sex: 81 y.o., male   PCP: Lillard Anes, MD         Medical Service: Internal Medicine Teaching Service         Attending Physician: Dr. Dareen Piano    First Contact: Dr. Carin Primrose Pager: [phone]  Second Contact: Dr. Coy Saunas Pager: [phone]       After Hours (After 5p/  First Contact Pager: [phone]  weekends / holidays): Second Contact Pager: [phone]   Chief Complaint: Shortness of breath  History of Present Illness:  Since Monday and Tuesday, this week, he has had progressive weakness. He also reports being winded easily since Monday. He run out of breath after talking for a long time. He has dyspnea on exertion just walking a few feet. He denies any current chest pain but reports that on Tuesday, he had a sensation of lighting striking his chest for a few seconds that resolved with nitro. Per daughter, patient had been doing a lot more activity this week than normal.  Patient describes working outside with a Scientist, research (life sciences), and doing more work recently than he is accustomed to.  He thinks that maybe he has not been drinking enough water, which both daughters in the room corroborate.  Patient is not aware of any history of heart failure but does report that he has taken Lasix in the past.  Denies any palpation, wheezing, chest pressure, sick contact, abd pain, nausea, vomiting, hematuria.  Denies increased leg swelling.  Reports some diarrhea this AM. Reports some decreased urine output.   Went to emergency room in [place] said he had a "block" He was instructed to come here.   ED course: EKG showed regular sinus tachycardia with frequent ectopic beats.   CXR with mild pulmonary edema  Magnesium 1.5 BNP 1092.3 CBC and BMP unremarkable  - Mag sulfate 2 g IV - Metoprolol 5 mg IV - Diltiazem 125 mg IV infusion   Meds:  Current Meds  Medication Sig    cholecalciferol (VITAMIN D3) 25 MCG (1000 UT) tablet Take 5,000 Units by mouth daily.   ezetimibe (ZETIA) 10 MG tablet TAKE 1 TABLET BY MOUTH DAILY   isosorbide mononitrate (IMDUR) 30 MG 24 hr tablet Take 1 tablet (30 mg total) by mouth daily.   lisinopril (ZESTRIL) 5 MG tablet TAKE 1 TABLET BY MOUTH DAILY   metFORMIN (GLUCOPHAGE) 500 MG tablet Take 500 mg by mouth in the morning, at noon, in the evening, and at bedtime.   metoprolol tartrate (LOPRESSOR) 25 MG tablet TAKE 1/2 TABLET BY MOUTH DAILY (Patient taking differently: Take 12.5 mg by mouth every evening.)   Multiple Vitamin (MULTIVITAMIN) tablet Take 1 tablet by mouth daily.   nitroGLYCERIN (NITROSTAT) 0.4 MG SL tablet Place 0.4 mg under the tongue every 5 (five) minutes as needed for chest pain.   [DISCONTINUED] clopidogrel (PLAVIX) 75 MG tablet Take 1 tablet (75 mg total) by mouth daily.     Allergies: Allergies as of 01/24/2022 - Review Complete 01/24/2022  Allergen Reaction Noted   Lipitor [atorvastatin calcium] Other (See Comments) 04/15/2011   Crestor [rosuvastatin calcium] Other (See Comments) 04/15/2011   Zocor [simvastatin] Other (See Comments) 04/15/2011   Past Medical History:  Diagnosis Date   Atherosclerotic heart disease of native coronary artery without angina pectoris 02/12/2010   Qualifier: Diagnosis of  By:  Raymondo Band, Barbara     Benign prostatic hyperplasia with lower urinary tract symptoms 12/04/2019   CAD (coronary artery disease)    Hx of    Cellulitis 03/19/2012   Chronic obstructive pulmonary disease ([place]) 12/04/2019   Diverticulosis of colon (without mention of hemorrhage) 2011   Colonoscopy    Hiatal hernia 2011   EGD   Hypertension    Hypertensive heart disease without heart failure 12/04/2019   ITP (idiopathic thrombocytopenic purpura)    He has been diagnosed with   Pancytopenia    Thrombocytopenia, unspecified ([place]) 01/24/2013   TRANSAMINASES, SERUM, ELEVATED 02/12/2010   Qualifier: Diagnosis of   By: Marland Mcalpine     Type 2 diabetes mellitus with diabetic peripheral angiopathy without gangrene ([place]) 12/04/2019    Family History: Mother had stroke  Social History: Lives with wife. Sold tire supplies and other material. Denies any tobacco, ETOH or illicit drug use.  Review of Systems: A complete ROS was negative except as per HPI.  Physical Exam: Blood pressure 110/80, pulse 97, temperature 97.8 F (36.6 C), resp. rate (!) 27, height '5\' 9"'$  (1.753 m), weight 66.2 kg, SpO2 91 %. General: Anxious appearing male.  No apparent acute discomfort. HEENT: Dentures Cardiovascular: Irregular tachycardia.  Soft systolic murmur left sternal border. Respiratory: Clear to auscultation bilaterally.  No wheezing or crackles.  Normal work of breathing. Abdominal: Soft.  Nontender. Extremities: Left and right radial pulses 2+.  Trace nonpitting lower extremity edema. Skin: No rashes or lesions on visible skin.  Decreased skin turgor. Neuro: Alert and oriented x4. Psych: Anxious mood and affect.  I/O: approximately 1400 out  Labs: BNP: 1092.3 Magnesium: 1.5 Hemoglobin: 11.4 Creatinine: 0.79  EKG: Narrow complex irregular tachycardia.  P waves present.  CXR: Slight pulmonary interstitial edema.  Assessment & Plan  [name] is a 81 y.o. with a PMH of coronary artery disease, coronary artery bypass surgery, who presented with shortness of breath and generalized weakness for 5 days and is admitted for multifocal atrial tachycardia.  Principal Problem:   Multifocal atrial tachycardia (HCC) Active Problems:   Shortness of breath   Diarrhea  Multifocal atrial tachycardia EKG with a few regularly spaced P waves with regular PR interval and many ectopic beats.  I do not think this represents atrial fibrillation.  Suspect this has some as yet unknown underlying etiology although the patient seems to be in his usual state of health prior to onset of symptoms.  Electrolyte  abnormality secondary to dehydration could be contributing.  Presentation is not classic for ACS, although in this elderly gentleman with difficult burden of atherosclerotic cardiovascular disease my index of suspicion is elevated.  Cardiology was consulted and their input is appreciated. - Start metoprolol succinate 25 mg p.o. daily. - Status post 2 g of mag sulfate. - Continuous monitoring on telemetry. - Follow-up troponin - Follow-up TSH - Repeat a.m. BMP - Repeat a.m. mag  Shortness of breath Heart failure versus COPD exacerbation Elevated BNP in setting of potentially prolonged tachyarrhythmia makes me concerned for new heart failure.  Risk factors for ischemic heart disease include coronary artery disease and prior MI.  Per chart review patient also has history of COPD although his exam is not consistent with COPD exacerbation.  I do not suspect underlying pneumonia at this time.  We will attempt to treat tachyarrhythmia as presumed underlying cause. - Start furosemide 40 mg IV daily. - Start albuterol 2.5 mg nebulized every 4 hours as needed for  wheezing or shortness of breath. - TTE. - Strict I/O's and daily weights. - Use caution with IV fluids.  Diarrhea 4 episodes of diarrhea since this morning.  Could be exacerbating underlying dehydration that may be contributing to this patient's overall clinical picture. - Reassess tomorrow.  Coronary artery disease with angina No chest pain, pressure, tightness today. - Continue clopidogrel 5 mg p.o. daily - Continue Imdur 30 mg p.o. daily  Hyperlipidemia - Continue home ezetimibe.  Vitamin D deficiency - Continue home cholecalciferol. - Follow-up vitamin D level.  ITP Followed by Dr. Julien Nordmann at the cancer center.  Chronic, stable condition.  Platelets are normal at 151 on admission today.  Advance care planning Patient wishes for full scope of medical treatment including CPR, intubation, and ICU level care should his condition  deteriorate.  He designates his daughters as his surrogate decision makers.  VTE prophylaxis - Lovenox  Transitions of care PT OT consult.  Dispo: Admit patient to Observation with expected length of stay less than 2 midnights.  Signed: Nani Gasser, MD 01/24/2022, 7:48 PM  Pager: [phone] After 5pm on weekdays and 1pm on weekends: On Call pager: [phone]

## 2022-01-24 NOTE — ED Provider Notes (Signed)
3:43 PM Care assumed from Dr. Langston Masker.  At time of transfer of care, patient is awaiting reassessment to see if heart rate has improved.  Patient was previously on Plavix.  Patient was found to have new atrial fibrillation with intermittent RVR with rates into the 130s here.  Initially, plan of care was to try to get rate control, switch him to Eliquis, and have him follow-up with outpatient cardiology.  Prior to discharge, patient's heart rate went back into the 130s.  He was given oral metoprolol and plan is to reassess.  If his heart rate does not come down or he still feels ill, anticipate admission for further evaluation and management of new A-fib with intermittent RVR and tachycardia.  4:05 PM Prior to the previous team leaving, the rest of the patient heart rate is still in the 130s intermittently.  Patient is still feeling fatigued and short of breath and he does not feel safe going home.  Patient started on a diltiazem drip by previous team and he will be admitted for further management of new A-fib with RVR.  He will likely need monitoring during diuresis, rate control, and likely echo.  Patient will be admitted for further management   Aujanae Mccullum, Gwenyth Allegra, MD 01/24/22 [phone]

## 2022-01-24 NOTE — ED Provider Notes (Addendum)
[facility] EMERGENCY DEPARTMENT Provider Note   CSN: [id number] Arrival date & time: 01/24/22  1057     History  Chief Complaint  Patient presents with   Shortness of Breath    [name] is a 81 y.o. male with history of coronary disease status post CABG and stenting, high cholesterol, on Plavix, presenting to the ED with concern for shortness of breath and fatigue.  Supplemental history provided by the patient's daughter, reports the patient called her complaining of heart fluttering and shortness of breath perhaps 4 days ago.  He reports that today he just seemed extremely fatigued when he was trying to get up and go around the house.  He went to urgent care where he was diagnosed with A-fib and referred into the ED.  He denies any known history of A-fib in the past.  He does report a significant history of coronary disease as delineated above.  He does take Imdur at home.  He used to be on aspirin but was weaned off of this by his cardiologist, and now has been on Plavix alone for over 15 years.  He does not take any other anticoagulation  He does take 12.5 mg of metoprolol daily.  HPI     Home Medications Prior to Admission medications   Medication Sig Start Date End Date Taking? Authorizing Provider  cholecalciferol (VITAMIN D3) 25 MCG (1000 UT) tablet Take 5,000 Units by mouth daily.   Yes [provider]  clopidogrel (PLAVIX) 75 MG tablet Take 1 tablet (75 mg total) by mouth daily. 01/14/22  Yes Nahser, Wonda Cheng, MD  ezetimibe (ZETIA) 10 MG tablet TAKE 1 TABLET BY MOUTH DAILY 10/13/21  Yes Nahser, Wonda Cheng, MD  isosorbide mononitrate (IMDUR) 30 MG 24 hr tablet Take 1 tablet (30 mg total) by mouth daily. 01/14/22  Yes Nahser, Wonda Cheng, MD  lisinopril (ZESTRIL) 5 MG tablet TAKE 1 TABLET BY MOUTH DAILY 10/13/21  Yes Lillard Anes, MD  metFORMIN (GLUCOPHAGE) 500 MG tablet Take 500 mg by mouth in the morning, at noon, in the evening, and at  bedtime.   Yes [provider]  metoprolol tartrate (LOPRESSOR) 25 MG tablet TAKE 1/2 TABLET BY MOUTH DAILY Patient taking differently: Take 12.5 mg by mouth every evening. 04/07/21  Yes Lillard Anes, MD  Multiple Vitamin (MULTIVITAMIN) tablet Take 1 tablet by mouth daily.   Yes [provider]  nitroGLYCERIN (NITROSTAT) 0.4 MG SL tablet Place 0.4 mg under the tongue every 5 (five) minutes as needed for chest pain.   Yes [provider]  Lancets MISC 1 each by Does not apply route daily as needed. Please provide lancets that fit patients lancet device 10/09/19   Lillard Anes, MD  [facility] ULTRA test strip AS DIRECTED 11/17/21   Lillard Anes, MD      Allergies    Lipitor [atorvastatin calcium], Crestor [rosuvastatin calcium], and Zocor [simvastatin]    Review of Systems   Review of Systems  Physical Exam Updated Vital Signs BP 113/72   Pulse 89   Temp 97.8 F (36.6 C)   Resp (!) 26   Ht '5\' 9"'$  (1.753 m)   Wt 66.2 kg   SpO2 93%   BMI 21.56 kg/m  Physical Exam Constitutional:      General: He is not in acute distress. HENT:     Head: Normocephalic and atraumatic.  Eyes:     Conjunctiva/sclera: Conjunctivae normal.     Pupils:  Pupils are equal, round, and reactive to light.  Cardiovascular:     Rate and Rhythm: Tachycardia present. Rhythm irregular.  Pulmonary:     Effort: Pulmonary effort is normal. No respiratory distress.  Abdominal:     General: There is no distension.     Tenderness: There is no abdominal tenderness.  Skin:    General: Skin is warm and dry.  Neurological:     General: No focal deficit present.     Mental Status: He is alert. Mental status is at baseline.  Psychiatric:        Mood and Affect: Mood normal.        Behavior: Behavior normal.     ED Results / Procedures / Treatments   Labs (all labs ordered are listed, but only abnormal results are displayed) Labs Reviewed  BASIC METABOLIC PANEL  - Abnormal; Notable for the following components:      Result Value   Glucose, Bld 145 (*)    All other components within normal limits  CBC WITH DIFFERENTIAL/PLATELET - Abnormal; Notable for the following components:   RBC 3.57 (*)    Hemoglobin 11.4 (*)    HCT 33.4 (*)    All other components within normal limits  MAGNESIUM - Abnormal; Notable for the following components:   Magnesium 1.5 (*)    All other components within normal limits  BRAIN NATRIURETIC PEPTIDE - Abnormal; Notable for the following components:   B Natriuretic Peptide 1,092.3 (*)    All other components within normal limits    EKG EKG Interpretation  Date/Time:  Saturday January 24 2022 11:20:07 EDT Ventricular Rate:  115 PR Interval:  172 QRS Duration: 96 QT Interval:  342 QTC Calculation: 473 R Axis:   -5 Text Interpretation: A fib No acute ischemic findings Confirmed by Octaviano Glow [phone]) on 01/24/2022 12:01:46 PM  Radiology DG Chest 2 View  Result Date: 01/24/2022 CLINICAL DATA:  Acute shortness of breath. EXAM: CHEST - 2 VIEW COMPARISON:  12/27/2009 chest CT FINDINGS: Cardiomegaly and CABG changes noted. Pulmonary vascular congestion and mild interstitial opacities are present likely representing mild interstitial edema. Small bilateral pleural effusions and bibasilar opacities/atelectasis noted. There is no evidence of pneumothorax or acute bony abnormality. IMPRESSION: Cardiomegaly with mild interstitial pulmonary edema, small bilateral pleural effusions and bibasilar-favor atelectasis. Electronically Signed   By: Margarette Canada M.D.   On: 01/24/2022 12:36    Procedures .Critical Care  Performed by: Wyvonnia Dusky, MD Authorized by: Wyvonnia Dusky, MD   Critical care provider statement:    Critical care time (minutes):  45   Critical care time was exclusive of:  Separately billable procedures and treating other patients   Critical care was necessary to treat or prevent imminent or  life-threatening deterioration of the following conditions:  Circulatory failure   Critical care was time spent personally by me on the following activities:  Ordering and performing treatments and interventions, ordering and review of laboratory studies, ordering and review of radiographic studies, pulse oximetry, review of old charts, examination of patient and evaluation of patient's response to treatment Comments:     A fib with RVR rate control, telemetry monitoring     Medications Ordered in ED Medications  albuterol (VENTOLIN HFA) 108 (90 Base) MCG/ACT inhaler 2 puff (has no administration in time range)  metoprolol succinate (TOPROL-XL) 24 hr tablet 25 mg (25 mg Oral Given 01/24/22 1416)  diltiazem (CARDIZEM) 1 mg/mL load via infusion 10 mg (has no administration in  time range)    And  diltiazem (CARDIZEM) 125 mg in dextrose 5% 125 mL (1 mg/mL) infusion (has no administration in time range)  metoprolol tartrate (LOPRESSOR) injection 5 mg (5 mg Intravenous Given 01/24/22 1303)  furosemide (LASIX) injection 40 mg (40 mg Intravenous Given 01/24/22 1417)  magnesium sulfate IVPB 2 g 50 mL (2 g Intravenous New Bag/Given 01/24/22 1516)    ED Course/ Medical Decision Making/ A&P Clinical Course as of 01/24/22 1643  Sat Jan 24, 2022  1404 HR 80-90's, still A Fib, but improved.  X-ray showing some pulmonary edema.  Suspect this may be why is feeling short of breath, possible mild congestive heart failure in the setting of rapid A-fib.  We can start him on some Lasix here.  Awaiting labs. [MT]  1435 B Natriuretic Peptide(!): 1,092.3 [MT]  1435 I spoke to DR Harl Bowie from cardiology who will place some medication recommendations in pt's chart - agrees with rate control, and could be discharged after diuresis if feeling better. [MT]  3536 Patient reassessed after medications, continues to be tachycardic and short of breath with ambulation.  She has urinated several times.  At this point I do believe be  reasonable to admit him in the hospital for continued HR control and monitoring, and diuresis overnight.  Possible echocardiogram if this can be obtained tomorrow.  Patient and his family are in agreement with this plan [MT]    Clinical Course User Index [MT] Kenniya Westrich, Carola Rhine, MD                           Medical Decision Making Amount and/or Complexity of Data Reviewed Labs: ordered. Decision-making details documented in ED Course. Radiology: ordered.  Risk Prescription drug management. Decision regarding hospitalization.   This patient presents to the ED with concern for shortness of breath,. This involves an extensive number of treatment options, and is a complaint that carries with it a high risk of complications and morbidity.  The differential diagnosis includes new onset A-fib with RVR versus anemia versus pneumonia versus pulmonary edema versus other  Co-morbidities that complicate the patient evaluation: History of known coronary disease raises risk for cardiomyopathy and cardiac disease, including arrhythmia  Additional history obtained from patient's daughter at bedside  External records from outside source obtained and reviewed including cardiology office eval 11/11/21 Dr Johnsie Cancel noting history of chronic diastolic heart failure, and coronary disease status post CABG, and hyperlipidemia.  I ordered and personally interpreted labs.  The pertinent results include:  mag 1.5, K wnl, BNP 1093, hgb 11.4  I ordered imaging studies including dg chest I independently visualized and interpreted imaging which showed cardiomegaly, pulm edema I agree with the radiologist interpretation  The patient was maintained on a cardiac monitor.  I personally viewed and interpreted the cardiac monitored which showed an underlying rhythm of: A fib with HR 80-140 bpm average  Per my interpretation the patient's ECG shows A fib with rvr  I ordered medication including IV metoprolol, IV lasix, IV  diltiazem for HR control for A Fib RVR and diuresis for CHF/pulm edema  I have reviewed the patients home medicines and have made adjustments as needed  Test Considered: Doubt acute PE clinically - did not feel CT PE emergently indicated at this time.  I requested consultation with the cardiology,  and discussed lab and imaging findings as well as pertinent plan - they recommend: see ed course  After the interventions noted  above, I reevaluated the patient and found that they have: stayed the same  My overall suspicion for ACS/PE is low at this time.  No chest pain, no hypoxia.    Dispostion:  After consideration of the diagnostic results and the patients response to treatment, I feel that the patent would benefit from medical admission for HR control, diuresis, echocardiogram.         Final Clinical Impression(s) / ED Diagnoses Final diagnoses:  Atrial fibrillation with RVR (HCC)  SOB (shortness of breath)    Rx / DC Orders ED Discharge Orders     None         Avante Carneiro, Carola Rhine, MD 01/24/22 1645    Wyvonnia Dusky, MD 01/24/22 1645

## 2022-01-24 NOTE — Plan of Care (Signed)
Personally reviewed his ECGs (24-Jan-2022 11:20:07, 24-Jan-2022 11:20:07, 24-Jan-2022 19:22:44) and Telemetry (until 7/15 20:00).  Spoke with Dr. Carlyle Dolly, It looks like patient had multiple PVC and PAC, and narrow QRS tachycardia (AT?) in the ED. He is currently in sinus rhythm. Spoke with the primary team, continue telemetry and agree with beta-blocker, no anticoagulation at this point, continue home plavix. Cardiology will arrange outpatient follow-up.

## 2022-01-25 ENCOUNTER — Observation Stay (HOSPITAL_COMMUNITY): Payer: Medicare HMO

## 2022-01-25 DIAGNOSIS — I2510 Atherosclerotic heart disease of native coronary artery without angina pectoris: Secondary | ICD-10-CM | POA: Diagnosis not present

## 2022-01-25 DIAGNOSIS — I5043 Acute on chronic combined systolic (congestive) and diastolic (congestive) heart failure: Secondary | ICD-10-CM | POA: Diagnosis not present

## 2022-01-25 DIAGNOSIS — R0602 Shortness of breath: Secondary | ICD-10-CM

## 2022-01-25 DIAGNOSIS — I25110 Atherosclerotic heart disease of native coronary artery with unstable angina pectoris: Secondary | ICD-10-CM | POA: Diagnosis not present

## 2022-01-25 DIAGNOSIS — Z951 Presence of aortocoronary bypass graft: Secondary | ICD-10-CM | POA: Diagnosis not present

## 2022-01-25 DIAGNOSIS — I5032 Chronic diastolic (congestive) heart failure: Secondary | ICD-10-CM | POA: Diagnosis not present

## 2022-01-25 DIAGNOSIS — I4891 Unspecified atrial fibrillation: Secondary | ICD-10-CM | POA: Diagnosis not present

## 2022-01-25 DIAGNOSIS — I509 Heart failure, unspecified: Secondary | ICD-10-CM

## 2022-01-25 DIAGNOSIS — R0600 Dyspnea, unspecified: Secondary | ICD-10-CM | POA: Diagnosis not present

## 2022-01-25 DIAGNOSIS — I340 Nonrheumatic mitral (valve) insufficiency: Secondary | ICD-10-CM | POA: Diagnosis not present

## 2022-01-25 DIAGNOSIS — D693 Immune thrombocytopenic purpura: Secondary | ICD-10-CM | POA: Diagnosis not present

## 2022-01-25 DIAGNOSIS — J90 Pleural effusion, not elsewhere classified: Secondary | ICD-10-CM | POA: Diagnosis not present

## 2022-01-25 DIAGNOSIS — I5041 Acute combined systolic (congestive) and diastolic (congestive) heart failure: Secondary | ICD-10-CM | POA: Diagnosis not present

## 2022-01-25 DIAGNOSIS — I511 Rupture of chordae tendineae, not elsewhere classified: Secondary | ICD-10-CM | POA: Diagnosis not present

## 2022-01-25 DIAGNOSIS — Z823 Family history of stroke: Secondary | ICD-10-CM | POA: Diagnosis not present

## 2022-01-25 DIAGNOSIS — Z888 Allergy status to other drugs, medicaments and biological substances status: Secondary | ICD-10-CM | POA: Diagnosis not present

## 2022-01-25 DIAGNOSIS — D649 Anemia, unspecified: Secondary | ICD-10-CM | POA: Diagnosis not present

## 2022-01-25 DIAGNOSIS — E1151 Type 2 diabetes mellitus with diabetic peripheral angiopathy without gangrene: Secondary | ICD-10-CM | POA: Diagnosis not present

## 2022-01-25 DIAGNOSIS — Z7902 Long term (current) use of antithrombotics/antiplatelets: Secondary | ICD-10-CM | POA: Diagnosis not present

## 2022-01-25 DIAGNOSIS — I110 Hypertensive heart disease with heart failure: Secondary | ICD-10-CM | POA: Diagnosis not present

## 2022-01-25 DIAGNOSIS — I471 Supraventricular tachycardia: Secondary | ICD-10-CM | POA: Diagnosis not present

## 2022-01-25 DIAGNOSIS — N400 Enlarged prostate without lower urinary tract symptoms: Secondary | ICD-10-CM | POA: Diagnosis not present

## 2022-01-25 DIAGNOSIS — J449 Chronic obstructive pulmonary disease, unspecified: Secondary | ICD-10-CM | POA: Diagnosis not present

## 2022-01-25 DIAGNOSIS — R0609 Other forms of dyspnea: Secondary | ICD-10-CM

## 2022-01-25 DIAGNOSIS — J9601 Acute respiratory failure with hypoxia: Secondary | ICD-10-CM

## 2022-01-25 DIAGNOSIS — I252 Old myocardial infarction: Secondary | ICD-10-CM | POA: Diagnosis not present

## 2022-01-25 DIAGNOSIS — I272 Pulmonary hypertension, unspecified: Secondary | ICD-10-CM | POA: Diagnosis not present

## 2022-01-25 DIAGNOSIS — Z7984 Long term (current) use of oral hypoglycemic drugs: Secondary | ICD-10-CM | POA: Diagnosis not present

## 2022-01-25 DIAGNOSIS — Z955 Presence of coronary angioplasty implant and graft: Secondary | ICD-10-CM | POA: Diagnosis not present

## 2022-01-25 DIAGNOSIS — E860 Dehydration: Secondary | ICD-10-CM | POA: Diagnosis not present

## 2022-01-25 DIAGNOSIS — E119 Type 2 diabetes mellitus without complications: Secondary | ICD-10-CM | POA: Diagnosis not present

## 2022-01-25 DIAGNOSIS — E559 Vitamin D deficiency, unspecified: Secondary | ICD-10-CM | POA: Diagnosis not present

## 2022-01-25 DIAGNOSIS — Z79899 Other long term (current) drug therapy: Secondary | ICD-10-CM | POA: Diagnosis not present

## 2022-01-25 DIAGNOSIS — I083 Combined rheumatic disorders of mitral, aortic and tricuspid valves: Secondary | ICD-10-CM | POA: Diagnosis not present

## 2022-01-25 DIAGNOSIS — I25810 Atherosclerosis of coronary artery bypass graft(s) without angina pectoris: Secondary | ICD-10-CM | POA: Diagnosis not present

## 2022-01-25 DIAGNOSIS — E7800 Pure hypercholesterolemia, unspecified: Secondary | ICD-10-CM | POA: Diagnosis not present

## 2022-01-25 LAB — BASIC METABOLIC PANEL
Anion gap: 10 (ref 5–15)
BUN: 12 mg/dL (ref 8–23)
CO2: 25 mmol/L (ref 22–32)
Calcium: 8.9 mg/dL (ref 8.9–10.3)
Chloride: 101 mmol/L (ref 98–111)
Creatinine, Ser: 0.9 mg/dL (ref 0.61–1.24)
GFR, Estimated: >60 mL/min (ref >60)
Glucose, Bld: 147 mg/dL — ABNORMAL HIGH (ref 70–99)
Potassium: 3.9 mmol/L (ref 3.5–5.1)
Sodium: 136 mmol/L (ref 135–145)

## 2022-01-25 LAB — ECHOCARDIOGRAM COMPLETE
Height: 69 in
S' Lateral: 5.1 cm
Weight: 2261.04 oz

## 2022-01-25 LAB — TROPONIN I (HIGH SENSITIVITY)
Troponin I (High Sensitivity): 34 ng/L — ABNORMAL HIGH (ref <18)
Troponin I (High Sensitivity): 37 ng/L — ABNORMAL HIGH (ref <18)

## 2022-01-25 LAB — CBC
HCT: 32.8 % — ABNORMAL LOW (ref 39.0–52.0)
Hemoglobin: 11.6 g/dL — ABNORMAL LOW (ref 13.0–17.0)
MCH: 32.4 pg (ref 26.0–34.0)
MCHC: 35.4 g/dL (ref 30.0–36.0)
MCV: 91.6 fL (ref 80.0–100.0)
Platelets: 141 10*3/uL — ABNORMAL LOW (ref 150–400)
RBC: 3.58 MIL/uL — ABNORMAL LOW (ref 4.22–5.81)
RDW: 13.5 % (ref 11.5–15.5)
WBC: 6.2 10*3/uL (ref 4.0–10.5)
nRBC: 0 % (ref 0.0–0.2)

## 2022-01-25 LAB — MAGNESIUM: Magnesium: 2 mg/dL (ref 1.7–2.4)

## 2022-01-25 MED ORDER — FUROSEMIDE 10 MG/ML IJ SOLN: 40.0000 mg | Freq: Once | INTRAMUSCULAR | Status: DC

## 2022-01-25 MED ORDER — FUROSEMIDE 10 MG/ML IJ SOLN
40.0000 mg | Freq: Two times a day (BID) | INTRAMUSCULAR | Status: DC
  Administered 2022-01-25 – 2022-01-27 (×4): 40 mg via INTRAVENOUS
  Filled 2022-01-25 (×4): qty 4

## 2022-01-25 MED ORDER — ASPIRIN 81 MG PO TBEC
81.0000 mg | DELAYED_RELEASE_TABLET | Freq: Every day | ORAL | Status: DC
Start: 2022-01-25 — End: 2022-01-25

## 2022-01-25 NOTE — Progress Notes (Signed)
  Echocardiogram 2D Echocardiogram has been performed.  Johny Chess 01/25/2022, 10:59 AM

## 2022-01-25 NOTE — Evaluation (Signed)
Physical Therapy Evaluation Patient Details Name: [name] MRN: [id number] DOB: 1941/03/25 Today's Date: 01/25/2022  History of Present Illness  The pt is an 81 yo male presenting 7/15 with SOB and possible new onset afib. Upon work up, suspect multifocal atrial tachycardia complicated by persistent SOB with hypoxia. PMH includes: CAD s/p CABG, COPD, HLD, and DM II. CT cervical spine 2018: Severe degenerative disc disease at C6-7.    Clinical Impression  Pt in bed upon arrival of PT, agreeable to evaluation at this time. Prior to admission the pt was independent with intermittent use of SPC for mobility and is able to complete all ADLs as well as assisting his wife with ADLs and IADLs. The pt now presents with limitations in functional mobility, strength, power, stability, and activity tolerance due to above dx, and will continue to benefit from skilled PT to address these deficits. The pt was able to complete bed mobility with minA and increased time, but required up to modA to maintain static stance due to posterior lean. The pt benefits from increased support in addition to cues for postural correction. Session limited by arrival of breathing treatment for the patient, but at this time SpO2 to low of 88% on 2L with standing marches. Will continue to assess O2 needs with mobility progression as well as safety with use of DME. Will need 1-2 more acute visits prior to being safe to progress home with family support but will work towards pt goal of returning home.        Recommendations for follow up therapy are one component of a multi-disciplinary discharge planning process, led by the attending physician.  Recommendations may be updated based on patient status, additional functional criteria and insurance authorization.  Follow Up Recommendations Home health PT      Assistance Recommended at Discharge Frequent or constant Supervision/Assistance  Patient can return home with the  following  A little help with walking and/or transfers;A little help with bathing/dressing/bathroom;Assistance with cooking/housework;Assistance with feeding;Direct supervision/assist for medications management;Direct supervision/assist for financial management;Assist for transportation;Help with stairs or ramp for entrance    Equipment Recommendations  (plan to trial rollator, shower seat)  Recommendations for Other Services       Functional Status Assessment Patient has had a recent decline in their functional status and demonstrates the ability to make significant improvements in function in a reasonable and predictable amount of time.     Precautions / Restrictions Precautions Precautions: Fall Precaution Comments: watch SpO2 Restrictions Weight Bearing Restrictions: No      Mobility  Bed Mobility Overal bed mobility: Needs Assistance Bed Mobility: Supine to Sit, Sit to Supine     Supine to sit: Min assist Sit to supine: Min assist   General bed mobility comments: minA to complete transfer and correct LOB initially, pt able to reurn LE to bed, minA to manage trunk repositioning and boost in bed    Transfers Overall transfer level: Needs assistance Equipment used: 1 person hand held assist Transfers: Sit to/from Stand Sit to Stand: Min assist           General transfer comment: minA to rise, modA to steady in standing. posterior lean    Ambulation/Gait             Pre-gait activities: standing marches at EOB x5 each leg General Gait Details: deferred due to RN placing breathing tx, pt able to complete x5 standing marches at EOB with modA, posterior lean      Balance  Overall balance assessment: Needs assistance Sitting-balance support: Bilateral upper extremity supported, Feet supported Sitting balance-Leahy Scale: Fair Sitting balance - Comments: at times steady with minG, up to minA to correct LOB Postural control: Posterior lean Standing balance  support: Single extremity supported Standing balance-Leahy Scale: Fair Standing balance comment: posterior LOB in standing                             Pertinent Vitals/Pain Pain Assessment Pain Assessment: Faces Faces Pain Scale: Hurts a little bit Pain Location: generalized grimacing with movement Pain Descriptors / Indicators: Grimacing Pain Intervention(s): Limited activity within patient's tolerance, Monitored during session, Repositioned    Home Living Family/patient expects to be discharged to:: Private residence Living Arrangements: Spouse/significant other Available Help at Discharge: Family;Available PRN/intermittently Type of Home: House Home Access: Level entry       Home Layout: One level Home Equipment: Cane - single point Additional Comments: pt is main caregiver for his wife. States his son is there now helping her    Prior Function Prior Level of Function : Independent/Modified Independent;Driving;History of Falls (last six months)             Mobility Comments: pt states independent with intermittent use of SPC. typically assists wife with ADLs and mobility ADLs Comments: pt reports independence     Hand Dominance   Dominant Hand: Right    Extremity/Trunk Assessment   Upper Extremity Assessment Upper Extremity Assessment: Defer to OT evaluation    Lower Extremity Assessment Lower Extremity Assessment: Overall WFL for tasks assessed (grossly functional to MMT, poor power and stability but no instances of buckling)    Cervical / Trunk Assessment Cervical / Trunk Assessment: Kyphotic  Communication   Communication: No difficulties  Cognition Arousal/Alertness: Awake/alert Behavior During Therapy: WFL for tasks assessed/performed Overall Cognitive Status: Within Functional Limits for tasks assessed                                 General Comments: pt pleasant and agreeable, slightly decreased awareness with  positioning, needing cues for posture and to make balance corrections. Able to answer all questions appropriately        General Comments General comments (skin integrity, edema, etc.): SpO2 to low fo 88% after standing marches on 2L O2, RN present and placing breathing tx        Assessment/Plan    PT Assessment Patient needs continued PT services  PT Problem List Decreased strength;Decreased range of motion;Decreased activity tolerance;Decreased balance;Decreased mobility;Cardiopulmonary status limiting activity       PT Treatment Interventions DME instruction;Gait training;Stair training;Functional mobility training;Therapeutic activities;Therapeutic exercise;Balance training;Patient/family education    PT Goals (Current goals can be found in the Care Plan section)  Acute Rehab PT Goals Patient Stated Goal: return home PT Goal Formulation: With patient Time For Goal Achievement: 02/08/22 Potential to Achieve Goals: Good    Frequency Min 3X/week     Co-evaluation PT/OT/SLP Co-Evaluation/Treatment: Yes Reason for Co-Treatment: For patient/therapist safety;To address functional/ADL transfers PT goals addressed during session: Mobility/safety with mobility;Balance         AM-PAC PT "6 Clicks" Mobility  Outcome Measure Help needed turning from your back to your side while in a flat bed without using bedrails?: A Little Help needed moving from lying on your back to sitting on the side of a flat bed without using bedrails?: A Little Help  needed moving to and from a bed to a chair (including a wheelchair)?: A Little Help needed standing up from a chair using your arms (e.g., wheelchair or bedside chair)?: A Little Help needed to walk in hospital room?: Total (unable t owalk 20 ft at this time) Help needed climbing 3-5 steps with a railing? : Total 6 Click Score: 14    End of Session Equipment Utilized During Treatment: Oxygen Activity Tolerance: Patient tolerated treatment  well Patient left: in bed;with call bell/phone within reach;with bed alarm set;with nursing/sitter in room;with family/visitor present Nurse Communication: Mobility status PT Visit Diagnosis: Other abnormalities of gait and mobility (R26.89);Muscle weakness (generalized) (M62.81)    Time: 4765-4650 PT Time Calculation (min) (ACUTE ONLY): 16 min   Charges:   PT Evaluation $PT Eval Low Complexity: 1 Low          West Carbo, PT, DPT   Acute Rehabilitation Department  Sandra Cockayne 01/25/2022, 10:05 AM

## 2022-01-25 NOTE — Plan of Care (Signed)

## 2022-01-25 NOTE — Progress Notes (Addendum)
Subjective:   Hospital day 0.  Interval events: No acute events overnight.  [name] is not feeling well this morning.  Around 6 AM he began experiencing acute shortness of breath, even without exertion.  He denies worsening cough.  He denies chest pain, tightness, or pressure.  He has not had any more diarrhea this morning.  He had a restful night prior to onset of symptoms.  Objective:  Vital signs: Blood pressure 107/67, pulse 86, temperature 98 F (36.7 C), temperature source Oral, resp. rate (!) 26, height '5\' 9"'$  (1.753 m), weight 64.1 kg, SpO2 97 %.  Supplemental O2: [place] 2 L  Physical exam: Constitutional: Uncomfortable appearing male.  In mild distress. Cardiovascular: Irregular tachycardia. Pulmonary: Wheezing and crackles bilateral lower lobes. Abdominal: Soft and nontender. Skin: Warm and somewhat diaphoretic. Extremities: Right radial pulse 2+. Neuro: Alert and oriented.   Intake/Output Summary (Last 24 hours) at 01/25/2022 1144 Last data filed at 01/25/2022 1055 Gross per 24 hour  Intake 297.36 ml  Output 1475 ml  Net -1177.64 ml    Pertinent Labs:    Latest Ref Rng & Units 01/25/2022    3:12 AM 01/24/2022   12:29 PM 09/17/2021    8:36 AM  CBC  WBC 4.0 - 10.5 K/uL 6.2  6.7  5.4   Hemoglobin 13.0 - 17.0 g/dL 11.6  11.4  14.0   Hematocrit 39.0 - 52.0 % 32.8  33.4  40.7   Platelets 150 - 400 K/uL 141  151  140        Latest Ref Rng & Units 01/25/2022    3:12 AM 01/24/2022   12:29 PM 09/17/2021    8:36 AM  CMP  Glucose 70 - 99 mg/dL 147  145  135   BUN 8 - 23 mg/dL '12  13  10   '$ Creatinine 0.61 - 1.24 mg/dL 0.90  0.79  0.75   Sodium 135 - 145 mmol/L 136  137  136   Potassium 3.5 - 5.1 mmol/L 3.9  3.7  4.8   Chloride 98 - 111 mmol/L 101  103  100   CO2 22 - 32 mmol/L '25  22  26   '$ Calcium 8.9 - 10.3 mg/dL 8.9  9.1  9.7   Total Protein 6.0 - 8.5 g/dL   6.7   Total Bilirubin 0.0 - 1.2 mg/dL   0.7   Alkaline Phos 44 - 121 IU/L   41   AST 0  - 40 IU/L   20   ALT 0 - 44 IU/L   12    Component     Latest Ref Rng 01/24/2022 01/25/2022  Troponin I (High Sensitivity)     <18 ng/L 59 (H) 10:15 PM 34 (H) 9:39 AM  Troponin I (High Sensitivity)      57 (H) 8:16 PM    Magnesium: 2.0 mg/dL  Imaging: CXR with markedly worsened diffuse interstitial edema.  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 LVEF less than 20%.  EKG: Sinus tachycardia with frequent ectopic beats and increased T wave amplitude compared to previous EKG.  Assessment/Plan:   Principal Problem:   Acute exacerbation of CHF (congestive heart failure) (HCC) Active Problems:   Shortness of breath   Multifocal atrial tachycardia (HCC)   Acute respiratory failure with hypoxia [facility])   Patient Summary: [name] is a 81 y.o. with a PMH  of coronary artery disease, coronary artery bypass surgery, and chronic diastolic heart failure who was admitted yesterday for shortness of breath and tachycardia likely secondary to heart failure exacerbation, who this morning has worsening shortness of breath and increased pulmonary edema on chest x-ray.  Heart failure exacerbation likely secondary to severe mitral regurgitation Hypoxic respiratory failure Irregular sinus tachycardia [name] condition is worse today.  Echo findings include new severe MR, likely secondary to ruptured chordae tendon, and EF less than 20%.  Troponin still trending down from yesterday.  EKG shows nonspecific T wave changes compared to yesterday's EKG.  No chest pain.  Suspect sinus tachycardia is due to shortness of breath or hypoxia.  I will continue diuresis for heart failure exacerbation.  I will order an extra dose of Lasix for this evening.  Advised nursing staff to start as needed albuterol for wheezing and shortness of breath.  Will reassess after treatment.  Cardiology is following, appreciate their input.  Will likely need cath and TEE. -  Add one-time furosemide 40 mg IV to be administered once this evening for a total of 80 mg today. - Continue furosemide 40 mg IV daily. - Continue albuterol 2.5 mg nebulized every 4 hours as needed for wheezing and shortness of breath. - Continue monitoring on telemetry. - Continue strict I/O monitoring. - A.m. BMP, mag, Phos  Coronary artery disease Concern for worsening ischemic heart disease versus recent ACS With patient's extensive history of coronary artery disease including coronary artery bypass grafting and indolent course of progressive generalized weakness with sudden worsening of symptoms within the last week, I am concerned that his current clinical picture might be caused by ischemic heart disease or recent acute coronary event.  Echo findings support this hypothesis as well. - Follow-up echo - Continue Plavix 75 mg daily - Continue Zetia 10 mg daily - Continue Imdur 30 mg daily - We will DC metoprolol given acute heart failure exacerbation  Vitamin D deficiency - Continue cholecalciferol 5000 units daily  Diarrhea, resolved No diarrhea today.  ITP Platelets 141 today, consistent with baseline.  Transitions of care PT/OT following.  Appreciate their recommendations.  Diet: Normal IVF: None,None VTE: Enoxaparin Code: Full PT/OT recs: Home health. Family Update: yes.   Dispo: Anticipated discharge to Home in 2 days pending medical treatment and stabilization of acute medical problems.   Nani Gasser, MD 01/25/2022, 11:44 AM  Pager: [phone] After 5pm on weekdays and 1pm on weekends: On Call pager: [phone]

## 2022-01-25 NOTE — Consult Note (Addendum)
Cardiology Consultation:   Patient ID: [name] MRN: [id number]; DOB: [birth year]  Admit date: 01/24/2022 Date of Consult: 01/25/2022  PCP:  Lillard Anes, MD   [facility] Providers Cardiologist:  Mertie Moores, MD    Patient Profile:   [name] is a 81 y.o. male with a hx of CAD s/p CABG with subsequent PCI in 2007, HLD, DMII, and chronic diastolic heart failure who is being seen 01/25/2022 for the evaluation of newly diagnosed systolic heart failure and moderate-to-severe MR at the request of Dr. Dareen Piano.  History of Present Illness:   [name] is a 81 year old male with history of CAD with prior CABG in 2003 and PCI in 2007 who is followed by Dr. Acie Fredrickson as an outpatient. Was last seen in clinic on 11/11/21 where he was complaining of significant weakness. TTE from 11/2020 showed EF 50-55%, mild-to-moderate MR, normal PASP, RAP 68mHg.  He presented on this admission with progressive weakness and SOB. Last night, he states he became so short of breath when trying to ambulate that he was afraid he would not make it to his chair due to symptoms. This prompted him to come to the ER for further evaluation.   Here,  trop 57>59>34. BNP 1092. CXR with pleural effusions and pulmonary edema. TTE today with severely reduced LVEF<20% with global hypokinesis that appeared worse in the anterior, septal and apical segments. There was also at least moderate-to-severe MR with concern for a torn chordae with severe pulmonary HTN.   Currently, the patient states he feels better. Breathing is improved. Denies chest pain. Reports he has only had two brief episodes of sharp chest pain over the past month for which he took nitro. No significant LE edema.    Past Medical History:  Diagnosis Date   Atherosclerotic heart disease of native coronary artery without angina pectoris 02/12/2010   Qualifier: Diagnosis of  By: SMarland Mcalpine    Benign prostatic hyperplasia with  lower urinary tract symptoms 12/04/2019   CAD (coronary artery disease)    Hx of    Cellulitis 03/19/2012   [place] pulmonary disease [place] 12/04/2019   Diverticulosis of colon (without mention of hemorrhage) 2011   Colonoscopy    Hiatal hernia 2011   EGD   Hypertension    Hypertensive heart disease without heart failure 12/04/2019   ITP (idiopathic thrombocytopenic purpura)    He has been diagnosed with   Pancytopenia    [place], [place] 01/24/2013   TRANSAMINASES, SERUM, ELEVATED 02/12/2010   Qualifier: Diagnosis of  By: SMarland Mcalpine    Type 2 diabetes mellitus with diabetic peripheral angiopathy without gangrene [place] 12/04/2019    Past Surgical History:  Procedure Laterality Date   CARDIAC CATHETERIZATION     His last heart catheterization in May of 2009 reveals a patent LIMA   CORONARY ANGIOPLASTY     Successful percutaneous transluminal coronary angioplasty of the left circumflex obuse marginal vessel   CORONARY ANGIOPLASTY WITH STENT PLACEMENT     CORONARY ARTERY BYPASS GRAFT     x4 with a left internal mammary artery anastomosis to the left anterior descending coronary artery    CORONARY STENT PLACEMENT     successful percutaneous transluminal coronary angioplasty and stenting of the left main coronary artery   HERNIA REPAIR     SAPHENOUS VEIN GRAFT RESECTION     graft to the first obtuse marginal, a saphenous vein graft to the first diagonal coronary artery  and a saphenous vein graft to the distal right coronary artery   US ECHOCARDIOGRAPHY  02-08-2007   Est. EF 40-45%     Home Medications:  Prior to Admission medications   Medication Sig Start Date End Date Taking? Authorizing Provider  cholecalciferol (VITAMIN D3) 25 MCG (1000 UT) tablet Take 5,000 Units by mouth daily.   Yes [provider]  ezetimibe (ZETIA) 10 MG tablet TAKE 1 TABLET BY MOUTH DAILY 10/13/21  Yes Nahser, Wonda Cheng, MD  isosorbide mononitrate (IMDUR) 30 MG 24  hr tablet Take 1 tablet (30 mg total) by mouth daily. 01/14/22  Yes Nahser, Wonda Cheng, MD  lisinopril (ZESTRIL) 5 MG tablet TAKE 1 TABLET BY MOUTH DAILY 10/13/21  Yes Lillard Anes, MD  metFORMIN (GLUCOPHAGE) 500 MG tablet Take 500 mg by mouth in the morning, at noon, in the evening, and at bedtime.   Yes [provider]  metoprolol tartrate (LOPRESSOR) 25 MG tablet TAKE 1/2 TABLET BY MOUTH DAILY Patient taking differently: Take 12.5 mg by mouth every evening. 04/07/21  Yes Lillard Anes, MD  Multiple Vitamin (MULTIVITAMIN) tablet Take 1 tablet by mouth daily.   Yes [provider]  nitroGLYCERIN (NITROSTAT) 0.4 MG SL tablet Place 0.4 mg under the tongue every 5 (five) minutes as needed for chest pain.   Yes [provider]  Lancets MISC 1 each by Does not apply route daily as needed. Please provide lancets that fit patients lancet device 10/09/19   Lillard Anes, MD  [facility] ULTRA test strip AS DIRECTED 11/17/21   Lillard Anes, MD    Inpatient Medications: Scheduled Meds:  cholecalciferol  5,000 Units Oral Daily   clopidogrel  75 mg Oral Daily   enoxaparin (LOVENOX) injection  40 mg Subcutaneous Q24H   ezetimibe  10 mg Oral Daily   furosemide  40 mg Intravenous Daily   furosemide  40 mg Intravenous Once   isosorbide mononitrate  30 mg Oral Daily   metoprolol succinate  25 mg Oral Daily   multivitamin with minerals  1 tablet Oral Daily   Continuous Infusions:  PRN Meds: acetaminophen **OR** acetaminophen, albuterol, ondansetron **OR** ondansetron (ZOFRAN) IV, senna-docusate  Allergies:    Allergies  Allergen Reactions   Lipitor [Atorvastatin Calcium] Other (See Comments)    Causes memory loss   Crestor [Rosuvastatin Calcium] Other (See Comments)    Causes Muscle Pain   Zocor [Simvastatin] Other (See Comments)    Causes muscle pain    Social History:   Social History   Socioeconomic History   Marital status: Married     Spouse name: Not on file   Number of children: 3   Years of education: Not on file   Highest education level: Not on file  Occupational History   Occupation: Retired   Tobacco Use   Smoking status: Never   Smokeless tobacco: Never  Vaping Use   Vaping Use: Never used  Substance and Sexual Activity   Alcohol use: No   Drug use: No   Sexual activity: Not Currently  Other Topics Concern   Not on file  Social History Narrative   Daily caffeine use    Social Determinants of Health   Financial Resource Strain: Not on file  Food Insecurity: Not on file  Transportation Needs: Not on file  Physical Activity: Not on file  Stress: Not on file  Social Connections: Not on file  Intimate Partner Violence: Not on file    Family History:  Family History  Problem Relation Age of Onset   Cerebrovascular Accident Mother    Colon cancer Neg Hx      ROS:  Please see the history of present illness.  All other ROS reviewed and negative.     Physical Exam/Data:   Vitals:   01/25/22 0944 01/25/22 1000 01/25/22 1041 01/25/22 1054  BP: (!) 143/95 107/67 107/67   Pulse: 86 83 83 86  Resp: (!) 23 (!) 26 (!) 26   Temp: 98 F (36.7 C) 98 F (36.7 C) 98 F (36.7 C) 98 F (36.7 C)  TempSrc:    Oral  SpO2:      Weight:      Height:        Intake/Output Summary (Last 24 hours) at 01/25/2022 1200 Last data filed at 01/25/2022 1055 Gross per 24 hour  Intake 297.36 ml  Output 1475 ml  Net -1177.64 ml      01/25/2022    6:36 AM 01/24/2022    9:29 PM 01/24/2022   11:21 AM  Last 3 Weights  Weight (lbs) 141 lb 5 oz 142 lb 1.6 oz 146 lb  Weight (kg) 64.1 kg 64.456 kg 66.225 kg     Body mass index is 20.87 kg/m.  General:  Comfortable, sitting up in bed, speaking in full sentences HEENT: normal Neck: JVD to angle of the mandible Vascular: No carotid bruits; Distal pulses 2+ bilaterally Cardiac:  Irregular, 2/6 systolic murmur Lungs:  Diminished about 1/3 up the lung fields Abd:  soft, nontender, no hepatomegaly  Ext: trace pitting edema Musculoskeletal:  No deformities, BUE and BLE strength normal and equal Skin: warm and dry  Neuro:  CNs 2-12 intact, no focal abnormalities noted Psych:  Normal affect   EKG:  The EKG was personally reviewed and demonstrates:  NSR, PACs, nonspecific ST-T wave changes Telemetry:  Telemetry was personally reviewed and demonstrates:  NSR with PACs  Relevant CV Studies: TTE 01/25/22: IMPRESSIONS     1. TTE with newly reduced LVEF of <20%, multiple wall motion  abnormalities and at least moderate-to-severe MR with concern for a torn  chordae. Recommend cath and TEE for further evaluation.   2. Left ventricular ejection fraction, by estimation, is <20%. The left  ventricle has severely decreased function. The left ventricle demonstrates  regional wall motion abnormalities (see scoring diagram/findings for  description). There is severe global  hypokinesis which appears worse in the anterior, septal and apical  segments. Left ventricular diastolic parameters are consistent with Grade  II diastolic dysfunction (pseudonormalization). Recommend cath for further  work-up.   3. There is a highly mobile, echobright density noted in the LV in the  subvalvular apparatus most concerning for a torn chordae given degree of  mitral regurgitation that has significantly progressed from prior study.  Recommend TEE for further  evaluation.   4. Right ventricular systolic function is mildly reduced. The right  ventricular size is normal. There is severely elevated pulmonary artery  systolic pressure. The estimated right ventricular systolic pressure is  74.0 mmHg.   5. Left atrial size was moderately dilated.   6. The mitral valve is abnormal. Supect there is a torn chordae with at  least moderate-to-severe mitral regurgitation. By color flow doppler, the  degree of mitral regurgitation appears more moderate-to-severe, however,  there is  systolic flow reversal in   the pulmonary vein which is more suggestive of severe mitral  regurgitation. Recommend TEE for further evaluation as above.   7. Tricuspid  valve regurgitation is mild to moderate.   8. The aortic valve is tricuspid. There is mild calcification of the  aortic valve. There is mild thickening of the aortic valve. Aortic valve  regurgitation is not visualized. Aortic valve sclerosis/calcification is  present, without any evidence of  aortic stenosis.   9. Aortic dilatation noted. There is borderline dilatation of the  ascending aorta, measuring 38 mm.  10. The inferior vena cava is normal in size with greater than 50%  respiratory variability, suggesting right atrial pressure of 3 mmHg.   Comparison(s): Compared to prior TTE on 12/16/20, the LVEF is now severely  reduced (previously 50-55%) with wall motion abnormalities as described  above. There is now at least moderate-to-severe MR with concern for a torn  chordae. Recommend cath and TEE  for further work-up.   Laboratory Data:  High Sensitivity Troponin:   Recent Labs  Lab 01/24/22 2016 01/24/22 2215 01/25/22 0939  TROPONINIHS 57* 59* 34*     Chemistry Recent Labs  Lab 01/24/22 1229 01/25/22 0312  NA 137 136  K 3.7 3.9  CL 103 101  CO2 22 25  GLUCOSE 145* 147*  BUN 13 12  CREATININE 0.79 0.90  CALCIUM 9.1 8.9  MG 1.5* 2.0  GFRNONAA >60 >60  ANIONGAP 12 10    No results for input(s): "PROT", "ALBUMIN", "AST", "ALT", "ALKPHOS", "BILITOT" in the last 168 hours. Lipids No results for input(s): "CHOL", "TRIG", "HDL", "LABVLDL", "LDLCALC", "CHOLHDL" in the last 168 hours.  Hematology Recent Labs  Lab 01/24/22 1229 01/25/22 0312  WBC 6.7 6.2  RBC 3.57* 3.58*  HGB 11.4* 11.6*  HCT 33.4* 32.8*  MCV 93.6 91.6  MCH 31.9 32.4  MCHC 34.1 35.4  RDW 13.4 13.5  PLT 151 141*   Thyroid  Recent Labs  Lab 01/24/22 2016  TSH 2.684    BNP Recent Labs  Lab 01/24/22 1229  BNP 1,092.3*     DDimer No results for input(s): "DDIMER" in the last 168 hours.   Radiology/Studies:  ECHOCARDIOGRAM COMPLETE  Result Date: 01/25/2022    ECHOCARDIOGRAM REPORT   Patient Name:   [name] [facility] Date of Exam: 01/25/2022 Medical Rec #:  [id number]        Height:       69.0 in Accession #:    [id number]       Weight:       141.3 lb Date of Birth:  [birth year]        BSA:          1.782 m Patient Age:    68 years         BP:           143/95 mmHg Patient Gender: M                HR:           87 bpm. Exam Location:  Inpatient Procedure: 2D Echo Indications:    dyspnea  History:        Patient has prior history of Echocardiogram examinations, most                 recent 12/16/2020. CHF, CAD, COPD, Arrythmias:PAC, PVC and Atrial                 Fibrillation; Risk Factors:Dyslipidemia, Hypertension and                 Diabetes.  Sonographer:    Johny Chess RDCS Referring Phys: [phone]  NISCHAL NARENDRA IMPRESSIONS  1. TTE with newly reduced LVEF of <20%, multiple wall motion abnormalities and at least moderate-to-severe MR with concern for a torn chordae. Recommend cath and TEE for further evaluation.  2. Left ventricular ejection fraction, by estimation, is <20%. The left ventricle has severely decreased function. The left ventricle demonstrates regional wall motion abnormalities (see scoring diagram/findings for description). There is severe global hypokinesis which appears worse in the anterior, septal and apical segments. Left ventricular diastolic parameters are consistent with Grade II diastolic dysfunction (pseudonormalization). Recommend cath for further work-up.  3. There is a highly mobile, echobright density noted in the LV in the subvalvular apparatus most concerning for a torn chordae given degree of mitral regurgitation that has significantly progressed from prior study. Recommend TEE for further evaluation.  4. Right ventricular systolic function is mildly reduced. The right ventricular size is  normal. There is severely elevated pulmonary artery systolic pressure. The estimated right ventricular systolic pressure is 03.5 mmHg.  5. Left atrial size was moderately dilated.  6. The mitral valve is abnormal. Supect there is a torn chordae with at least moderate-to-severe mitral regurgitation. By color flow doppler, the degree of mitral regurgitation appears more moderate-to-severe, however, there is systolic flow reversal in  the pulmonary vein which is more suggestive of severe mitral regurgitation. Recommend TEE for further evaluation as above.  7. Tricuspid valve regurgitation is mild to moderate.  8. The aortic valve is tricuspid. There is mild calcification of the aortic valve. There is mild thickening of the aortic valve. Aortic valve regurgitation is not visualized. Aortic valve sclerosis/calcification is present, without any evidence of aortic stenosis.  9. Aortic dilatation noted. There is borderline dilatation of the ascending aorta, measuring 38 mm. 10. The inferior vena cava is normal in size with greater than 50% respiratory variability, suggesting right atrial pressure of 3 mmHg. Comparison(s): Compared to prior TTE on 12/16/20, the LVEF is now severely reduced (previously 50-55%) with wall motion abnormalities as described above. There is now at least moderate-to-severe MR with concern for a torn chordae. Recommend cath and TEE for further work-up. FINDINGS  Left Ventricle: There is a highly mobile, echobright density noted in the LV in the subvalvular apparatus that is most concerning for a torn chordae given degree of mitral valve regurgitation. Recommend TEE for further evaluation. Left ventricular ejection fraction, by estimation, is <20%. The left ventricle has severely decreased function. The left ventricle demonstrates regional wall motion abnormalities. The left ventricular internal cavity size was normal in size. There is no left ventricular hypertrophy. Left ventricular diastolic  parameters are consistent with Grade II diastolic dysfunction (pseudonormalization). Right Ventricle: The right ventricular size is normal. No increase in right ventricular wall thickness. Right ventricular systolic function is mildly reduced. There is severely elevated pulmonary artery systolic pressure. The tricuspid regurgitant velocity is 4.40 m/s, and with an assumed right atrial pressure of 3 mmHg, the estimated right ventricular systolic pressure is 46.5 mmHg. Left Atrium: Left atrial size was moderately dilated. Right Atrium: Right atrial size was normal in size. Pericardium: There is no evidence of pericardial effusion. Mitral Valve: The mitral valve is abnormal. There is mild thickening of the mitral valve leaflet(s). There is mild calcification of the mitral valve leaflet(s). Mild mitral annular calcification. Moderate to severe mitral valve regurgitation. Tricuspid Valve: The tricuspid valve is normal in structure. Tricuspid valve regurgitation is mild to moderate. Aortic Valve: The aortic valve is tricuspid. There is mild calcification of the aortic valve.  There is mild thickening of the aortic valve. Aortic valve regurgitation is not visualized. Aortic valve sclerosis/calcification is present, without any evidence of aortic stenosis. Pulmonic Valve: The pulmonic valve was normal in structure. Pulmonic valve regurgitation is trivial. Aorta: Aortic dilatation noted. There is borderline dilatation of the ascending aorta, measuring 38 mm. Venous: The inferior vena cava is normal in size with greater than 50% respiratory variability, suggesting right atrial pressure of 3 mmHg. IAS/Shunts: The atrial septum is grossly normal.  LEFT VENTRICLE PLAX 2D LVIDd:         5.60 cm LVIDs:         5.10 cm LV PW:         1.00 cm LV IVS:        1.00 cm LVOT diam:     2.50 cm LV SV:         68 LV SV Index:   38 LVOT Area:     4.91 cm  RIGHT VENTRICLE         IVC TAPSE (M-mode): 1.6 cm  IVC diam: 1.80 cm LEFT ATRIUM              Index        RIGHT ATRIUM           Index LA diam:        5.00 cm 2.81 cm/m   RA Area:     15.80 cm LA Vol (A2C):   80.2 ml 44.99 ml/m  RA Volume:   42.20 ml  23.67 ml/m LA Vol (A4C):   76.1 ml 42.69 ml/m LA Biplane Vol: 80.6 ml 45.22 ml/m  AORTIC VALVE LVOT Vmax:   74.50 cm/s LVOT Vmean:  48.000 cm/s LVOT VTI:    0.138 m  AORTA Ao Root diam: 3.40 cm Ao Asc diam:  3.80 cm TRICUSPID VALVE TR Peak grad:   77.4 mmHg TR Vmax:        440.00 cm/s  SHUNTS Systemic VTI:  0.14 m Systemic Diam: 2.50 cm Gwyndolyn Kaufman MD Electronically signed by Gwyndolyn Kaufman MD Signature Date/Time: 01/25/2022/11:52:10 AM    Final    DG CHEST PORT 1 VIEW  Result Date: 01/25/2022 CLINICAL DATA:  Dyspnea on exertion.  Coronary artery disease. EXAM: PORTABLE CHEST 1 VIEW COMPARISON:  Two-view chest x-ray 01/24/2022 FINDINGS: The heart is enlarged. Atherosclerotic changes are present at the aortic arch. Marked increase in diffuse interstitial edema is present. Bilateral pleural effusions and basilar airspace disease have increased as well. IMPRESSION: 1. Cardiomegaly with increasing interstitial edema and bilateral pleural effusions compatible with congestive heart failure. 2. Bibasilar airspace disease likely reflects atelectasis. Electronically Signed   By: San Morelle M.D.   On: 01/25/2022 10:29   DG Chest 2 View  Result Date: 01/24/2022 CLINICAL DATA:  Acute shortness of breath. EXAM: CHEST - 2 VIEW COMPARISON:  12/27/2009 chest CT FINDINGS: Cardiomegaly and CABG changes noted. Pulmonary vascular congestion and mild interstitial opacities are present likely representing mild interstitial edema. Small bilateral pleural effusions and bibasilar opacities/atelectasis noted. There is no evidence of pneumothorax or acute bony abnormality. IMPRESSION: Cardiomegaly with mild interstitial pulmonary edema, small bilateral pleural effusions and bibasilar-favor atelectasis. Electronically Signed   By: Margarette Canada M.D.    On: 01/24/2022 12:36     Assessment and Plan:   #Newly Diagnosed Acute Combined Systolic and Diastolic HF: Patient presented with progressive weakness and SOB found to have volume overload on exam with BNP 1092. TTE with LVEF <20% with severe global  hypokinesis that appears worse in anterior, septal and apical segments as well as at least moderate-to-severe MR with concern for a torn chordae. Fortunately, he is HD stable and comfortable on exam . Will continue IV diuresis and plan for RHC/LHC tomorrow pending volume status followed by TEE later in the week to assess mitral valve. Plan discussed at length with family and patient at bedside. -Plan for RHC/LHC once more clinically improved from volume standpoint -Increase lasix to '40mg'$  IV BID -Continue imdur '30mg'$  daily -Hold metop for now given degree of reduced EF  #CAD s/p CABG: Now with drop in EF to <20% with WMA concerning for LAD/LIMA disease. Trop flat on admission and ECG without acute ischemic changes. Will plan for RHC/LHC once more clinically euvolemic. -Plan for RHC/LHC once more euvolemic -Continue plavix '75mg'$  daily -Did not tolerate statins; will continue zetia '10mg'$  daily  #Moderate-to-severe MR: #Concern for torn chordae TTE with progressive worsening of MR from prior with concern for torn chordae. Will plan for LHC/RHC and TEE for further evaluation once more euvolemic. -Continue diuresis as above -Plan for RHC/LHC and TEE once more euvolemic  #HLD: #Statin Intolerance: -Continue zetia '10mg'$  daily as above  INFORMED CONSENT: I have reviewed the risks, indications, and alternatives to cardiac catheterization, possible angioplasty, and stenting with the patient. Risks include but are not limited to bleeding, infection, vascular injury, stroke, myocardial infection, arrhythmia, kidney injury, radiation-related injury in the case of prolonged fluoroscopy use, emergency cardiac surgery, and death. The patient understands the risks  of serious complication is 1-2 in 9628 with diagnostic cardiac cath and 1-2% or less with angioplasty/stenting.     Risk Assessment/Risk Scores:      New York Heart Association (NYHA) Functional Class NYHA Class IV        For questions or updates, please contact [facility] Please consult [email or web address] for contact info under    Signed, Freada Bergeron, MD  01/25/2022 12:00 PM

## 2022-01-25 NOTE — Evaluation (Signed)
Clinical/Bedside Swallow Evaluation Patient Details  Name: [name] MRN: [id number] Date of Birth: Jan 10, 1941  Today's Date: 01/25/2022 Time: SLP Start Time (ACUTE ONLY): 59 SLP Stop Time (ACUTE ONLY): 0940 SLP Time Calculation (min) (ACUTE ONLY): 27 min  Past Medical History:  Past Medical History:  Diagnosis Date   Atherosclerotic heart disease of native coronary artery without angina pectoris 02/12/2010   Qualifier: Diagnosis of  By: Marland Mcalpine     Benign prostatic hyperplasia with lower urinary tract symptoms 12/04/2019   CAD (coronary artery disease)    Hx of    Cellulitis 03/19/2012   Chronic obstructive pulmonary disease ([place]) 12/04/2019   Diverticulosis of colon (without mention of hemorrhage) 2011   Colonoscopy    Hiatal hernia 2011   EGD   Hypertension    Hypertensive heart disease without heart failure 12/04/2019   ITP (idiopathic thrombocytopenic purpura)    He has been diagnosed with   Pancytopenia    Thrombocytopenia, unspecified ([place]) 01/24/2013   TRANSAMINASES, SERUM, ELEVATED 02/12/2010   Qualifier: Diagnosis of  By: Marland Mcalpine     Type 2 diabetes mellitus with diabetic peripheral angiopathy without gangrene ([place]) 12/04/2019   Past Surgical History:  Past Surgical History:  Procedure Laterality Date   CARDIAC CATHETERIZATION     His last heart catheterization in May of 2009 reveals a patent LIMA   CORONARY ANGIOPLASTY     Successful percutaneous transluminal coronary angioplasty of the left circumflex obuse marginal vessel   CORONARY ANGIOPLASTY WITH STENT PLACEMENT     CORONARY ARTERY BYPASS GRAFT     x4 with a left internal mammary artery anastomosis to the left anterior descending coronary artery    CORONARY STENT PLACEMENT     successful percutaneous transluminal coronary angioplasty and stenting of the left main coronary artery   HERNIA REPAIR     SAPHENOUS VEIN GRAFT RESECTION     graft to the first obtuse marginal, a saphenous vein  graft to the first diagonal coronary artery  and a saphenous vein graft to the distal right coronary artery   US ECHOCARDIOGRAPHY  02-08-2007   Est. EF 40-45%   HPI:  The pt is an 81 yo male presenting 7/15 with SOB and possible new onset afib. Upon work up, suspect multifocal atrial tachycardia. PMH includes: CAD s/p CABG, COPD, HLD, and DM II. CT cervical spine 2018: Severe degenerative disc disease at C6-7. Had a significant coughing event with breakfast this am, hence SLP swallow consult was ordered.    Assessment / Plan / Recommendation  Clinical Impression  Pt presents with functional oropharyngeal swallow.  Oral mechanism exam is normal; upper/lower dentures present. Pt presented with thorough mastication of solids, the appearance of a brisk swallow response, and no s/s of aspiration. He was taxed with successive swallows of thin and dual consistencies without exhibiting s/s of dysphagia.  He reports occasional coughing with meals.  Recommend continuing current diet (reg/thin); pt agrees to take his time with meals and being careful when eating foods that have a liqud component (juicy fruits, cereals, brothy soup).  Try meds whole in puree and following with thin liquids. Pt and son at bedside verbalize understanding. No SLP f/u needed - our service will sign off. SLP Visit Diagnosis: Dysphagia, unspecified (R13.10)    Aspiration Risk  No limitations    Diet Recommendation   Regular solids, thin liquids  Medication Administration: Whole meds with puree    Other  Recommendations Oral Care Recommendations: Oral  care BID    Recommendations for follow up therapy are one component of a multi-disciplinary discharge planning process, led by the attending physician.  Recommendations may be updated based on patient status, additional functional criteria and insurance authorization.  Follow up Recommendations No SLP follow up        [place] Date of Onset: 01/24/22 HPI: The  pt is an 81 yo male presenting 7/15 with SOB and possible new onset afib. Upon work up, suspect multifocal atrial tachycardia. PMH includes: CAD s/p CABG, COPD, HLD, and DM II. CT cervical spine 2018: Severe degenerative disc disease at C6-7. Had a significant coughing event with breakfast this am, hence SLP swallow consult was ordered. Type of Study: Bedside Swallow Evaluation Previous Swallow Assessment: no Diet Prior to this Study: Regular;Thin liquids Temperature Spikes Noted: No Respiratory Status: Nasal cannula History of Recent Intubation: No Behavior/Cognition: Alert;Cooperative Oral Cavity Assessment: Within Functional Limits Oral Care Completed by SLP: No Oral Cavity - Dentition: Dentures, top;Dentures, bottom Vision: Functional for self-feeding Self-Feeding Abilities: Able to feed self Patient Positioning: Upright in bed Baseline Vocal Quality: Normal Volitional Cough: Strong Volitional Swallow: Able to elicit    Oral/Motor/Sensory Function Overall Oral Motor/Sensory Function: Within functional limits   Ice Chips Ice chips: Within functional limits   Thin Liquid Thin Liquid: Within functional limits    Nectar Thick Nectar Thick Liquid: Not tested   Honey Thick Honey Thick Liquid: Not tested   Puree Puree: Within functional limits   Solid     Solid: Within functional limits      Juan Quam Laurice 01/25/2022,9:52 AM Estill Bamberg L. Tivis Ringer, MA CCC/SLP Clinical Specialist - [place] Office number [phone]

## 2022-01-25 NOTE — Progress Notes (Signed)
Internal Medicine Attending:   I saw and examined the patient. I reviewed the resident's note and I agree with the resident's findings and plan as documented in the resident's note.  In brief, patient is a 81 year old male with a past medical history of CAD, BPH, COPD, hypertension, ITP who presented to the ED with progressively worsening shortness of breath over the last week.  Patient states that he has been doing more activity this week than is normal for him and has been working outside more.  However, he noticed that he was getting short of breath and his dyspnea has progressively worsened and now he gets dyspneic on minimal exertion.  He came to the ED for further evaluation and was noted to be tachycardic (initially thought to be A-fib) and was admitted for further evaluation.  On exam, patient was noted to be in moderate distress secondary to shortness of breath.  Patient was tachypneic and diaphoretic.  On cardiovascular exam patient was noted to be tachycardic and irregular.  Lungs revealed bibasilar crackles and diffuse expiratory wheeze.  Abdomen was soft, nontender, nondistended with normoactive bowel sounds.  Skin was diaphoretic.  Extremities with no lower extremity edema noted and they were nontender to palpation.  Patient was alert and oriented x3.  Patient with acute systolic heart failure secondary to severe MR.  Repeat chest x-ray done today showed worsening bilateral pulmonary edema and effusions consistent with a heart failure exacerbation.  2D echo done showed an EF of less than 20% with severe MR and a likely ruptured chordae tendon.  I suspect patient likely had an ischemic event earlier this week and has been having progressive shortness of breath secondary to severe MR.  Case was discussed with cardiology who will likely cath the patient and do a TEE.  We will continue with IV diuresis for now with Lasix 40 mg IV twice daily.  Will DC beta-blocker given acute heart failure  exacerbation.  Continue with albuterol as needed.  We will continue to monitor on telemetry.  Would transfer patient to progressive care unit.  Patient will need close monitoring with low threshold to transfer to ICU if needed.  Patient does have a history of CAD and was noted to have mildly elevated troponins in the 50s on admission.  Repeat troponins done today were improved in the 30s.  Repeat EKG done today showed sinus tachycardia with frequent APCs.  I do suspect patient likely had an acute ischemic event earlier this week and this troponins are downtrending now.  We will follow-up cath and TEE.

## 2022-01-25 NOTE — Progress Notes (Addendum)
Occupational Therapy Evaluation Patient Details Name: [name] MRN: [id number] DOB: Dec 23, 1940 Today's Date: 01/25/2022   History of Present Illness The pt is an 81 yo male presenting 7/15 with SOB and possible new onset afib. Upon work up, suspect multifocal atrial tachycardia complicated by persistent SOB with hypoxia. PMH includes: CAD s/p CABG, COPD, HLD, and DM II. CT cervical spine 2018: Severe degenerative disc disease at C6-7   Clinical Impression   Pt seen with PT services to maximize safety and tolerance of pt; pt pleasant and agreeable endorsing recently transitioning to use of SPC for mobility, as well as endorsing increased frequency of falls as of late. He states typically is is mod indep with ADLs and provides assist/support to wife as needed. Son in room. Pt currently presents with decreased activity tolerance, strength and mild balance deficits leading to need for min a for transfers and ADLs. Session limited by reports of persistent SOB with SpO2 levels dropping at one point to 88 on 2L. RN in room prepping breathing treatment. OT will continue to follow acutely, but anticipate that with continued participation, and OOB activity, pt may be able to progress to d/c to home with [facility] services and appropriate medical equip TBD at following sessions. Pt will require increased S/A from family if available.      Recommendations for follow up therapy are one component of a multi-disciplinary discharge planning process, led by the attending physician.  Recommendations may be updated based on patient status, additional functional criteria and insurance authorization.   Follow Up Recommendations  Home health OT    Assistance Recommended at Discharge Frequent or constant Supervision/Assistance  Patient can return home with the following A little help with walking and/or transfers;A little help with bathing/dressing/bathroom    Functional Status Assessment  Patient has had a recent  decline in their functional status and demonstrates the ability to make significant improvements in function in a reasonable and predictable amount of time.  Equipment Recommendations  Tub/shower bench    Recommendations for Other Services       Precautions / Restrictions Precautions Precautions: Fall Precaution Comments: monitoring HR and SpO2, on Restrictions Weight Bearing Restrictions: No      Mobility Bed Mobility Overal bed mobility: Needs Assistance Bed Mobility: Supine to Sit, Sit to Supine     Supine to sit: Min assist Sit to supine: Min assist   General bed mobility comments: Min A for completion of trunk transition to upright    Transfers Overall transfer level: Needs assistance Equipment used: 1 person hand held assist Transfers: Sit to/from Stand Sit to Stand: Min assist           General transfer comment: Min A with cues for anterior weight shift, min A for lateral stepping up to mod d/t posterior LOB      Balance Overall balance assessment: Needs assistance           Standing balance-Leahy Scale: Fair Standing balance comment: persisent posterior LOB, with difficulty righting without cues                           ADL either performed or assessed with clinical judgement   ADL Overall ADL's : Needs assistance/impaired Eating/Feeding: Set up;Bed level   Grooming: Sitting;Set up Grooming Details (indicate cue type and reason): Eob washing face and hands         Upper Body Dressing : Minimal assistance;Sitting Upper Body Dressing Details (indicate cue  type and reason): donn/doff gown limited by liens Lower Body Dressing: Minimal assistance;Sitting/lateral leans;Sit to/from stand Lower Body Dressing Details (indicate cue type and reason): min A for simulated doning socks at EOB with slight LOB and difficulty with recovery, in standing without DME pt requiring min-mod simulated for clothing management in standing with persistent  posterior lean Toilet Transfer: Minimal assistance Toilet Transfer Details (indicate cue type and reason): lateral stepping for simulated, hand held assist-min A with cue for weight shift and balance         Functional mobility during ADLs: Minimal assistance General ADL Comments: assessment completed without DME pt presenting with persistent posterior LOB, demoing increased A for safety.     Vision Baseline Vision/History: 1 Wears glasses Ability to See in Adequate Light: 0 Adequate       Perception     Praxis      Pertinent Vitals/Pain Pain Assessment Pain Assessment: Faces Faces Pain Scale: Hurts a little bit Pain Location: generalized grimacing with movement Pain Descriptors / Indicators: Grimacing Pain Intervention(s): Limited activity within patient's tolerance     Hand Dominance Right   Extremity/Trunk Assessment Upper Extremity Assessment Upper Extremity Assessment: Overall WFL for tasks assessed   Lower Extremity Assessment Lower Extremity Assessment: Overall WFL for tasks assessed   Cervical / Trunk Assessment Cervical / Trunk Assessment: Kyphotic   Communication Communication Communication: No difficulties   Cognition Arousal/Alertness: Awake/alert Behavior During Therapy: WFL for tasks assessed/performed Overall Cognitive Status: Within Functional Limits for tasks assessed                                 General Comments: pt overall with good participation, demo's decreased awareness of LOB requiring cues for correction     General Comments  noted decreased SpO2 with activity down to 88% on 2L requiring up to 1 minutes to recover to 90%. RN in room    Exercises     Shoulder [place] expects to be discharged to:: Private residence Living Arrangements: Spouse/significant other Available Help at Discharge: Family;Available PRN/intermittently Type of Home: House Home Access: Level entry      Home Layout: One level               Home Equipment: Cane - single point   Additional Comments: pt reports providing assist for wife ("we help eachother") and using SPC occasionally      Prior Functioning/Environment Prior Level of Function : Independent/Modified Independent;Driving;History of Falls (last six months)             Mobility Comments: pt states independent with intermittent use of SPC. typically assists wife with ADLs and mobility ADLs Comments: pt reports independence        OT Problem List: Decreased strength;Decreased activity tolerance;Impaired balance (sitting and/or standing);Decreased safety awareness;Decreased knowledge of use of DME or AE;Cardiopulmonary status limiting activity      OT Treatment/Interventions: Self-care/ADL training;Therapeutic exercise;Neuromuscular education;Energy conservation;DME and/or AE instruction;Therapeutic activities;Balance training;Patient/family education    OT Goals(Current goals can be found in the care plan section) Acute Rehab OT Goals Patient Stated Goal: to be able to get up and move around OT Goal Formulation: With patient Time For Goal Achievement: 02/08/22 Potential to Achieve Goals: Fair ADL Goals Pt Will Perform Upper Body Dressing: with set-up Pt Will Perform Lower Body Dressing: with set-up;sit to/from stand Pt Will Transfer to Toilet: ambulating;with supervision Pt Will Perform  Toileting - Clothing Manipulation and hygiene: with supervision;sit to/from stand  OT Frequency: Min 2X/week    Co-evaluation PT/OT/SLP Co-Evaluation/Treatment: Yes Reason for Co-Treatment: For patient/therapist safety PT goals addressed during session: Mobility/safety with mobility;Balance        AM-PAC OT "6 Clicks" Daily Activity     Outcome Measure Help from another person eating meals?: None Help from another person taking care of personal grooming?: A Little Help from another person toileting, which includes using  toliet, bedpan, or urinal?: A Little Help from another person bathing (including washing, rinsing, drying)?: A Lot Help from another person to put on and taking off regular upper body clothing?: A Lot Help from another person to put on and taking off regular lower body clothing?: A Little 6 Click Score: 17   End of Session Equipment Utilized During Treatment: Oxygen Nurse Communication: Mobility status  Activity Tolerance: Patient limited by fatigue Patient left: in bed;with bed alarm set;with call bell/phone within reach;with family/visitor present;with nursing/sitter in room  OT Visit Diagnosis: Unsteadiness on feet (R26.81);Muscle weakness (generalized) (M62.81)                Time: 4742-5956 OT Time Calculation (min): 19 min Charges:  OT General Charges $OT Visit: 1 Visit OT Evaluation $OT Eval Low Complexity: 1 Low  Saniah Schroeter OTR/L acute rehab services Office: [phone]  Toula Moos Elizibeth Breau 01/25/2022, 11:19 AM

## 2022-01-25 NOTE — Progress Notes (Deleted)
Cannot do accurate echo at this time due to high heart rate.

## 2022-01-26 ENCOUNTER — Encounter (HOSPITAL_COMMUNITY): Payer: Self-pay | Admitting: Internal Medicine

## 2022-01-26 ENCOUNTER — Telehealth (HOSPITAL_COMMUNITY): Payer: Self-pay

## 2022-01-26 ENCOUNTER — Encounter (HOSPITAL_COMMUNITY)
Admission: EM | Disposition: A | Discharge status: Home Health | Payer: Self-pay | Source: Home / Self Care | Attending: Student in an Organized Health Care Education/Training Program

## 2022-01-26 ENCOUNTER — Other Ambulatory Visit (HOSPITAL_COMMUNITY): Payer: Self-pay

## 2022-01-26 ENCOUNTER — Ambulatory Visit: Payer: Medicare HMO | Admitting: Podiatry

## 2022-01-26 DIAGNOSIS — I340 Nonrheumatic mitral (valve) insufficiency: Secondary | ICD-10-CM

## 2022-01-26 DIAGNOSIS — I5043 Acute on chronic combined systolic (congestive) and diastolic (congestive) heart failure: Secondary | ICD-10-CM | POA: Diagnosis not present

## 2022-01-26 DIAGNOSIS — I25810 Atherosclerosis of coronary artery bypass graft(s) without angina pectoris: Secondary | ICD-10-CM

## 2022-01-26 DIAGNOSIS — I2510 Atherosclerotic heart disease of native coronary artery without angina pectoris: Secondary | ICD-10-CM | POA: Diagnosis not present

## 2022-01-26 HISTORY — PX: RIGHT/LEFT HEART CATH AND CORONARY/GRAFT ANGIOGRAPHY: CATH118267

## 2022-01-26 LAB — BASIC METABOLIC PANEL
Anion gap: 7 (ref 5–15)
BUN: 13 mg/dL (ref 8–23)
CO2: 29 mmol/L (ref 22–32)
Calcium: 8.8 mg/dL — ABNORMAL LOW (ref 8.9–10.3)
Chloride: 99 mmol/L (ref 98–111)
Creatinine, Ser: 1 mg/dL (ref 0.61–1.24)
GFR, Estimated: >60 mL/min (ref >60)
Glucose, Bld: 147 mg/dL — ABNORMAL HIGH (ref 70–99)
Potassium: 3.7 mmol/L (ref 3.5–5.1)
Sodium: 135 mmol/L (ref 135–145)

## 2022-01-26 LAB — POCT I-STAT EG7
Acid-Base Excess: 6 mmol/L — ABNORMAL HIGH (ref 0.0–2.0)
Acid-Base Excess: 6 mmol/L — ABNORMAL HIGH (ref 0.0–2.0)
Bicarbonate: 30.6 mmol/L — ABNORMAL HIGH (ref 20.0–28.0)
Bicarbonate: 30.6 mmol/L — ABNORMAL HIGH (ref 20.0–28.0)
Calcium, Ion: 1.18 mmol/L (ref 1.15–1.40)
Calcium, Ion: 1.19 mmol/L (ref 1.15–1.40)
HCT: 33 % — ABNORMAL LOW (ref 39.0–52.0)
HCT: 34 % — ABNORMAL LOW (ref 39.0–52.0)
Hemoglobin: 11.2 g/dL — ABNORMAL LOW (ref 13.0–17.0)
Hemoglobin: 11.6 g/dL — ABNORMAL LOW (ref 13.0–17.0)
O2 Saturation: 57 %
O2 Saturation: 58 %
Potassium: 2.9 mmol/L — ABNORMAL LOW (ref 3.5–5.1)
Potassium: 2.9 mmol/L — ABNORMAL LOW (ref 3.5–5.1)
Sodium: 136 mmol/L (ref 135–145)
Sodium: 136 mmol/L (ref 135–145)
TCO2: 32 mmol/L (ref 22–32)
TCO2: 32 mmol/L (ref 22–32)
pCO2, Ven: 42.5 mmHg — ABNORMAL LOW (ref 44–60)
pCO2, Ven: 42.6 mmHg — ABNORMAL LOW (ref 44–60)
pH, Ven: 7.463 — ABNORMAL HIGH (ref 7.25–7.43)
pH, Ven: 7.466 — ABNORMAL HIGH (ref 7.25–7.43)
pO2, Ven: 28 mmHg — CL (ref 32–45)
pO2, Ven: 29 mmHg — CL (ref 32–45)

## 2022-01-26 LAB — CBC
HCT: 35.7 % — ABNORMAL LOW (ref 39.0–52.0)
Hemoglobin: 12.1 g/dL — ABNORMAL LOW (ref 13.0–17.0)
MCH: 31.8 pg (ref 26.0–34.0)
MCHC: 33.9 g/dL (ref 30.0–36.0)
MCV: 93.9 fL (ref 80.0–100.0)
Platelets: 139 10*3/uL — ABNORMAL LOW (ref 150–400)
RBC: 3.8 MIL/uL — ABNORMAL LOW (ref 4.22–5.81)
RDW: 13.5 % (ref 11.5–15.5)
WBC: 7 10*3/uL (ref 4.0–10.5)
nRBC: 0 % (ref 0.0–0.2)

## 2022-01-26 LAB — POCT I-STAT 7, (LYTES, BLD GAS, ICA,H+H)
Acid-Base Excess: 9 mmol/L — ABNORMAL HIGH (ref 0.0–2.0)
Bicarbonate: 33.8 mmol/L — ABNORMAL HIGH (ref 20.0–28.0)
Calcium, Ion: 1.19 mmol/L (ref 1.15–1.40)
HCT: 33 % — ABNORMAL LOW (ref 39.0–52.0)
Hemoglobin: 11.2 g/dL — ABNORMAL LOW (ref 13.0–17.0)
O2 Saturation: 90 %
Potassium: 3 mmol/L — ABNORMAL LOW (ref 3.5–5.1)
Sodium: 136 mmol/L (ref 135–145)
TCO2: 35 mmol/L — ABNORMAL HIGH (ref 22–32)
pCO2 arterial: 45.5 mmHg (ref 32–48)
pH, Arterial: 7.478 — ABNORMAL HIGH (ref 7.35–7.45)
pO2, Arterial: 55 mmHg — ABNORMAL LOW (ref 83–108)

## 2022-01-26 LAB — PHOSPHORUS: Phosphorus: 3.5 mg/dL (ref 2.5–4.6)

## 2022-01-26 LAB — MAGNESIUM: Magnesium: 1.9 mg/dL (ref 1.7–2.4)

## 2022-01-26 LAB — GLUCOSE, CAPILLARY: Glucose-Capillary: 130 mg/dL — ABNORMAL HIGH (ref 70–99)

## 2022-01-26 SURGERY — RIGHT/LEFT HEART CATH AND CORONARY/GRAFT ANGIOGRAPHY
Anesthesia: LOCAL

## 2022-01-26 MED ORDER — HYDRALAZINE HCL 20 MG/ML IJ SOLN: 10.0000 mg | INTRAMUSCULAR | Status: DC | PRN

## 2022-01-26 MED ORDER — HEPARIN (PORCINE) IN NACL 1000-0.9 UT/500ML-% IV SOLN
INTRAVENOUS | Status: AC
  Filled 2022-01-26: qty 1000

## 2022-01-26 MED ORDER — HEPARIN (PORCINE) IN NACL 1000-0.9 UT/500ML-% IV SOLN
INTRAVENOUS | Status: DC | PRN
  Administered 2022-01-26 (×2): 500 mL

## 2022-01-26 MED ORDER — EMPAGLIFLOZIN 10 MG PO TABS
10.0000 mg | ORAL_TABLET | Freq: Every day | ORAL | Status: DC
  Administered 2022-01-27 – 2022-01-29 (×3): 10 mg via ORAL
  Filled 2022-01-26 (×3): qty 1

## 2022-01-26 MED ORDER — MIDAZOLAM HCL 2 MG/2ML IJ SOLN
INTRAMUSCULAR | Status: DC | PRN
  Administered 2022-01-26: 1 mg via INTRAVENOUS

## 2022-01-26 MED ORDER — SODIUM CHLORIDE 0.9 % IV SOLN: 250.0000 mL | INTRAVENOUS | Status: DC | PRN

## 2022-01-26 MED ORDER — DIAZEPAM 2 MG PO TABS: 2.0000 mg | ORAL_TABLET | Freq: Four times a day (QID) | ORAL | Status: DC | PRN

## 2022-01-26 MED ORDER — ACETAMINOPHEN 325 MG PO TABS
650.0000 mg | ORAL_TABLET | ORAL | Status: DC | PRN
Start: 2022-01-26 — End: 2022-01-29
  Administered 2022-01-28 (×2): 650 mg via ORAL

## 2022-01-26 MED ORDER — MIDAZOLAM HCL 2 MG/2ML IJ SOLN
INTRAMUSCULAR | Status: AC
  Filled 2022-01-26: qty 2

## 2022-01-26 MED ORDER — LIDOCAINE HCL (PF) 1 % IJ SOLN
INTRAMUSCULAR | Status: DC | PRN
  Administered 2022-01-26: 15 mL

## 2022-01-26 MED ORDER — LABETALOL HCL 5 MG/ML IV SOLN: 10.0000 mg | INTRAVENOUS | Status: DC | PRN

## 2022-01-26 MED ORDER — SACUBITRIL-VALSARTAN 24-26 MG PO TABS
1.0000 | ORAL_TABLET | Freq: Two times a day (BID) | ORAL | Status: DC
  Filled 2022-01-26: qty 1

## 2022-01-26 MED ORDER — ASPIRIN 81 MG PO CHEW
81.0000 mg | CHEWABLE_TABLET | Freq: Every day | ORAL | Status: DC
  Administered 2022-01-27 – 2022-01-29 (×3): 81 mg via ORAL
  Filled 2022-01-26 (×3): qty 1

## 2022-01-26 MED ORDER — SODIUM CHLORIDE 0.9% FLUSH
3.0000 mL | Freq: Two times a day (BID) | INTRAVENOUS | Status: DC
  Administered 2022-01-26: 3 mL via INTRAVENOUS

## 2022-01-26 MED ORDER — ONDANSETRON HCL 4 MG/2ML IJ SOLN
4.0000 mg | Freq: Four times a day (QID) | INTRAMUSCULAR | Status: DC | PRN
  Administered 2022-01-28: 4 mg via INTRAVENOUS
  Filled 2022-01-26: qty 2

## 2022-01-26 MED ORDER — SODIUM CHLORIDE 0.9% FLUSH
3.0000 mL | Freq: Two times a day (BID) | INTRAVENOUS | Status: DC
  Administered 2022-01-27 – 2022-01-29 (×6): 3 mL via INTRAVENOUS

## 2022-01-26 MED ORDER — ENOXAPARIN SODIUM 40 MG/0.4ML IJ SOSY
40.0000 mg | PREFILLED_SYRINGE | INTRAMUSCULAR | Status: DC
  Administered 2022-01-27 – 2022-01-29 (×3): 40 mg via SUBCUTANEOUS
  Filled 2022-01-26 (×3): qty 0.4

## 2022-01-26 MED ORDER — SODIUM CHLORIDE 0.9% FLUSH
3.0000 mL | INTRAVENOUS | Status: DC | PRN
Start: 2022-01-26 — End: 2022-01-29

## 2022-01-26 MED ORDER — LISINOPRIL 10 MG PO TABS: 10.0000 mg | ORAL_TABLET | Freq: Every day | ORAL | Status: DC

## 2022-01-26 MED ORDER — ASPIRIN 81 MG PO CHEW
81.0000 mg | CHEWABLE_TABLET | ORAL | Status: AC
  Administered 2022-01-26: 81 mg via ORAL
  Filled 2022-01-26: qty 1

## 2022-01-26 MED ORDER — SODIUM CHLORIDE 0.9 % IV SOLN: INTRAVENOUS | Status: DC

## 2022-01-26 MED ORDER — LIDOCAINE HCL (PF) 1 % IJ SOLN
INTRAMUSCULAR | Status: AC
  Filled 2022-01-26: qty 30

## 2022-01-26 MED ORDER — MAGNESIUM SULFATE 2 GM/50ML IV SOLN
2.0000 g | Freq: Once | INTRAVENOUS | Status: AC
Start: 2022-01-26 — End: 2022-01-26
  Administered 2022-01-26: 2 g via INTRAVENOUS
  Filled 2022-01-26: qty 50

## 2022-01-26 MED ORDER — SODIUM CHLORIDE 0.9 % IV SOLN: INTRAVENOUS | Status: AC

## 2022-01-26 MED ORDER — SODIUM CHLORIDE 0.9% FLUSH
3.0000 mL | INTRAVENOUS | Status: DC | PRN
  Administered 2022-01-26: 3 mL via INTRAVENOUS

## 2022-01-26 MED ORDER — IOHEXOL 350 MG/ML SOLN
INTRAVENOUS | Status: DC | PRN
  Administered 2022-01-26: 110 mL

## 2022-01-26 SURGICAL SUPPLY — 11 items
CATH INFINITI 5 FR IM (CATHETERS) ×1 IMPLANT
CATH INFINITI 5FR MULTPACK ANG (CATHETERS) ×1 IMPLANT
CATH SWAN GANZ 7F STRAIGHT (CATHETERS) ×1 IMPLANT
KIT HEART LEFT (KITS) ×2 IMPLANT
PACK CARDIAC CATHETERIZATION (CUSTOM PROCEDURE TRAY) ×2 IMPLANT
SHEATH PINNACLE 5F 10CM (SHEATH) ×1 IMPLANT
SHEATH PINNACLE 7F 10CM (SHEATH) ×1 IMPLANT
TRANSDUCER W/STOPCOCK (MISCELLANEOUS) ×2 IMPLANT
TUBING CIL FLEX 10 FLL-RA (TUBING) ×2 IMPLANT
WIRE EMERALD 3MM-J .035X150CM (WIRE) ×2 IMPLANT
WIRE EMERALD 3MM-J .035X260CM (WIRE) ×1 IMPLANT

## 2022-01-26 NOTE — Progress Notes (Signed)
Progress Note  Patient Name: [name] Date of Encounter: 01/26/2022  [facility] Cardiologist: Mertie Moores, MD   Subjective   Sitting up on edge of bed, Foley catheter in place, thin male.  Appears fairly comfortable currently.  Inpatient Medications    Scheduled Meds:  cholecalciferol  5,000 Units Oral Daily   clopidogrel  75 mg Oral Daily   enoxaparin (LOVENOX) injection  40 mg Subcutaneous Q24H   ezetimibe  10 mg Oral Daily   furosemide  40 mg Intravenous BID   isosorbide mononitrate  30 mg Oral Daily   multivitamin with minerals  1 tablet Oral Daily   Continuous Infusions:  PRN Meds: acetaminophen **OR** acetaminophen, albuterol, ondansetron **OR** ondansetron (ZOFRAN) IV, senna-docusate   Vital Signs    Vitals:   01/25/22 1300 01/25/22 1934 01/25/22 2334 01/26/22 0434  BP:  99/74 103/68 113/83  Pulse: 86 74 78 82  Resp: '18 20 18 15  '$ Temp:  97.6 F (36.4 C) 97.6 F (36.4 C) 97.9 F (36.6 C)  TempSrc:  Oral Oral Oral  SpO2: 95% 99% 98% 100%  Weight:    62.8 kg  Height:        Intake/Output Summary (Last 24 hours) at 01/26/2022 0907 Last data filed at 01/26/2022 0500 Gross per 24 hour  Intake --  Output 3275 ml  Net -3275 ml      01/26/2022    4:34 AM 01/25/2022    6:36 AM 01/24/2022    9:29 PM  Last 3 Weights  Weight (lbs) 138 lb 6.4 oz 141 lb 5 oz 142 lb 1.6 oz  Weight (kg) 62.778 kg 64.1 kg 64.456 kg      Telemetry    Sinus rhythm, frequent PVCs- Personally Reviewed  ECG    Sinus rhythm frequent PVCs- Personally Reviewed  Physical Exam   GEN: No acute distress.  Thin Neck: Mid neck JVD Cardiac: RRR, 2/6 holosystolic murmur at apex, no rubs, or gallops.  Respiratory: Clear to auscultation creased breath sounds at bases bilaterally. GI: Soft, nontender, non-distended  MS: No edema; No deformity. Neuro:  Nonfocal  Psych: Normal affect   Labs    High Sensitivity Troponin:   Recent Labs  Lab 01/24/22 2016 01/24/22 2215  01/25/22 0939 01/25/22 1141  TROPONINIHS 57* 59* 34* 37*     Chemistry Recent Labs  Lab 01/24/22 1229 01/25/22 0312 01/26/22 0348  NA 137 136 135  K 3.7 3.9 3.7  CL 103 101 99  CO2 '22 25 29  '$ GLUCOSE 145* 147* 147*  BUN '13 12 13  '$ CREATININE 0.79 0.90 1.00  CALCIUM 9.1 8.9 8.8*  MG 1.5* 2.0 1.9  GFRNONAA >60 >60 >60  ANIONGAP '12 10 7    '$ Lipids No results for input(s): "CHOL", "TRIG", "HDL", "LABVLDL", "LDLCALC", "CHOLHDL" in the last 168 hours.  Hematology Recent Labs  Lab 01/24/22 1229 01/25/22 0312 01/26/22 0348  WBC 6.7 6.2 7.0  RBC 3.57* 3.58* 3.80*  HGB 11.4* 11.6* 12.1*  HCT 33.4* 32.8* 35.7*  MCV 93.6 91.6 93.9  MCH 31.9 32.4 31.8  MCHC 34.1 35.4 33.9  RDW 13.4 13.5 13.5  PLT 151 141* 139*   Thyroid  Recent Labs  Lab 01/24/22 2016  TSH 2.684    BNP Recent Labs  Lab 01/24/22 1229  BNP 1,092.3*    DDimer No results for input(s): "DDIMER" in the last 168 hours.   Radiology    ECHOCARDIOGRAM COMPLETE  Result Date: 01/25/2022    ECHOCARDIOGRAM REPORT  Patient Name:   [name] [facility] Date of Exam: 01/25/2022 Medical Rec #:  [id number]        Height:       69.0 in Accession #:    [id number]       Weight:       141.3 lb Date of Birth:  02-03-1941        BSA:          1.782 m Patient Age:    81 years         BP:           143/95 mmHg Patient Gender: M                HR:           87 bpm. Exam Location:  Inpatient Procedure: 2D Echo Indications:    dyspnea  History:        Patient has prior history of Echocardiogram examinations, most                 recent 12/16/2020. CHF, CAD, COPD, Arrythmias:PAC, PVC and Atrial                 Fibrillation; Risk Factors:Dyslipidemia, Hypertension and                 Diabetes.  Sonographer:    Johny Chess RDCS Referring Phys: [id number] NISCHAL NARENDRA IMPRESSIONS  1. TTE with newly reduced LVEF of <20%, multiple wall motion abnormalities and at least moderate-to-severe MR with concern for a torn chordae. Recommend cath and  TEE for further evaluation.  2. Left ventricular ejection fraction, by estimation, is <20%. The left ventricle has severely decreased function. The left ventricle demonstrates regional wall motion abnormalities (see scoring diagram/findings for description). There is severe global hypokinesis which appears worse in the anterior, septal and apical segments. Left ventricular diastolic parameters are consistent with Grade II diastolic dysfunction (pseudonormalization). Recommend cath for further work-up.  3. There is a highly mobile, echobright density noted in the LV in the subvalvular apparatus most concerning for a torn chordae given degree of mitral regurgitation that has significantly progressed from prior study. Recommend TEE for further evaluation.  4. Right ventricular systolic function is mildly reduced. The right ventricular size is normal. There is severely elevated pulmonary artery systolic pressure. The estimated right ventricular systolic pressure is 16.3 mmHg.  5. Left atrial size was moderately dilated.  6. The mitral valve is abnormal. Supect there is a torn chordae with at least moderate-to-severe mitral regurgitation. By color flow doppler, the degree of mitral regurgitation appears more moderate-to-severe, however, there is systolic flow reversal in  the pulmonary vein which is more suggestive of severe mitral regurgitation. Recommend TEE for further evaluation as above.  7. Tricuspid valve regurgitation is mild to moderate.  8. The aortic valve is tricuspid. There is mild calcification of the aortic valve. There is mild thickening of the aortic valve. Aortic valve regurgitation is not visualized. Aortic valve sclerosis/calcification is present, without any evidence of aortic stenosis.  9. Aortic dilatation noted. There is borderline dilatation of the ascending aorta, measuring 38 mm. 10. The inferior vena cava is normal in size with greater than 50% respiratory variability, suggesting right atrial  pressure of 3 mmHg. Comparison(s): Compared to prior TTE on 12/16/20, the LVEF is now severely reduced (previously 50-55%) with wall motion abnormalities as described above. There is now at least moderate-to-severe MR with concern for a torn chordae. Recommend cath and TEE  for further work-up. FINDINGS  Left Ventricle: There is a highly mobile, echobright density noted in the LV in the subvalvular apparatus that is most concerning for a torn chordae given degree of mitral valve regurgitation. Recommend TEE for further evaluation. Left ventricular ejection fraction, by estimation, is <20%. The left ventricle has severely decreased function. The left ventricle demonstrates regional wall motion abnormalities. The left ventricular internal cavity size was normal in size. There is no left ventricular hypertrophy. Left ventricular diastolic parameters are consistent with Grade II diastolic dysfunction (pseudonormalization). Right Ventricle: The right ventricular size is normal. No increase in right ventricular wall thickness. Right ventricular systolic function is mildly reduced. There is severely elevated pulmonary artery systolic pressure. The tricuspid regurgitant velocity is 4.40 m/s, and with an assumed right atrial pressure of 3 mmHg, the estimated right ventricular systolic pressure is 97.6 mmHg. Left Atrium: Left atrial size was moderately dilated. Right Atrium: Right atrial size was normal in size. Pericardium: There is no evidence of pericardial effusion. Mitral Valve: The mitral valve is abnormal. There is mild thickening of the mitral valve leaflet(s). There is mild calcification of the mitral valve leaflet(s). Mild mitral annular calcification. Moderate to severe mitral valve regurgitation. Tricuspid Valve: The tricuspid valve is normal in structure. Tricuspid valve regurgitation is mild to moderate. Aortic Valve: The aortic valve is tricuspid. There is mild calcification of the aortic valve. There is mild  thickening of the aortic valve. Aortic valve regurgitation is not visualized. Aortic valve sclerosis/calcification is present, without any evidence of aortic stenosis. Pulmonic Valve: The pulmonic valve was normal in structure. Pulmonic valve regurgitation is trivial. Aorta: Aortic dilatation noted. There is borderline dilatation of the ascending aorta, measuring 38 mm. Venous: The inferior vena cava is normal in size with greater than 50% respiratory variability, suggesting right atrial pressure of 3 mmHg. IAS/Shunts: The atrial septum is grossly normal.  LEFT VENTRICLE PLAX 2D LVIDd:         5.60 cm LVIDs:         5.10 cm LV PW:         1.00 cm LV IVS:        1.00 cm LVOT diam:     2.50 cm LV SV:         68 LV SV Index:   38 LVOT Area:     4.91 cm  RIGHT VENTRICLE         IVC TAPSE (M-mode): 1.6 cm  IVC diam: 1.80 cm LEFT ATRIUM             Index        RIGHT ATRIUM           Index LA diam:        5.00 cm 2.81 cm/m   RA Area:     15.80 cm LA Vol (A2C):   80.2 ml 44.99 ml/m  RA Volume:   42.20 ml  23.67 ml/m LA Vol (A4C):   76.1 ml 42.69 ml/m LA Biplane Vol: 80.6 ml 45.22 ml/m  AORTIC VALVE LVOT Vmax:   74.50 cm/s LVOT Vmean:  48.000 cm/s LVOT VTI:    0.138 m  AORTA Ao Root diam: 3.40 cm Ao Asc diam:  3.80 cm TRICUSPID VALVE TR Peak grad:   77.4 mmHg TR Vmax:        440.00 cm/s  SHUNTS Systemic VTI:  0.14 m Systemic Diam: 2.50 cm Gwyndolyn Kaufman MD Electronically signed by Gwyndolyn Kaufman MD Signature Date/Time: 01/25/2022/11:52:10 AM  Final    DG CHEST PORT 1 VIEW  Result Date: 01/25/2022 CLINICAL DATA:  Dyspnea on exertion.  Coronary artery disease. EXAM: PORTABLE CHEST 1 VIEW COMPARISON:  Two-view chest x-ray 01/24/2022 FINDINGS: The heart is enlarged. Atherosclerotic changes are present at the aortic arch. Marked increase in diffuse interstitial edema is present. Bilateral pleural effusions and basilar airspace disease have increased as well. IMPRESSION: 1. Cardiomegaly with increasing  interstitial edema and bilateral pleural effusions compatible with congestive heart failure. 2. Bibasilar airspace disease likely reflects atelectasis. Electronically Signed   By: San Morelle M.D.   On: 01/25/2022 10:29   DG Chest 2 View  Result Date: 01/24/2022 CLINICAL DATA:  Acute shortness of breath. EXAM: CHEST - 2 VIEW COMPARISON:  12/27/2009 chest CT FINDINGS: Cardiomegaly and CABG changes noted. Pulmonary vascular congestion and mild interstitial opacities are present likely representing mild interstitial edema. Small bilateral pleural effusions and bibasilar opacities/atelectasis noted. There is no evidence of pneumothorax or acute bony abnormality. IMPRESSION: Cardiomegaly with mild interstitial pulmonary edema, small bilateral pleural effusions and bibasilar-favor atelectasis. Electronically Signed   By: Margarette Canada M.D.   On: 01/24/2022 12:36    Cardiac Studies   See below  Patient Profile     81 y.o. male with coronary artery disease status post CABG with PCI in 2007 subsequently, hypertension diabetes chronic diastolic heart failure with newly diagnosed systolic heart failure with a EF less than 20% and moderate to severe mitral regurgitation.  Sees Dr. Acie Fredrickson as outpatient.  Assessment & Plan    Severe mitral regurgitation Acute on chronic systolic heart failure - In May 2022 EF was 50% with mild to moderate MR.  On presentation he was progressively weak short of breath with ejection fraction less than 20%.  Had moderate to severe mitral regurgitation with concern for torn cordae.  Pulmonary pressures were also severely elevated. -Plan for right and left heart cath today. -TEE to follow on Wednesday, Dr. Harriet Masson -Lasix currently 40 mg IV twice daily.  Good overall diuresis.  Feeling better. -Off of metoprolol given significant reduced EF. -Current creatinine 1.0 stable.  BNP 1092 hemoglobin 12.1  CAD status post CABG - With significant drop in EF it would be helpful to  define his anatomy and make sure that there are no grafts that are down. troponin currently minimally elevated and flat compatible with acute injury.  No ischemic changes on ECG. -Currently on Plavix 75 mg. -Continue with Zetia 10.  Did not tolerate statins -Risks and benefits have been explained previously.  Daughter in room.  Statin intolerance - On Zetia      For questions or updates, please contact [place] Please consult [email or web address] for contact info under        Signed, Candee Furbish, MD  01/26/2022, 9:07 AM

## 2022-01-26 NOTE — TOC Benefit Eligibility Note (Signed)
Patient Research scientist (life sciences) completed.     The patient is currently admitted and upon discharge could be taking Entresto 24-26 mg.   The current 30 day co-pay is, $47.   The patient is currently admitted and upon discharge could be taking Jardiance 10 mg.   The current 30 day co-pay is, $47.   The patient is insured through [employer].

## 2022-01-26 NOTE — Progress Notes (Addendum)
SITE AREA: right femoral/groin  SITE PRIOR TO REMOVAL:  LEVEL 0  PRESSURE APPLIED FOR: approximately 20 minutes for arterial sheath and approximately 10 minutes for veinous sheath   MANUAL: yes  PATIENT STATUS DURING PULL: stable and alert  POST PULL SITE:  LEVEL 0  POST PULL INSTRUCTIONS GIVEN: yes  POST PULL PULSES PRESENT: bilateral pedal pulses palpable at +2  DRESSING APPLIED: gauze with tegaderm  BEDREST BEGINS @ 1925  COMMENTS:  arterial sheath removed first, then veinous

## 2022-01-26 NOTE — Telephone Encounter (Signed)
Pharmacy Patient Advocate Encounter  Insurance verification completed.    The patient is insured through [facility]  The patient is currently admitted and ran test claims for the following: [name] and Entresto.  Copays and coinsurance results were relayed to Inpatient clinical team.

## 2022-01-26 NOTE — Progress Notes (Signed)
Heart Failure Navigator Progress Note  Following this hospitalization to assess for HV TOC readiness.   EF < 20 % G2DD   R / L Heart Cath 01/26/22 TEE 01/28/22  Earnestine Leys, BSN, RN Heart Failure Nurse Navigator Secure Chat Only

## 2022-01-26 NOTE — Interval H&P Note (Signed)
Cath Lab Visit (complete for each Cath Lab visit)  Clinical Evaluation Leading to the Procedure:   ACS: No.  Non-ACS:    Anginal Classification: CCS I  Anti-ischemic medical therapy: Maximal Therapy (2 or more classes of medications)  Non-Invasive Test Results: No non-invasive testing performed  Prior CABG: Previous CABG      History and Physical Interval Note:  01/26/2022 5:35 PM  [name]  has presented today for surgery, with the diagnosis of severe MR.  The various methods of treatment have been discussed with the patient and family. After consideration of risks, benefits and other options for treatment, the patient has consented to  Procedure(s): RIGHT/LEFT HEART CATH AND CORONARY/GRAFT ANGIOGRAPHY (N/A) as a surgical intervention.  The patient's history has been reviewed, patient examined, no change in status, stable for surgery.  I have reviewed the patient's chart and labs.  Questions were answered to the patient's satisfaction.     Shelva Majestic

## 2022-01-26 NOTE — Progress Notes (Addendum)
Subjective:   Hospital day 1.  Interval events: No events overnight.  Patient feels much improved this morning.  He is still somewhat short of breath, but it is improved since yesterday morning.  He was able to lay flat comfortably for most of the night last night.  He has a good appetite.  He denies chest pain, chest pressure, chest tightness.  He denies new or worsening cough.  He complains of nodules in his legs.  Thinks it may be varicose veins.  Objective:  Vital signs: Blood pressure 90/62, pulse 70, temperature 97.6 F (36.4 C), temperature source Axillary, resp. rate 20, height '5\' 9"'$  (1.753 m), weight 62.8 kg, SpO2 96 %.  Supplemental O2: 2 L [place]  Physical exam: Constitutional: Elderly male sitting upright on edge of bed eating breakfast.  No apparent distress. Cardiovascular: Irregular rhythm. Pulmonary: Crackles bilateral lower lungs.  No increased work of breathing. Skin: Warm and dry. Extremities: Soft nontender, nonerythematous nodules bilateral lower legs Neuro: Alert and oriented. Psych: Appropriate mood and affect.   Intake/Output Summary (Last 24 hours) at 01/26/2022 1217 Last data filed at 01/26/2022 0500 Gross per 24 hour  Intake --  Output 2600 ml  Net -2600 ml    Pertinent Labs:    Latest Ref Rng & Units 01/26/2022    3:48 AM 01/25/2022    3:12 AM 01/24/2022   12:29 PM  CBC  WBC 4.0 - 10.5 K/uL 7.0  6.2  6.7   Hemoglobin 13.0 - 17.0 g/dL 12.1  11.6  11.4   Hematocrit 39.0 - 52.0 % 35.7  32.8  33.4   Platelets 150 - 400 K/uL 139  141  151        Latest Ref Rng & Units 01/26/2022    3:48 AM 01/25/2022    3:12 AM 01/24/2022   12:29 PM  CMP  Glucose 70 - 99 mg/dL 147  147  145   BUN 8 - 23 mg/dL '13  12  13   '$ Creatinine 0.61 - 1.24 mg/dL 1.00  0.90  0.79   Sodium 135 - 145 mmol/L 135  136  137   Potassium 3.5 - 5.1 mmol/L 3.7  3.9  3.7   Chloride 98 - 111 mmol/L 99  101  103   CO2 22 - 32 mmol/L '29  25  22   '$ Calcium 8.9 - 10.3  mg/dL 8.8  8.9  9.1    Magnesium: 1.9 Phosphorus: 3.5  Assessment/Plan:   Principal Problem:   Acute exacerbation of CHF (congestive heart failure) (HCC) Active Problems:   Shortness of breath   Multifocal atrial tachycardia (HCC)   Acute respiratory failure with hypoxia (HCC)   Mitral regurgitation   Patient Summary: [name] is a 81 y.o. with a PMH of coronary artery disease, coronary artery bypass graft, who presented with generalized weakness and shortness of breath and was admitted for acute heart failure with reduced EF secondary to MR.   Heart failure exacerbation likely secondary to severe mitral regurgitation Hypoxic respiratory failure Irregular sinus tachycardia Symptoms are improving.  Remains hemodynamically stable.  Cardiology will likely attempt RHC/LHC today.  TEE likely Wednesday to evaluate mitral valve dysfunction.  Now that patient is stable we will begin GDMT for HFrEF.  -- Holding home metoprolol, may restart tomorrow if RHC numbers are reasonable - Change home lisinopril to low dose Entresto after LHC, maybe tomorrow. Has  had lisinopril washout.  -- Start farxiga before discharge - Continue Lasix 40 mg IV twice daily. - Continue home Imdur 30 mg p.o. daily. - Continue strict I/O monitoring. - Continue daily weights. - Continue monitoring on telemetry. - Daily BMP.  Coronary artery disease status post coronary artery bypass graft TTE suggested torn chordae.  MR likely no acute given sudden worsening of symptoms 5 days prior to admission.  Cannot rule out dilated ischemic cardiomyopathy and subsequent MR. - Continue home Plavix 75 mg p.o. daily. - Continue home ezetimibe 10 mg p.o. daily. - Allergy listed to lipitor of memory loss. Will check lipids. Maybe we can try crestor.  Constipation Patient has not had a bowel movement since morning of 01/24/2022. - Continue senna docusate 1 tablet p.o. as needed at bedtime for mild constipation.  Vitamin  D deficiency - Continue cholecalciferol 5000 units daily  ITP Platelets at baseline.  Transitions of care PT/OT following.  Appreciate their recommendations.  Diet: Normal IVF: None,None VTE: Enoxaparin Code: Full PT/OT recs: Home health. Family Update: yes.    Dispo: Anticipated discharge to Home in 2 days pending medical treatment of acute medical problems.  Nani Gasser, MD 01/26/2022, 12:17 PM  Pager: [phone] After 5pm on weekdays and 1pm on weekends: On Call pager: [phone]

## 2022-01-26 NOTE — H&P (View-Only) (Signed)
Progress Note  Patient Name: [name] Date of Encounter: 01/26/2022  [facility] Cardiologist: Mertie Moores, MD   Subjective   Sitting up on edge of bed, Foley catheter in place, thin male.  Appears fairly comfortable currently.  Inpatient Medications    Scheduled Meds:  cholecalciferol  5,000 Units Oral Daily   clopidogrel  75 mg Oral Daily   enoxaparin (LOVENOX) injection  40 mg Subcutaneous Q24H   ezetimibe  10 mg Oral Daily   furosemide  40 mg Intravenous BID   isosorbide mononitrate  30 mg Oral Daily   multivitamin with minerals  1 tablet Oral Daily   Continuous Infusions:  PRN Meds: acetaminophen **OR** acetaminophen, albuterol, ondansetron **OR** ondansetron (ZOFRAN) IV, senna-docusate   Vital Signs    Vitals:   01/25/22 1300 01/25/22 1934 01/25/22 2334 01/26/22 0434  BP:  99/74 103/68 113/83  Pulse: 86 74 78 82  Resp: '18 20 18 15  '$ Temp:  97.6 F (36.4 C) 97.6 F (36.4 C) 97.9 F (36.6 C)  TempSrc:  Oral Oral Oral  SpO2: 95% 99% 98% 100%  Weight:    62.8 kg  Height:        Intake/Output Summary (Last 24 hours) at 01/26/2022 0907 Last data filed at 01/26/2022 0500 Gross per 24 hour  Intake --  Output 3275 ml  Net -3275 ml      01/26/2022    4:34 AM 01/25/2022    6:36 AM 01/24/2022    9:29 PM  Last 3 Weights  Weight (lbs) 138 lb 6.4 oz 141 lb 5 oz 142 lb 1.6 oz  Weight (kg) 62.778 kg 64.1 kg 64.456 kg      Telemetry    Sinus rhythm, frequent PVCs- Personally Reviewed  ECG    Sinus rhythm frequent PVCs- Personally Reviewed  Physical Exam   GEN: No acute distress.  Thin Neck: Mid neck JVD Cardiac: RRR, 2/6 holosystolic murmur at apex, no rubs, or gallops.  Respiratory: Clear to auscultation creased breath sounds at bases bilaterally. GI: Soft, nontender, non-distended  MS: No edema; No deformity. Neuro:  Nonfocal  Psych: Normal affect   Labs    High Sensitivity Troponin:   Recent Labs  Lab 01/24/22 2016 01/24/22 2215  01/25/22 0939 01/25/22 1141  TROPONINIHS 57* 59* 34* 37*     Chemistry Recent Labs  Lab 01/24/22 1229 01/25/22 0312 01/26/22 0348  NA 137 136 135  K 3.7 3.9 3.7  CL 103 101 99  CO2 '22 25 29  '$ GLUCOSE 145* 147* 147*  BUN '13 12 13  '$ CREATININE 0.79 0.90 1.00  CALCIUM 9.1 8.9 8.8*  MG 1.5* 2.0 1.9  GFRNONAA >60 >60 >60  ANIONGAP '12 10 7    '$ Lipids No results for input(s): "CHOL", "TRIG", "HDL", "LABVLDL", "LDLCALC", "CHOLHDL" in the last 168 hours.  Hematology Recent Labs  Lab 01/24/22 1229 01/25/22 0312 01/26/22 0348  WBC 6.7 6.2 7.0  RBC 3.57* 3.58* 3.80*  HGB 11.4* 11.6* 12.1*  HCT 33.4* 32.8* 35.7*  MCV 93.6 91.6 93.9  MCH 31.9 32.4 31.8  MCHC 34.1 35.4 33.9  RDW 13.4 13.5 13.5  PLT 151 141* 139*   Thyroid  Recent Labs  Lab 01/24/22 2016  TSH 2.684    BNP Recent Labs  Lab 01/24/22 1229  BNP 1,092.3*    DDimer No results for input(s): "DDIMER" in the last 168 hours.   Radiology    ECHOCARDIOGRAM COMPLETE  Result Date: 01/25/2022    ECHOCARDIOGRAM REPORT  Patient Name:   RADAMES MEJORADO [facility] Date of Exam: 01/25/2022 Medical Rec #:  [id number]        Height:       69.0 in Accession #:    [id number]       Weight:       141.3 lb Date of Birth:  1940/09/28        BSA:          1.782 m Patient Age:    29 years         BP:           143/95 mmHg Patient Gender: M                HR:           87 bpm. Exam Location:  Inpatient Procedure: 2D Echo Indications:    dyspnea  History:        Patient has prior history of Echocardiogram examinations, most                 recent 12/16/2020. CHF, CAD, COPD, Arrythmias:PAC, PVC and Atrial                 Fibrillation; Risk Factors:Dyslipidemia, Hypertension and                 Diabetes.  Sonographer:    Johny Chess RDCS Referring Phys: [id number] NISCHAL NARENDRA IMPRESSIONS  1. TTE with newly reduced LVEF of <20%, multiple wall motion abnormalities and at least moderate-to-severe MR with concern for a torn chordae. Recommend cath and  TEE for further evaluation.  2. Left ventricular ejection fraction, by estimation, is <20%. The left ventricle has severely decreased function. The left ventricle demonstrates regional wall motion abnormalities (see scoring diagram/findings for description). There is severe global hypokinesis which appears worse in the anterior, septal and apical segments. Left ventricular diastolic parameters are consistent with Grade II diastolic dysfunction (pseudonormalization). Recommend cath for further work-up.  3. There is a highly mobile, echobright density noted in the LV in the subvalvular apparatus most concerning for a torn chordae given degree of mitral regurgitation that has significantly progressed from prior study. Recommend TEE for further evaluation.  4. Right ventricular systolic function is mildly reduced. The right ventricular size is normal. There is severely elevated pulmonary artery systolic pressure. The estimated right ventricular systolic pressure is 73.5 mmHg.  5. Left atrial size was moderately dilated.  6. The mitral valve is abnormal. Supect there is a torn chordae with at least moderate-to-severe mitral regurgitation. By color flow doppler, the degree of mitral regurgitation appears more moderate-to-severe, however, there is systolic flow reversal in  the pulmonary vein which is more suggestive of severe mitral regurgitation. Recommend TEE for further evaluation as above.  7. Tricuspid valve regurgitation is mild to moderate.  8. The aortic valve is tricuspid. There is mild calcification of the aortic valve. There is mild thickening of the aortic valve. Aortic valve regurgitation is not visualized. Aortic valve sclerosis/calcification is present, without any evidence of aortic stenosis.  9. Aortic dilatation noted. There is borderline dilatation of the ascending aorta, measuring 38 mm. 10. The inferior vena cava is normal in size with greater than 50% respiratory variability, suggesting right atrial  pressure of 3 mmHg. Comparison(s): Compared to prior TTE on 12/16/20, the LVEF is now severely reduced (previously 50-55%) with wall motion abnormalities as described above. There is now at least moderate-to-severe MR with concern for a torn chordae. Recommend cath and TEE  for further work-up. FINDINGS  Left Ventricle: There is a highly mobile, echobright density noted in the LV in the subvalvular apparatus that is most concerning for a torn chordae given degree of mitral valve regurgitation. Recommend TEE for further evaluation. Left ventricular ejection fraction, by estimation, is <20%. The left ventricle has severely decreased function. The left ventricle demonstrates regional wall motion abnormalities. The left ventricular internal cavity size was normal in size. There is no left ventricular hypertrophy. Left ventricular diastolic parameters are consistent with Grade II diastolic dysfunction (pseudonormalization). Right Ventricle: The right ventricular size is normal. No increase in right ventricular wall thickness. Right ventricular systolic function is mildly reduced. There is severely elevated pulmonary artery systolic pressure. The tricuspid regurgitant velocity is 4.40 m/s, and with an assumed right atrial pressure of 3 mmHg, the estimated right ventricular systolic pressure is 74.0 mmHg. Left Atrium: Left atrial size was moderately dilated. Right Atrium: Right atrial size was normal in size. Pericardium: There is no evidence of pericardial effusion. Mitral Valve: The mitral valve is abnormal. There is mild thickening of the mitral valve leaflet(s). There is mild calcification of the mitral valve leaflet(s). Mild mitral annular calcification. Moderate to severe mitral valve regurgitation. Tricuspid Valve: The tricuspid valve is normal in structure. Tricuspid valve regurgitation is mild to moderate. Aortic Valve: The aortic valve is tricuspid. There is mild calcification of the aortic valve. There is mild  thickening of the aortic valve. Aortic valve regurgitation is not visualized. Aortic valve sclerosis/calcification is present, without any evidence of aortic stenosis. Pulmonic Valve: The pulmonic valve was normal in structure. Pulmonic valve regurgitation is trivial. Aorta: Aortic dilatation noted. There is borderline dilatation of the ascending aorta, measuring 38 mm. Venous: The inferior vena cava is normal in size with greater than 50% respiratory variability, suggesting right atrial pressure of 3 mmHg. IAS/Shunts: The atrial septum is grossly normal.  LEFT VENTRICLE PLAX 2D LVIDd:         5.60 cm LVIDs:         5.10 cm LV PW:         1.00 cm LV IVS:        1.00 cm LVOT diam:     2.50 cm LV SV:         68 LV SV Index:   38 LVOT Area:     4.91 cm  RIGHT VENTRICLE         IVC TAPSE (M-mode): 1.6 cm  IVC diam: 1.80 cm LEFT ATRIUM             Index        RIGHT ATRIUM           Index LA diam:        5.00 cm 2.81 cm/m   RA Area:     15.80 cm LA Vol (A2C):   80.2 ml 44.99 ml/m  RA Volume:   42.20 ml  23.67 ml/m LA Vol (A4C):   76.1 ml 42.69 ml/m LA Biplane Vol: 80.6 ml 45.22 ml/m  AORTIC VALVE LVOT Vmax:   74.50 cm/s LVOT Vmean:  48.000 cm/s LVOT VTI:    0.138 m  AORTA Ao Root diam: 3.40 cm Ao Asc diam:  3.80 cm TRICUSPID VALVE TR Peak grad:   77.4 mmHg TR Vmax:        440.00 cm/s  SHUNTS Systemic VTI:  0.14 m Systemic Diam: 2.50 cm Gwyndolyn Kaufman MD Electronically signed by Gwyndolyn Kaufman MD Signature Date/Time: 01/25/2022/11:52:10 AM  Final    DG CHEST PORT 1 VIEW  Result Date: 01/25/2022 CLINICAL DATA:  Dyspnea on exertion.  Coronary artery disease. EXAM: PORTABLE CHEST 1 VIEW COMPARISON:  Two-view chest x-ray 01/24/2022 FINDINGS: The heart is enlarged. Atherosclerotic changes are present at the aortic arch. Marked increase in diffuse interstitial edema is present. Bilateral pleural effusions and basilar airspace disease have increased as well. IMPRESSION: 1. Cardiomegaly with increasing  interstitial edema and bilateral pleural effusions compatible with congestive heart failure. 2. Bibasilar airspace disease likely reflects atelectasis. Electronically Signed   By: San Morelle M.D.   On: 01/25/2022 10:29   DG Chest 2 View  Result Date: 01/24/2022 CLINICAL DATA:  Acute shortness of breath. EXAM: CHEST - 2 VIEW COMPARISON:  12/27/2009 chest CT FINDINGS: Cardiomegaly and CABG changes noted. Pulmonary vascular congestion and mild interstitial opacities are present likely representing mild interstitial edema. Small bilateral pleural effusions and bibasilar opacities/atelectasis noted. There is no evidence of pneumothorax or acute bony abnormality. IMPRESSION: Cardiomegaly with mild interstitial pulmonary edema, small bilateral pleural effusions and bibasilar-favor atelectasis. Electronically Signed   By: Margarette Canada M.D.   On: 01/24/2022 12:36    Cardiac Studies   See below  Patient Profile     81 y.o. male with coronary artery disease status post CABG with PCI in 2007 subsequently, hypertension diabetes chronic diastolic heart failure with newly diagnosed systolic heart failure with a EF less than 20% and moderate to severe mitral regurgitation.  Sees Dr. Acie Fredrickson as outpatient.  Assessment & Plan    Severe mitral regurgitation Acute on chronic systolic heart failure - In May 2022 EF was 50% with mild to moderate MR.  On presentation he was progressively weak short of breath with ejection fraction less than 20%.  Had moderate to severe mitral regurgitation with concern for torn cordae.  Pulmonary pressures were also severely elevated. -Plan for right and left heart cath today. -TEE to follow on Wednesday, Dr. Harriet Masson -Lasix currently 40 mg IV twice daily.  Good overall diuresis.  Feeling better. -Off of metoprolol given significant reduced EF. -Current creatinine 1.0 stable.  BNP 1092 hemoglobin 12.1  CAD status post CABG - With significant drop in EF it would be helpful to  define his anatomy and make sure that there are no grafts that are down. troponin currently minimally elevated and flat compatible with acute injury.  No ischemic changes on ECG. -Currently on Plavix 75 mg. -Continue with Zetia 10.  Did not tolerate statins -Risks and benefits have been explained previously.  Daughter in room.  Statin intolerance - On Zetia      For questions or updates, please contact [place] Please consult [email or web address] for contact info under        Signed, Candee Furbish, MD  01/26/2022, 9:07 AM

## 2022-01-26 NOTE — Care Management (Signed)
  Transition of Care [facility]) Screening Note   Patient Details  Name: [name] Date of Birth: 12/18/1940   Transition of Care [facility]) CM/SW Contact:    Bethena Roys, RN Phone Number: 01/26/2022, 3:32 PM    Transition of Care Department [facility]) has reviewed the patient. Case Manager reviewed the chart and PT/OT stated recommendations for [facility]. Case Manager spoke with the patient and he declines home health services. Family is at the bedside during the conversation. PTA patient was from home with his spouse and will return home without services. Patient states he has a cane and rolling walker in the home. Case Manager did discuss with the patient that if he needs services in the future to call his PCP for home health orders. No further needs identified at this time.

## 2022-01-27 ENCOUNTER — Encounter (HOSPITAL_COMMUNITY): Payer: Self-pay | Admitting: Cardiovascular Disease

## 2022-01-27 DIAGNOSIS — I5043 Acute on chronic combined systolic (congestive) and diastolic (congestive) heart failure: Secondary | ICD-10-CM | POA: Diagnosis not present

## 2022-01-27 LAB — CBC
HCT: 33.8 % — ABNORMAL LOW (ref 39.0–52.0)
Hemoglobin: 11.8 g/dL — ABNORMAL LOW (ref 13.0–17.0)
MCH: 32.1 pg (ref 26.0–34.0)
MCHC: 34.9 g/dL (ref 30.0–36.0)
MCV: 91.8 fL (ref 80.0–100.0)
Platelets: 92 10*3/uL — ABNORMAL LOW (ref 150–400)
RBC: 3.68 MIL/uL — ABNORMAL LOW (ref 4.22–5.81)
RDW: 13.4 % (ref 11.5–15.5)
WBC: 6.4 10*3/uL (ref 4.0–10.5)
nRBC: 0 % (ref 0.0–0.2)

## 2022-01-27 LAB — BASIC METABOLIC PANEL
Anion gap: 13 (ref 5–15)
BUN: 13 mg/dL (ref 8–23)
CO2: 23 mmol/L (ref 22–32)
Calcium: 8.4 mg/dL — ABNORMAL LOW (ref 8.9–10.3)
Chloride: 99 mmol/L (ref 98–111)
Creatinine, Ser: 0.73 mg/dL (ref 0.61–1.24)
GFR, Estimated: >60 mL/min (ref >60)
Glucose, Bld: 142 mg/dL — ABNORMAL HIGH (ref 70–99)
Potassium: 3.7 mmol/L (ref 3.5–5.1)
Sodium: 135 mmol/L (ref 135–145)

## 2022-01-27 MED ORDER — SODIUM CHLORIDE 0.9 % IV SOLN: INTRAVENOUS | Status: DC

## 2022-01-27 NOTE — Plan of Care (Signed)
  Problem: Education: Goal: Knowledge of disease or condition will improve Outcome: Progressing Goal: Understanding of medication regimen will improve Outcome: Progressing Goal: Individualized Educational Video(s) Outcome: Progressing   Problem: Activity: Goal: Ability to tolerate increased activity will improve Outcome: Progressing   Problem: Cardiac: Goal: Ability to achieve and maintain adequate cardiopulmonary perfusion will improve Outcome: Progressing   Problem: Health Behavior/Discharge Planning: Goal: Ability to safely manage health-related needs after discharge will improve Outcome: Progressing   Problem: Education: Goal: Knowledge of General Education information will improve Description: Including pain rating scale, medication(s)/side effects and non-pharmacologic comfort measures Outcome: Progressing   Problem: Health Behavior/Discharge Planning: Goal: Ability to manage health-related needs will improve Outcome: Progressing   Problem: Clinical Measurements: Goal: Ability to maintain clinical measurements within normal limits will improve Outcome: Progressing Goal: Will remain free from infection Outcome: Progressing Goal: Diagnostic test results will improve Outcome: Progressing Goal: Respiratory complications will improve Outcome: Progressing Goal: Cardiovascular complication will be avoided Outcome: Progressing   Problem: Activity: Goal: Risk for activity intolerance will decrease Outcome: Progressing   Problem: Nutrition: Goal: Adequate nutrition will be maintained Outcome: Progressing   Problem: Coping: Goal: Level of anxiety will decrease Outcome: Progressing   Problem: Elimination: Goal: Will not experience complications related to bowel motility Outcome: Progressing Goal: Will not experience complications related to urinary retention Outcome: Progressing   Problem: Pain Managment: Goal: General experience of comfort will improve Outcome:  Progressing   Problem: Safety: Goal: Ability to remain free from injury will improve Outcome: Progressing   Problem: Skin Integrity: Goal: Risk for impaired skin integrity will decrease Outcome: Progressing   Problem: Education: Goal: Ability to demonstrate management of disease process will improve Outcome: Progressing Goal: Ability to verbalize understanding of medication therapies will improve Outcome: Progressing Goal: Individualized Educational Video(s) Outcome: Progressing   Problem: Activity: Goal: Capacity to carry out activities will improve Outcome: Progressing   Problem: Cardiac: Goal: Ability to achieve and maintain adequate cardiopulmonary perfusion will improve Outcome: Progressing   Problem: Education: Goal: Understanding of CV disease, CV risk reduction, and recovery process will improve Outcome: Progressing Goal: Individualized Educational Video(s) Outcome: Progressing   Problem: Activity: Goal: Ability to return to baseline activity level will improve Outcome: Progressing   Problem: Cardiovascular: Goal: Ability to achieve and maintain adequate cardiovascular perfusion will improve Outcome: Progressing Goal: Vascular access site(s) Level 0-1 will be maintained Outcome: Progressing   Problem: Health Behavior/Discharge Planning: Goal: Ability to safely manage health-related needs after discharge will improve Outcome: Progressing

## 2022-01-27 NOTE — Progress Notes (Signed)
Physical Therapy Treatment Patient Details Name: [name] MRN: [id number] DOB: Jul 26, 1940 Today's Date: 01/27/2022   History of Present Illness The pt is an 81 yo male presenting 7/15 with SOB and possible new onset afib. Upon work up, suspect multifocal atrial tachycardia complicated by persistent SOB with hypoxia. PMH includes: CAD s/p CABG, COPD, HLD, and DM II. CT cervical spine 2018: Severe degenerative disc disease at C6-7    PT Comments    Pt with significant improvement since last seen. Expect he will continue to progress back toward his baseline. Pt does not want a rollator after trying it and prefers to use equipment he already has at home. Will continue to follow. Will try a straight cane next visit.    Recommendations for follow up therapy are one component of a multi-disciplinary discharge planning process, led by the attending physician.  Recommendations may be updated based on patient status, additional functional criteria and insurance authorization.  Follow Up Recommendations  Home health PT     Assistance Recommended at Discharge Intermittent Supervision/Assistance  Patient can return home with the following A little help with walking and/or transfers;A little help with bathing/dressing/bathroom;Assistance with cooking/housework;Assistance with feeding;Direct supervision/assist for medications management;Direct supervision/assist for financial management;Assist for transportation;Help with stairs or ramp for entrance   Equipment Recommendations  None recommended by PT    Recommendations for Other Services       Precautions / Restrictions Precautions Precautions: Fall Restrictions Weight Bearing Restrictions: No     Mobility  Bed Mobility Overal bed mobility: Needs Assistance Bed Mobility: Supine to Sit     Supine to sit: Min assist     General bed mobility comments: Assist to elevate trunk into sitting    Transfers Overall transfer level: Needs  assistance Equipment used: Rolling walker (2 wheels) Transfers: Sit to/from Stand Sit to Stand: Min assist           General transfer comment: Assist to bring hips up and stabilize    Ambulation/Gait Ambulation/Gait assistance: Min guard, Min assist Gait Distance (Feet): 300 Feet Assistive device: Rolling walker (2 wheels), Rollator (4 wheels), None Gait Pattern/deviations: Step-through pattern, Decreased stride length Gait velocity: Initially min assist for balance but improved to min guard with incr distance. Pt used rolling walker and then rollator. Does not want rollator for home. He states he has rolling walker at home in garage. In the room at end of session he amb short distance without assistive device. Gait velocity interpretation: 1.31 - 2.62 ft/sec, indicative of limited community ambulator   General Gait Details: deferred due to RN placing breathing tx, pt able to complete x5 standing marches at EOB with modA, posterior lean   Stairs             Wheelchair Mobility    Modified Rankin (Stroke Patients Only)       Balance Overall balance assessment: Needs assistance Sitting-balance support: Bilateral upper extremity supported, Feet supported Sitting balance-Leahy Scale: Good     Standing balance support: No upper extremity supported, During functional activity Standing balance-Leahy Scale: Fair                              Cognition Arousal/Alertness: Awake/alert Behavior During Therapy: WFL for tasks assessed/performed Overall Cognitive Status: Within Functional Limits for tasks assessed  Exercises      General Comments General comments (skin integrity, edema, etc.): VSS on RA      Pertinent Vitals/Pain Pain Assessment Pain Assessment: No/denies pain    Home Living                          Prior Function            PT Goals (current goals can now be found  in the care plan section) Acute Rehab PT Goals Patient Stated Goal: return home Progress towards PT goals: Progressing toward goals    Frequency    Min 3X/week      PT Plan Current plan remains appropriate    Co-evaluation              AM-PAC PT "6 Clicks" Mobility   Outcome Measure  Help needed turning from your back to your side while in a flat bed without using bedrails?: A Little Help needed moving from lying on your back to sitting on the side of a flat bed without using bedrails?: A Little Help needed moving to and from a bed to a chair (including a wheelchair)?: A Little Help needed standing up from a chair using your arms (e.g., wheelchair or bedside chair)?: A Little Help needed to walk in hospital room?: A Little Help needed climbing 3-5 steps with a railing? : A Little 6 Click Score: 18    End of Session   Activity Tolerance: Patient tolerated treatment well Patient left: in chair;with call bell/phone within reach;with family/visitor present Nurse Communication: Mobility status PT Visit Diagnosis: Other abnormalities of gait and mobility (R26.89);Muscle weakness (generalized) (M62.81)     Time: 7121-9758 PT Time Calculation (min) (ACUTE ONLY): 21 min  Charges:  $Gait Training: 8-22 mins                     [place] [facility] [place] 01/27/2022, 4:27 PM

## 2022-01-27 NOTE — Progress Notes (Addendum)
Progress Note  Patient Name: [name] Date of Encounter: 01/27/2022  [facility] Cardiologist: Mertie Moores, MD   Subjective   Patient feeling well today. Denies chest pain, palpitations. Has SOB on exertion. Denies orthopnea. Right femoral cath site is stable and without tenderness or bleeding. Denies pelvic pain or back pain. Hemoglobin stable   Inpatient Medications    Scheduled Meds:  aspirin  81 mg Oral Daily   cholecalciferol  5,000 Units Oral Daily   clopidogrel  75 mg Oral Daily   empagliflozin  10 mg Oral Daily   enoxaparin (LOVENOX) injection  40 mg Subcutaneous Q24H   ezetimibe  10 mg Oral Daily   furosemide  40 mg Intravenous BID   isosorbide mononitrate  30 mg Oral Daily   multivitamin with minerals  1 tablet Oral Daily   sacubitril-valsartan  1 tablet Oral BID   sodium chloride flush  3 mL Intravenous Q12H   Continuous Infusions:  sodium chloride     PRN Meds: sodium chloride, acetaminophen **OR** acetaminophen, acetaminophen, albuterol, diazepam, ondansetron (ZOFRAN) IV, ondansetron **OR** [DISCONTINUED] ondansetron (ZOFRAN) IV, senna-docusate, sodium chloride flush   Vital Signs    Vitals:   01/26/22 2000 01/26/22 2015 01/27/22 0444 01/27/22 0454  BP: 125/72 122/70 112/73   Pulse:   84   Resp: 20  15   Temp: 97.6 F (36.4 C)  97.6 F (36.4 C)   TempSrc: Oral  Oral   SpO2:   99% 100%  Weight:   62.8 kg   Height:        Intake/Output Summary (Last 24 hours) at 01/27/2022 0956 Last data filed at 01/27/2022 0911 Gross per 24 hour  Intake 576 ml  Output 2200 ml  Net -1624 ml      01/27/2022    4:44 AM 01/26/2022    4:34 AM 01/25/2022    6:36 AM  Last 3 Weights  Weight (lbs) 138 lb 8 oz 138 lb 6.4 oz 141 lb 5 oz  Weight (kg) 62.823 kg 62.778 kg 64.1 kg      Telemetry    Sinus rhythm with PVCs, one 7-beat run of NSVT - Personally Reviewed  ECG    No new tracings - Personally Reviewed  Physical Exam   GEN: Frail, elderly  male resting comfortably in the bed in no acute distress  Neck: No JVD Cardiac: RRR, grade 2/6 systolic murmur at apex. Radial pulses 2+ bilaterally.  Right femoral cath site is soft and nontender, no bleeding.  Respiratory: Clear to auscultation bilaterally with decrease breath sounds at bilateral lung bases  GI: Soft, nontender, non-distended  MS: No edema; No deformity. Neuro:  Nonfocal  Psych: Normal affect   Labs    High Sensitivity Troponin:   Recent Labs  Lab 01/24/22 2016 01/24/22 2215 01/25/22 0939 01/25/22 1141  TROPONINIHS 57* 59* 34* 37*     Chemistry Recent Labs  Lab 01/24/22 1229 01/25/22 0312 01/26/22 0348 01/26/22 1759 01/26/22 1809 01/26/22 1810 01/27/22 0337  NA 137 136 135   < > 136 136 135  K 3.7 3.9 3.7   < > 2.9* 2.9* 3.7  CL 103 101 99  --   --   --  99  CO2 '22 25 29  '$ --   --   --  23  GLUCOSE 145* 147* 147*  --   --   --  142*  BUN '13 12 13  '$ --   --   --  13  CREATININE  0.79 0.90 1.00  --   --   --  0.73  CALCIUM 9.1 8.9 8.8*  --   --   --  8.4*  MG 1.5* 2.0 1.9  --   --   --   --   GFRNONAA >60 >60 >60  --   --   --  >60  ANIONGAP '12 10 7  '$ --   --   --  13   < > = values in this interval not displayed.    Lipids No results for input(s): "CHOL", "TRIG", "HDL", "LABVLDL", "LDLCALC", "CHOLHDL" in the last 168 hours.  Hematology Recent Labs  Lab 01/25/22 0312 01/26/22 0348 01/26/22 1759 01/26/22 1809 01/26/22 1810 01/27/22 0337  WBC 6.2 7.0  --   --   --  6.4  RBC 3.58* 3.80*  --   --   --  3.68*  HGB 11.6* 12.1*   < > 11.2* 11.6* 11.8*  HCT 32.8* 35.7*   < > 33.0* 34.0* 33.8*  MCV 91.6 93.9  --   --   --  91.8  MCH 32.4 31.8  --   --   --  32.1  MCHC 35.4 33.9  --   --   --  34.9  RDW 13.5 13.5  --   --   --  13.4  PLT 141* 139*  --   --   --  92*   < > = values in this interval not displayed.   Thyroid  Recent Labs  Lab 01/24/22 2016  TSH 2.684    BNP Recent Labs  Lab 01/24/22 1229  BNP 1,092.3*    DDimer No results  for input(s): "DDIMER" in the last 168 hours.   Radiology    CARDIAC CATHETERIZATION  Result Date: 01/26/2022   1st Diag lesion is 90% stenosed.   Prox RCA to Mid RCA lesion is 100% stenosed.   Origin to Prox Graft lesion is 100% stenosed.   Prox Cx to Mid Cx lesion is 90% stenosed.   2nd Mrg lesion is 90% stenosed.   Lat 2nd Mrg lesion is 95% stenosed.   Origin to Prox Graft lesion is 100% stenosed. Severe native coronary artery disease with significant coronary calcification.  The LAD is totally occluded after the first diagonal vessel and the first diagonal vessel has 90% ostial stenosis. Left circumflex vessel has a 90% focal proximal stenosis and 90% stenosis in the marginal branch with distal 95% branch stenosis. Right coronary artery is totally occluded proximally. The LIMA graft supplying the LAD is widely patent. The vein graft supplying the distal RCA is patent.  There is irregularity of the distal PD/PLA vasculature. The vein graft which had supplied the diagonal vessel is totally occluded. (Old) The vein graft had supplied the circumflex vessel is totally occluded. (Old) Mildly elevated right heart pressures with mild pulmonary venous hypertension and PA pressure at 30 mmHg. RECOMMENDATION: Patient will be undergoing TEE for further evaluation of mitral valve and valvular apparatus with concern for possible torn chordae on the transthoracic echo.  We will have colleagues review angiographic images with high-grade circumflex stenoses.   ECHOCARDIOGRAM COMPLETE  Result Date: 01/25/2022    ECHOCARDIOGRAM REPORT   Patient Name:   [name] [facility] Date of Exam: 01/25/2022 Medical Rec #:  [id number]        Height:       69.0 in Accession #:    [id number]       Weight:  141.3 lb Date of Birth:  Feb 25, 1941        BSA:          1.782 m Patient Age:    81 years         BP:           143/95 mmHg Patient Gender: M                HR:           87 bpm. Exam Location:  Inpatient Procedure: 2D Echo  Indications:    dyspnea  History:        Patient has prior history of Echocardiogram examinations, most                 recent 12/16/2020. CHF, CAD, COPD, Arrythmias:PAC, PVC and Atrial                 Fibrillation; Risk Factors:Dyslipidemia, Hypertension and                 Diabetes.  Sonographer:    Johny Chess RDCS Referring Phys: [id number] NISCHAL NARENDRA IMPRESSIONS  1. TTE with newly reduced LVEF of <20%, multiple wall motion abnormalities and at least moderate-to-severe MR with concern for a torn chordae. Recommend cath and TEE for further evaluation.  2. Left ventricular ejection fraction, by estimation, is <20%. The left ventricle has severely decreased function. The left ventricle demonstrates regional wall motion abnormalities (see scoring diagram/findings for description). There is severe global hypokinesis which appears worse in the anterior, septal and apical segments. Left ventricular diastolic parameters are consistent with Grade II diastolic dysfunction (pseudonormalization). Recommend cath for further work-up.  3. There is a highly mobile, echobright density noted in the LV in the subvalvular apparatus most concerning for a torn chordae given degree of mitral regurgitation that has significantly progressed from prior study. Recommend TEE for further evaluation.  4. Right ventricular systolic function is mildly reduced. The right ventricular size is normal. There is severely elevated pulmonary artery systolic pressure. The estimated right ventricular systolic pressure is 08.6 mmHg.  5. Left atrial size was moderately dilated.  6. The mitral valve is abnormal. Supect there is a torn chordae with at least moderate-to-severe mitral regurgitation. By color flow doppler, the degree of mitral regurgitation appears more moderate-to-severe, however, there is systolic flow reversal in  the pulmonary vein which is more suggestive of severe mitral regurgitation. Recommend TEE for further evaluation as  above.  7. Tricuspid valve regurgitation is mild to moderate.  8. The aortic valve is tricuspid. There is mild calcification of the aortic valve. There is mild thickening of the aortic valve. Aortic valve regurgitation is not visualized. Aortic valve sclerosis/calcification is present, without any evidence of aortic stenosis.  9. Aortic dilatation noted. There is borderline dilatation of the ascending aorta, measuring 38 mm. 10. The inferior vena cava is normal in size with greater than 50% respiratory variability, suggesting right atrial pressure of 3 mmHg. Comparison(s): Compared to prior TTE on 12/16/20, the LVEF is now severely reduced (previously 50-55%) with wall motion abnormalities as described above. There is now at least moderate-to-severe MR with concern for a torn chordae. Recommend cath and TEE for further work-up. FINDINGS  Left Ventricle: There is a highly mobile, echobright density noted in the LV in the subvalvular apparatus that is most concerning for a torn chordae given degree of mitral valve regurgitation. Recommend TEE for further evaluation. Left ventricular ejection fraction, by estimation, is <20%. The left  ventricle has severely decreased function. The left ventricle demonstrates regional wall motion abnormalities. The left ventricular internal cavity size was normal in size. There is no left ventricular hypertrophy. Left ventricular diastolic parameters are consistent with Grade II diastolic dysfunction (pseudonormalization). Right Ventricle: The right ventricular size is normal. No increase in right ventricular wall thickness. Right ventricular systolic function is mildly reduced. There is severely elevated pulmonary artery systolic pressure. The tricuspid regurgitant velocity is 4.40 m/s, and with an assumed right atrial pressure of 3 mmHg, the estimated right ventricular systolic pressure is 23.5 mmHg. Left Atrium: Left atrial size was moderately dilated. Right Atrium: Right atrial size  was normal in size. Pericardium: There is no evidence of pericardial effusion. Mitral Valve: The mitral valve is abnormal. There is mild thickening of the mitral valve leaflet(s). There is mild calcification of the mitral valve leaflet(s). Mild mitral annular calcification. Moderate to severe mitral valve regurgitation. Tricuspid Valve: The tricuspid valve is normal in structure. Tricuspid valve regurgitation is mild to moderate. Aortic Valve: The aortic valve is tricuspid. There is mild calcification of the aortic valve. There is mild thickening of the aortic valve. Aortic valve regurgitation is not visualized. Aortic valve sclerosis/calcification is present, without any evidence of aortic stenosis. Pulmonic Valve: The pulmonic valve was normal in structure. Pulmonic valve regurgitation is trivial. Aorta: Aortic dilatation noted. There is borderline dilatation of the ascending aorta, measuring 38 mm. Venous: The inferior vena cava is normal in size with greater than 50% respiratory variability, suggesting right atrial pressure of 3 mmHg. IAS/Shunts: The atrial septum is grossly normal.  LEFT VENTRICLE PLAX 2D LVIDd:         5.60 cm LVIDs:         5.10 cm LV PW:         1.00 cm LV IVS:        1.00 cm LVOT diam:     2.50 cm LV SV:         68 LV SV Index:   38 LVOT Area:     4.91 cm  RIGHT VENTRICLE         IVC TAPSE (M-mode): 1.6 cm  IVC diam: 1.80 cm LEFT ATRIUM             Index        RIGHT ATRIUM           Index LA diam:        5.00 cm 2.81 cm/m   RA Area:     15.80 cm LA Vol (A2C):   80.2 ml 44.99 ml/m  RA Volume:   42.20 ml  23.67 ml/m LA Vol (A4C):   76.1 ml 42.69 ml/m LA Biplane Vol: 80.6 ml 45.22 ml/m  AORTIC VALVE LVOT Vmax:   74.50 cm/s LVOT Vmean:  48.000 cm/s LVOT VTI:    0.138 m  AORTA Ao Root diam: 3.40 cm Ao Asc diam:  3.80 cm TRICUSPID VALVE TR Peak grad:   77.4 mmHg TR Vmax:        440.00 cm/s  SHUNTS Systemic VTI:  0.14 m Systemic Diam: 2.50 cm Gwyndolyn Kaufman MD Electronically signed by  Gwyndolyn Kaufman MD Signature Date/Time: 01/25/2022/11:52:10 AM    Final     Cardiac Studies   Echocardiogram 01/25/22  1. TTE with newly reduced LVEF of <20%, multiple wall motion  abnormalities and at least moderate-to-severe MR with concern for a torn  chordae. Recommend cath and TEE for further evaluation.   2. Left ventricular  ejection fraction, by estimation, is <20%. The left  ventricle has severely decreased function. The left ventricle demonstrates  regional wall motion abnormalities (see scoring diagram/findings for  description). There is severe global  hypokinesis which appears worse in the anterior, septal and apical  segments. Left ventricular diastolic parameters are consistent with Grade  II diastolic dysfunction (pseudonormalization). Recommend cath for further  work-up.   3. There is a highly mobile, echobright density noted in the LV in the  subvalvular apparatus most concerning for a torn chordae given degree of  mitral regurgitation that has significantly progressed from prior study.  Recommend TEE for further  evaluation.   4. Right ventricular systolic function is mildly reduced. The right  ventricular size is normal. There is severely elevated pulmonary artery  systolic pressure. The estimated right ventricular systolic pressure is  97.3 mmHg.   5. Left atrial size was moderately dilated.   6. The mitral valve is abnormal. Supect there is a torn chordae with at  least moderate-to-severe mitral regurgitation. By color flow doppler, the  degree of mitral regurgitation appears more moderate-to-severe, however,  there is systolic flow reversal in   the pulmonary vein which is more suggestive of severe mitral  regurgitation. Recommend TEE for further evaluation as above.   7. Tricuspid valve regurgitation is mild to moderate.   8. The aortic valve is tricuspid. There is mild calcification of the  aortic valve. There is mild thickening of the aortic valve. Aortic  valve  regurgitation is not visualized. Aortic valve sclerosis/calcification is  present, without any evidence of  aortic stenosis.   9. Aortic dilatation noted. There is borderline dilatation of the  ascending aorta, measuring 38 mm.  10. The inferior vena cava is normal in size with greater than 50%  respiratory variability, suggesting right atrial pressure of 3 mmHg.   Comparison(s): Compared to prior TTE on 12/16/20, the LVEF is now severely  reduced (previously 50-55%) with wall motion abnormalities as described  above. There is now at least moderate-to-severe MR with concern for a torn  chordae. Recommend cath and TEE  for further work-up.   Patient Profile     81 y.o. male with CAD s/p CABG with PCI in 2007 subsequently, hypertension diabetes chronic diastolic heart failure with newly diagnosed systolic heart failure with a EF less than 20% and moderate to severe mitral regurgitation.  Sees Dr. Acie Fredrickson as outpatient.  Assessment & Plan    Severe mitral regurgitation Acute on chronic systolic heart failure - Echocardiogram from May 2022 showed EF 50% with mild to moderate MR.   -- Patient presented complaining of progressive weakness, shortness of breath -- Repeat echo this admission showed ejection fraction less than 20%, multiple wall motion abnormalities, moderate-severe MR with concern for a torn chordae, severely elevated pulmonary artery pressure - Underwent R/L heart cath yesterday that showed severe native CAD with significant coronary calcification, total occlusions of the SVG-diag, SVG-LCX, patent LIMA and SVG-RCA. No targets for intervention. Mildly elevated right heart pressures with mild pulmonary venous HTN and PA pressure at 30 mmHg.  -TEE to follow on Wednesday, Dr. Harriet Masson for further evaluation of mitral valve. Orders placed, patient NPO at midnight.  -Lasix currently 40 mg IV twice daily.  Output 1.5 L urine yesterday and is currently neg -5.4 L since admission. Renal  function stable. K has been low this admission, but now improved to 3.7  -Off of metoprolol given significant reduced EF. - BP somewhat soft-- hold entresto for  now. Can attempt to resume when patient is off IV lasix if BP can tolerate  -- Continue jardiance 10 mg daily    CAD status post CABG - With significant drop in EF, underwent cath yesterday to redefine anatomy. Findings as above  --Patient without chest pain  -Currently on ASA, Plavix -Continue with Zetia 10.  Did not tolerate statins --Metoprolol held due to significantly reduced EF    Statin intolerance - On Zetia     For questions or updates, please contact [place] Please consult [email or web address] for contact info under        Signed, Margie Billet, PA-C  01/27/2022, 9:56 AM    Personally seen and examined. Agree with above.  Blood pressure has been a little bit soft.  We tried to start [facility] yesterday but all doses were held.  We will go ahead and take this off of his medication list and potentially rehab attempt at another date.  For now, continue with IV Lasix as tolerated.  His wedge pressure was high on cardiac catheterization personally reviewed.  Renal function is stable.  Appreciate Dr. Claiborne Billings with heart catheterization.  Continuing with medical management.  I do not think that intervention to native circumflex would be extremely helpful.  On aspirin Plavix Zetia.  Plan for TEE tomorrow.  Risk and benefits of been explained including stroke heart attack death renal impairment bleeding.  He is willing to proceed.  This will be helpful to look at moderate to severe mitral regurgitation.  There is question for ruptured cordae.  Candee Furbish, MD

## 2022-01-27 NOTE — Progress Notes (Signed)
Subjective:   Hospital day 2.  Interval events: RHC/LHC.  Mr. Wenrich is feeling well today.  He is in good spirits.  He denies shortness of breath.  Groin cath site is not bothering him.  Objective:  Vital signs: Blood pressure 108/68, pulse 85, temperature 98 F (36.7 C), temperature source Oral, resp. rate (!) 22, height '5\' 9"'$  (1.753 m), weight 62.8 kg, SpO2 95 %.  Physical exam: Constitutional: Elderly male lying in bed.  In no apparent distress. Cardiovascular: Irregular.  Soft murmur at apex. Pulmonary: Normal work of breathing noted.  Clear anterior lung fields. Skin: Warm and dry. Extremities: Right radial pulse 2+. Neuro: Certain oriented. Psych: Pleasant mood and affect.   Intake/Output Summary (Last 24 hours) at 01/27/2022 1521 Last data filed at 01/27/2022 1216 Gross per 24 hour  Intake 480 ml  Output 3000 ml  Net -2520 ml   Pertinent Labs:    Latest Ref Rng & Units 01/27/2022    3:37 AM 01/26/2022    6:10 PM 01/26/2022    6:09 PM  CBC  WBC 4.0 - 10.5 K/uL 6.4     Hemoglobin 13.0 - 17.0 g/dL 11.8  11.6  11.2   Hematocrit 39.0 - 52.0 % 33.8  34.0  33.0   Platelets 150 - 400 K/uL 92          Latest Ref Rng & Units 01/27/2022    3:37 AM 01/26/2022    6:10 PM 01/26/2022    6:09 PM  CMP  Glucose 70 - 99 mg/dL 142     BUN 8 - 23 mg/dL 13     Creatinine 0.61 - 1.24 mg/dL 0.73     Sodium 135 - 145 mmol/L 135  136  136   Potassium 3.5 - 5.1 mmol/L 3.7  2.9  2.9   Chloride 98 - 111 mmol/L 99     CO2 22 - 32 mmol/L 23     Calcium 8.9 - 10.3 mg/dL 8.4      Assessment/Plan:   Principal Problem:   Acute exacerbation of CHF (congestive heart failure) (HCC) Active Problems:   Shortness of breath   Multifocal atrial tachycardia (HCC)   Acute respiratory failure with hypoxia (HCC)   Mitral regurgitation   Patient Summary: PRICE LACHAPELLE is a 81 y.o. with a PMH of coronary artery disease status post CABG, who presented with shortness of  breath and weakness and was admitted for severe mitral regurgitation and acute systolic heart failure.   Severe mitral regurgitation Acute on chronic systolic heart failure Hypoxic respiratory failure, improving Patient is intermittently on and off 2 L of oxygen.  Last PT session with SPO2 to nadir of 88% on 2 L with standing march.  Will reevaluate for O2 requirement prior to discharge.  Right heart cath with mildly elevated right heart pressures, mild pulmonary venous hypertension, PA pressure 30 mmHg.  TEE tomorrow for evaluation of mitral valve.  We will continue diuresis while closely monitoring electrolytes and kidney function.  We will initiate GDMT cautiously given borderline blood pressure and severely reduced EF. - Continue Lasix 40 mg IV twice daily - Continue Jardiance 10 mg daily. - Continue continue Imdur 30 mg daily for afterload reduction. - Continue holding metoprolol and Entresto. - Daily BMP. - Follow-up a.m. mag - I/O monitoring. - Daily weights. - Telemetry.  CAD status post CABG LHC showed severe native coronary artery  disease.  Total occlusions of SVG diagonal, SVG left circumflex.  Patent LIMA and SVG right coronary artery.  Currently on DAPT. - Aspirin 81 mg daily - Plavix 75 mg daily - Ezetimibe 10 mg daily  Constipation - Continue senna docusate 1 tablet as needed for constipation  Vitamin D deficiency - Continue cholecalciferol 5000 units daily  ITP Platelets 90s today.  We will recheck CBC tomorrow  Transitions of care PT OT following.  Appreciate their input.  Diet:  NPO at midnight for TEE IVF: None,None VTE: Enoxaparin Code: Full PT/OT recs: Home Health. Family Update: Family present at bedside   Dispo: Anticipated discharge to Home in 2 days pending evaluation and treatment of heart failure secondary to mitral regurgitation.   Nani Gasser, MD 01/27/2022, 3:21 PM  Pager: [phone] After 5pm on weekdays and 1pm on weekends: On Call  pager: [phone]

## 2022-01-28 ENCOUNTER — Encounter (HOSPITAL_COMMUNITY)
Admission: EM | Disposition: A | Discharge status: Home Health | Payer: Self-pay | Source: Home / Self Care | Attending: Student in an Organized Health Care Education/Training Program

## 2022-01-28 ENCOUNTER — Inpatient Hospital Stay (HOSPITAL_COMMUNITY): Payer: Medicare HMO | Admitting: Anesthesiology

## 2022-01-28 ENCOUNTER — Encounter (HOSPITAL_COMMUNITY): Payer: Self-pay | Admitting: Internal Medicine

## 2022-01-28 ENCOUNTER — Inpatient Hospital Stay (HOSPITAL_COMMUNITY): Payer: Medicare HMO

## 2022-01-28 DIAGNOSIS — I340 Nonrheumatic mitral (valve) insufficiency: Secondary | ICD-10-CM | POA: Diagnosis not present

## 2022-01-28 DIAGNOSIS — I2510 Atherosclerotic heart disease of native coronary artery without angina pectoris: Secondary | ICD-10-CM | POA: Diagnosis not present

## 2022-01-28 DIAGNOSIS — I5032 Chronic diastolic (congestive) heart failure: Secondary | ICD-10-CM

## 2022-01-28 DIAGNOSIS — I5043 Acute on chronic combined systolic (congestive) and diastolic (congestive) heart failure: Secondary | ICD-10-CM | POA: Diagnosis not present

## 2022-01-28 DIAGNOSIS — I110 Hypertensive heart disease with heart failure: Secondary | ICD-10-CM | POA: Diagnosis not present

## 2022-01-28 HISTORY — PX: TEE WITHOUT CARDIOVERSION: SHX5443

## 2022-01-28 LAB — BASIC METABOLIC PANEL
Anion gap: 7 (ref 5–15)
BUN: 11 mg/dL (ref 8–23)
CO2: 26 mmol/L (ref 22–32)
Calcium: 8.4 mg/dL — ABNORMAL LOW (ref 8.9–10.3)
Chloride: 103 mmol/L (ref 98–111)
Creatinine, Ser: 0.72 mg/dL (ref 0.61–1.24)
GFR, Estimated: >60 mL/min (ref >60)
Glucose, Bld: 155 mg/dL — ABNORMAL HIGH (ref 70–99)
Potassium: 2.9 mmol/L — ABNORMAL LOW (ref 3.5–5.1)
Sodium: 136 mmol/L (ref 135–145)

## 2022-01-28 LAB — CBC
HCT: 36 % — ABNORMAL LOW (ref 39.0–52.0)
Hemoglobin: 12.5 g/dL — ABNORMAL LOW (ref 13.0–17.0)
MCH: 31.7 pg (ref 26.0–34.0)
MCHC: 34.7 g/dL (ref 30.0–36.0)
MCV: 91.4 fL (ref 80.0–100.0)
Platelets: 128 10*3/uL — ABNORMAL LOW (ref 150–400)
RBC: 3.94 MIL/uL — ABNORMAL LOW (ref 4.22–5.81)
RDW: 13.3 % (ref 11.5–15.5)
WBC: 6.1 10*3/uL (ref 4.0–10.5)
nRBC: 0 % (ref 0.0–0.2)

## 2022-01-28 LAB — ECHO TEE
MV M vel: 4.53 m/s
MV Peak grad: 82.2 mmHg
Radius: 0.6 cm

## 2022-01-28 LAB — GLUCOSE, CAPILLARY: Glucose-Capillary: 165 mg/dL — ABNORMAL HIGH (ref 70–99)

## 2022-01-28 LAB — LIPOPROTEIN A (LPA): Lipoprotein (a): 12.1 nmol/L (ref <75.0)

## 2022-01-28 LAB — MAGNESIUM: Magnesium: 2 mg/dL (ref 1.7–2.4)

## 2022-01-28 SURGERY — ECHOCARDIOGRAM, TRANSESOPHAGEAL
Anesthesia: Monitor Anesthesia Care

## 2022-01-28 MED ORDER — POTASSIUM CHLORIDE 10 MEQ/100ML IV SOLN
10.0000 meq | INTRAVENOUS | Status: AC
  Administered 2022-01-28 (×3): 10 meq via INTRAVENOUS
  Filled 2022-01-28 (×3): qty 100

## 2022-01-28 MED ORDER — PHENYLEPHRINE HCL-NACL 20-0.9 MG/250ML-% IV SOLN
INTRAVENOUS | Status: DC | PRN
  Administered 2022-01-28: 30 ug/min via INTRAVENOUS

## 2022-01-28 MED ORDER — FUROSEMIDE 10 MG/ML IJ SOLN
40.0000 mg | Freq: Every day | INTRAMUSCULAR | Status: DC
Start: 2022-01-28 — End: 2022-01-28

## 2022-01-28 MED ORDER — PHENOL 1.4 % MT LIQD
1.0000 | OROMUCOSAL | Status: DC | PRN
  Administered 2022-01-28: 1 via OROMUCOSAL
  Filled 2022-01-28: qty 177

## 2022-01-28 MED ORDER — FENTANYL CITRATE (PF) 100 MCG/2ML IJ SOLN
INTRAMUSCULAR | Status: DC | PRN
  Administered 2022-01-28 (×4): 25 ug via INTRAVENOUS

## 2022-01-28 MED ORDER — LIDOCAINE 2% (20 MG/ML) 5 ML SYRINGE
INTRAMUSCULAR | Status: DC | PRN
  Administered 2022-01-28: 100 mg via INTRAVENOUS

## 2022-01-28 MED ORDER — PROPOFOL 500 MG/50ML IV EMUL
INTRAVENOUS | Status: DC | PRN
  Administered 2022-01-28: 50 ug/kg/min via INTRAVENOUS

## 2022-01-28 MED ORDER — PROPOFOL 10 MG/ML IV BOLUS
INTRAVENOUS | Status: DC | PRN
  Administered 2022-01-28: 20 mg via INTRAVENOUS
  Administered 2022-01-28 (×2): 10 mg via INTRAVENOUS

## 2022-01-28 MED ORDER — EPHEDRINE SULFATE-NACL 50-0.9 MG/10ML-% IV SOSY
PREFILLED_SYRINGE | INTRAVENOUS | Status: DC | PRN
  Administered 2022-01-28: 5 mg via INTRAVENOUS

## 2022-01-28 MED ORDER — FENTANYL CITRATE (PF) 100 MCG/2ML IJ SOLN
INTRAMUSCULAR | Status: AC
  Filled 2022-01-28: qty 2

## 2022-01-28 MED ORDER — GLYCOPYRROLATE PF 0.2 MG/ML IJ SOSY
PREFILLED_SYRINGE | INTRAMUSCULAR | Status: DC | PRN
  Administered 2022-01-28: .1 mg via INTRAVENOUS

## 2022-01-28 MED ORDER — LACTATED RINGERS IV SOLN
INTRAVENOUS | Status: AC | PRN
  Administered 2022-01-28: 1000 mL via INTRAVENOUS

## 2022-01-28 MED ORDER — POTASSIUM CHLORIDE CRYS ER 20 MEQ PO TBCR
40.0000 meq | EXTENDED_RELEASE_TABLET | Freq: Once | ORAL | Status: AC
Start: 2022-01-28 — End: 2022-01-28
  Administered 2022-01-28: 40 meq via ORAL
  Filled 2022-01-28 (×2): qty 2

## 2022-01-28 MED ORDER — POTASSIUM CHLORIDE 10 MEQ/100ML IV SOLN
10.0000 meq | Freq: Once | INTRAVENOUS | Status: AC
Start: 2022-01-28 — End: 2022-01-28
  Administered 2022-01-28: 10 meq via INTRAVENOUS
  Filled 2022-01-28: qty 100

## 2022-01-28 NOTE — Progress Notes (Signed)
OT Cancellation Note  Patient Details Name: MACHAEL RAINE MRN: [id number] DOB: Sep 09, 1940   Cancelled Treatment:    Reason Eval/Treat Not Completed: Patient at procedure or test/ unavailable.  Ressie Slevin D Brittnay Pigman 01/28/2022, 2:32 PM 01/28/2022  RP, OTR/L  Acute Rehabilitation Services  Office:  [phone]

## 2022-01-28 NOTE — Anesthesia Postprocedure Evaluation (Signed)
Anesthesia Post Note  Patient: [name]  Procedure(s) Performed: TRANSESOPHAGEAL ECHOCARDIOGRAM (TEE)     Patient location during evaluation: PACU Anesthesia Type: MAC Level of consciousness: awake and alert Pain management: pain level controlled Vital Signs Assessment: post-procedure vital signs reviewed and stable Respiratory status: spontaneous breathing, nonlabored ventilation and respiratory function stable Cardiovascular status: blood pressure returned to baseline Postop Assessment: no apparent nausea or vomiting Anesthetic complications: no   No notable events documented.  Last Vitals:  Vitals:   01/28/22 1250 01/28/22 1420  BP:  111/67  Pulse: (!) 106 (!) 108  Resp: (!) 24 18  Temp: 36.7 C 36.7 C  SpO2: 94% 97%    Last Pain:  Vitals:   01/28/22 1420  TempSrc:   PainSc: Asleep                 Marthenia Rolling

## 2022-01-28 NOTE — H&P (View-Only) (Signed)
Progress Note  Patient Name: [name] Date of Encounter: 01/28/2022  [facility] Cardiologist: Mertie Moores, MD   Subjective   Patient walked in the halls with PT and tolerated it very well. Denies chest pain, palpitations, sob. On IV K, some burning in his arm.   Inpatient Medications    Scheduled Meds:  aspirin  81 mg Oral Daily   cholecalciferol  5,000 Units Oral Daily   clopidogrel  75 mg Oral Daily   empagliflozin  10 mg Oral Daily   enoxaparin (LOVENOX) injection  40 mg Subcutaneous Q24H   ezetimibe  10 mg Oral Daily   isosorbide mononitrate  30 mg Oral Daily   multivitamin with minerals  1 tablet Oral Daily   sodium chloride flush  3 mL Intravenous Q12H   Continuous Infusions:  sodium chloride     sodium chloride     potassium chloride     PRN Meds: sodium chloride, acetaminophen **OR** acetaminophen, acetaminophen, albuterol, diazepam, ondansetron (ZOFRAN) IV, ondansetron **OR** [DISCONTINUED] ondansetron (ZOFRAN) IV, senna-docusate, sodium chloride flush   Vital Signs    Vitals:   01/27/22 1331 01/27/22 1539 01/27/22 2008 01/28/22 0427  BP: 108/68 106/75 112/64 113/70  Pulse: 85 89 93 80  Resp: (!) '22 16 17 15  '$ Temp: 98 F (36.7 C)  97.6 F (36.4 C) 97.9 F (36.6 C)  TempSrc: Oral  Oral Axillary  SpO2:  96% 94% 98%  Weight:    58.2 kg  Height:        Intake/Output Summary (Last 24 hours) at 01/28/2022 0835 Last data filed at 01/28/2022 0430 Gross per 24 hour  Intake 480 ml  Output 5350 ml  Net -4870 ml      01/28/2022    4:27 AM 01/27/2022    4:44 AM 01/26/2022    4:34 AM  Last 3 Weights  Weight (lbs) 128 lb 6.4 oz 138 lb 8 oz 138 lb 6.4 oz  Weight (kg) 58.242 kg 62.823 kg 62.778 kg      Telemetry    Sinus rhythm, PVCs and PACs  - Personally Reviewed  ECG    No new tracings since 7/17 - Personally Reviewed  Physical Exam   GEN: No acute distress. Sitting upright in the chair  Neck: No JVD Cardiac: RRR, grade 2/6 systolic  murmur at the apex  Respiratory: Clear to auscultation bilaterally, diminished breath sounds in lung bases  GI: Soft, nontender, non-distended  MS: No edema; No deformity. Neuro:  Nonfocal  Psych: Normal affect   Labs    High Sensitivity Troponin:   Recent Labs  Lab 01/24/22 2016 01/24/22 2215 01/25/22 0939 01/25/22 1141  TROPONINIHS 57* 59* 34* 37*     Chemistry Recent Labs  Lab 01/25/22 0312 01/26/22 0348 01/26/22 1759 01/26/22 1810 01/27/22 0337 01/28/22 0303  NA 136 135   < > 136 135 136  K 3.9 3.7   < > 2.9* 3.7 2.9*  CL 101 99  --   --  99 103  CO2 25 29  --   --  23 26  GLUCOSE 147* 147*  --   --  142* 155*  BUN 12 13  --   --  13 11  CREATININE 0.90 1.00  --   --  0.73 0.72  CALCIUM 8.9 8.8*  --   --  8.4* 8.4*  MG 2.0 1.9  --   --   --  2.0  GFRNONAA >60 >60  --   --  >  60 >60  ANIONGAP 10 7  --   --  13 7   < > = values in this interval not displayed.    Lipids No results for input(s): "CHOL", "TRIG", "HDL", "LABVLDL", "LDLCALC", "CHOLHDL" in the last 168 hours.  Hematology Recent Labs  Lab 01/26/22 0348 01/26/22 1759 01/26/22 1810 01/27/22 0337 01/28/22 0303  WBC 7.0  --   --  6.4 6.1  RBC 3.80*  --   --  3.68* 3.94*  HGB 12.1*   < > 11.6* 11.8* 12.5*  HCT 35.7*   < > 34.0* 33.8* 36.0*  MCV 93.9  --   --  91.8 91.4  MCH 31.8  --   --  32.1 31.7  MCHC 33.9  --   --  34.9 34.7  RDW 13.5  --   --  13.4 13.3  PLT 139*  --   --  92* 128*   < > = values in this interval not displayed.   Thyroid  Recent Labs  Lab 01/24/22 2016  TSH 2.684    BNP Recent Labs  Lab 01/24/22 1229  BNP 1,092.3*    DDimer No results for input(s): "DDIMER" in the last 168 hours.   Radiology    CARDIAC CATHETERIZATION  Result Date: 01/26/2022   1st Diag lesion is 90% stenosed.   Prox RCA to Mid RCA lesion is 100% stenosed.   Origin to Prox Graft lesion is 100% stenosed.   Prox Cx to Mid Cx lesion is 90% stenosed.   2nd Mrg lesion is 90% stenosed.   Lat 2nd Mrg  lesion is 95% stenosed.   Origin to Prox Graft lesion is 100% stenosed. Severe native coronary artery disease with significant coronary calcification.  The LAD is totally occluded after the first diagonal vessel and the first diagonal vessel has 90% ostial stenosis. Left circumflex vessel has a 90% focal proximal stenosis and 90% stenosis in the marginal branch with distal 95% branch stenosis. Right coronary artery is totally occluded proximally. The LIMA graft supplying the LAD is widely patent. The vein graft supplying the distal RCA is patent.  There is irregularity of the distal PD/PLA vasculature. The vein graft which had supplied the diagonal vessel is totally occluded. (Old) The vein graft had supplied the circumflex vessel is totally occluded. (Old) Mildly elevated right heart pressures with mild pulmonary venous hypertension and PA pressure at 30 mmHg. RECOMMENDATION: Patient will be undergoing TEE for further evaluation of mitral valve and valvular apparatus with concern for possible torn chordae on the transthoracic echo.  We will have colleagues review angiographic images with high-grade circumflex stenoses.    Cardiac Studies   Echocardiogram 01/25/22  1. TTE with newly reduced LVEF of <20%, multiple wall motion  abnormalities and at least moderate-to-severe MR with concern for a torn  chordae. Recommend cath and TEE for further evaluation.   2. Left ventricular ejection fraction, by estimation, is <20%. The left  ventricle has severely decreased function. The left ventricle demonstrates  regional wall motion abnormalities (see scoring diagram/findings for  description). There is severe global  hypokinesis which appears worse in the anterior, septal and apical  segments. Left ventricular diastolic parameters are consistent with Grade  II diastolic dysfunction (pseudonormalization). Recommend cath for further  work-up.   3. There is a highly mobile, echobright density noted in the LV in  the  subvalvular apparatus most concerning for a torn chordae given degree of  mitral regurgitation that has significantly progressed from prior  study.  Recommend TEE for further  evaluation.   4. Right ventricular systolic function is mildly reduced. The right  ventricular size is normal. There is severely elevated pulmonary artery  systolic pressure. The estimated right ventricular systolic pressure is  91.6 mmHg.   5. Left atrial size was moderately dilated.   6. The mitral valve is abnormal. Supect there is a torn chordae with at  least moderate-to-severe mitral regurgitation. By color flow doppler, the  degree of mitral regurgitation appears more moderate-to-severe, however,  there is systolic flow reversal in   the pulmonary vein which is more suggestive of severe mitral  regurgitation. Recommend TEE for further evaluation as above.   7. Tricuspid valve regurgitation is mild to moderate.   8. The aortic valve is tricuspid. There is mild calcification of the  aortic valve. There is mild thickening of the aortic valve. Aortic valve  regurgitation is not visualized. Aortic valve sclerosis/calcification is  present, without any evidence of  aortic stenosis.   9. Aortic dilatation noted. There is borderline dilatation of the  ascending aorta, measuring 38 mm.  10. The inferior vena cava is normal in size with greater than 50%  respiratory variability, suggesting right atrial pressure of 3 mmHg.   Comparison(s): Compared to prior TTE on 12/16/20, the LVEF is now severely  reduced (previously 50-55%) with wall motion abnormalities as described  above. There is now at least moderate-to-severe MR with concern for a torn  chordae. Recommend cath and TEE  for further work-up.   R/L Heart Cath 7/17   1st Diag lesion is 90% stenosed.   Prox RCA to Mid RCA lesion is 100% stenosed.   Origin to Prox Graft lesion is 100% stenosed.   Prox Cx to Mid Cx lesion is 90% stenosed.   2nd Mrg  lesion is 90% stenosed.   Lat 2nd Mrg lesion is 95% stenosed.   Origin to Prox Graft lesion is 100% stenosed.   Severe native coronary artery disease with significant coronary calcification.  The LAD is totally occluded after the first diagonal vessel and the first diagonal vessel has 90% ostial stenosis.   Left circumflex vessel has a 90% focal proximal stenosis and 90% stenosis in the marginal branch with distal 95% branch stenosis.   Right coronary artery is totally occluded proximally.   The LIMA graft supplying the LAD is widely patent.   The vein graft supplying the distal RCA is patent.  There is irregularity of the distal PD/PLA vasculature.   The vein graft which had supplied the diagonal vessel is totally occluded. (Old)   The vein graft had supplied the circumflex vessel is totally occluded. (Old)   Mildly elevated right heart pressures with mild pulmonary venous hypertension and PA pressure at 30 mmHg.   RECOMMENDATION: Patient will be undergoing TEE for further evaluation of mitral valve and valvular apparatus with concern for possible torn chordae on the transthoracic echo.  We will have colleagues review angiographic images with high-grade circumflex stenoses.   Diagnostic Dominance: Right   Patient Profile     81 y.o. male with CAD s/p CABG with PCI in 2007 subsequently, hypertension diabetes chronic diastolic heart failure with newly diagnosed systolic heart failure with a EF less than 20% and moderate to severe mitral regurgitation.  Sees Dr. Acie Fredrickson as outpatient.  Assessment & Plan    Severe mitral regurgitation Acute on chronic systolic heart failure - Echocardiogram from May 2022 showed EF 50% with mild to moderate MR.   --  Repeat echo this admission showed ejection fraction less than 20%, multiple wall motion abnormalities, moderate-severe MR with concern for a torn chordae, severely elevated pulmonary artery pressure - Underwent R/L heart cath on 7/17 that  showed severe native CAD with significant coronary calcification, total occlusions of the SVG-diag, SVG-LCX, patent LIMA and SVG-RCA. No targets for intervention. Mildly elevated right heart pressures with mild pulmonary venous HTN and PA pressure at 30 mmHg.  - Scheduled for TEE today with Dr. Harriet Masson for further evaluation of mitral valve - Patient has been receiving lasix 40 mg IV twice daily.  Output 5.4 L urine yesterday and is currently neg -10 L since admission. Renal function stable.  -- K decreased to 2.9 today-- IV K ordered per primary team  -- Hold lasix while K being supplemented  - Off of metoprolol given significantly reduced EF - BP somewhat soft-- hold entresto for now. Can attempt to resume at a future date  -- Continue jardiance 10 mg daily  -- Suggest starting spiro prior to discharge-- can wait until he is off IV K    CAD status post CABG - With significant drop in EF, underwent cath to redefine anatomy. Findings as above  --Patient without chest pain  -Currently on ASA, Plavix -Continue with Zetia 10.  Did not tolerate statins --Metoprolol held due to significantly reduced EF    Statin intolerance - On Zetia      For questions or updates, please contact [place] Please consult [email or web address] for contact info under        Signed, Margie Billet, PA-C  01/28/2022, 8:35 AM    Personally seen and examined. Agree with above.  Awaiting transesophageal echocardiogram today to evaluate mitral valve regurgitation.  Concern for torn cord.  Right months heart cath showed severe native vessel CAD no intervention performed.  Medical management.  Candee Furbish, MD

## 2022-01-28 NOTE — Interval H&P Note (Signed)
History and Physical Interval Note:  01/28/2022 12:57 PM  [name]  has presented today for surgery, with the diagnosis of sever mr.  The various methods of treatment have been discussed with the patient and family. After consideration of risks, benefits and other options for treatment, the patient has consented to  Procedure(s): TRANSESOPHAGEAL ECHOCARDIOGRAM (TEE) (N/A) as a surgical intervention.  The patient's history has been reviewed, patient examined, no change in status, stable for surgery.  I have reviewed the patient's chart and labs.  Questions were answered to the patient's satisfaction.     [name]

## 2022-01-28 NOTE — Progress Notes (Addendum)
Progress Note  Patient Name: [name] Date of Encounter: 01/28/2022  [facility] Cardiologist: Mertie Moores, MD   Subjective   Patient walked in the halls with PT and tolerated it very well. Denies chest pain, palpitations, sob. On IV K, some burning in his arm.   Inpatient Medications    Scheduled Meds:  aspirin  81 mg Oral Daily   cholecalciferol  5,000 Units Oral Daily   clopidogrel  75 mg Oral Daily   empagliflozin  10 mg Oral Daily   enoxaparin (LOVENOX) injection  40 mg Subcutaneous Q24H   ezetimibe  10 mg Oral Daily   isosorbide mononitrate  30 mg Oral Daily   multivitamin with minerals  1 tablet Oral Daily   sodium chloride flush  3 mL Intravenous Q12H   Continuous Infusions:  sodium chloride     sodium chloride     potassium chloride     PRN Meds: sodium chloride, acetaminophen **OR** acetaminophen, acetaminophen, albuterol, diazepam, ondansetron (ZOFRAN) IV, ondansetron **OR** [DISCONTINUED] ondansetron (ZOFRAN) IV, senna-docusate, sodium chloride flush   Vital Signs    Vitals:   01/27/22 1331 01/27/22 1539 01/27/22 2008 01/28/22 0427  BP: 108/68 106/75 112/64 113/70  Pulse: 85 89 93 80  Resp: (!) '22 16 17 15  '$ Temp: 98 F (36.7 C)  97.6 F (36.4 C) 97.9 F (36.6 C)  TempSrc: Oral  Oral Axillary  SpO2:  96% 94% 98%  Weight:    58.2 kg  Height:        Intake/Output Summary (Last 24 hours) at 01/28/2022 0835 Last data filed at 01/28/2022 0430 Gross per 24 hour  Intake 480 ml  Output 5350 ml  Net -4870 ml      01/28/2022    4:27 AM 01/27/2022    4:44 AM 01/26/2022    4:34 AM  Last 3 Weights  Weight (lbs) 128 lb 6.4 oz 138 lb 8 oz 138 lb 6.4 oz  Weight (kg) 58.242 kg 62.823 kg 62.778 kg      Telemetry    Sinus rhythm, PVCs and PACs  - Personally Reviewed  ECG    No new tracings since 7/17 - Personally Reviewed  Physical Exam   GEN: No acute distress. Sitting upright in the chair  Neck: No JVD Cardiac: RRR, grade 2/6 systolic  murmur at the apex  Respiratory: Clear to auscultation bilaterally, diminished breath sounds in lung bases  GI: Soft, nontender, non-distended  MS: No edema; No deformity. Neuro:  Nonfocal  Psych: Normal affect   Labs    High Sensitivity Troponin:   Recent Labs  Lab 01/24/22 2016 01/24/22 2215 01/25/22 0939 01/25/22 1141  TROPONINIHS 57* 59* 34* 37*     Chemistry Recent Labs  Lab 01/25/22 0312 01/26/22 0348 01/26/22 1759 01/26/22 1810 01/27/22 0337 01/28/22 0303  NA 136 135   < > 136 135 136  K 3.9 3.7   < > 2.9* 3.7 2.9*  CL 101 99  --   --  99 103  CO2 25 29  --   --  23 26  GLUCOSE 147* 147*  --   --  142* 155*  BUN 12 13  --   --  13 11  CREATININE 0.90 1.00  --   --  0.73 0.72  CALCIUM 8.9 8.8*  --   --  8.4* 8.4*  MG 2.0 1.9  --   --   --  2.0  GFRNONAA >60 >60  --   --  >  60 >60  ANIONGAP 10 7  --   --  13 7   < > = values in this interval not displayed.    Lipids No results for input(s): "CHOL", "TRIG", "HDL", "LABVLDL", "LDLCALC", "CHOLHDL" in the last 168 hours.  Hematology Recent Labs  Lab 01/26/22 0348 01/26/22 1759 01/26/22 1810 01/27/22 0337 01/28/22 0303  WBC 7.0  --   --  6.4 6.1  RBC 3.80*  --   --  3.68* 3.94*  HGB 12.1*   < > 11.6* 11.8* 12.5*  HCT 35.7*   < > 34.0* 33.8* 36.0*  MCV 93.9  --   --  91.8 91.4  MCH 31.8  --   --  32.1 31.7  MCHC 33.9  --   --  34.9 34.7  RDW 13.5  --   --  13.4 13.3  PLT 139*  --   --  92* 128*   < > = values in this interval not displayed.   Thyroid  Recent Labs  Lab 01/24/22 2016  TSH 2.684    BNP Recent Labs  Lab 01/24/22 1229  BNP 1,092.3*    DDimer No results for input(s): "DDIMER" in the last 168 hours.   Radiology    CARDIAC CATHETERIZATION  Result Date: 01/26/2022   1st Diag lesion is 90% stenosed.   Prox RCA to Mid RCA lesion is 100% stenosed.   Origin to Prox Graft lesion is 100% stenosed.   Prox Cx to Mid Cx lesion is 90% stenosed.   2nd Mrg lesion is 90% stenosed.   Lat 2nd Mrg  lesion is 95% stenosed.   Origin to Prox Graft lesion is 100% stenosed. Severe native coronary artery disease with significant coronary calcification.  The LAD is totally occluded after the first diagonal vessel and the first diagonal vessel has 90% ostial stenosis. Left circumflex vessel has a 90% focal proximal stenosis and 90% stenosis in the marginal branch with distal 95% branch stenosis. Right coronary artery is totally occluded proximally. The LIMA graft supplying the LAD is widely patent. The vein graft supplying the distal RCA is patent.  There is irregularity of the distal PD/PLA vasculature. The vein graft which had supplied the diagonal vessel is totally occluded. (Old) The vein graft had supplied the circumflex vessel is totally occluded. (Old) Mildly elevated right heart pressures with mild pulmonary venous hypertension and PA pressure at 30 mmHg. RECOMMENDATION: Patient will be undergoing TEE for further evaluation of mitral valve and valvular apparatus with concern for possible torn chordae on the transthoracic echo.  We will have colleagues review angiographic images with high-grade circumflex stenoses.    Cardiac Studies   Echocardiogram 01/25/22  1. TTE with newly reduced LVEF of <20%, multiple wall motion  abnormalities and at least moderate-to-severe MR with concern for a torn  chordae. Recommend cath and TEE for further evaluation.   2. Left ventricular ejection fraction, by estimation, is <20%. The left  ventricle has severely decreased function. The left ventricle demonstrates  regional wall motion abnormalities (see scoring diagram/findings for  description). There is severe global  hypokinesis which appears worse in the anterior, septal and apical  segments. Left ventricular diastolic parameters are consistent with Grade  II diastolic dysfunction (pseudonormalization). Recommend cath for further  work-up.   3. There is a highly mobile, echobright density noted in the LV in  the  subvalvular apparatus most concerning for a torn chordae given degree of  mitral regurgitation that has significantly progressed from prior  study.  Recommend TEE for further  evaluation.   4. Right ventricular systolic function is mildly reduced. The right  ventricular size is normal. There is severely elevated pulmonary artery  systolic pressure. The estimated right ventricular systolic pressure is  73.7 mmHg.   5. Left atrial size was moderately dilated.   6. The mitral valve is abnormal. Supect there is a torn chordae with at  least moderate-to-severe mitral regurgitation. By color flow doppler, the  degree of mitral regurgitation appears more moderate-to-severe, however,  there is systolic flow reversal in   the pulmonary vein which is more suggestive of severe mitral  regurgitation. Recommend TEE for further evaluation as above.   7. Tricuspid valve regurgitation is mild to moderate.   8. The aortic valve is tricuspid. There is mild calcification of the  aortic valve. There is mild thickening of the aortic valve. Aortic valve  regurgitation is not visualized. Aortic valve sclerosis/calcification is  present, without any evidence of  aortic stenosis.   9. Aortic dilatation noted. There is borderline dilatation of the  ascending aorta, measuring 38 mm.  10. The inferior vena cava is normal in size with greater than 50%  respiratory variability, suggesting right atrial pressure of 3 mmHg.   Comparison(s): Compared to prior TTE on 12/16/20, the LVEF is now severely  reduced (previously 50-55%) with wall motion abnormalities as described  above. There is now at least moderate-to-severe MR with concern for a torn  chordae. Recommend cath and TEE  for further work-up.   R/L Heart Cath 7/17   1st Diag lesion is 90% stenosed.   Prox RCA to Mid RCA lesion is 100% stenosed.   Origin to Prox Graft lesion is 100% stenosed.   Prox Cx to Mid Cx lesion is 90% stenosed.   2nd Mrg  lesion is 90% stenosed.   Lat 2nd Mrg lesion is 95% stenosed.   Origin to Prox Graft lesion is 100% stenosed.   Severe native coronary artery disease with significant coronary calcification.  The LAD is totally occluded after the first diagonal vessel and the first diagonal vessel has 90% ostial stenosis.   Left circumflex vessel has a 90% focal proximal stenosis and 90% stenosis in the marginal branch with distal 95% branch stenosis.   Right coronary artery is totally occluded proximally.   The LIMA graft supplying the LAD is widely patent.   The vein graft supplying the distal RCA is patent.  There is irregularity of the distal PD/PLA vasculature.   The vein graft which had supplied the diagonal vessel is totally occluded. (Old)   The vein graft had supplied the circumflex vessel is totally occluded. (Old)   Mildly elevated right heart pressures with mild pulmonary venous hypertension and PA pressure at 30 mmHg.   RECOMMENDATION: Patient will be undergoing TEE for further evaluation of mitral valve and valvular apparatus with concern for possible torn chordae on the transthoracic echo.  We will have colleagues review angiographic images with high-grade circumflex stenoses.   Diagnostic Dominance: Right   Patient Profile     81 y.o. male with CAD s/p CABG with PCI in 2007 subsequently, hypertension diabetes chronic diastolic heart failure with newly diagnosed systolic heart failure with a EF less than 20% and moderate to severe mitral regurgitation.  Sees Dr. Acie Fredrickson as outpatient.  Assessment & Plan    Severe mitral regurgitation Acute on chronic systolic heart failure - Echocardiogram from May 2022 showed EF 50% with mild to moderate MR.   --  Repeat echo this admission showed ejection fraction less than 20%, multiple wall motion abnormalities, moderate-severe MR with concern for a torn chordae, severely elevated pulmonary artery pressure - Underwent R/L heart cath on 7/17 that  showed severe native CAD with significant coronary calcification, total occlusions of the SVG-diag, SVG-LCX, patent LIMA and SVG-RCA. No targets for intervention. Mildly elevated right heart pressures with mild pulmonary venous HTN and PA pressure at 30 mmHg.  - Scheduled for TEE today with Dr. Harriet Masson for further evaluation of mitral valve - Patient has been receiving lasix 40 mg IV twice daily.  Output 5.4 L urine yesterday and is currently neg -10 L since admission. Renal function stable.  -- K decreased to 2.9 today-- IV K ordered per primary team  -- Hold lasix while K being supplemented  - Off of metoprolol given significantly reduced EF - BP somewhat soft-- hold entresto for now. Can attempt to resume at a future date  -- Continue jardiance 10 mg daily  -- Suggest starting spiro prior to discharge-- can wait until he is off IV K    CAD status post CABG - With significant drop in EF, underwent cath to redefine anatomy. Findings as above  --Patient without chest pain  -Currently on ASA, Plavix -Continue with Zetia 10.  Did not tolerate statins --Metoprolol held due to significantly reduced EF    Statin intolerance - On Zetia      For questions or updates, please contact [place] Please consult [email or web address] for contact info under        Signed, Margie Billet, PA-C  01/28/2022, 8:35 AM    Personally seen and examined. Agree with above.  Awaiting transesophageal echocardiogram today to evaluate mitral valve regurgitation.  Concern for torn cord.  Right months heart cath showed severe native vessel CAD no intervention performed.  Medical management.  Candee Furbish, MD

## 2022-01-28 NOTE — Anesthesia Procedure Notes (Signed)
Procedure Name: General with mask airway Date/Time: 01/28/2022 1:34 PM  Performed by: Erick Colace, CRNAPre-anesthesia Checklist: Patient identified, Emergency Drugs available, Suction available and Patient being monitored Patient Re-evaluated:Patient Re-evaluated prior to induction Oxygen Delivery Method: Simple face mask Preoxygenation: Pre-oxygenation with 100% oxygen Induction Type: IV induction Airway Equipment and Method: Bite block Dental Injury: Teeth and Oropharynx as per pre-operative assessment

## 2022-01-28 NOTE — Plan of Care (Signed)
  Problem: Education: Goal: Knowledge of disease or condition will improve Outcome: Progressing Goal: Understanding of medication regimen will improve Outcome: Progressing Goal: Individualized Educational Video(s) Outcome: Progressing   Problem: Activity: Goal: Ability to tolerate increased activity will improve Outcome: Progressing   Problem: Cardiac: Goal: Ability to achieve and maintain adequate cardiopulmonary perfusion will improve Outcome: Progressing   Problem: Health Behavior/Discharge Planning: Goal: Ability to safely manage health-related needs after discharge will improve Outcome: Progressing   Problem: Education: Goal: Knowledge of General Education information will improve Description: Including pain rating scale, medication(s)/side effects and non-pharmacologic comfort measures Outcome: Progressing   Problem: Health Behavior/Discharge Planning: Goal: Ability to manage health-related needs will improve Outcome: Progressing   Problem: Clinical Measurements: Goal: Ability to maintain clinical measurements within normal limits will improve Outcome: Progressing Goal: Will remain free from infection Outcome: Progressing Goal: Diagnostic test results will improve Outcome: Progressing Goal: Respiratory complications will improve Outcome: Progressing Goal: Cardiovascular complication will be avoided Outcome: Progressing   Problem: Activity: Goal: Risk for activity intolerance will decrease Outcome: Progressing   Problem: Nutrition: Goal: Adequate nutrition will be maintained Outcome: Progressing   Problem: Coping: Goal: Level of anxiety will decrease Outcome: Progressing   Problem: Elimination: Goal: Will not experience complications related to bowel motility Outcome: Progressing Goal: Will not experience complications related to urinary retention Outcome: Progressing   Problem: Pain Managment: Goal: General experience of comfort will improve Outcome:  Progressing   Problem: Safety: Goal: Ability to remain free from injury will improve Outcome: Progressing   Problem: Skin Integrity: Goal: Risk for impaired skin integrity will decrease Outcome: Progressing   Problem: Education: Goal: Ability to demonstrate management of disease process will improve Outcome: Progressing Goal: Ability to verbalize understanding of medication therapies will improve Outcome: Progressing Goal: Individualized Educational Video(s) Outcome: Progressing   Problem: Activity: Goal: Capacity to carry out activities will improve Outcome: Progressing   Problem: Cardiac: Goal: Ability to achieve and maintain adequate cardiopulmonary perfusion will improve Outcome: Progressing   Problem: Education: Goal: Understanding of CV disease, CV risk reduction, and recovery process will improve Outcome: Progressing Goal: Individualized Educational Video(s) Outcome: Progressing   Problem: Activity: Goal: Ability to return to baseline activity level will improve Outcome: Progressing   Problem: Cardiovascular: Goal: Ability to achieve and maintain adequate cardiovascular perfusion will improve Outcome: Progressing Goal: Vascular access site(s) Level 0-1 will be maintained Outcome: Progressing   Problem: Health Behavior/Discharge Planning: Goal: Ability to safely manage health-related needs after discharge will improve Outcome: Progressing

## 2022-01-28 NOTE — Progress Notes (Signed)
Subjective:   Hospital day 3.  Interval events: No acute events overnight.  Patient continues to feel well today.  No new shortness of breath or chest pain.  Endorses 1 episode of coughing that resolved by itself this morning.  Objective:  Vital signs: Blood pressure 113/70, pulse 80, temperature 97.9 F (36.6 C), temperature source Axillary, resp. rate 15, height '5\' 9"'$  (1.753 m), weight 58.2 kg, SpO2 98 %.  Physical exam: Constitutional: Elderly male lying in bed semireclined.  In no apparent distress. Cardiovascular: Irregular rhythm. Pulmonary: Clear to auscultation bilaterally.  Normal work of breathing noted. Skin: Warm and dry. Extremities: Left radial pulse 2+. Neuro: Alert and oriented. Psych: Pleasant.  Appropriate mood and affect.   Intake/Output Summary (Last 24 hours) at 01/28/2022 0638 Last data filed at 01/28/2022 0430 Gross per 24 hour  Intake 480 ml  Output 5350 ml  Net -4870 ml    Pertinent Labs:    Latest Ref Rng & Units 01/28/2022    3:03 AM 01/27/2022    3:37 AM 01/26/2022    6:10 PM  CBC  WBC 4.0 - 10.5 K/uL 6.1  6.4    Hemoglobin 13.0 - 17.0 g/dL 12.5  11.8  11.6   Hematocrit 39.0 - 52.0 % 36.0  33.8  34.0   Platelets 150 - 400 K/uL 128  92         Latest Ref Rng & Units 01/28/2022    3:03 AM 01/27/2022    3:37 AM 01/26/2022    6:10 PM  CMP  Glucose 70 - 99 mg/dL 155  142    BUN 8 - 23 mg/dL 11  13    Creatinine 0.61 - 1.24 mg/dL 0.72  0.73    Sodium 135 - 145 mmol/L 136  135  136   Potassium 3.5 - 5.1 mmol/L 2.9  3.7  2.9   Chloride 98 - 111 mmol/L 103  99    CO2 22 - 32 mmol/L 26  23    Calcium 8.9 - 10.3 mg/dL 8.4  8.4     Assessment/Plan:   Principal Problem:   Acute exacerbation of CHF (congestive heart failure) (HCC) Active Problems:   Shortness of breath   Multifocal atrial tachycardia (HCC)   Acute respiratory failure with hypoxia (HCC)   Mitral regurgitation   Patient Summary: [name] is a 81  y.o. with a PMH of coronary artery disease status post CABG, who presented with severe shortness of breath and weakness and was admitted for mitral regurgitation acute systolic heart failure.   Severe mitral regurgitation Acute on chronic systolic heart failure Hypoxic respiratory failure, improving TEE today to evaluate the mitral valve.  Weight is down 8 kg.  Discontinuing Lasix as patient is net -10 L on this admission and is beginning to have hypokalemia.  Symptoms have also improved dramatically.  Going to discontinue Imdur in favor of spironolactone or Entresto.  We will initiate GDMT cautiously given borderline blood pressure and severely reduced EF. - Discontinue Lasix  - Discontinue Imdur - Continue Jardiance 10 mg daily. - Continue holding metoprolol and Entresto for soft blood pressure. - Daily BMP. - Follow-up a.m. mag - I/O monitoring. - Daily weights. - Telemetry.   CAD status post CABG LHC showed severe native coronary artery disease.  Total occlusions of SVG diagonal, SVG left circumflex.  Patent LIMA and SVG right coronary artery.  Currently  on DAPT. - Aspirin 81 mg daily - Plavix 75 mg daily - Ezetimibe 10 mg daily  Hypokalemia - 40 mEq IV potassium over 4 hours - 40 mEq p.o. potassium after TEE  Constipation - Continue senna docusate 1 tablet as needed for constipation  Vitamin D deficiency - Continue cholecalciferol 5000 units daily  ITP Chronic and stable - Daily CBC  Transitions of care PT OT following.  Appreciate their input. - Evaluate patient for home O2 prior to discharge.   Diet:  heart healthy IVF: None,None VTE: Enoxaparin Code: Full PT/OT recs: Home Health.     Dispo: Anticipated discharge to Home in 2 days pending evaluation and treatment of heart failure secondary to mitral regurgitation.    Nani Gasser, MD 01/28/2022, 6:38 AM  Pager: [phone] After 5pm on weekdays and 1pm on weekends: On Call pager: [phone]

## 2022-01-28 NOTE — Anesthesia Preprocedure Evaluation (Addendum)
Anesthesia Evaluation  Patient identified by MRN, date of birth, ID band Patient awake    Reviewed: Allergy & Precautions, NPO status , Patient's Chart, lab work & pertinent test results  History of Anesthesia Complications Negative for: history of anesthetic complications  Airway Mallampati: II  TM Distance: >3 FB Neck ROM: Full    Dental no notable dental hx.    Pulmonary COPD,    Pulmonary exam normal        Cardiovascular hypertension, Pt. on medications + CAD, + Peripheral Vascular Disease and +CHF  Normal cardiovascular exam  TTE 01/25/22: LVEF <20%, multiple wall motion  abnormalities and at least moderate-to-severe MR with concern for a torn chordae, highly mobile, echobright density noted in the LV in the subvalvular apparatus most concerning for a torn chordae given degree of  mitral regurgitation that has significantly progressed from prior study, RV systolic function mildly reduced, severely elevated PASP 80.4 mmHg, moderate LAE, borderline dilatation of the ascending aorta measuring 38 mm    Neuro/Psych negative neurological ROS  negative psych ROS   GI/Hepatic Neg liver ROS, hiatal hernia,   Endo/Other  diabetes, Type 2, Oral Hypoglycemic Agents  Renal/GU negative Renal ROS  negative genitourinary   Musculoskeletal negative musculoskeletal ROS (+)   Abdominal   Peds  Hematology  (+) Blood dyscrasia, anemia ,   Anesthesia Other Findings Day of surgery medications reviewed with patient.  Reproductive/Obstetrics negative OB ROS                            Anesthesia Physical Anesthesia Plan  ASA: 4  Anesthesia Plan: MAC   Post-op Pain Management: Minimal or no pain anticipated   Induction:   PONV Risk Score and Plan: Treatment may vary due to age or medical condition and Propofol infusion  Airway Management Planned: Natural Airway and Nasal Cannula  Additional Equipment:  None  Intra-op Plan:   Post-operative Plan:   Informed Consent: I have reviewed the patients History and Physical, chart, labs and discussed the procedure including the risks, benefits and alternatives for the proposed anesthesia with the patient or authorized representative who has indicated his/her understanding and acceptance.       Plan Discussed with: CRNA  Anesthesia Plan Comments:        Anesthesia Quick Evaluation

## 2022-01-28 NOTE — Transfer of Care (Signed)
Immediate Anesthesia Transfer of Care Note  Patient: [name]  Procedure(s) Performed: TRANSESOPHAGEAL ECHOCARDIOGRAM (TEE)  Patient Location: PACU  Anesthesia Type:MAC  Level of Consciousness: drowsy  Airway & Oxygen Therapy: Patient Spontanous Breathing and Patient connected to face mask oxygen  Post-op Assessment: Report given to RN and Post -op Vital signs reviewed and stable  Post vital signs: Reviewed and stable  Last Vitals:  Vitals Value Taken Time  BP 111/67 01/28/22 1420  Temp    Pulse 101 01/28/22 1422  Resp 27 01/28/22 1422  SpO2 100 % 01/28/22 1422  Vitals shown include unvalidated device data.  Last Pain:  Vitals:   01/28/22 1250  TempSrc: Temporal  PainSc: 0-No pain         Complications: No notable events documented.

## 2022-01-28 NOTE — Progress Notes (Signed)
  Echocardiogram Echocardiogram Transesophageal has been performed.  Johny Chess 01/28/2022, 2:23 PM

## 2022-01-28 NOTE — Progress Notes (Signed)
Physical Therapy Treatment Patient Details Name: [name] MRN: [id number] DOB: [birth year] Today's Date: 01/28/2022   History of Present Illness The pt is an 81 yo male presenting 7/15 with SOB and possible new onset afib. Upon work up, suspect multifocal atrial tachycardia complicated by persistent SOB with hypoxia. PMH includes: CAD s/p CABG, COPD, HLD, and DM II. CT cervical spine 2018: Severe degenerative disc disease at C6-7    PT Comments    Tried straight cane with pt and he needed a little more assist than he did with the walker. Will continue to progress mobility and work on improving balance. Pt has both a cane and a rolling walker at home.  Pt did c/o nausea ~5 minutes after finishing amb and c/o IV site pain with potassium running. Nursing aware.  Recommendations for follow up therapy are one component of a multi-disciplinary discharge planning process, led by the attending physician.  Recommendations may be updated based on patient status, additional functional criteria and insurance authorization.  Follow Up Recommendations  Home health PT     Assistance Recommended at Discharge Intermittent Supervision/Assistance  Patient can return home with the following A little help with walking and/or transfers;A little help with bathing/dressing/bathroom;Assistance with cooking/housework;Assistance with feeding;Direct supervision/assist for medications management;Direct supervision/assist for financial management;Assist for transportation;Help with stairs or ramp for entrance   Equipment Recommendations  None recommended by PT    Recommendations for Other Services       Precautions / Restrictions Precautions Precautions: Fall Restrictions Weight Bearing Restrictions: No     Mobility  Bed Mobility Overal bed mobility: Needs Assistance Bed Mobility: Supine to Sit     Supine to sit: Supervision, HOB elevated     General bed mobility comments: incr time and effort     Transfers Overall transfer level: Needs assistance Equipment used: Straight cane Transfers: Sit to/from Stand Sit to Stand: Min assist           General transfer comment: Assist for stability on rising    Ambulation/Gait Ambulation/Gait assistance: Min assist Gait Distance (Feet): 225 Feet Assistive device: Straight cane Gait Pattern/deviations: Step-through pattern, Decreased stride length Gait velocity: decr Gait velocity interpretation: 1.31 - 2.62 ft/sec, indicative of limited community ambulator   General Gait Details: Assist for balance when using cane.   Stairs             Wheelchair Mobility    Modified Rankin (Stroke Patients Only)       Balance Overall balance assessment: Needs assistance Sitting-balance support: Bilateral upper extremity supported, Feet supported Sitting balance-Leahy Scale: Good     Standing balance support: No upper extremity supported, During functional activity Standing balance-Leahy Scale: Fair                              Cognition Arousal/Alertness: Awake/alert Behavior During Therapy: WFL for tasks assessed/performed Overall Cognitive Status: Within Functional Limits for tasks assessed                                          Exercises      General Comments General comments (skin integrity, edema, etc.): VSS on RA. After amb pt sat in chair briefly but became nauseous and light headed. BP 120's/80's. Assisted back to bed and nurse aware.      Pertinent Vitals/Pain Pain Assessment Pain Assessment:  Faces Faces Pain Scale: Hurts little more Pain Location: LUE with IV potassium Pain Descriptors / Indicators: Grimacing Pain Intervention(s): Limited activity within patient's tolerance, Monitored during session    Home Living                          Prior Function            PT Goals (current goals can now be found in the care plan section) Acute Rehab PT  Goals Patient Stated Goal: return home Progress towards PT goals: Progressing toward goals    Frequency    Min 3X/week      PT Plan Current plan remains appropriate    Co-evaluation              AM-PAC PT "6 Clicks" Mobility   Outcome Measure  Help needed turning from your back to your side while in a flat bed without using bedrails?: A Little Help needed moving from lying on your back to sitting on the side of a flat bed without using bedrails?: A Little Help needed moving to and from a bed to a chair (including a wheelchair)?: A Little Help needed standing up from a chair using your arms (e.g., wheelchair or bedside chair)?: A Little Help needed to walk in hospital room?: A Little Help needed climbing 3-5 steps with a railing? : A Little 6 Click Score: 18    End of Session   Activity Tolerance: Patient tolerated treatment well Patient left: with call bell/phone within reach;in bed;with bed alarm set Nurse Communication: Mobility status PT Visit Diagnosis: Other abnormalities of gait and mobility (R26.89);Muscle weakness (generalized) (M62.81)     Time: 5110-2111 PT Time Calculation (min) (ACUTE ONLY): 21 min  Charges:  $Gait Training: 8-22 mins                     [place] [facility] [place] 01/28/2022, 10:01 AM

## 2022-01-28 NOTE — Consult Note (Addendum)
[name]  Inpatient [facility] Consultation:   Patient ID: [name]; [id number]; 27-Jan-1941   Admit date: 01/24/2022 Date of Consult: 01/28/2022  Primary Care Provider: Lillard Anes, MD Primary Cardiologist: Dr. Acie Fredrickson, MD   Patient Profile:   [name] is a 81 y.o. male with a hx of CAD s/p CABG with LIMA to LAD, SVG to LCx, SVG to diagonal and SVG to distal RCA in 2003, s/p PCI to LM in 2007, HTN, DM2, stable idiopathic thrombocytopenic purpura followed by Hem/Onc and chronic diastolic CHF with newly diagnosed systolic heart failure who presented to [facility] with significant weakness and SOB found to have an LVEF at <20% and severe MR with concern for chord rupture.    History of Present Illness:   [name] lives in [place] with his wife of many years. He has three children, two daughters and one son who are all very involved with his care. He is very functional at baseline and continues to be very sharp with his cognition. He is able to perform ADL/IADLs, house chores including vacuuming, laundry, bathroom cleaning without issues. He continues to drive his car and does not require a device for ambulation. He is edentulous and does not follow with a dentist.    Mr. Gorrell is followed by Dr. Acie Fredrickson for is cardiology care. His cardiac problems began in 2003 after presenting with an MI and ultimately underwent CABG with LIMA to LAD, SVG to LCx, SVG to diagonal and SVG to distal RCA. In 2007  he had a repeat cardiac catheterization which showed LM stenosis and is now s/p PCI to the LM. Repeat LHC in 2009 showed patent LM stent however occluded grafts to LCX and diagonal. He reports that he has been very stable since this time other than a hospitalization for cellulitis some years ago which he states was more bothersome than his cardiac hospitalizations.   Approximately 1 week ago he notice more fatigue than usual  however was not necessarily concerned. On Saturday, he woke up and was on the way to the kitchen to make coffee. They live in a one level ranch style house. He was about half way to the kitchen and he became acutely SOB to the point that he felt "as if he would not make it". He called out for his wife to call EMS and he was brought to the ED for further evaluation. On arrival, HsT found to be 57>59>34. BNP elevated at 1092. CXR with pleural effusions and pulmonary edema. Echocardiogram obtained which showed severely reduced LVEF<20% with global hypokinesis that appeared worse in the anterior, septal and apical segments. There was also at least moderate-to-severe MR with concern for a torn chordae with severe pulmonary HTN. He has been treated with IV Lasix with plan to undergo R/LHC. This was performed 7/17 which showed severe native CAD with significant coronary calcification, total occlusions of the SVG-diag, SVG-LCX, patent LIMA and SVG-RCA. No targets for intervention. Mildly elevated right heart pressures with mild pulmonary venous HTN and PA pressure at 30 mmHg. Recommendations were to proceed with TEE to further investigate mitral valve and valvular apparatus with concern for possible torn chordae on the transthoracic echocardiogram. TEE 01/28/22 shows an EF at <20% with global hypokinesis. The mitral valve and the subvalvular apparatus appears in to be  intact however with a highly mobile echogenic mass felt to be  likely at the level of the papillary muscle. The mitral valve  does not coapt well which is consisitent with mitral annulus dilatation. Secondary/atrial functional moderate to severe mitral regurgitation.   On my exam, he is sitting in the bed after TEE. He is very pleasant and his daughters are at bedside. He states that he has walked several times since admission and feels stable but not at baseline. He appears to be euvolemic on exam today and has been diuresing with Lasix. He has no anginal  symptoms, SOB, dyspnea with talking, LE edema, palpitations, or episodes of dizziness.   Past Medical History:  Diagnosis Date   Atherosclerotic heart disease of native coronary artery without angina pectoris 02/12/2010   Qualifier: Diagnosis of  By: Marland Mcalpine     Benign prostatic hyperplasia with lower urinary tract symptoms 12/04/2019   CAD (coronary artery disease)    Hx of    Cellulitis 03/19/2012   Chronic obstructive pulmonary disease ([place]) 12/04/2019   Diverticulosis of colon (without mention of hemorrhage) 2011   Colonoscopy    Hiatal hernia 2011   EGD   Hypertension    Hypertensive heart disease without heart failure 12/04/2019   ITP (idiopathic thrombocytopenic purpura)    He has been diagnosed with   Pancytopenia    Thrombocytopenia, unspecified ([place]) 01/24/2013   TRANSAMINASES, SERUM, ELEVATED 02/12/2010   Qualifier: Diagnosis of  By: Marland Mcalpine     Type 2 diabetes mellitus with diabetic peripheral angiopathy without gangrene ([place]) 12/04/2019    Past Surgical History:  Procedure Laterality Date   CARDIAC CATHETERIZATION     His last heart catheterization in May of 2009 reveals a patent LIMA   CORONARY ANGIOPLASTY     Successful percutaneous transluminal coronary angioplasty of the left circumflex obuse marginal vessel   CORONARY ANGIOPLASTY WITH STENT PLACEMENT     CORONARY ARTERY BYPASS GRAFT     x4 with a left internal mammary artery anastomosis to the left anterior descending coronary artery    CORONARY STENT PLACEMENT     successful percutaneous transluminal coronary angioplasty and stenting of the left main coronary artery   HERNIA REPAIR     RIGHT/LEFT HEART CATH AND CORONARY/GRAFT ANGIOGRAPHY N/A 01/26/2022   Procedure: RIGHT/LEFT HEART CATH AND CORONARY/GRAFT ANGIOGRAPHY;  Surgeon: Troy Sine, MD;  Location: [place] [facility];  Service: Cardiovascular;  Laterality: N/A;   SAPHENOUS VEIN GRAFT RESECTION     graft to the first obtuse  marginal, a saphenous vein graft to the first diagonal coronary artery  and a saphenous vein graft to the distal right coronary artery   US ECHOCARDIOGRAPHY  02-08-2007   Est. EF 40-45%     Inpatient Medications: Scheduled Meds:  [MAR Hold] aspirin  81 mg Oral Daily   [MAR Hold] cholecalciferol  5,000 Units Oral Daily   [MAR Hold] clopidogrel  75 mg Oral Daily   [MAR Hold] empagliflozin  10 mg Oral Daily   [MAR Hold] enoxaparin (LOVENOX) injection  40 mg Subcutaneous Q24H   [MAR Hold] ezetimibe  10 mg Oral Daily   [MAR Hold] multivitamin with minerals  1 tablet Oral Daily   potassium chloride  40 mEq Oral Once   [MAR Hold] sodium chloride flush  3 mL Intravenous Q12H   Continuous Infusions:  [MAR Hold] sodium chloride     sodium chloride 20 mL/hr at 01/28/22 0841   PRN Meds: [MAR Hold] sodium chloride, [MAR Hold] acetaminophen **OR** [MAR Hold] acetaminophen, [MAR Hold] acetaminophen, [MAR Hold] albuterol, [MAR Hold] diazepam, [MAR Hold] ondansetron (ZOFRAN) IV, [MAR  Hold] ondansetron **OR** [DISCONTINUED] ondansetron (ZOFRAN) IV, [MAR Hold] senna-docusate, [MAR Hold] sodium chloride flush  Allergies:    Allergies  Allergen Reactions   Lipitor [Atorvastatin Calcium] Other (See Comments)    Causes memory loss   Crestor [Rosuvastatin Calcium] Other (See Comments)    Causes Muscle Pain   Zocor [Simvastatin] Other (See Comments)    Causes muscle pain    Social History:   Social History   Socioeconomic History   Marital status: Married    Spouse name: Doris   Number of children: 3   Years of education: Not on file   Highest education level: High school graduate  Occupational History   Occupation: Retired   Tobacco Use   Smoking status: Never   Smokeless tobacco: Never  Vaping Use   Vaping Use: Never used  Substance and Sexual Activity   Alcohol use: No   Drug use: No   Sexual activity: Not Currently  Other Topics Concern   Not on file  Social History Narrative    Daily caffeine use    Social Determinants of Health   Financial Resource Strain: Low Risk  (01/26/2022)   Overall Financial Resource Strain (CARDIA)    Difficulty of Paying Living Expenses: Not hard at all  Food Insecurity: No Food Insecurity (01/26/2022)   Hunger Vital Sign    Worried About Running Out of Food in the Last Year: Never true    [place] in the Last Year: Never true  Transportation Needs: No Transportation Needs (01/26/2022)   PRAPARE - Hydrologist (Medical): No    Lack of Transportation (Non-Medical): No  Physical Activity: Not on file  Stress: Not on file  Social Connections: Not on file  Intimate Partner Violence: Not on file    Family History:   The patient's family history includes Cerebrovascular Accident in his mother. There is no history of Colon cancer.  ROS:  Please see the history of present illness.  ROS  All other ROS reviewed and negative.     Physical Exam/Data:   Vitals:   01/28/22 1250 01/28/22 1420 01/28/22 1430 01/28/22 1445  BP:  111/67 101/69 93/62  Pulse: (!) 106 (!) 108 97 78  Resp: (!) 24 18 (!) 23 19  Temp: 98 F (36.7 C) 98.1 F (36.7 C)    TempSrc: Temporal     SpO2: 94% 97% 99% 95%  Weight:      Height:        Intake/Output Summary (Last 24 hours) at 01/28/2022 1454 Last data filed at 01/28/2022 1416 Gross per 24 hour  Intake 300 ml  Output 3875 ml  Net -3575 ml   Filed Weights   01/26/22 0434 01/27/22 0444 01/28/22 0427  Weight: 62.8 kg 62.8 kg 58.2 kg   Body mass index is 18.96 kg/m.   General: Elderly, well developed, NAD Neck: Negative for carotid bruits. No JVD Lungs:Clear to ausculation bilaterally. Breathing is unlabored. Cardiovascular: Irregular with S1 S2. + systolic murmur. Abdomen: Soft, non-tender, non-distended. No obvious abdominal masses. Extremities: No edema.  Neuro: Alert and oriented. No focal deficits. No facial asymmetry. MAE spontaneously. Psych: Responds to  questions appropriately with normal affect.    EKG:  The EKG was personally reviewed and demonstrates:  NSR with PACs/PVC, HR 101 with diffuse T wave abnormalities Telemetry:  Telemetry was personally reviewed and demonstrates:  NSR with PACs, HR 90-100's   Relevant CV Studies:  TEE 01/28/22:   1. Left  ventricular ejection fraction, by estimation, is <20%. The left  ventricle has severely decreased function. The left ventricle demonstrates  global hypokinesis.   2. The mitral valve and the subvalvular apparatus appears in to be  intact. There is a noted highly mobile echogenic mass which appears to be  likely at the level of the papillary muscle. The mitral valve does not  coapt well which is consisitent with  mitral annulus dilatation. Secondary/atrial functional moderate to severe  mitral regurgitation. No evidence of mitral stenosis.   3. Right ventricular systolic function is moderately reduced. The right  ventricular size is normal.   4. No left atrial/left atrial appendage thrombus was detected.   5. Tricuspid valve regurgitation is mild to moderate.   6. The aortic valve is tricuspid. Aortic valve regurgitation is mild.  Aortic valve sclerosis is present, with no evidence of aortic valve  stenosis.   R/LHC 01/26/22:    1st Diag lesion is 90% stenosed.   Prox RCA to Mid RCA lesion is 100% stenosed.   Origin to Prox Graft lesion is 100% stenosed.   Prox Cx to Mid Cx lesion is 90% stenosed.   2nd Mrg lesion is 90% stenosed.   Lat 2nd Mrg lesion is 95% stenosed.   Origin to Prox Graft lesion is 100% stenosed.   Severe native coronary artery disease with significant coronary calcification.  The LAD is totally occluded after the first diagonal vessel and the first diagonal vessel has 90% ostial stenosis.   Left circumflex vessel has a 90% focal proximal stenosis and 90% stenosis in the marginal branch with distal 95% branch stenosis.   Right coronary artery is totally occluded  proximally.   The LIMA graft supplying the LAD is widely patent.   The vein graft supplying the distal RCA is patent.  There is irregularity of the distal PD/PLA vasculature.   The vein graft which had supplied the diagonal vessel is totally occluded. (Old)   The vein graft had supplied the circumflex vessel is totally occluded. (Old)   Mildly elevated right heart pressures with mild pulmonary venous hypertension and PA pressure at 30 mmHg.   RECOMMENDATION: Patient will be undergoing TEE for further evaluation of mitral valve and valvular apparatus with concern for possible torn chordae on the transthoracic echo.  We will have colleagues review angiographic images with high-grade circumflex stenoses.    Echocardiogram 01/25/22:   1. TTE with newly reduced LVEF of <20%, multiple wall motion  abnormalities and at least moderate-to-severe MR with concern for a torn  chordae. Recommend cath and TEE for further evaluation.   2. Left ventricular ejection fraction, by estimation, is <20%. The left  ventricle has severely decreased function. The left ventricle demonstrates  regional wall motion abnormalities (see scoring diagram/findings for  description). There is severe global  hypokinesis which appears worse in the anterior, septal and apical  segments. Left ventricular diastolic parameters are consistent with Grade  II diastolic dysfunction (pseudonormalization). Recommend cath for further  work-up.   3. There is a highly mobile, echobright density noted in the LV in the  subvalvular apparatus most concerning for a torn chordae given degree of  mitral regurgitation that has significantly progressed from prior study.  Recommend TEE for further  evaluation.   4. Right ventricular systolic function is mildly reduced. The right  ventricular size is normal. There is severely elevated pulmonary artery  systolic pressure. The estimated right ventricular systolic pressure is  40.9 mmHg.    5.  Left atrial size was moderately dilated.   6. The mitral valve is abnormal. Supect there is a torn chordae with at  least moderate-to-severe mitral regurgitation. By color flow doppler, the  degree of mitral regurgitation appears more moderate-to-severe, however,  there is systolic flow reversal in   the pulmonary vein which is more suggestive of severe mitral  regurgitation. Recommend TEE for further evaluation as above.   7. Tricuspid valve regurgitation is mild to moderate.   8. The aortic valve is tricuspid. There is mild calcification of the  aortic valve. There is mild thickening of the aortic valve. Aortic valve  regurgitation is not visualized. Aortic valve sclerosis/calcification is  present, without any evidence of  aortic stenosis.   9. Aortic dilatation noted. There is borderline dilatation of the  ascending aorta, measuring 38 mm.  10. The inferior vena cava is normal in size with greater than 50%  respiratory variability, suggesting right atrial pressure of 3 mmHg.   Comparison(s): Compared to prior TTE on 12/16/20, the LVEF is now severely  reduced (previously 50-55%) with wall motion abnormalities as described  above. There is now at least moderate-to-severe MR with concern for a torn  chordae. Recommend cath and TEE  for further work-up.   Echocardiogram 07/18/13: EF 50-55, no RWMA, Gr 2 DD, mild AI, mild MR, mod LAE, trivial eff   Cardiac catheterization 11/30/2007: LM stent patent LAD prox 90, then 100; D1 and D2 90 LCx 60-80 RCA 100 S-RCA/PDA - RCA segment patent, PDA and PL segment severe dz (75-90) L-LAD patent S-D1 100 S-LCx 100 EF 40-45   Laboratory Data:  Chemistry Recent Labs  Lab 01/26/22 0348 01/26/22 1759 01/26/22 1810 01/27/22 0337 01/28/22 0303  NA 135   < > 136 135 136  K 3.7   < > 2.9* 3.7 2.9*  CL 99  --   --  99 103  CO2 29  --   --  23 26  GLUCOSE 147*  --   --  142* 155*  BUN 13  --   --  13 11  CREATININE 1.00  --   --  0.73  0.72  CALCIUM 8.8*  --   --  8.4* 8.4*  GFRNONAA >60  --   --  >60 >60  ANIONGAP 7  --   --  13 7   < > = values in this interval not displayed.    No results for input(s): "PROT", "ALBUMIN", "AST", "ALT", "ALKPHOS", "BILITOT" in the last 168 hours. Hematology Recent Labs  Lab 01/26/22 0348 01/26/22 1759 01/26/22 1810 01/27/22 0337 01/28/22 0303  WBC 7.0  --   --  6.4 6.1  RBC 3.80*  --   --  3.68* 3.94*  HGB 12.1*   < > 11.6* 11.8* 12.5*  HCT 35.7*   < > 34.0* 33.8* 36.0*  MCV 93.9  --   --  91.8 91.4  MCH 31.8  --   --  32.1 31.7  MCHC 33.9  --   --  34.9 34.7  RDW 13.5  --   --  13.4 13.3  PLT 139*  --   --  92* 128*   < > = values in this interval not displayed.   Cardiac EnzymesNo results for input(s): "TROPONINI" in the last 168 hours. No results for input(s): "TROPIPOC" in the last 168 hours.  BNP Recent Labs  Lab 01/24/22 1229  BNP 1,092.3*    DDimer No results for input(s): "DDIMER" in the  last 168 hours.  Radiology/Studies:  CARDIAC CATHETERIZATION  Result Date: 01/26/2022   1st Diag lesion is 90% stenosed.   Prox RCA to Mid RCA lesion is 100% stenosed.   Origin to Prox Graft lesion is 100% stenosed.   Prox Cx to Mid Cx lesion is 90% stenosed.   2nd Mrg lesion is 90% stenosed.   Lat 2nd Mrg lesion is 95% stenosed.   Origin to Prox Graft lesion is 100% stenosed. Severe native coronary artery disease with significant coronary calcification.  The LAD is totally occluded after the first diagonal vessel and the first diagonal vessel has 90% ostial stenosis. Left circumflex vessel has a 90% focal proximal stenosis and 90% stenosis in the marginal branch with distal 95% branch stenosis. Right coronary artery is totally occluded proximally. The LIMA graft supplying the LAD is widely patent. The vein graft supplying the distal RCA is patent.  There is irregularity of the distal PD/PLA vasculature. The vein graft which had supplied the diagonal vessel is totally occluded. (Old)  The vein graft had supplied the circumflex vessel is totally occluded. (Old) Mildly elevated right heart pressures with mild pulmonary venous hypertension and PA pressure at 30 mmHg. RECOMMENDATION: Patient will be undergoing TEE for further evaluation of mitral valve and valvular apparatus with concern for possible torn chordae on the transthoracic echo.  We will have colleagues review angiographic images with high-grade circumflex stenoses.   ECHOCARDIOGRAM COMPLETE  Result Date: 01/25/2022    ECHOCARDIOGRAM REPORT   Patient Name:   BADEN BETSCH [facility] Date of Exam: 01/25/2022 Medical Rec #:  [id number]        Height:       69.0 in Accession #:    [id number]       Weight:       141.3 lb Date of Birth:  1941/05/22        BSA:          1.782 m Patient Age:    68 years         BP:           143/95 mmHg Patient Gender: M                HR:           87 bpm. Exam Location:  Inpatient Procedure: 2D Echo Indications:    dyspnea  History:        Patient has prior history of Echocardiogram examinations, most                 recent 12/16/2020. CHF, CAD, COPD, Arrythmias:PAC, PVC and Atrial                 Fibrillation; Risk Factors:Dyslipidemia, Hypertension and                 Diabetes.  Sonographer:    Johny Chess RDCS Referring Phys: [id number] NISCHAL NARENDRA IMPRESSIONS  1. TTE with newly reduced LVEF of <20%, multiple wall motion abnormalities and at least moderate-to-severe MR with concern for a torn chordae. Recommend cath and TEE for further evaluation.  2. Left ventricular ejection fraction, by estimation, is <20%. The left ventricle has severely decreased function. The left ventricle demonstrates regional wall motion abnormalities (see scoring diagram/findings for description). There is severe global hypokinesis which appears worse in the anterior, septal and apical segments. Left ventricular diastolic parameters are consistent with Grade II diastolic dysfunction (pseudonormalization). Recommend cath for further  work-up.  3. There is a highly mobile,  echobright density noted in the LV in the subvalvular apparatus most concerning for a torn chordae given degree of mitral regurgitation that has significantly progressed from prior study. Recommend TEE for further evaluation.  4. Right ventricular systolic function is mildly reduced. The right ventricular size is normal. There is severely elevated pulmonary artery systolic pressure. The estimated right ventricular systolic pressure is 27.7 mmHg.  5. Left atrial size was moderately dilated.  6. The mitral valve is abnormal. Supect there is a torn chordae with at least moderate-to-severe mitral regurgitation. By color flow doppler, the degree of mitral regurgitation appears more moderate-to-severe, however, there is systolic flow reversal in  the pulmonary vein which is more suggestive of severe mitral regurgitation. Recommend TEE for further evaluation as above.  7. Tricuspid valve regurgitation is mild to moderate.  8. The aortic valve is tricuspid. There is mild calcification of the aortic valve. There is mild thickening of the aortic valve. Aortic valve regurgitation is not visualized. Aortic valve sclerosis/calcification is present, without any evidence of aortic stenosis.  9. Aortic dilatation noted. There is borderline dilatation of the ascending aorta, measuring 38 mm. 10. The inferior vena cava is normal in size with greater than 50% respiratory variability, suggesting right atrial pressure of 3 mmHg. Comparison(s): Compared to prior TTE on 12/16/20, the LVEF is now severely reduced (previously 50-55%) with wall motion abnormalities as described above. There is now at least moderate-to-severe MR with concern for a torn chordae. Recommend cath and TEE for further work-up. FINDINGS  Left Ventricle: There is a highly mobile, echobright density noted in the LV in the subvalvular apparatus that is most concerning for a torn chordae given degree of mitral valve regurgitation.  Recommend TEE for further evaluation. Left ventricular ejection fraction, by estimation, is <20%. The left ventricle has severely decreased function. The left ventricle demonstrates regional wall motion abnormalities. The left ventricular internal cavity size was normal in size. There is no left ventricular hypertrophy. Left ventricular diastolic parameters are consistent with Grade II diastolic dysfunction (pseudonormalization). Right Ventricle: The right ventricular size is normal. No increase in right ventricular wall thickness. Right ventricular systolic function is mildly reduced. There is severely elevated pulmonary artery systolic pressure. The tricuspid regurgitant velocity is 4.40 m/s, and with an assumed right atrial pressure of 3 mmHg, the estimated right ventricular systolic pressure is 41.2 mmHg. Left Atrium: Left atrial size was moderately dilated. Right Atrium: Right atrial size was normal in size. Pericardium: There is no evidence of pericardial effusion. Mitral Valve: The mitral valve is abnormal. There is mild thickening of the mitral valve leaflet(s). There is mild calcification of the mitral valve leaflet(s). Mild mitral annular calcification. Moderate to severe mitral valve regurgitation. Tricuspid Valve: The tricuspid valve is normal in structure. Tricuspid valve regurgitation is mild to moderate. Aortic Valve: The aortic valve is tricuspid. There is mild calcification of the aortic valve. There is mild thickening of the aortic valve. Aortic valve regurgitation is not visualized. Aortic valve sclerosis/calcification is present, without any evidence of aortic stenosis. Pulmonic Valve: The pulmonic valve was normal in structure. Pulmonic valve regurgitation is trivial. Aorta: Aortic dilatation noted. There is borderline dilatation of the ascending aorta, measuring 38 mm. Venous: The inferior vena cava is normal in size with greater than 50% respiratory variability, suggesting right atrial  pressure of 3 mmHg. IAS/Shunts: The atrial septum is grossly normal.  LEFT VENTRICLE PLAX 2D LVIDd:         5.60 cm LVIDs:  5.10 cm LV PW:         1.00 cm LV IVS:        1.00 cm LVOT diam:     2.50 cm LV SV:         68 LV SV Index:   38 LVOT Area:     4.91 cm  RIGHT VENTRICLE         IVC TAPSE (M-mode): 1.6 cm  IVC diam: 1.80 cm LEFT ATRIUM             Index        RIGHT ATRIUM           Index LA diam:        5.00 cm 2.81 cm/m   RA Area:     15.80 cm LA Vol (A2C):   80.2 ml 44.99 ml/m  RA Volume:   42.20 ml  23.67 ml/m LA Vol (A4C):   76.1 ml 42.69 ml/m LA Biplane Vol: 80.6 ml 45.22 ml/m  AORTIC VALVE LVOT Vmax:   74.50 cm/s LVOT Vmean:  48.000 cm/s LVOT VTI:    0.138 m  AORTA Ao Root diam: 3.40 cm Ao Asc diam:  3.80 cm TRICUSPID VALVE TR Peak grad:   77.4 mmHg TR Vmax:        440.00 cm/s  SHUNTS Systemic VTI:  0.14 m Systemic Diam: 2.50 cm Gwyndolyn Kaufman MD Electronically signed by Gwyndolyn Kaufman MD Signature Date/Time: 01/25/2022/11:52:10 AM    Final    DG CHEST PORT 1 VIEW  Result Date: 01/25/2022 CLINICAL DATA:  Dyspnea on exertion.  Coronary artery disease. EXAM: PORTABLE CHEST 1 VIEW COMPARISON:  Two-view chest x-ray 01/24/2022 FINDINGS: The heart is enlarged. Atherosclerotic changes are present at the aortic arch. Marked increase in diffuse interstitial edema is present. Bilateral pleural effusions and basilar airspace disease have increased as well. IMPRESSION: 1. Cardiomegaly with increasing interstitial edema and bilateral pleural effusions compatible with congestive heart failure. 2. Bibasilar airspace disease likely reflects atelectasis. Electronically Signed   By: San Morelle M.D.   On: 01/25/2022 10:29     STS Risk Calculator:  Isolated MVR:  Risk of Mortality: 6.733% Renal Failure: 3.430% Permanent Stroke: 4.444% Prolonged Ventilation: 19.295% DSW Infection: 0.073% Reoperation: 6.498% Morbidity or Mortality: 36.144% Short Length of  Stay: 15.011% Long Length of Stay: 23.384%  MV Repair:  Risk of Mortality: 8.331% Renal Failure: 1.735% Permanent Stroke: 6.408% Prolonged Ventilation: 17.328% DSW Infection: 0.038% Reoperation: 8.962% Morbidity or Mortality: 32.563% Short Length of Stay: 18.759% Long Length of Stay: 16.062%  [facility] Cardiomyopathy Questionnaire     01/28/2022    6:12 PM  KCCQ-12  1 a. Ability to shower/bathe Slightly limited  1 b. Ability to walk 1 block Slightly limited  1 c. Ability to hurry/jog Other, Did not do  2. Edema feet/ankles/legs Less than once a week  3. Limited by fatigue All of the time  4. Limited by dyspnea 3+ times a week, not every day  5. Sitting up / on 3+ pillows Never over the past 2 weeks  6. Limited enjoyment of life Moderately limited  7. Rest of life w/ symptoms Mostly dissatisfied  8 a. Participation in hobbies Moderately limited  8 b. Participation in chores Moderately limited  8 c. Visiting family/friends Moderately limited   Assessment and Plan:   DAVARIS YOUTSEY is a 81 y.o. male with symptoms of severe, stage D mitral regurgitation with NYHA Class III symptoms. I have reviewed the patient's recent echocardiogram  which is notable for severely reduced LV systolic function at <06% with at least moderate-to-severe MR with concern for a torn chordae and severe pulmonary HTN. Subsequent TEE confirmed  EF at <20% with global hypokinesis, with intact mitral valve and the subvalvular apparatus however with a highly mobile echogenic mass felt to be at the level of the papillary muscle. The mitral valve does not coapt well which is consisitent with mitral annulus dilatation consistent with secondary, severe mitral regurgitation.   I have reviewed the natural history of mitral regurgitation with the patient. We have discussed the limitations of medical therapy and the poor prognosis associated with symptomatic mitral regurgitation. We have also reviewed  potential treatment options, including palliative medical therapy, conventional surgical mitral valve repair or replacement, and percutaneous mitral valve repair with MitraClip. We discussed treatment options in the context of this patient's specific comorbid medical conditions.    The patient's predicted risk of mortality with conventional mitral valve replacement/repair is 6.7 % / 8.3 % respectively,  primarily based on advanced age, significant coronary disease with new systolic dysfunction. Other significant comorbid conditions HTN and DM2 on oral therapy.   I have discussed the potential plans with the patient, family, and Dr. Burt Knack. Likely will allow him to stabilize a bit on GDMT for his severe LV dysfunction and follow his symptoms in the OP setting for potential MitraClip treatment. Unfortunately, aggressive treatment may be somewhat limited by soft BPs. Would consider restarting home metoprolol given tachycardia with PACs and no further acute HF symptoms. If he tolerates this, would additionally add ARB (on ACE at home). Seems he was trialed on Entresto however this was held due to BP.  Will let primary cardiology Klein make adjustments if in agreement.     Signed, Kathyrn Drown, NP  01/28/2022 2:54 PM   Patient seen, examined. Available data reviewed. Agree with findings, assessment, and plan as outlined by Kathyrn Drown, NP.  The patient is independently interviewed and examined.  On exam, he is alert, oriented, elderly gentleman in no distress.  HEENT is normal, JVP is normal, lungs are clear bilaterally, heart is regular rate and rhythm with a 3/6 holosystolic murmur best heard at the apex but audible throughout, abdomen tender to extremities have no edema, skin is warm and dry with no rash.  The patient is hospitalized with acute on chronic systolic heart failure with underlying ischemic heart disease and severe mitral regurgitation.  He reports about 2 to 3 weeks of progressive weakness  and exercise intolerance with abrupt worsening of his shortness of breath over just a few days prior to hospitalization.  He has clinically improved, but is having difficulty heart failure medications because of low blood pressure.  Structural heart consultation is requested to evaluate his candidacy for transcatheter edge-to-edge repair of the mitral valve with MitraClip.  The patient's echo and TEE images are personally reviewed.  His echocardiogram from a year ago reports an LVEF of 50 to 55%.  However, my review of the images I think his LVEF was severely depressed even on the 2022 echo study.  LV function is now worse and mitral regurgitation has worsened.  He appears to have moderately severe mitral regurgitation with Carpentier type I dysfunction related to his severe LV dysfunction.  I do not appreciate any primary valve abnormalities such as a flail or prolapsed segment.  Cardiac catheterization shows severe native multivessel coronary artery disease with continued patency of the LIMA to LAD graft and vein graft to distal  RCA.  Hemodynamics demonstrated mildly elevated right heart pressures and mild pulmonary venous hypertension with a mean PA pressure 30 mmHg.  The patient's V wave at right heart catheterization is 28 mmHg with mean wedge pressure 21 mmHg by Fick of 2.6 m.  I think the patient will likely benefit from slow outpatient titration of his heart failure regimen as tolerated.  It would be reasonable to reassess LV function with mitral regurgitation with a repeat echocardiogram in about 2-3 months once he is optimized on his medical program.  The patient has close outpatient follow-up arranged with Dr. Acie Fredrickson.  His current treatment regimen includes Jardiance, furosemide, and low-dose metoprolol succinate.  MitraClip would be a reasonable treatment option if his mitral regurgitation does not improve with medical therapy of heart failure.  We discussed the natural history of mitral regurgitation  in the context of chronic heart failure.  We reviewed potential treatment options, which in this patient's case will include TEER with MitraClip or ongoing medical therapy.  I will plan on seeing him back for follow-up after his echocardiogram is completed and he will continue to follow closely with Dr. Acie Fredrickson.  Sherren Mocha, M.D. 01/29/2022 12:42 PM

## 2022-01-29 ENCOUNTER — Other Ambulatory Visit: Payer: Self-pay

## 2022-01-29 DIAGNOSIS — J9601 Acute respiratory failure with hypoxia: Secondary | ICD-10-CM | POA: Diagnosis not present

## 2022-01-29 DIAGNOSIS — I340 Nonrheumatic mitral (valve) insufficiency: Secondary | ICD-10-CM

## 2022-01-29 DIAGNOSIS — I5043 Acute on chronic combined systolic (congestive) and diastolic (congestive) heart failure: Secondary | ICD-10-CM | POA: Diagnosis not present

## 2022-01-29 DIAGNOSIS — E119 Type 2 diabetes mellitus without complications: Secondary | ICD-10-CM | POA: Diagnosis not present

## 2022-01-29 LAB — BASIC METABOLIC PANEL
Anion gap: 7 (ref 5–15)
BUN: 14 mg/dL (ref 8–23)
CO2: 27 mmol/L (ref 22–32)
Calcium: 8.5 mg/dL — ABNORMAL LOW (ref 8.9–10.3)
Chloride: 102 mmol/L (ref 98–111)
Creatinine, Ser: 0.86 mg/dL (ref 0.61–1.24)
GFR, Estimated: >60 mL/min (ref >60)
Glucose, Bld: 157 mg/dL — ABNORMAL HIGH (ref 70–99)
Potassium: 3.8 mmol/L (ref 3.5–5.1)
Sodium: 136 mmol/L (ref 135–145)

## 2022-01-29 LAB — MAGNESIUM: Magnesium: 2 mg/dL (ref 1.7–2.4)

## 2022-01-29 MED ORDER — POLYETHYLENE GLYCOL 3350 17 G PO PACK: 17.0000 g | PACK | Freq: Every day | ORAL | Status: DC | PRN

## 2022-01-29 MED ORDER — FUROSEMIDE 20 MG PO TABS: 20.0000 mg | ORAL_TABLET | Freq: Every day | ORAL | 2 refills | Status: DC

## 2022-01-29 MED ORDER — SENNOSIDES-DOCUSATE SODIUM 8.6-50 MG PO TABS
1.0000 | ORAL_TABLET | Freq: Once | ORAL | Status: AC
  Administered 2022-01-29: 1 via ORAL
  Filled 2022-01-29: qty 1

## 2022-01-29 MED ORDER — CLOPIDOGREL BISULFATE 75 MG PO TABS: 75.0000 mg | ORAL_TABLET | Freq: Every day | ORAL | 3 refills | Status: DC

## 2022-01-29 MED ORDER — METOPROLOL SUCCINATE ER 25 MG PO TB24
12.5000 mg | ORAL_TABLET | Freq: Every day | ORAL | Status: DC
  Administered 2022-01-29: 12.5 mg via ORAL
  Filled 2022-01-29: qty 1

## 2022-01-29 MED ORDER — FUROSEMIDE 40 MG PO TABS
40.0000 mg | ORAL_TABLET | Freq: Every day | ORAL | Status: DC
Start: 2022-01-29 — End: 2022-01-29
  Administered 2022-01-29: 40 mg via ORAL
  Filled 2022-01-29: qty 1

## 2022-01-29 MED ORDER — EMPAGLIFLOZIN 10 MG PO TABS: 10.0000 mg | ORAL_TABLET | Freq: Every day | ORAL | 3 refills | Status: DC

## 2022-01-29 MED ORDER — POTASSIUM CHLORIDE CRYS ER 20 MEQ PO TBCR
40.0000 meq | EXTENDED_RELEASE_TABLET | Freq: Once | ORAL | Status: AC
  Administered 2022-01-29: 40 meq via ORAL
  Filled 2022-01-29: qty 2

## 2022-01-29 MED ORDER — METOPROLOL SUCCINATE ER 25 MG PO TB24: 12.5000 mg | ORAL_TABLET | Freq: Every day | ORAL | 2 refills | Status: DC

## 2022-01-29 NOTE — Progress Notes (Signed)
Occupational Therapy Treatment Patient Details Name: [name] MRN: [id number] DOB: 1940-11-07 Today's Date: 01/29/2022   History of present illness The pt is an 81 yo male presenting 7/15 with SOB and possible new onset afib. Upon work up, suspect multifocal atrial tachycardia complicated by persistent SOB with hypoxia. PMH includes: CAD s/p CABG, COPD, HLD, and DM II. CT cervical spine 2018: Severe degenerative disc disease at C6-7   OT comments  Pt eager to mobilize in hallway prior to discharge home today to ensure continued gains in strength and endurance. Pt opted to use RW, able to complete simulated ADLs/mobility with no more than Supervision. SpO2 >93% on RA throughout activity without SOB. Reinforced energy conservation strategies, fall prevention and recommended DME use at home. Pt/family plan to obtain shower chair after discharge.     Recommendations for follow up therapy are one component of a multi-disciplinary discharge planning process, led by the attending physician.  Recommendations may be updated based on patient status, additional functional criteria and insurance authorization.    Follow Up Recommendations  Home health OT    Assistance Recommended at Discharge Intermittent Supervision/Assistance  Patient can return home with the following  A little help with bathing/dressing/bathroom;Assistance with cooking/housework   Equipment Recommendations  BSC/3in1    Recommendations for Other Services      Precautions / Restrictions Precautions Precautions: Fall Precaution Comments: monitor O2 Restrictions Weight Bearing Restrictions: No       Mobility Bed Mobility Overal bed mobility: Modified Independent Bed Mobility: Supine to Sit     Supine to sit: Modified independent (Device/Increase time), HOB elevated          Transfers Overall transfer level: Needs assistance Equipment used: Rolling walker (2 wheels) Transfers: Sit to/from Stand Sit to  Stand: Supervision           General transfer comment: cues for hand placement with pt able to return demo for sit to stand without assist to RW     Balance Overall balance assessment: Needs assistance Sitting-balance support: No upper extremity supported, Feet supported Sitting balance-Leahy Scale: Good     Standing balance support: Bilateral upper extremity supported, During functional activity Standing balance-Leahy Scale: Fair                             ADL either performed or assessed with clinical judgement   ADL Overall ADL's : Needs assistance/impaired     Grooming: Sitting;Set up;Brushing hair               Lower Body Dressing: Supervision/safety;Sit to/from stand Lower Body Dressing Details (indicate cue type and reason): able to bend to reach B feet easily             Functional mobility during ADLs: Supervision/safety;Rolling walker (2 wheels) General ADL Comments: Pt eager to walk in hallway prior to DC to ensure adequate strength for DC home today. Pt's family present. Educated on energy conservation strategies and fall prevention during ADL routine. Discussed use of DME at home and mgmt. Pt able to mobilize in hallway with RW progressing to supervision though minor weakness/discomfort in L LE with mobility. SpO2 93% and above on RA - educated pt/family on monitoring vitals    Extremity/Trunk Assessment Upper Extremity Assessment Upper Extremity Assessment: Overall WFL for tasks assessed   Lower Extremity Assessment Lower Extremity Assessment: Defer to PT evaluation        Vision   Vision Assessment?: No  apparent visual deficits   Perception     Praxis      Cognition Arousal/Alertness: Awake/alert Behavior During Therapy: WFL for tasks assessed/performed Overall Cognitive Status: Within Functional Limits for tasks assessed                                          Exercises      Shoulder Instructions        General Comments VSS on RA. Pt's son present and cardiology at end of session    Pertinent Vitals/ Pain       Pain Assessment Pain Assessment: Faces Faces Pain Scale: Hurts a little bit Pain Location: LLE Pain Descriptors / Indicators: Grimacing Pain Intervention(s): Monitored during session  Home Living                                          Prior Functioning/Environment              Frequency  Min 2X/week        Progress Toward Goals  OT Goals(current goals can now be found in the care plan section)  Progress towards OT goals: Progressing toward goals  Acute Rehab OT Goals Patient Stated Goal: regain strength, go home today OT Goal Formulation: With patient Time For Goal Achievement: 02/08/22 Potential to Achieve Goals: Fair ADL Goals Pt Will Perform Upper Body Dressing: with set-up Pt Will Perform Lower Body Dressing: with set-up;sit to/from stand Pt Will Transfer to Toilet: ambulating;with supervision Pt Will Perform Toileting - Clothing Manipulation and hygiene: with supervision;sit to/from stand  Plan Discharge plan remains appropriate    Co-evaluation                 AM-PAC OT "6 Clicks" Daily Activity     Outcome Measure   Help from another person eating meals?: None Help from another person taking care of personal grooming?: A Little Help from another person toileting, which includes using toliet, bedpan, or urinal?: A Little Help from another person bathing (including washing, rinsing, drying)?: A Little Help from another person to put on and taking off regular upper body clothing?: A Little Help from another person to put on and taking off regular lower body clothing?: A Little 6 Click Score: 19    End of Session Equipment Utilized During Treatment: Gait belt;Rolling walker (2 wheels)  OT Visit Diagnosis: Unsteadiness on feet (R26.81);Muscle weakness (generalized) (M62.81)   Activity Tolerance Patient  tolerated treatment well   Patient Left in chair;with call bell/phone within reach;with family/visitor present;Other (comment) (MD in room)   Nurse Communication Mobility status        Time: [phone] OT Time Calculation (min): 25 min  Charges: OT General Charges $OT Visit: 1 Visit OT Treatments $Self Care/Home Management : 8-22 mins $Therapeutic Activity: 8-22 mins  Malachy Chamber, OTR/L Acute Rehab Services Office: [phone]   Layla Maw 01/29/2022, 1:01 PM

## 2022-01-29 NOTE — Discharge Summary (Addendum)
Name: [name] MRN: [id number] DOB: 1941/03/06 81 y.o. PCP: Lillard Anes, MD  Date of Admission: 01/24/2022 11:05 AM Date of Discharge: 01/29/2022 4:48 PM Attending Physician: Lalla Brothers, MD  Discharge Diagnosis: Principal Problem:   Acute exacerbation of CHF (congestive heart failure) ([place]) Active Problems:   Shortness of breath   Multifocal atrial tachycardia ([place])   Acute respiratory failure with hypoxia ([facility])   Mitral regurgitation   Discharge Medications: Allergies as of 01/29/2022       Reactions   Lipitor [atorvastatin Calcium] Other (See Comments)   Causes memory loss   Crestor [rosuvastatin Calcium] Other (See Comments)   Causes Muscle Pain   Zocor [simvastatin] Other (See Comments)   Causes muscle pain        Medication List     STOP taking these medications    isosorbide mononitrate 30 MG 24 hr tablet Commonly known as: IMDUR   lisinopril 5 MG tablet Commonly known as: ZESTRIL   metoprolol tartrate 25 MG tablet Commonly known as: LOPRESSOR   nitroGLYCERIN 0.4 MG SL tablet Commonly known as: NITROSTAT       TAKE these medications    cholecalciferol 25 MCG (1000 UNIT) tablet Commonly known as: VITAMIN D3 Take 5,000 Units by mouth daily.   clopidogrel 75 MG tablet Commonly known as: PLAVIX Take 1 tablet (75 mg total) by mouth daily. Start taking on: January 30, 2022   empagliflozin 10 MG Tabs tablet Commonly known as: JARDIANCE Take 1 tablet (10 mg total) by mouth daily. Start taking on: January 30, 2022   ezetimibe 10 MG tablet Commonly known as: ZETIA TAKE 1 TABLET BY MOUTH DAILY   furosemide 20 MG tablet Commonly known as: LASIX Take 1 tablet (20 mg total) by mouth daily.   Lancets Misc 1 each by Does not apply route daily as needed. Please provide lancets that fit patients lancet device   metFORMIN 500 MG tablet Commonly known as: GLUCOPHAGE Take 500 mg by mouth in the morning, at noon, in the evening, and at  bedtime.   metoprolol succinate 25 MG 24 hr tablet Commonly known as: TOPROL-XL Take 0.5 tablets (12.5 mg total) by mouth daily. Start taking on: January 30, 2022   multivitamin tablet Take 1 tablet by mouth daily.   OneTouch Ultra test strip Generic drug: glucose blood AS DIRECTED               Durable Medical Equipment  (From admission, onward)           Start     Ordered   01/29/22 1309  For home use only DME Shower stool  Once        01/29/22 1308   01/29/22 1205  For home use only DME Bedside commode  Once       Question:  Patient needs a bedside commode to treat with the following condition  Answer:  General weakness   01/29/22 1205            Disposition and follow-up:   [name] is a 81 y.o. year old male admitted to [facility] for acute exacerbation of heart failure with ejection fraction less than 20% and severe mitral regurgitation. They were discharged from [facility] on hospital day 4 in Stable condition.  At the hospital follow up visit please address:  HFrEF and mitral regurgitation GDMT was initiated while hospitalized, albeit slowly and cautiously to avoid hypotension.  Follow-up for mitral repair  once stabilized on GDMT. - Continue Jardiance 10 mg daily - Continue metoprolol succinate 12.5 mg daily - Start ARNI and MRA if BP will tolerate addition of more medication - Continue furosemide 20 mg as needed for 2 to 3 pound weight gain  CAD status post CABG Severe native coronary artery disease.  LIMA graft supplying the LAD is patent.  Vein graft supplying distal RCA is patent. - Continue Plavix 75 mg daily - Continue Zetia 10 mg daily  Labs / imaging needed at time of follow-up: - Repeat EKG - Repeat CMP - Repeat echocardiogram in 2 to 3 months  Follow-up Appointments:  Follow-up Information     Nahser, Wonda Cheng, MD. Go in 4 day(s).   Specialty: Cardiology Why: The office should contact you  within 1-2 business days to arrange for follow-up  If you do not hear from them within this timeframe, please call the number provided Contact information: [place] [address] [phone]         Lillard Anes, MD. Go in 5 day(s).   Specialty: Family Medicine Contact information: [address] [place] [address] [phone]         [facility]. Call.   Why: Bedside commode Contact information: [place] [address] [place]. Follow up.   Why: Physical Therapy-office to call with visit times. Contact information: [place] [address] [phone]         [facility]. Go in 12 day(s).   Specialty: Cardiology Why: Hospital follow up PLEASE bring current medication list to appointment FREE valet parking, Entrance C, off [employer] information: [id number] [place] [address] [phone]        Sherren Mocha, MD Follow up on 05/08/2022.   Specialty: Cardiology Why: You need to arrive at 10am for a 10:20 echocardiogram that will follow with an appointment with Dr. Burt Knack at 11:20am. Contact information: [address] [address] [phone]                Hospital Course by problem list:  Acute hypoxic respiratory failure Acute exacerbation of systolic heart failure with ejection fraction less than 20% Severe mitral regurgitation Patient presented with several weeks of shortness of breath and generalized weakness that worsened suddenly 5 days prior to presentation.  EKG showed multifocal atrial tachycardia chest x-ray was notable for interstitial pulmonary edema and mild bilateral pleural effusion.  BNP was elevated 1092.3.  Troponins were only mildly elevated and flat.  Trial of  diuretics was started.  Patient's condition worsened significantly overnight and chest x-ray reflected worsening pulmonary edema.  Lasix dose was increased to good effect and patient stabilized over the course of the next 4 days.  Lasix were discontinued after 10 L of charted urine output and some mild hypokalemia that was corrected with IV potassium.  TEE showed severe MR with intact subvalvular apparatus and EF less than 20%.  GDMT was cautiously initiated to avoid hypotension.  Patient was discharged on regimen of Jardiance, low-dose metoprolol, and as needed furosemide, with close follow-up with cardiology.  CAD status post CABG RHC/LHC during admission showed severe native coronary artery disease with patent LIMA graft supplying LAD distribution and patent vein graft supplying distal RCA distribution.  Home Plavix and  Zetia were continued for duration of admission.  It is possible that ACS precipitated this patient's symptoms a week prior to presentation.  However troponins were flat and EKG showed no signs of ischemia while he was admitted.  Patient was discharged on regimen of Plavix, Zetia, and low-dose metoprolol.  Transition of care from hospital to home Patient was discharged with home health physical therapy support, and DME including a shower stool and 3 in 1 commode.  New medication regimen was explained and follow-up appointments were set.  Discharge Exam:  Subjective: Patient feeling well today.  Still generally weak but eager to continue recovering at home.   Blood pressure 100/71, pulse 86, temperature 98 F (36.7 C), temperature source Oral, resp. rate 19, height '5\' 9"'$  (1.753 m), weight 61.3 kg, SpO2 (!) 3 %. General: Elderly male sitting upright in chair in no apparent acute distress. HEENT: Dentures Cardiovascular: Irregular rhythm.  Soft murmur heard best at the apex. Respiratory: Lungs clear.  Normal work of breathing. Extremities: Left radial pulse 2+. Skin: Warm and  dry. Neuro: Alert and oriented. Psych: Pleasant, appropriate mood and affect.  Pertinent studies and procedures:  Transesophageal echocardiogram: 1. Left ventricular ejection fraction, by estimation, is <20%. The left  ventricle has severely decreased function. The left ventricle demonstrates  global hypokinesis.   2. The mitral valve and the subvalvular apparatus appears in to be  intact. There is a noted highly mobile echogenic mass which appears to be  likely at the level of the papillary muscle. The mitral valve does not  coapt well which is consisitent with  mitral annulus dilatation. Secondary/atrial functional moderate to severe  mitral regurgitation. No evidence of mitral stenosis.   3. Right ventricular systolic function is moderately reduced. The right  ventricular size is normal.   4. No left atrial/left atrial appendage thrombus was detected.   5. Tricuspid valve regurgitation is mild to moderate.   6. The aortic valve is tricuspid. Aortic valve regurgitation is mild.  Aortic valve sclerosis is present, with no evidence of aortic valve  stenosis.  RHC/LHC: Left Anterior Descending    First Diagonal Branch  1st Diag lesion is 90% stenosed.    Left Circumflex  Prox Cx to Mid Cx lesion is 90% stenosed.    First Obtuse Marginal Branch  Vessel is small in size.    Second Obtuse Marginal Branch  2nd Mrg lesion is 90% stenosed.    Lateral Second Obtuse Marginal Branch  Lat 2nd Mrg lesion is 95% stenosed.    Right Coronary Artery  Prox RCA to Mid RCA lesion is 100% stenosed.    Right Posterior Descending Artery  There is mild disease in the vessel.    LIMA Graft To Mid LAD    Graft To Dist RCA    Graft To 1st Diag  Origin to Prox Graft lesion is 100% stenosed.    Graft To 2nd Mrg  Origin to Prox Graft lesion is 100% stenosed.       Discharge Instructions      [name]  You were admitted for shortness of breath and generalized weakness  and treated for hypoxic respiratory failure due to acute systolic heart failure and severe mitral regurgitation.  We are discharging you home now that you are doing better.  Because of your leaky mitral valve, your heart does not pump blood as efficiently as it once did.  This can cause you to feel weak and short of breath.  Medicines can help  improve your quality of life.  You will work closely with your cardiologist and primary care physician to maximize the benefits of your new medications while minimizing their side effects.  To help assist you on your road to recovery, I have written the following recommendations:   Please take your heart medications as prescribed:  - Start taking empagliflozin (Jardiance) 10 mg daily - Start taking metoprolol succinate (Toprol-XL) 12.5 mg daily - Start taking furosemide (Lasix) 20 mg daily as needed for 2 to 3 lbs weight gain or shortness of breath  - Continue taking your clopidogrel (Plavix) 75 mg daily - Continue taking ezetimibe (Zetia) 10 mg daily  - Stop taking isosorbide mononitrate (Imdur) - Stop taking lisinopril (Zestril) - Stop taking metoprolol tartrate (Lopressor) - Stop taking nitroglycerin (Nitrostat)  It is important to follow closely with your cardiologist and your primary care physician after you leave the hospital.  You have an appointment with Dr. Acie Fredrickson on Monday, February 02, 2022.  You have an appointment with Dr. Henrene Pastor on Tuesday, February 03, 2022.  Please call your doctor or return to the hospital for signs of worsening heart failure or respiratory failure including but not limited to: Severe shortness of breath, new or worsening chest pain, confusion, extreme fatigue or sleepiness, fainting, or heart palpitations.  It was a privilege to be a part of your hospital care team, and I hope you feel better as a result of your stay.  All the best,    Signed: Nani Gasser MD 01/29/2022, 4:48 PM   Pager: [phone]

## 2022-01-29 NOTE — Progress Notes (Signed)
Heart Failure Nurse Navigator Progress Note  PCP: Lillard Anes, MD PCP-Cardiologist: Nahser Admission Diagnosis: Atrial fibrillation with RVR, Shortness of breath. Admitted from: Home to Urgent care to ED  Presentation:   [name] presented with shortness of breath, fatigue, complaints of heart fluttering four days ago. Went to urgent care , they stated he was in A-fib, had him go to the ER here at [facility]. BP 113/72, HR 89, BNP 1,092, EKG showed A-fib. IV lasix , magnesium given and Cardizem drip ordered. Echo showed EF <20%, moderate to severe MR, with muti vessel stenosis, TEE preformed 01/28/22:shows EF <20 % with global hypokinesis. Plan for patient to stabilize for his severe LV dysfunction and follow his symptoms outpatient for potential  MitralClip procedure.   Patient and son educated on the sign and symptoms of Heart failure, daily weights, when to call his doctor or go to the ER. Diet/ fluid restrictions, (patient son has concern that his dad is not eating enough, due to his mom's advancing dementia. Stated his siblings and him will be helping with making sure they eat more healthier options and enough. ) Patient states he takes all his medications as prescribed and attends his medical appointments. ( One of his children take him to all his appointments. ) Both patient and son verbalized their understanding of the education, patient is scheduled for a hospital follow up appointment with HF TOC on 02/10/2022 @ 9 am.   ECHO/ LVEF: <20% G2DD  Clinical Course:  Past Medical History:  Diagnosis Date   Atherosclerotic heart disease of native coronary artery without angina pectoris 02/12/2010   Qualifier: Diagnosis of  By: Marland Mcalpine     Benign prostatic hyperplasia with lower urinary tract symptoms 12/04/2019   CAD (coronary artery disease)    Hx of    Cellulitis 03/19/2012   Chronic obstructive pulmonary disease ([place]) 12/04/2019   Diverticulosis of colon (without  mention of hemorrhage) 2011   Colonoscopy    Hiatal hernia 2011   EGD   Hypertension    Hypertensive heart disease without heart failure 12/04/2019   ITP (idiopathic thrombocytopenic purpura)    He has been diagnosed with   Pancytopenia    Thrombocytopenia, unspecified ([place]) 01/24/2013   TRANSAMINASES, SERUM, ELEVATED 02/12/2010   Qualifier: Diagnosis of  By: Marland Mcalpine     Type 2 diabetes mellitus with diabetic peripheral angiopathy without gangrene ([place]) 12/04/2019     Social History   Socioeconomic History   Marital status: Married    Spouse name: Doris   Number of children: 3   Years of education: Not on file   Highest education level: High school graduate  Occupational History   Occupation: Retired   Tobacco Use   Smoking status: Never   Smokeless tobacco: Never  Vaping Use   Vaping Use: Never used  Substance and Sexual Activity   Alcohol use: No   Drug use: No   Sexual activity: Not Currently  Other Topics Concern   Not on file  Social History Narrative   Daily caffeine use    Social Determinants of Health   Financial Resource Strain: Low Risk  (01/26/2022)   Overall Financial Resource Strain (CARDIA)    Difficulty of Paying Living Expenses: Not hard at all  Food Insecurity: No Food Insecurity (01/26/2022)   Hunger Vital Sign    Worried About Running Out of Food in the Last Year: Never true    Ran Out of Food in the Last  Year: Never true  Transportation Needs: No Transportation Needs (01/26/2022)   PRAPARE - Hydrologist (Medical): No    Lack of Transportation (Non-Medical): No  Physical Activity: Not on file  Stress: Not on file  Social Connections: Not on file   Education Assessment and Provision:  Detailed education and instructions provided on heart failure disease management including the following:  Signs and symptoms of Heart Failure When to call the physician Importance of daily weights Low sodium  diet Fluid restriction Medication management Anticipated future follow-up appointments  Patient education given on each of the above topics.  Patient acknowledges understanding via teach back method and acceptance of all instructions.  Education Materials:  "Living Better With Heart Failure" Booklet, HF zone tool, & Daily Weight Tracker Tool.  Patient has scale at home: Yes Patient has pill box at home: NA    High Risk Criteria for Readmission and/or Poor Patient Outcomes: Heart failure hospital admissions (last 6 months): 0  No Show rate: 1 % Difficult social situation: No Demonstrates medication adherence: Yes Primary Language: Yes Literacy level: Reading, writing, and comprehension.   Barriers of Care:   Diet/ fluid restrictions ( pt has lost around 20 lbs this summer, per son) Daily weights   Considerations/Referrals:   Referral made to Heart Failure Pharmacist Stewardship: Yes Referral made to Heart Failure CSW/NCM TOC: No Referral made to Heart & Vascular TOC clinic: Yes, 02/10/2022 @ 9 am.   Items for Follow-up on DC/TOC: Diet/ fluid restrictions (per son, pt has lost around 20 lbs this summer) Daily weights   Earnestine Leys, BSN, RN Heart Failure Transport planner Only

## 2022-01-29 NOTE — Progress Notes (Signed)
Patient given discharge paperwork that was prepared by his RN  Patient stated understanding.

## 2022-01-29 NOTE — Progress Notes (Addendum)
Progress Note  Patient Name: [name] Date of Encounter: 01/29/2022  [facility] Cardiologist: Mertie Moores, MD   Subjective   Patient denies chest pain, sob, palpitations. Feels very weak   Inpatient Medications    Scheduled Meds:  aspirin  81 mg Oral Daily   cholecalciferol  5,000 Units Oral Daily   clopidogrel  75 mg Oral Daily   empagliflozin  10 mg Oral Daily   enoxaparin (LOVENOX) injection  40 mg Subcutaneous Q24H   ezetimibe  10 mg Oral Daily   metoprolol succinate  12.5 mg Oral Daily   multivitamin with minerals  1 tablet Oral Daily   sodium chloride flush  3 mL Intravenous Q12H   Continuous Infusions:  sodium chloride     PRN Meds: sodium chloride, acetaminophen **OR** acetaminophen, acetaminophen, albuterol, diazepam, ondansetron (ZOFRAN) IV, ondansetron **OR** [DISCONTINUED] ondansetron (ZOFRAN) IV, phenol, senna-docusate, sodium chloride flush   Vital Signs    Vitals:   01/28/22 1450 01/28/22 1512 01/28/22 2040 01/29/22 0440  BP: (!) 96/57 105/71 (!) 93/57 100/71  Pulse: 91 100 92 86  Resp: '20 18 15 19  '$ Temp: 98.5 F (36.9 C) 98.4 F (36.9 C) (!) 97.3 F (36.3 C) 98 F (36.7 C)  TempSrc:  Oral Oral Oral  SpO2: 97% 93% 95% (!) 3%  Weight:    61.3 kg  Height:        Intake/Output Summary (Last 24 hours) at 01/29/2022 0934 Last data filed at 01/29/2022 0547 Gross per 24 hour  Intake 791.28 ml  Output 800 ml  Net -8.72 ml      01/29/2022    4:40 AM 01/28/2022    4:27 AM 01/27/2022    4:44 AM  Last 3 Weights  Weight (lbs) 135 lb 2.3 oz 128 lb 6.4 oz 138 lb 8 oz  Weight (kg) 61.3 kg 58.242 kg 62.823 kg      Telemetry    Sinus rhythm - Personally Reviewed  ECG    No new tracings - Personally Reviewed  Physical Exam   GEN: No acute distress. Sitting upright in the bed  Neck: No JVD Cardiac: RRR, faint systolic murmur at the apex  Respiratory: Decreased breath sounds in lung bases  GI: Soft, nontender, non-distended  MS: No  edema; No deformity. Neuro:  Nonfocal  Psych: Normal affect   Labs    High Sensitivity Troponin:   Recent Labs  Lab 01/24/22 2016 01/24/22 2215 01/25/22 0939 01/25/22 1141  TROPONINIHS 57* 59* 34* 37*     Chemistry Recent Labs  Lab 01/26/22 0348 01/26/22 1759 01/27/22 0337 01/28/22 0303 01/29/22 0208  NA 135   < > 135 136 136  K 3.7   < > 3.7 2.9* 3.8  CL 99  --  99 103 102  CO2 29  --  '23 26 27  '$ GLUCOSE 147*  --  142* 155* 157*  BUN 13  --  '13 11 14  '$ CREATININE 1.00  --  0.73 0.72 0.86  CALCIUM 8.8*  --  8.4* 8.4* 8.5*  MG 1.9  --   --  2.0 2.0  GFRNONAA >60  --  >60 >60 >60  ANIONGAP 7  --  '13 7 7   '$ < > = values in this interval not displayed.    Lipids No results for input(s): "CHOL", "TRIG", "HDL", "LABVLDL", "LDLCALC", "CHOLHDL" in the last 168 hours.  Hematology Recent Labs  Lab 01/26/22 0348 01/26/22 1759 01/26/22 1810 01/27/22 0337 01/28/22 0303  WBC  7.0  --   --  6.4 6.1  RBC 3.80*  --   --  3.68* 3.94*  HGB 12.1*   < > 11.6* 11.8* 12.5*  HCT 35.7*   < > 34.0* 33.8* 36.0*  MCV 93.9  --   --  91.8 91.4  MCH 31.8  --   --  32.1 31.7  MCHC 33.9  --   --  34.9 34.7  RDW 13.5  --   --  13.4 13.3  PLT 139*  --   --  92* 128*   < > = values in this interval not displayed.   Thyroid  Recent Labs  Lab 01/24/22 2016  TSH 2.684    BNP Recent Labs  Lab 01/24/22 1229  BNP 1,092.3*    DDimer No results for input(s): "DDIMER" in the last 168 hours.   Radiology    ECHO TEE  Result Date: 01/28/2022    TRANSESOPHOGEAL ECHO REPORT   Patient Name:   ORVEL CUTSFORTH [facility] Date of Exam: 01/28/2022 Medical Rec #:  [id number]        Height:       69.0 in Accession #:    [id number]       Weight:       128.4 lb Date of Birth:  1940/08/05        BSA:          28.711 m Patient Age:    61 years         BP:           107/74 mmHg Patient Gender: M                HR:           107 bpm. Exam Location:  Inpatient Procedure: Transesophageal Echo, Color Doppler and Cardiac  Doppler Indications:     Mitral regurgitation  History:         Patient has prior history of Echocardiogram examinations. CHF,                  CAD, Arrythmias:Atrial Fibrillation, Signs/Symptoms:Dyspnea;                  Risk Factors:Diabetes.  Sonographer:     Johny Chess RDCS Referring Phys:  [id number] Margie Billet Diagnosing Phys: Berniece Salines DO PROCEDURE: After discussion of the risks and benefits of a TEE, an informed consent was obtained from the patient. The transesophogeal probe was passed without difficulty through the esophogus of the patient. Imaged were obtained with the patient in a left lateral decubitus position. Sedation performed by different physician. The patient was monitored while under deep sedation. Anesthestetic sedation was provided intravenously by Anesthesiology: '228mg'$  of Propofol, '100mg'$  of Lidocaine. The patient developed no complications during the procedure. IMPRESSIONS  1. Left ventricular ejection fraction, by estimation, is <20%. The left ventricle has severely decreased function. The left ventricle demonstrates global hypokinesis.  2. The mitral valve and the subvalvular apparatus appears in to be intact. There is a noted highly mobile echogenic mass which appears to be likely at the level of the papillary muscle. The mitral valve does not coapt well which is consisitent with mitral annulus dilatation. Secondary/atrial functional moderate to severe mitral regurgitation. No evidence of mitral stenosis.  3. Right ventricular systolic function is moderately reduced. The right ventricular size is normal.  4. No left atrial/left atrial appendage thrombus was detected.  5. Tricuspid valve regurgitation is mild to moderate.  6. The  aortic valve is tricuspid. Aortic valve regurgitation is mild. Aortic valve sclerosis is present, with no evidence of aortic valve stenosis. FINDINGS  Left Ventricle: Left ventricular ejection fraction, by estimation, is <20%. The left ventricle has  severely decreased function. The left ventricle demonstrates global hypokinesis. The left ventricular internal cavity size was normal in size. Right Ventricle: The right ventricular size is normal. No increase in right ventricular wall thickness. Right ventricular systolic function is moderately reduced. Left Atrium: Left atrial size was not well visualized. No left atrial/left atrial appendage thrombus was detected. Right Atrium: Right atrial size was not well visualized. Pericardium: There is no evidence of pericardial effusion. Mitral Valve: The mitral valve and the subvalvular apparatus appears in to be intact. There is a noted highly mobile echogenic mass which appears to be likely at the level of the papillary muscle. The mitral valve does not coapt well which is consisitent  with mitral annulus dilatation. Secondary/atrial functional moderate to severe mitral regurgitation. No evidence of mitral valve stenosis. Tricuspid Valve: Tricuspid valve regurgitation is mild to moderate. Aortic Valve: The aortic valve is tricuspid. Aortic valve regurgitation is mild. Aortic valve sclerosis is present, with no evidence of aortic valve stenosis. Pulmonic Valve: The pulmonic valve was not well visualized. Pulmonic valve regurgitation is not visualized. No evidence of pulmonic stenosis. Aorta: The aortic root and ascending aorta are structurally normal, with no evidence of dilitation. Pulmonary Artery: The pulmonary artery is not well seen. Venous: The left upper pulmonary vein, left lower pulmonary vein, right upper pulmonary vein and right lower pulmonary vein are normal. A pattern of systolic flow reversal, suggestive of severe mitral regurgitation is recorded from the left lower pulmonary vein. The inferior vena cava was not well visualized. IAS/Shunts: No atrial level shunt detected by color flow Doppler.   AORTA Ao Asc diam: 3.10 cm MR Peak grad:    82.2 mmHg MR Mean grad:    47.2 mmHg MR Vmax:         453.25 cm/s MR  Vmean:        313.0 cm/s MR PISA:         2.26 cm MR PISA Eff ROA: 19 mm MR PISA Radius:  0.60 cm Kardie Tobb DO Electronically signed by Berniece Salines DO Signature Date/Time: 01/28/2022/3:29:19 PM    Final     Cardiac Studies   Echocardiogram 01/25/22  1. TTE with newly reduced LVEF of <20%, multiple wall motion  abnormalities and at least moderate-to-severe MR with concern for a torn  chordae. Recommend cath and TEE for further evaluation.   2. Left ventricular ejection fraction, by estimation, is <20%. The left  ventricle has severely decreased function. The left ventricle demonstrates  regional wall motion abnormalities (see scoring diagram/findings for  description). There is severe global  hypokinesis which appears worse in the anterior, septal and apical  segments. Left ventricular diastolic parameters are consistent with Grade  II diastolic dysfunction (pseudonormalization). Recommend cath for further  work-up.   3. There is a highly mobile, echobright density noted in the LV in the  subvalvular apparatus most concerning for a torn chordae given degree of  mitral regurgitation that has significantly progressed from prior study.  Recommend TEE for further  evaluation.   4. Right ventricular systolic function is mildly reduced. The right  ventricular size is normal. There is severely elevated pulmonary artery  systolic pressure. The estimated right ventricular systolic pressure is  78.2 mmHg.   5. Left atrial size was  moderately dilated.   6. The mitral valve is abnormal. Supect there is a torn chordae with at  least moderate-to-severe mitral regurgitation. By color flow doppler, the  degree of mitral regurgitation appears more moderate-to-severe, however,  there is systolic flow reversal in   the pulmonary vein which is more suggestive of severe mitral  regurgitation. Recommend TEE for further evaluation as above.   7. Tricuspid valve regurgitation is mild to moderate.   8. The  aortic valve is tricuspid. There is mild calcification of the  aortic valve. There is mild thickening of the aortic valve. Aortic valve  regurgitation is not visualized. Aortic valve sclerosis/calcification is  present, without any evidence of  aortic stenosis.   9. Aortic dilatation noted. There is borderline dilatation of the  ascending aorta, measuring 38 mm.  10. The inferior vena cava is normal in size with greater than 50%  respiratory variability, suggesting right atrial pressure of 3 mmHg.   Comparison(s): Compared to prior TTE on 12/16/20, the LVEF is now severely  reduced (previously 50-55%) with wall motion abnormalities as described  above. There is now at least moderate-to-severe MR with concern for a torn  chordae. Recommend cath and TEE  for further work-up.    R/L Heart Cath 7/17   1st Diag lesion is 90% stenosed.   Prox RCA to Mid RCA lesion is 100% stenosed.   Origin to Prox Graft lesion is 100% stenosed.   Prox Cx to Mid Cx lesion is 90% stenosed.   2nd Mrg lesion is 90% stenosed.   Lat 2nd Mrg lesion is 95% stenosed.   Origin to Prox Graft lesion is 100% stenosed.   Severe native coronary artery disease with significant coronary calcification.  The LAD is totally occluded after the first diagonal vessel and the first diagonal vessel has 90% ostial stenosis.   Left circumflex vessel has a 90% focal proximal stenosis and 90% stenosis in the marginal branch with distal 95% branch stenosis.   Right coronary artery is totally occluded proximally.   The LIMA graft supplying the LAD is widely patent.   The vein graft supplying the distal RCA is patent.  There is irregularity of the distal PD/PLA vasculature.   The vein graft which had supplied the diagonal vessel is totally occluded. (Old)   The vein graft had supplied the circumflex vessel is totally occluded. (Old)   Mildly elevated right heart pressures with mild pulmonary venous hypertension and PA pressure at  30 mmHg.   RECOMMENDATION: Patient will be undergoing TEE for further evaluation of mitral valve and valvular apparatus with concern for possible torn chordae on the transthoracic echo.  We will have colleagues review angiographic images with high-grade circumflex stenoses.   Diagnostic Dominance: Right    TEE 01/28/22  1. Left ventricular ejection fraction, by estimation, is <20%. The left  ventricle has severely decreased function. The left ventricle demonstrates  global hypokinesis.   2. The mitral valve and the subvalvular apparatus appears in to be  intact. There is a noted highly mobile echogenic mass which appears to be  likely at the level of the papillary muscle. The mitral valve does not  coapt well which is consisitent with  mitral annulus dilatation. Secondary/atrial functional moderate to severe  mitral regurgitation. No evidence of mitral stenosis.   3. Right ventricular systolic function is moderately reduced. The right  ventricular size is normal.   4. No left atrial/left atrial appendage thrombus was detected.   5. Tricuspid valve  regurgitation is mild to moderate.   6. The aortic valve is tricuspid. Aortic valve regurgitation is mild.  Aortic valve sclerosis is present, with no evidence of aortic valve  stenosis.   Patient Profile     81 y.o. male with CAD s/p CABG with PCI in 2007 subsequently, hypertension diabetes chronic diastolic heart failure with newly diagnosed systolic heart failure with a EF less than 20% and moderate to severe mitral regurgitation.  Sees Dr. Acie Fredrickson as outpatient  Assessment & Plan    Severe mitral regurgitation - Initial echocardiogram this admission showed EF <20%, moderate-severe Mr with concern for torn chordae  - Subsequent TEE also showed EF <20. Showed intact mitral valve and the subvalvular apparatus with a highly mobile echogenic mass felt to be at the level of the papillary muscle. Mitral valve does not coapt well-- consistent  with mitral annulus dilation consistent with secondary, severe MR - Seen by the structural heart team-- recommend stabilizing him on his GDMT for severe LV dysfunction and having potential MitraClip in the future   Acute on chronic systolic heart failure - Echocardiogram from May 2022 showed EF 50% with mild to moderate MR.   -- Repeat echo this admission showed ejection fraction less than 20%, multiple wall motion abnormalities, moderate-severe MR with concern for a torn chordae, severely elevated pulmonary artery pressure - Underwent R/L heart cath on 7/17 that showed severe native CAD with significant coronary calcification, total occlusions of the SVG-diag, SVG-LCX, patent LIMA and SVG-RCA. No targets for intervention. Mildly elevated right heart pressures with mild pulmonary venous HTN and PA pressure at 30 mmHg.  - Patient had been receiving on IV lasix 40 mg twice daily this admission-- was held yesterday due to low K  -- K supplemented yesterday and is up to 3.8 today-- Start PO lasix 40 mg daily with K supplementation  - Start metoprolol 12.5 mg daily-- titrate as able  - Unlikely BP will tolerate ACEi/ARB/ARNI. Can reassess in the future  -- Continue jardiance 10 mg daily    CAD status post CABG - With significant drop in EF, underwent cath to redefine anatomy. Findings as above  --Patient without chest pain  -Currently on ASA, plavix-- patient does not take ASA at home, stop ASA  -Continue with Zetia 10.  Did not tolerate statins --Metoprolol 12.5 mg daily    Statin intolerance - On Zetia   For questions or updates, please contact [place] Please consult [email or web address] for contact info under        Signed, Margie Billet, PA-C  01/29/2022, 9:34 AM    Personally seen and examined. Agree with above.  Okay with discharge.  Functional MR.  We will see how medication management helps.  He has close follow-up with Dr. Acie Fredrickson.  Metoprolol succinate 12.5 mg, very  low-dose has been started.  It may be beneficial to place him on low-dose Lasix 20 mg on Monday follow-up with Dr. Acie Fredrickson.  Daily weights would be helpful.  Take Lasix if 2 to 3 pounds up.  Repeat echocardiogram in 2 to 3 months.  EF was recently normal.  Most current ejection fraction markedly reduced.  Multifocal atrial tachycardia noted on telemetry.  Not atrial fibrillation.  Candee Furbish, MD

## 2022-01-29 NOTE — Discharge Instructions (Addendum)
[name]  You were admitted for shortness of breath and generalized weakness and treated for hypoxic respiratory failure due to acute systolic heart failure and severe mitral regurgitation.  We are discharging you home now that you are doing better.  Because of your leaky mitral valve, your heart does not pump blood as efficiently as it once did.  This can cause you to feel weak and short of breath.  Medicines can help improve your quality of life.  You will work closely with your cardiologist and primary care physician to maximize the benefits of your new medications while minimizing their side effects.  To help assist you on your road to recovery, I have written the following recommendations:   Please take your heart medications as prescribed:  - Start taking empagliflozin (Jardiance) 10 mg daily - Start taking metoprolol succinate (Toprol-XL) 12.5 mg daily - Start taking furosemide (Lasix) 20 mg daily as needed for 2 to 3 lbs weight gain or shortness of breath  - Continue taking your clopidogrel (Plavix) 75 mg daily - Continue taking ezetimibe (Zetia) 10 mg daily  - Stop taking isosorbide mononitrate (Imdur) - Stop taking lisinopril (Zestril) - Stop taking metoprolol tartrate (Lopressor) - Stop taking nitroglycerin (Nitrostat)  It is important to follow closely with your cardiologist and your primary care physician after you leave the hospital.  You have an appointment with Dr. Acie Fredrickson on Monday, February 02, 2022.  You have an appointment with Dr. Henrene Pastor on Tuesday, February 03, 2022.  Please call your doctor or return to the hospital for signs of worsening heart failure or respiratory failure including but not limited to: Severe shortness of breath, new or worsening chest pain, confusion, extreme fatigue or sleepiness, fainting, or heart palpitations.  It was a privilege to be a part of your hospital care team, and I hope you feel better as a result of your stay.  All the best,

## 2022-01-29 NOTE — TOC Initial Note (Signed)
Transition of Care [facility]) - Initial/Assessment Note    Patient Details  Name: [name] MRN: [id number] Date of Birth: 26-Apr-1941  [facility]) CM/SW Contact:    Bethena Roys, RN Phone Number: 01/29/2022, 12:32 PM  Clinical Narrative:  Case Manager received a chat from the RN regarding DME. PT did not recommend any DME needs during th visit. Family is asking for a bedside commode. DME ordered via [employer] and will be delivered to the room prior to transition home. Patient recently declined home health services and now is agreeable to services. Family did not have a preference for agency. Case Manager called [facility]-unable to service in the area. Case Manager called [place] and Nanine Means. Latricia Heft can service the patient and services will begin within 24-48 hours post transition home. Son is at the bedside for transportation home. No further needs identified at this time.              Expected Discharge Plan: [place] Barriers to Discharge: No Barriers Identified   Patient Goals and CMS Choice Patient states their goals for this hospitalization and ongoing recovery are:: to return home   Choice offered to / list presented to : Adult Children (No preference would like to be in network)  Expected Discharge Plan and Services Expected Discharge Plan: [place] In-house Referral: NA Discharge Planning Services: CM Consult Post Acute Care Choice: [place] arrangements for the past 2 months: Single Family Home Expected Discharge Date: 01/29/22               DME Arranged: Bedside commode DME Agency: [employer] Date DME Agency Contacted: 01/29/22 Time DME Agency Contacted: 1200 Representative spoke with at DME Agency: [place]: PT [place]: [place]   Time [place]: 70 Representative spoke with at [place]: [place] Arrangements/Services Living  arrangements for the past 2 months: [place] with:: Spouse Patient language and need for interpreter reviewed:: Yes Do you feel safe going back to the place where you live?: Yes      Need for Family Participation in Patient Care: Yes (Comment) Care giver support system in place?: Yes (comment) Current home services: DME (has cane and rolling walker) Criminal Activity/Legal Involvement Pertinent to Current Situation/Hospitalization: No - Comment as needed  Activities of Daily Living Home Assistive Devices/Equipment: Cane (specify quad or straight) (straight) ADL Screening (condition at time of admission) Patient's cognitive ability adequate to safely complete daily activities?: Yes Is the patient deaf or have difficulty hearing?: Yes Does the patient have difficulty seeing, even when wearing glasses/contacts?: No Does the patient have difficulty concentrating, remembering, or making decisions?: No Patient able to express need for assistance with ADLs?: No Does the patient have difficulty dressing or bathing?: No Independently performs ADLs?: Yes (appropriate for developmental age) Does the patient have difficulty walking or climbing stairs?: No Weakness of Legs: None Weakness of Arms/Hands: None  Permission Sought/Granted Permission sought to share information with : Case Manager, Customer service manager, Family Supports Permission granted to share information with : Yes, Verbal Permission Granted     Permission granted to share info w AGENCY: [facility]        Emotional Assessment Appearance:: Appears stated age Attitude/Demeanor/Rapport: Engaged Affect (typically observed): Appropriate Orientation: : Oriented to Self, Oriented to Place, Oriented to  Time, Oriented to Situation Alcohol / Substance Use: Not Applicable Psych Involvement: No (comment)  Admission  diagnosis:  SOB (shortness of breath) [R06.02] New onset a-fib ([place]) [I48.91] Atrial fibrillation  with RVR (HCC) [I48.91] Patient Active Problem List   Diagnosis Date Noted   Mitral regurgitation 01/26/2022   Acute respiratory failure with hypoxia ([place]) 01/25/2022   Shortness of breath 01/24/2022   Multifocal atrial tachycardia ([place]) 01/24/2022   Diabetes ([place]) 10/21/2021   Heart disease 10/21/2021   Fall at home 10/08/2021   Generalized weakness 11/14/2020   Statin intolerance 04/15/2020   Diabetic polyneuropathy ([place]) 12/05/2019   BMI 21.0-21.9, adult 12/05/2019   Benign prostatic hyperplasia with lower urinary tract symptoms 12/04/2019   Type 2 diabetes mellitus with diabetic peripheral angiopathy without gangrene ([place]) 12/04/2019   Chronic obstructive pulmonary disease ([place]) 12/04/2019   Mixed hyperlipidemia 01/29/2017   Idiopathic thrombocytopenic purpura ([place]) 01/26/2012   Acute exacerbation of CHF (congestive heart failure) ([place]) 09/29/2011   Benign essential HTN 07/08/2010   DIVERTICULOSIS-COLON 05/12/2010   Diaphragmatic hernia 03/25/2010   Coronary artery disease of native artery of native heart with stable angina pectoris ([place])    PCP:  Lillard Anes, MD Pharmacy:   [place], [address] [place] [address] Phone: [phone] Fax: [phone]     Social Determinants of Health (SDOH) Interventions Food Insecurity Interventions: Intervention Not Indicated Financial Strain Interventions: Intervention Not Indicated Housing Interventions: Intervention Not Indicated Transportation Interventions: Intervention Not Indicated  Readmission Risk Interventions     No data to display

## 2022-01-29 NOTE — Care Management (Signed)
    Durable Medical Equipment  (From admission, onward)           Start     Ordered   01/29/22 1205  For home use only DME Bedside commode  Once       Question:  Patient needs a bedside commode to treat with the following condition  Answer:  General weakness   01/29/22 1205

## 2022-01-29 NOTE — Care Management Important Message (Signed)
Important Message  Patient Details  Name: JAYDAN MEIDINGER MRN: [id number] Date of Birth: March 22, 1941   Medicare Important Message Given:  Yes     Shelda Altes 01/29/2022, 10:11 AM

## 2022-01-29 NOTE — Progress Notes (Signed)
Pt discharged to home. DC instructions given with son at bedside. No concerns voiced. Pt left unit in wheelchair pushed by float RN. Left in stable condition.

## 2022-01-30 ENCOUNTER — Telehealth: Payer: Self-pay

## 2022-01-30 DIAGNOSIS — I25708 Atherosclerosis of coronary artery bypass graft(s), unspecified, with other forms of angina pectoris: Secondary | ICD-10-CM | POA: Diagnosis not present

## 2022-01-30 DIAGNOSIS — Z7902 Long term (current) use of antithrombotics/antiplatelets: Secondary | ICD-10-CM | POA: Diagnosis not present

## 2022-01-30 DIAGNOSIS — K449 Diaphragmatic hernia without obstruction or gangrene: Secondary | ICD-10-CM | POA: Diagnosis not present

## 2022-01-30 DIAGNOSIS — I340 Nonrheumatic mitral (valve) insufficiency: Secondary | ICD-10-CM | POA: Diagnosis not present

## 2022-01-30 DIAGNOSIS — E559 Vitamin D deficiency, unspecified: Secondary | ICD-10-CM | POA: Diagnosis not present

## 2022-01-30 DIAGNOSIS — I110 Hypertensive heart disease with heart failure: Secondary | ICD-10-CM | POA: Diagnosis not present

## 2022-01-30 DIAGNOSIS — I480 Paroxysmal atrial fibrillation: Secondary | ICD-10-CM | POA: Diagnosis not present

## 2022-01-30 DIAGNOSIS — E785 Hyperlipidemia, unspecified: Secondary | ICD-10-CM | POA: Diagnosis not present

## 2022-01-30 DIAGNOSIS — I5032 Chronic diastolic (congestive) heart failure: Secondary | ICD-10-CM | POA: Diagnosis not present

## 2022-01-30 DIAGNOSIS — K5730 Diverticulosis of large intestine without perforation or abscess without bleeding: Secondary | ICD-10-CM | POA: Diagnosis not present

## 2022-01-30 NOTE — Telephone Encounter (Signed)
Per [employer] review of 01/28/2022 TEE: "For patient TK: This is Secondary MR and does not require a surgical consult   This valve is suitable for a MitraClip implant. The fossa looks approachable for transseptal puncture in the SAXB and Bicaval views. LA dimensions are large enough for device steering and straddle. The posterior leaflet measures 0.964 cm in the LVOT grasping view(s). MVA measures 6.27cm2 via planimetry & gradient measures 60mhg at a HR of 106bpm. Defect appears to be on the medial aspect of A2/P2. I'd like to verify both MVA and gradient in the case prior to the start. Based on this information, I would plan to start with 1 NTW just medial of A2/P2 and reassess.   TR noted"

## 2022-02-01 ENCOUNTER — Encounter: Payer: Self-pay | Admitting: Cardiovascular Disease

## 2022-02-01 NOTE — Progress Notes (Signed)
Cardiology Office Note  Date:  02/02/2022   ID:  [name], [name] 22-Mar-1941, MRN [id number]  PCP:  Lillard Anes, MD  Cardiologist:   Mertie Moores, MD   Chief Complaint  Patient presents with   Coronary Artery Disease        Congestive Heart Failure        1. Coronary artery disease-status post MI and eventually s/p  CABG  ( 2003) and also stenting ( 2007) 2. Hyperlipidemia-he has been generally intolerant of statins-Crestor causes muscle pain, Lipitor causes memory loss, Zocor causes muscle pain. 2. Diabetes mellitus 3.  Chronic diastolic CHF:   [name] is an 81 year old gentleman with a history of coronary artery disease. He status post coronary artery bypass grafting. He is also status post PTCA and stenting. He also has a  history of hyperlipidemia and diabetes mellitus.   He is exercising regularly .  He does lots of yard work.  He enjoys going to his beach house.  January 05, 2013:  [name] is doing well.  His garden is doing well.  Occasional CP and dyspnea.  Pains last only for a few seconds.   Does not have to take a NTG.  Mows the lawn and does yard work without problems.    Dec. 16, 2014:  [name] is doing well.  Doing yard work without problems.    Occasional episodes  December 12, 2013:  [name] is doing ok.    Rare CP .  Frequent head aches  Dec. 1 , 2015:  No cardiac complaints.   Aches all over.  "staggers "  when he walks or stands. No angina.    He notices  That he has occasional episodes of dyspnea.     December 13, 2014:  [name] is a 81 y.o. male who presents for his  CAD. Very rare CP.  Has some DOE climbing up his hill at his house.  Nov. 29, 2016:  [name] continues to have some chest pain .    Seems to start in his lower abdomen and moves up. Will last for several days.  NTG works at times and at other times, has no effect.  Exercising some.   Fatigues easily .  Has leg pain with walking .   Thinks the diabetes  has a lot to do with his fatigue . Glucose levels are ok  Goes to his beach house in [place]   Nov 12, 2015:  Doing well from a cardiac standpoint. Got dizzy - does not know if he had a syncopal episode. Had not been out working much. He thinks he may have tripped on the step   Oct. 30 , 2017:  Doing well. No CP or dyspnea  Has had some sinus issues,  Went to [facility] ,   Hx of CAD - intolerant to statins  - lots of muscle aches   January 28, 2017:  [name] is seen today . Seems to be stable.   Still has a little CP with walking up hills but this seems to be stable .  Does not tolerate statins Is on Niacin,   Last lipids level looks good.   Feb. 8, 2019  [name] is seen back today  No CP , Has some knots on his legs.     Oct. 22, 2019: Seen wife wife today --  No CP or dyspnea. Has episodes of hypoglycemia and has subsequent dizziness.  Still  moderately active.    Has some DOE working outside.  Legs and feet hurt him frequently  - has arthritis   May 10, 2019:  [name] is seen today for follow-up visit of his coronary artery disease and congestive heart failure.  I saw him via telemedicine visit in April.  No cp or dyspnea.  Able to do all of his normal activities.    Nov 14, 2020: [name] is seen for follow of up his CAD, COPD  Is falling some,  Encouraged him to use a cane or walker  Is able to get some exercise ,     Nov 11, 2021 [name] is seen for follow up of his CAD Has poor appetite, loss of energy  Echo from 2022 showed normal LV functon  Has slowed generally over the past several years  Has fallen several times over the past several years   February 02, 2022: [name] is seen today for follow-up visit.  He has a history of coronary artery disease, COPD and CHF. He was recently hospitalized with heart failure.  He was found to have an LVEF of less than 20%.  He had moderate to severe mitral regurgitation.  The mitral regurgitation appears to be  secondary/functional regurgitation.  The plan was to continue guided medical therapy including diuresis and other standard medications.  Hopefully his mitral regurgitation will improve as his LVEF improves.  His wt has steadily gone down during the DC From 133 - 130  Has been taking lasix 20 mg a day    Past Medical History:  Diagnosis Date   Atherosclerotic heart disease of native coronary artery without angina pectoris 02/12/2010   Qualifier: Diagnosis of  By: Marland Mcalpine     Benign prostatic hyperplasia with lower urinary tract symptoms 12/04/2019   CAD (coronary artery disease)    Hx of    Cellulitis 03/19/2012   Chronic obstructive pulmonary disease ([place]) 12/04/2019   Diverticulosis of colon (without mention of hemorrhage) 2011   Colonoscopy    Hiatal hernia 2011   EGD   Hypertension    Hypertensive heart disease without heart failure 12/04/2019   ITP (idiopathic thrombocytopenic purpura)    He has been diagnosed with   Pancytopenia    Thrombocytopenia, unspecified ([place]) 01/24/2013   TRANSAMINASES, SERUM, ELEVATED 02/12/2010   Qualifier: Diagnosis of  By: Marland Mcalpine     Type 2 diabetes mellitus with diabetic peripheral angiopathy without gangrene ([place]) 12/04/2019    Past Surgical History:  Procedure Laterality Date   CARDIAC CATHETERIZATION     His last heart catheterization in May of 2009 reveals a patent LIMA   CORONARY ANGIOPLASTY     Successful percutaneous transluminal coronary angioplasty of the left circumflex obuse marginal vessel   CORONARY ANGIOPLASTY WITH STENT PLACEMENT     CORONARY ARTERY BYPASS GRAFT     x4 with a left internal mammary artery anastomosis to the left anterior descending coronary artery    CORONARY STENT PLACEMENT     successful percutaneous transluminal coronary angioplasty and stenting of the left main coronary artery   HERNIA REPAIR     RIGHT/LEFT HEART CATH AND CORONARY/GRAFT ANGIOGRAPHY N/A 01/26/2022   Procedure:  RIGHT/LEFT HEART CATH AND CORONARY/GRAFT ANGIOGRAPHY;  Surgeon: Troy Sine, MD;  Location: [facility];  Service: Cardiovascular;  Laterality: N/A;   SAPHENOUS VEIN GRAFT RESECTION     graft to the first obtuse marginal, a saphenous vein graft to the first diagonal coronary artery  and a saphenous vein graft to the distal right coronary artery   TEE WITHOUT CARDIOVERSION N/A 01/28/2022   Procedure: TRANSESOPHAGEAL ECHOCARDIOGRAM (TEE);  Surgeon: Berniece Salines, DO;  Location: [facility];  Service: Cardiovascular;  Laterality: N/A;   US ECHOCARDIOGRAPHY  02-08-2007   Est. EF 40-45%     Current Outpatient Medications  Medication Sig Dispense Refill   cholecalciferol (VITAMIN D3) 25 MCG (1000 UT) tablet Take 5,000 Units by mouth daily.     clopidogrel (PLAVIX) 75 MG tablet Take 1 tablet (75 mg total) by mouth daily. 90 tablet 3   empagliflozin (JARDIANCE) 10 MG TABS tablet Take 1 tablet (10 mg total) by mouth daily. 30 tablet 3   ezetimibe (ZETIA) 10 MG tablet TAKE 1 TABLET BY MOUTH DAILY 90 tablet 0   Lancets MISC 1 each by Does not apply route daily as needed. Please provide lancets that fit patients lancet device 100 each 4   metFORMIN (GLUCOPHAGE) 500 MG tablet Take 500 mg by mouth in the morning, at noon, in the evening, and at bedtime.     metoprolol succinate (TOPROL-XL) 25 MG 24 hr tablet Take 0.5 tablets (12.5 mg total) by mouth daily. 30 tablet 2   Multiple Vitamin (MULTIVITAMIN) tablet Take 1 tablet by mouth daily.     nitroGLYCERIN (NITROSTAT) 0.4 MG SL tablet Place 1 tablet (0.4 mg total) under the tongue every 5 (five) minutes as needed for chest pain. 90 tablet 3   ONETOUCH ULTRA test strip AS DIRECTED 100 each 4   spironolactone (ALDACTONE) 25 MG tablet Take 1 tablet (25 mg total) by mouth daily. 90 tablet 3   No current facility-administered medications for this visit.    Allergies:   Lipitor [atorvastatin calcium], Crestor [rosuvastatin calcium], and Zocor  [simvastatin]    Social History:  The patient  reports that he has never smoked. He has never used smokeless tobacco. He reports that he does not drink alcohol and does not use drugs.   Family History:  The patient's family history includes Cerebrovascular Accident in his mother.   ROS: Noted in current history, all other systems are negative.Marland Kitchen    Physical Exam: Blood pressure 108/66, pulse 85, height '5\' 9"'$  (1.753 m), weight 132 lb 6.4 oz (60.1 kg), SpO2 99 %.  GEN: Thin, elderly, chronically ill-appearing gentleman, no acute distress. HEENT: Normal NECK: No JVD; No carotid bruits LYMPHATICS: No lymphadenopathy CARDIAC: ireg. Irreg. , 2/6 systolic ejection murmur at the left sternal border radiating to the axilla. RESPIRATORY:  Clear to auscultation without rales, wheezing or rhonchi  ABDOMEN: Soft, non-tender, non-distended MUSCULOSKELETAL: Trace ankle edema no deformity  SKIN: Warm and dry NEUROLOGIC:  Alert and oriented x 3    EKG:       February 02, 2022: Sinus rhythm with frequent premature atrial contractions and premature ventricular contractions.  Heart rate is 109. It is difficult to see P waves at some time but it does appear that he is in sinus rhythm with various premature beats and not atrial fibrillation. .  Recent Labs: 09/17/2021: ALT 12 01/24/2022: B Natriuretic Peptide 1,092.3; TSH 2.684 01/28/2022: Hemoglobin 12.5; Platelets 128 01/29/2022: BUN 14; Creatinine, Ser 0.86; Magnesium 2.0; Potassium 3.8; Sodium 136   Lipid Panel    Component Value Date/Time   CHOL 157 09/17/2021 0836   TRIG 71 09/17/2021 0836   HDL 66 09/17/2021 0836   CHOLHDL 2.4 09/17/2021 0836   CHOLHDL 2.7 05/11/2016 1002   VLDL 18 05/11/2016 1002   LDLCALC 77  09/17/2021 0836      Wt Readings from Last 3 Encounters:  02/02/22 132 lb 6.4 oz (60.1 kg)  01/29/22 135 lb 2.3 oz (61.3 kg)  11/11/21 142 lb (64.4 kg)      Other studies Reviewed: Additional studies/ records that were reviewed  today include: . Review of the above records demonstrates:    ASSESSMENT AND PLAN:  1. Coronary artery disease-   we will give him a prescription for nitroglycerin to use on an as-needed basis.     2. Hyperlipidemia-    stable for now.   .  3. Diabetes mellitus -     4. Weakness:        5. CHF-[name] presents for further management of his congestive heart failure and severe mitral regurgitation.  He was just in the hospital a week ago.  His isosorbide and nitroglycerin were discontinued.  He is on Lasix 20 mg a day.  He is lost several pounds over the last 3 days.  He is overall fairly weak.  I encouraged him to eat more protein.  I think he is relatively euvolemic at this point. We will discontinue the Lasix and start him on spironolactone 25 mg a day.  He will hold the Lasix and will use it if he gains 2 to 3 pounds over a day or 5 pounds over the course of a week.  He is scheduled to see the CHF clinic next week.  I will defer further decisions to the CHF clinic.  We will let him follow-up with the CHF and I will see him back on an as-needed basis.   The following changes have been made:  no change  Labs/ tests ordered today include:   Orders Placed This Encounter  Procedures   Basic metabolic panel   EKG [id number]     Disposition:      Mertie Moores, MD  02/02/2022 6:03 PM    [facility] [address], [place], [place]  [address] Phone: [phone]; Fax: [phone]

## 2022-02-02 ENCOUNTER — Encounter: Payer: Self-pay | Admitting: Cardiovascular Disease

## 2022-02-02 ENCOUNTER — Encounter: Payer: Self-pay | Admitting: Legal Medicine

## 2022-02-02 ENCOUNTER — Ambulatory Visit: Payer: Medicare HMO | Admitting: Cardiovascular Disease

## 2022-02-02 VITALS — BP 108/66 | HR 85 | Ht 69.0 in | Wt 132.4 lb

## 2022-02-02 DIAGNOSIS — I5032 Chronic diastolic (congestive) heart failure: Secondary | ICD-10-CM | POA: Diagnosis not present

## 2022-02-02 MED ORDER — SPIRONOLACTONE 25 MG PO TABS: 25.0000 mg | ORAL_TABLET | Freq: Every day | ORAL | 3 refills | Status: DC

## 2022-02-02 MED ORDER — NITROGLYCERIN 0.4 MG SL SUBL: 0.4000 mg | SUBLINGUAL_TABLET | SUBLINGUAL | 3 refills | Status: DC | PRN

## 2022-02-02 NOTE — Patient Instructions (Signed)
Medication Instructions:  Nitroglycerin 0.'4mg'$  as needed Spironolactone '25mg'$  daily  Stop taking lasix  *If you need a refill on your cardiac medications before your next appointment, please call your pharmacy*   Lab Work: Bmet today If you have labs (blood work) drawn today and your tests are completely normal, you will receive your results only by: [place] (if you have MyChart) OR A paper copy in the mail If you have any lab test that is abnormal or we need to change your treatment, we will call you to review the results.   Follow-Up: At [facility], you and your health needs are our priority.  As part of our continuing mission to provide you with exceptional heart care, we have created designated Provider Care Teams.  These Care Teams include your primary Cardiologist (physician) and Advanced Practice Providers (APPs -  Physician Assistants and Nurse Practitioners) who all work together to provide you with the care you need, when you need it.  We recommend signing up for the patient portal called "MyChart".  Sign up information is provided on this After Visit Summary.  MyChart is used to connect with patients for Virtual Visits (Telemedicine).  Patients are able to view lab/test results, encounter notes, upcoming appointments, etc.  Non-urgent messages can be sent to your provider as well.   To learn more about what you can do with MyChart, go to [email or web address].    Your next appointment:    As scheduled with HF Clinic

## 2022-02-02 NOTE — Progress Notes (Unsigned)
Subjective:  Patient ID: [name], male    DOB: 02-16-1941  Age: 81 y.o. MRN: [id number]  Chief Complaint  Patient presents with   Diabetes   Hypertension   Hyperlipidemia    HPI  Patient present with type 2 diabetes. Compliance with treatment has been good; patient take medicines as directed, maintains diet and exercise regimen, follows up as directed, and is keeping glucose diary. Current medicines for diabetes Jardiance 10 mg daily, Metformin 500 mg four times daily.  Patient performs foot exams daily and last ophthalmologic exam was January 2023 Dr. Renaldo Fiddler.. Last A1C was 6.1%.  Patient presents with hyperlipidemia.  Compliance with treatment has been good; patient takes medicines as directed, maintains low cholesterol diet, follows up as directed, and maintains exercise regimen.  Patient is using Zetia 10 mg daily without problems.   Patient presents for follow up of hypertension.  Patient tolerating Metoprolol 25 mg 1/2 tablet daily, Spironolactone 25 mg daily. Patient is working on maintaining diet and exercise regimen and follows up as directed.   Patient was admitted to [facility] from 01/24/2022 to 01/29/2022 for Acute exacerbation of CHF and new onset of Afib. His ejection fraction less than 20%  and severe mitral regurgitation. He was discharged stable. They changed medication started Spironolactone and stop Furosemide.  Copy of last cath: Last cath:1st Diag lesion is 90% stenosed.   Prox RCA to Mid RCA lesion is 100% stenosed.   Origin to Prox Graft lesion is 100% stenosed.   Prox Cx to Mid Cx lesion is 90% stenosed.   2nd Mrg lesion is 90% stenosed.   Lat 2nd Mrg lesion is 95% stenosed.   Origin to Prox Graft lesion is 100% stenosed.   Severe native coronary artery disease with significant coronary calcification.  The LAD is totally occluded after the first diagonal vessel and the first diagonal vessel has 90% ostial stenosis.   Left circumflex  vessel has a 90% focal proximal stenosis and 90% stenosis in the marginal branch with distal 95% branch stenosis.   Right coronary artery is totally occluded proximally.   The LIMA graft supplying the LAD is widely patent.   The vein graft supplying the distal RCA is patent.  There is irregularity of the distal PD/PLA vasculature.   The vein graft which had supplied the diagonal vessel is totally occluded. (Old)   The vein graft had supplied the circumflex vessel is totally occluded. (Old)   Mildly elevated right heart pressures with mild pulmonary venous hypertension and PA pressure at 30 mmHg.   RECOMMENDATION: Patient will be undergoing TEE for further evaluation of mitral valve  Current Outpatient Medications on File Prior to Visit  Medication Sig Dispense Refill   cholecalciferol (VITAMIN D3) 25 MCG (1000 UT) tablet Take 5,000 Units by mouth daily.     clopidogrel (PLAVIX) 75 MG tablet Take 1 tablet (75 mg total) by mouth daily. 90 tablet 3   empagliflozin (JARDIANCE) 10 MG TABS tablet Take 1 tablet (10 mg total) by mouth daily. 30 tablet 3   ezetimibe (ZETIA) 10 MG tablet TAKE 1 TABLET BY MOUTH DAILY 90 tablet 0   Lancets MISC 1 each by Does not apply route daily as needed. Please provide lancets that fit patients lancet device 100 each 4   metFORMIN (GLUCOPHAGE) 500 MG tablet Take 500 mg by mouth in the morning, at noon, in the evening, and at bedtime.     metoprolol succinate (TOPROL-XL) 25 MG 24 hr  tablet Take 0.5 tablets (12.5 mg total) by mouth daily. 30 tablet 2   Multiple Vitamin (MULTIVITAMIN) tablet Take 1 tablet by mouth daily.     nitroGLYCERIN (NITROSTAT) 0.4 MG SL tablet Place 1 tablet (0.4 mg total) under the tongue every 5 (five) minutes as needed for chest pain. 90 tablet 3   ONETOUCH ULTRA test strip AS DIRECTED 100 each 4   spironolactone (ALDACTONE) 25 MG tablet Take 1 tablet (25 mg total) by mouth daily. 90 tablet 3   No current facility-administered  medications on file prior to visit.   Past Medical History:  Diagnosis Date   Acute exacerbation of CHF (congestive heart failure) ([place]) 09/29/2011   Atherosclerotic heart disease of native coronary artery without angina pectoris 02/12/2010   Qualifier: Diagnosis of  By: Marland Mcalpine     Benign prostatic hyperplasia with lower urinary tract symptoms 12/04/2019   CAD (coronary artery disease)    Hx of    Cellulitis 03/19/2012   Chronic obstructive pulmonary disease ([place]) 12/04/2019   Diverticulosis of colon (without mention of hemorrhage) 2011   Colonoscopy    Hiatal hernia 2011   EGD   Hypertension    Hypertensive heart disease without heart failure 12/04/2019   ITP (idiopathic thrombocytopenic purpura)    He has been diagnosed with   Pancytopenia    Thrombocytopenia, unspecified ([place]) 01/24/2013   TRANSAMINASES, SERUM, ELEVATED 02/12/2010   Qualifier: Diagnosis of  By: Marland Mcalpine     Type 2 diabetes mellitus with diabetic peripheral angiopathy without gangrene ([place]) 12/04/2019   Past Surgical History:  Procedure Laterality Date   CARDIAC CATHETERIZATION     His last heart catheterization in May of 2009 reveals a patent LIMA   CORONARY ANGIOPLASTY     Successful percutaneous transluminal coronary angioplasty of the left circumflex obuse marginal vessel   CORONARY ANGIOPLASTY WITH STENT PLACEMENT     CORONARY ARTERY BYPASS GRAFT     x4 with a left internal mammary artery anastomosis to the left anterior descending coronary artery    CORONARY STENT PLACEMENT     successful percutaneous transluminal coronary angioplasty and stenting of the left main coronary artery   HERNIA REPAIR     RIGHT/LEFT HEART CATH AND CORONARY/GRAFT ANGIOGRAPHY N/A 01/26/2022   Procedure: RIGHT/LEFT HEART CATH AND CORONARY/GRAFT ANGIOGRAPHY;  Surgeon: Troy Sine, MD;  Location: Home CV LAB;  Service: Cardiovascular;  Laterality: N/A;   SAPHENOUS VEIN GRAFT RESECTION     graft  to the first obtuse marginal, a saphenous vein graft to the first diagonal coronary artery  and a saphenous vein graft to the distal right coronary artery   TEE WITHOUT CARDIOVERSION N/A 01/28/2022   Procedure: TRANSESOPHAGEAL ECHOCARDIOGRAM (TEE);  Surgeon: Berniece Salines, DO;  Location: [facility];  Service: Cardiovascular;  Laterality: N/A;   US ECHOCARDIOGRAPHY  02-08-2007   Est. EF 40-45%    Family History  Problem Relation Age of Onset   Cerebrovascular Accident Mother    Colon cancer Neg Hx    Social History   Socioeconomic History   Marital status: Married    Spouse name: Doris   Number of children: 3   Years of education: Not on file   Highest education level: High school graduate  Occupational History   Occupation: Retired   Tobacco Use   Smoking status: Never   Smokeless tobacco: Never  Vaping Use   Vaping Use: Never used  Substance and Sexual Activity   Alcohol use: No  Drug use: No   Sexual activity: Not Currently  Other Topics Concern   Not on file  Social History Narrative   Daily caffeine use    Social Determinants of Health   Financial Resource Strain: Low Risk  (01/26/2022)   Overall Financial Resource Strain (CARDIA)    Difficulty of Paying Living Expenses: Not hard at all  Food Insecurity: No Food Insecurity (01/26/2022)   Hunger Vital Sign    Worried About Running Out of Food in the Last Year: Never true    Ran Out of Food in the Last Year: Never true  Transportation Needs: No Transportation Needs (01/26/2022)   PRAPARE - Hydrologist (Medical): No    Lack of Transportation (Non-Medical): No  Physical Activity: Not on file  Stress: Not on file  Social Connections: Not on file    Review of Systems  Constitutional:  Negative for chills, fatigue, fever and unexpected weight change.  HENT:  Negative for congestion, ear pain, sinus pain and sore throat.   Eyes:  Negative for visual disturbance.  Respiratory:  Negative  for cough and shortness of breath.   Cardiovascular:  Negative for chest pain and palpitations.  Gastrointestinal:  Negative for abdominal pain, blood in stool, constipation, diarrhea, nausea and vomiting.  Endocrine: Negative for polydipsia.  Genitourinary:  Negative for dysuria.  Musculoskeletal:  Negative for back pain.  Skin:  Negative for rash.  Neurological:  Negative for headaches.  Psychiatric/Behavioral: Negative.       Objective:  BP 110/62   Pulse 78   Temp (!) 97.5 F (36.4 C)   Resp 15   Ht '5\' 9"'$  (1.753 m)   Wt 131 lb 9.6 oz (59.7 kg)   SpO2 99%   BMI 19.43 kg/m      02/03/2022    8:01 AM 02/02/2022    1:02 PM 01/29/2022    4:40 AM  BP/Weight  Systolic BP 017 510 258  Diastolic BP 62 66 71  Wt. (Lbs) 131.6 132.4 135.14  BMI 19.43 kg/m2 19.55 kg/m2 19.96 kg/m2    Physical Exam Vitals reviewed.  Constitutional:      General: He is not in acute distress.    Appearance: Normal appearance.  HENT:     Head: Normocephalic and atraumatic.     Right Ear: Tympanic membrane normal.     Left Ear: Tympanic membrane normal.     Nose: Nose normal.     Mouth/Throat:     Mouth: Mucous membranes are moist.     Pharynx: Oropharynx is clear.  Eyes:     Extraocular Movements: Extraocular movements intact.     Conjunctiva/sclera: Conjunctivae normal.     Pupils: Pupils are equal, round, and reactive to light.  Cardiovascular:     Rate and Rhythm: Normal rate and regular rhythm.     Pulses: Normal pulses.     Heart sounds: Normal heart sounds. No murmur heard.    No gallop.  Pulmonary:     Effort: Pulmonary effort is normal. No respiratory distress.     Breath sounds: Normal breath sounds. No wheezing.  Abdominal:     General: Abdomen is flat. Bowel sounds are normal. There is no distension.     Palpations: Abdomen is soft.     Tenderness: There is no abdominal tenderness.  Musculoskeletal:        General: Normal range of motion.     Cervical back: Normal range  of motion and neck supple.  Right lower leg: No edema.     Left lower leg: No edema.  Skin:    General: Skin is warm.     Capillary Refill: Capillary refill takes less than 2 seconds.  Neurological:     General: No focal deficit present.     Mental Status: He is alert and oriented to person, place, and time. Mental status is at baseline.     Motor: Weakness present.     Gait: Gait abnormal.  Psychiatric:        Mood and Affect: Mood normal.        Thought Content: Thought content normal.   \  Diabetic Foot Exam - Simple   Simple Foot Form Diabetic Foot exam was performed with the following findings: Yes 02/03/2022  9:16 AM  Visual Inspection See comments: Yes Sensation Testing See comments: Yes Pulse Check Posterior Tibialis and Dorsalis pulse intact bilaterally: Yes Comments Bunions and hammer toes with pre-ulcer callus No sensation feet      Lab Results  Component Value Date   WBC 6.1 01/28/2022   HGB 12.5 (L) 01/28/2022   HCT 36.0 (L) 01/28/2022   PLT 128 (L) 01/28/2022   GLUCOSE 121 (H) 02/02/2022   CHOL 157 09/17/2021   TRIG 71 09/17/2021   HDL 66 09/17/2021   LDLCALC 77 09/17/2021   ALT 12 09/17/2021   AST 20 09/17/2021   NA 140 02/02/2022   K 4.0 02/02/2022   CL 99 02/02/2022   CREATININE 0.92 02/02/2022   BUN 16 02/02/2022   CO2 21 02/02/2022   TSH 2.684 01/24/2022   INR 1.3 (H) 03/24/2011   HGBA1C 6.1 (H) 09/17/2021   MICROALBUR 10 05/13/2021      Assessment & Plan:   Problem List Items Addressed This Visit       Cardiovascular and Mediastinum   Coronary artery disease of native artery of native heart with stable angina pectoris [facility]) - Primary   Relevant Orders   Ambulatory referral to [place] An individual plan was formulated based on patient history and exam, labs and evidence based data. Patient has not had recent angina or nitroglycerin use. continue present treatment.     Benign essential HTN   Relevant Orders    Comprehensive metabolic panel   CBC with Differential/Platelet An individual hypertension care plan was established and reinforced today.  The patient's status was assessed using clinical findings on exam and labs or diagnostic tests. The patient's success at meeting treatment goals on disease specific evidence-based guidelines and found to be well controlled. SELF MANAGEMENT: The patient and I together assessed ways to personally work towards obtaining the recommended goals. RECOMMENDATIONS: avoid decongestants found in common cold remedies, decrease consumption of alcohol, perform routine monitoring of BP with home BP cuff, exercise, reduction of dietary salt, take medicines as prescribed, try not to miss doses and quit smoking.  Regular exercise and maintaining a healthy weight is needed.  Stress reduction may help. A CLINICAL SUMMARY including written plan identify barriers to care unique to individual due to social or financial issues.  We attempt to mutually creat solutions for individual and family understanding.    Type 2 diabetes mellitus with diabetic peripheral angiopathy without gangrene ([place])   Relevant Orders   Hemoglobin A1c   Ambulatory referral to Home Health An individual care plan for diabetes was established and reinforced today.  The patient's status was assessed using clinical findings on exam, labs and diagnostic testing. Patient success at meeting goals based  on disease specific evidence-based guidelines and found to be good controlled. Medications were assessed and patient's understanding of the medical issues , including barriers were assessed. Recommend adherence to a diabetic diet, a graduated exercise program, HgbA1c level is checked quarterly, and urine microalbumin performed yearly .  Annual mono-filament sensation testing performed. Lower blood pressure and control hyperlipidemia is important. Get annual eye exams and annual flu shots and smoking cessation discussed.  Self  management goals were discussed.      Respiratory   Chronic obstructive pulmonary disease ([place]) An individualize plan was formulated for care of COPD.  Treatment is evidence based.  She will continue on inhalers, avoid smoking and smoke.  Regular exercise with help with dyspnea. Routine follow ups and medication compliance is needed.      Endocrine   Diabetic polyneuropathy [facility])   Relevant Orders   Ambulatory referral to Home Health An individual care plan for diabetes was established and reinforced today.  The patient's status was assessed using clinical findings on exam, labs and diagnostic testing. Patient success at meeting goals based on disease specific evidence-based guidelines and found to be good controlled. Medications were assessed and patient's understanding of the medical issues , including barriers were assessed. Recommend adherence to a diabetic diet, a graduated exercise program, HgbA1c level is checked quarterly, and urine microalbumin performed yearly .  Annual mono-filament sensation testing performed. Lower blood pressure and control hyperlipidemia is important. Get annual eye exams and annual flu shots and smoking cessation discussed.  Self management goals were discussed.      Genitourinary   Benign prostatic hyperplasia with lower urinary tract symptoms   Relevant Orders   PSA AN INDIVIDUAL CARE PLAN for BPH was established and reinforced today.  The patient's status was assessed using clinical findings on exam, labs, and other diagnostic testing. Patient's success at meeting treatment goals based on disease specific evidence-bassed guidelines and found to be in goo control. RECOMMENDATIONS include maintaining present medicines and treatment.      Hematopoietic and Hemostatic   Idiopathic thrombocytopenic purpura ([place]) Patient has easy bruising     Other   Mixed hyperlipidemia   Relevant Orders   Lipid panel AN INDIVIDUAL CARE PLAN for hyperlipidemia/ cholesterol  was established and reinforced today.  The patient's status was assessed using clinical findings on exam, lab and other diagnostic tests. The patient's disease status was assessed based on evidence-based guidelines and found to be good controlled. MEDICATIONS were reviewed. SELF MANAGEMENT GOALS have been discussed and patient's success at attaining the goal of low cholesterol was assessed. RECOMMENDATION given include regular exercise 3 days a week and low cholesterol/low fat diet. CLINICAL SUMMARY including written plan to identify barriers unique to the patient due to social or economic  reasons was discussed.    Other Visit Diagnoses     HFrEF (heart failure with reduced ejection fraction) ([place])       Relevant Orders   Ambulatory referral to Home Health Stable since last admission and atrial fibrillation converion     .    Orders Placed This Encounter  Procedures   Comprehensive metabolic panel   Hemoglobin A1c   Lipid panel   CBC with Differential/Platelet   PSA   Ambulatory referral to Home Health     Follow-up: Return in about 3 months (around 05/06/2022).  An After Visit Summary was printed and given to the patient.  Reinaldo Meeker, MD [facility] [phone]

## 2022-02-03 ENCOUNTER — Ambulatory Visit (INDEPENDENT_AMBULATORY_CARE_PROVIDER_SITE_OTHER): Payer: Medicare HMO | Admitting: Legal Medicine

## 2022-02-03 ENCOUNTER — Encounter: Payer: Self-pay | Admitting: Legal Medicine

## 2022-02-03 VITALS — BP 110/62 | HR 78 | Temp 97.5°F | Resp 15 | Ht 69.0 in | Wt 131.6 lb

## 2022-02-03 DIAGNOSIS — N401 Enlarged prostate with lower urinary tract symptoms: Secondary | ICD-10-CM | POA: Diagnosis not present

## 2022-02-03 DIAGNOSIS — I25118 Atherosclerotic heart disease of native coronary artery with other forms of angina pectoris: Secondary | ICD-10-CM

## 2022-02-03 DIAGNOSIS — I10 Essential (primary) hypertension: Secondary | ICD-10-CM

## 2022-02-03 DIAGNOSIS — E782 Mixed hyperlipidemia: Secondary | ICD-10-CM

## 2022-02-03 DIAGNOSIS — D693 Immune thrombocytopenic purpura: Secondary | ICD-10-CM | POA: Diagnosis not present

## 2022-02-03 DIAGNOSIS — E1151 Type 2 diabetes mellitus with diabetic peripheral angiopathy without gangrene: Secondary | ICD-10-CM | POA: Diagnosis not present

## 2022-02-03 DIAGNOSIS — E1142 Type 2 diabetes mellitus with diabetic polyneuropathy: Secondary | ICD-10-CM

## 2022-02-03 DIAGNOSIS — I5020 Unspecified systolic (congestive) heart failure: Secondary | ICD-10-CM | POA: Diagnosis not present

## 2022-02-03 DIAGNOSIS — J410 Simple chronic bronchitis: Secondary | ICD-10-CM

## 2022-02-03 LAB — BASIC METABOLIC PANEL WITH GFR
BUN/Creatinine Ratio: 17 (ref 10–24)
BUN: 16 mg/dL (ref 8–27)
CO2: 21 mmol/L (ref 20–29)
Calcium: 9.9 mg/dL (ref 8.6–10.2)
Chloride: 99 mmol/L (ref 96–106)
Creatinine, Ser: 0.92 mg/dL (ref 0.76–1.27)
Glucose: 121 mg/dL — ABNORMAL HIGH (ref 70–99)
Potassium: 4 mmol/L (ref 3.5–5.2)
Sodium: 140 mmol/L (ref 134–144)
eGFR: 83 mL/min/1.73

## 2022-02-04 LAB — LIPID PANEL
Chol/HDL Ratio: 2.7 ratio (ref 0.0–5.0)
Cholesterol, Total: 166 mg/dL (ref 100–199)
HDL: 61 mg/dL (ref >39)
LDL Chol Calc (NIH): 85 mg/dL (ref 0–99)
Triglycerides: 109 mg/dL (ref 0–149)
VLDL Cholesterol Cal: 20 mg/dL (ref 5–40)

## 2022-02-04 LAB — COMPREHENSIVE METABOLIC PANEL
ALT: 20 IU/L (ref 0–44)
AST: 17 IU/L (ref 0–40)
Albumin/Globulin Ratio: 1.4 (ref 1.2–2.2)
Albumin: 4 g/dL (ref 3.7–4.7)
Alkaline Phosphatase: 64 IU/L (ref 44–121)
BUN/Creatinine Ratio: 19 (ref 10–24)
BUN: 16 mg/dL (ref 8–27)
Bilirubin Total: 1.6 mg/dL — ABNORMAL HIGH (ref 0.0–1.2)
CO2: 24 mmol/L (ref 20–29)
Calcium: 10.1 mg/dL (ref 8.6–10.2)
Chloride: 98 mmol/L (ref 96–106)
Creatinine, Ser: 0.84 mg/dL (ref 0.76–1.27)
Globulin, Total: 2.8 g/dL (ref 1.5–4.5)
Glucose: 135 mg/dL — ABNORMAL HIGH (ref 70–99)
Potassium: 4.4 mmol/L (ref 3.5–5.2)
Sodium: 136 mmol/L (ref 134–144)
Total Protein: 6.8 g/dL (ref 6.0–8.5)
eGFR: 87 mL/min/{1.73_m2} (ref >59)

## 2022-02-04 LAB — CBC WITH DIFFERENTIAL/PLATELET
Basophils Absolute: 0 10*3/uL (ref 0.0–0.2)
Basos: 1 %
EOS (ABSOLUTE): 0.1 10*3/uL (ref 0.0–0.4)
Eos: 2 %
Hematocrit: 38.4 % (ref 37.5–51.0)
Hemoglobin: 12.9 g/dL — ABNORMAL LOW (ref 13.0–17.7)
Immature Grans (Abs): 0.1 10*3/uL (ref 0.0–0.1)
Immature Granulocytes: 1 %
Lymphocytes Absolute: 1.2 10*3/uL (ref 0.7–3.1)
Lymphs: 21 %
MCH: 31.5 pg (ref 26.6–33.0)
MCHC: 33.6 g/dL (ref 31.5–35.7)
MCV: 94 fL (ref 79–97)
Monocytes Absolute: 0.6 10*3/uL (ref 0.1–0.9)
Monocytes: 10 %
Neutrophils Absolute: 3.8 10*3/uL (ref 1.4–7.0)
Neutrophils: 65 %
Platelets: 161 10*3/uL (ref 150–450)
RBC: 4.09 x10E6/uL — ABNORMAL LOW (ref 4.14–5.80)
RDW: 13.1 % (ref 11.6–15.4)
WBC: 5.9 10*3/uL (ref 3.4–10.8)

## 2022-02-04 LAB — PSA: Prostate Specific Ag, Serum: 4.3 ng/mL — ABNORMAL HIGH (ref 0.0–4.0)

## 2022-02-04 LAB — CARDIOVASCULAR RISK ASSESSMENT

## 2022-02-04 LAB — HEMOGLOBIN A1C
Est. average glucose Bld gHb Est-mCnc: 137 mg/dL
Hgb A1c MFr Bld: 6.4 % — ABNORMAL HIGH (ref 4.8–5.6)

## 2022-02-04 NOTE — Progress Notes (Signed)
Glucose 135, kidney tests normal, liver tests normal, A1c 6.4, mild anemia, PSA 4.3 high does he see urology?, cholesterol normal lp

## 2022-02-05 ENCOUNTER — Ambulatory Visit: Payer: Medicare HMO | Admitting: Podiatry

## 2022-02-05 ENCOUNTER — Other Ambulatory Visit: Payer: Self-pay | Admitting: Legal Medicine

## 2022-02-05 ENCOUNTER — Encounter: Payer: Self-pay | Admitting: Podiatry

## 2022-02-05 DIAGNOSIS — L84 Corns and callosities: Secondary | ICD-10-CM | POA: Diagnosis not present

## 2022-02-05 DIAGNOSIS — M79609 Pain in unspecified limb: Secondary | ICD-10-CM | POA: Diagnosis not present

## 2022-02-05 DIAGNOSIS — B351 Tinea unguium: Secondary | ICD-10-CM

## 2022-02-05 DIAGNOSIS — R972 Elevated prostate specific antigen [PSA]: Secondary | ICD-10-CM

## 2022-02-05 DIAGNOSIS — E1142 Type 2 diabetes mellitus with diabetic polyneuropathy: Secondary | ICD-10-CM

## 2022-02-09 NOTE — Progress Notes (Signed)
  Subjective:  Patient ID: [name], male    DOB: 1940-09-24,  MRN: [id number]  [name] presents to clinic today for at risk foot care with history of diabetic neuropathy and callus(es) b/l lower extremities and painful thick toenails that are difficult to trim. Painful toenails interfere with ambulation. Aggravating factors include wearing enclosed shoe gear. Pain is relieved with periodic professional debridement. Painful calluses are aggravated when weightbearing with and without shoegear. Pain is relieved with periodic professional debridement.  Patient states blood glucose was 119 mg/dl today.  Last A1c was 6.1%.  New problem(s): None.   PCP is Lillard Anes, MD , and last visit was  February 03, 2022  Allergies  Allergen Reactions   Lipitor [Atorvastatin Calcium] Other (See Comments)    Causes memory loss   Crestor [Rosuvastatin Calcium] Other (See Comments)    Causes Muscle Pain   Zocor [Simvastatin] Other (See Comments)    Causes muscle pain    Review of Systems: Negative except as noted in the HPI.  Objective: No changes noted in today's physical examination.  Vascular Examination: CFT <3 seconds b/l. DP pulses faintly palpable b/l. PT pulses diminished b/l. Digital hair absent. Skin temperature gradient warm to warm b/l. No ischemia or gangrene. No cyanosis or clubbing noted b/l. Varicosities present b/l.   Neurological Examination: Sensation grossly intact b/l with 10 gram monofilament.Vibratory sensation diminished b/l.  Dermatological Examination: Pedal skin warm and supple b/l. Toenails 1-5 b/l thick, discolored, elongated with subungual debris and pain on dorsal palpation.  Hyperkeratotic lesion(s) submet head 3 b/l.  No erythema, no edema, no drainage, no fluctuance.  Musculoskeletal Examination: Muscle strength 5/5 to b/l LE. HAV with bunion bilaterally and hammertoes 2-5 b/l.  Radiographs: None  Last A1c:      Latest Ref Rng & Units  02/03/2022    8:57 AM 09/17/2021    8:36 AM 05/13/2021    9:13 AM  Hemoglobin A1C  Hemoglobin-A1c 4.8 - 5.6 % 6.4  6.1  6.0    Assessment/Plan: 1. Pain due to onychomycosis of nail   2. Callus   3. Diabetic polyneuropathy associated with type 2 diabetes mellitus ([place])   -Patient was evaluated and treated. All patient's and/or POA's questions/concerns answered on today's visit. -Continue foot and shoe inspections daily. Monitor blood glucose per PCP/Endocrinologist's recommendations. -Mycotic toenails 1-5 bilaterally were debrided in length and girth with sterile nail nippers and dremel without incident. -Callus(es) submet head 3 b/l pared utilizing sterile scalpel blade without complication or incident. Total number debrided =2. -Patient/POA to call should there be question/concern in the interim.   Return in about 3 months (around 05/08/2022).  Marzetta Board, DPM

## 2022-02-09 NOTE — Progress Notes (Incomplete)
HEART & VASCULAR TRANSITION OF CARE CONSULT NOTE     Referring Physician: Dr. Evette Doffing Primary Care: Dr. Henrene Pastor Primary Cardiologist: Dr. Acie Fredrickson  HPI: Referred to clinic by Dr. Evette Doffing with family medicine for heart failure consultation. 81 y.o. male with history of CAD s/p CABG in 2003 with subsequent PCI in 2007, DM II, chronic diastolic CHF. He has been followed by Dr. Acie Fredrickson in the Cardiology clinic.  Echo 06/22: EF 50-55%, RV okay, RVSP normal, mild to moderate MR  He was admitted on 01/24/22 with progressive weakness and dyspnea. Echo with EF < 20%, RV mildly reduced, RVSP 80 mmHg, moderate to severe MR with concern for torn chordae, mild to moderate TR.    R/LHC 01/26/22: LIMA to LAD patent, patent SVG to distal RCA, occluded SVG to diagonal, SVG to Cx is occluded, RA mean 6, PA 41/23 (30), PCWP mean 21 (v 28), PA sat 58%, Fick CO/CI 4.5/2.6, Thermo CO/CI 3.4/1.9.   No targets for intervention on LHC.  TEE07/19/23: EF < 20%, moderate to severe functional MR, highly mobile echogenic mass appears to be papillary muscle, RV moderately reduced  He diuresed with IV lasix. Difficulty tolerating GDMT d/t low blood pressures.   He was seen by Dr. Burt Knack for consideration of mTEER. Echo from 2022 was reviewed, EF appeared severely depressed on that study. LV function and mitral regurgitation subsequently worsened. Titration of GDMT recommended with repeat echo in 2-3 months once on optimal medical therapy.   Seen by Dr. Acie Fredrickson for follow-up 02/02/22. Weight had been declining. Lasix decreased to PRN and started on spiro 25 mg daily. Advised to f/u with HF clinic, to follow with Cardiology PRN.  He is here today for hospital follow-up.   Review of Systems: [y] = yes, '[ ]'$  = no   General: Weight gain '[ ]'$ ; Weight loss '[ ]'$ ; Anorexia '[ ]'$ ; Fatigue '[ ]'$ ; Fever '[ ]'$ ; Chills '[ ]'$ ; Weakness '[ ]'$   Cardiac: Chest pain/pressure '[ ]'$ ; Resting SOB '[ ]'$ ; Exertional SOB '[ ]'$ ; Orthopnea '[ ]'$ ; Pedal Edema [  ]; Palpitations '[ ]'$ ; Syncope '[ ]'$ ; Presyncope '[ ]'$ ; Paroxysmal nocturnal dyspnea'[ ]'$   Pulmonary: Cough '[ ]'$ ; Wheezing'[ ]'$ ; Hemoptysis'[ ]'$ ; Sputum '[ ]'$ ; Snoring '[ ]'$   GI: Vomiting'[ ]'$ ; Dysphagia'[ ]'$ ; Melena'[ ]'$ ; Hematochezia '[ ]'$ ; Heartburn'[ ]'$ ; Abdominal pain '[ ]'$ ; Constipation '[ ]'$ ; Diarrhea '[ ]'$ ; BRBPR '[ ]'$   GU: Hematuria'[ ]'$ ; Dysuria '[ ]'$ ; Nocturia'[ ]'$   Vascular: Pain in legs with walking '[ ]'$ ; Pain in feet with lying flat '[ ]'$ ; Non-healing sores '[ ]'$ ; Stroke '[ ]'$ ; TIA '[ ]'$ ; Slurred speech '[ ]'$ ;  Neuro: Headaches'[ ]'$ ; Vertigo'[ ]'$ ; Seizures'[ ]'$ ; Paresthesias'[ ]'$ ;Blurred vision '[ ]'$ ; Diplopia '[ ]'$ ; Vision changes '[ ]'$   Ortho/Skin: Arthritis '[ ]'$ ; Joint pain '[ ]'$ ; Muscle pain '[ ]'$ ; Joint swelling '[ ]'$ ; Back Pain '[ ]'$ ; Rash '[ ]'$   Psych: Depression'[ ]'$ ; Anxiety'[ ]'$   Heme: Bleeding problems '[ ]'$ ; Clotting disorders '[ ]'$ ; Anemia '[ ]'$   Endocrine: Diabetes '[ ]'$ ; Thyroid dysfunction'[ ]'$    Past Medical History:  Diagnosis Date   Acute exacerbation of CHF (congestive heart failure) (HCC) 09/29/2011   Atherosclerotic heart disease of native coronary artery without angina pectoris 02/12/2010   Qualifier: Diagnosis of  By: Marland Mcalpine     Benign prostatic hyperplasia with lower urinary tract symptoms 12/04/2019   CAD (coronary artery disease)    Hx of    Cellulitis 03/19/2012   Chronic obstructive pulmonary disease ([place]) 12/04/2019  Diverticulosis of colon (without mention of hemorrhage) 2011   Colonoscopy    Hiatal hernia 2011   EGD   Hypertension    Hypertensive heart disease without heart failure 12/04/2019   ITP (idiopathic thrombocytopenic purpura)    He has been diagnosed with   Pancytopenia    Thrombocytopenia, unspecified ([place]) 01/24/2013   TRANSAMINASES, SERUM, ELEVATED 02/12/2010   Qualifier: Diagnosis of  By: Marland Mcalpine     Type 2 diabetes mellitus with diabetic peripheral angiopathy without gangrene ([place]) 12/04/2019    Current Outpatient Medications  Medication Sig Dispense Refill   cholecalciferol  (VITAMIN D3) 25 MCG (1000 UT) tablet Take 5,000 Units by mouth daily.     clopidogrel (PLAVIX) 75 MG tablet Take 1 tablet (75 mg total) by mouth daily. 90 tablet 3   empagliflozin (JARDIANCE) 10 MG TABS tablet Take 1 tablet (10 mg total) by mouth daily. 30 tablet 3   ezetimibe (ZETIA) 10 MG tablet TAKE 1 TABLET BY MOUTH DAILY 90 tablet 0   Lancets MISC 1 each by Does not apply route daily as needed. Please provide lancets that fit patients lancet device 100 each 4   metFORMIN (GLUCOPHAGE) 500 MG tablet Take 500 mg by mouth in the morning, at noon, in the evening, and at bedtime.     metoprolol succinate (TOPROL-XL) 25 MG 24 hr tablet Take 0.5 tablets (12.5 mg total) by mouth daily. 30 tablet 2   Multiple Vitamin (MULTIVITAMIN) tablet Take 1 tablet by mouth daily.     nitroGLYCERIN (NITROSTAT) 0.4 MG SL tablet Place 1 tablet (0.4 mg total) under the tongue every 5 (five) minutes as needed for chest pain. 90 tablet 3   ONETOUCH ULTRA test strip AS DIRECTED 100 each 4   spironolactone (ALDACTONE) 25 MG tablet Take 1 tablet (25 mg total) by mouth daily. 90 tablet 3   No current facility-administered medications for this visit.    Allergies  Allergen Reactions   Lipitor [Atorvastatin Calcium] Other (See Comments)    Causes memory loss   Crestor [Rosuvastatin Calcium] Other (See Comments)    Causes Muscle Pain   Zocor [Simvastatin] Other (See Comments)    Causes muscle pain      Social History   Socioeconomic History   Marital status: Married    Spouse name: Doris   Number of children: 3   Years of education: Not on file   Highest education level: High school graduate  Occupational History   Occupation: Retired   Tobacco Use   Smoking status: Never   Smokeless tobacco: Never  Vaping Use   Vaping Use: Never used  Substance and Sexual Activity   Alcohol use: No   Drug use: No   Sexual activity: Not Currently  Other Topics Concern   Not on file  Social History Narrative   Daily  caffeine use    Social Determinants of Health   Financial Resource Strain: Low Risk  (01/26/2022)   Overall Financial Resource Strain (CARDIA)    Difficulty of Paying Living Expenses: Not hard at all  Food Insecurity: No Food Insecurity (01/26/2022)   Hunger Vital Sign    Worried About Running Out of Food in the Last Year: Never true    [place] in the Last Year: Never true  Transportation Needs: No Transportation Needs (01/26/2022)   PRAPARE - Hydrologist (Medical): No    Lack of Transportation (Non-Medical): No  Physical Activity: Not on file  Stress: Not on file  Social Connections: Not on file  Intimate Partner Violence: Not on file      Family History  Problem Relation Age of Onset   Cerebrovascular Accident Mother    Colon cancer Neg Hx     There were no vitals filed for this visit.  PHYSICAL EXAM: General:  Well appearing. No respiratory difficulty HEENT: normal Neck: supple. no JVD. Carotids 2+ bilat; no bruits. No lymphadenopathy or thryomegaly appreciated. Cor: PMI nondisplaced. Regular rate & rhythm. No rubs, gallops or murmurs. Lungs: clear Abdomen: soft, nontender, nondistended. No hepatosplenomegaly. No bruits or masses. Good bowel sounds. Extremities: no cyanosis, clubbing, rash, edema Neuro: alert & oriented x 3, cranial nerves grossly intact. moves all 4 extremities w/o difficulty. Affect pleasant.  ECG:   ASSESSMENT & PLAN:  HFrEF -Echo 07/23: EF < 20%  Moderate to severe MR  CAD  DM II     NYHA *** GDMT  Diuretic- BB- Ace/ARB/ARNI MRA SGLT2i    Referred to HFSW (PCP, Medications, Transportation, ETOH Abuse, Drug Abuse, Insurance, Financial ): Yes or No Refer to Pharmacy: Yes or No Refer to Home Health: Yes on No Refer to Advanced Heart Failure Clinic: Yes or no  Refer to General Cardiology: Yes or No  Follow up

## 2022-02-10 ENCOUNTER — Encounter (HOSPITAL_COMMUNITY): Payer: Self-pay

## 2022-02-10 ENCOUNTER — Ambulatory Visit (HOSPITAL_COMMUNITY)
Admission: RE | Admit: 2022-02-10 | Discharge: 2022-02-10 | Disposition: A | Discharge status: Home / Self Care | Payer: Medicare HMO | Source: Ambulatory Visit | Attending: Physician Assistant | Admitting: Physician Assistant

## 2022-02-10 VITALS — BP 120/82 | HR 80 | Wt 129.6 lb

## 2022-02-10 DIAGNOSIS — Z7902 Long term (current) use of antithrombotics/antiplatelets: Secondary | ICD-10-CM | POA: Insufficient documentation

## 2022-02-10 DIAGNOSIS — Z951 Presence of aortocoronary bypass graft: Secondary | ICD-10-CM | POA: Insufficient documentation

## 2022-02-10 DIAGNOSIS — I2510 Atherosclerotic heart disease of native coronary artery without angina pectoris: Secondary | ICD-10-CM

## 2022-02-10 DIAGNOSIS — I110 Hypertensive heart disease with heart failure: Secondary | ICD-10-CM | POA: Insufficient documentation

## 2022-02-10 DIAGNOSIS — E119 Type 2 diabetes mellitus without complications: Secondary | ICD-10-CM | POA: Insufficient documentation

## 2022-02-10 DIAGNOSIS — I5020 Unspecified systolic (congestive) heart failure: Secondary | ICD-10-CM

## 2022-02-10 DIAGNOSIS — Z79899 Other long term (current) drug therapy: Secondary | ICD-10-CM | POA: Diagnosis not present

## 2022-02-10 DIAGNOSIS — Z7984 Long term (current) use of oral hypoglycemic drugs: Secondary | ICD-10-CM | POA: Insufficient documentation

## 2022-02-10 DIAGNOSIS — E1151 Type 2 diabetes mellitus with diabetic peripheral angiopathy without gangrene: Secondary | ICD-10-CM | POA: Diagnosis not present

## 2022-02-10 DIAGNOSIS — I2584 Coronary atherosclerosis due to calcified coronary lesion: Secondary | ICD-10-CM | POA: Diagnosis not present

## 2022-02-10 DIAGNOSIS — I340 Nonrheumatic mitral (valve) insufficiency: Secondary | ICD-10-CM

## 2022-02-10 LAB — BASIC METABOLIC PANEL
Anion gap: 7 (ref 5–15)
BUN: 17 mg/dL (ref 8–23)
CO2: 24 mmol/L (ref 22–32)
Calcium: 10.1 mg/dL (ref 8.9–10.3)
Chloride: 104 mmol/L (ref 98–111)
Creatinine, Ser: 0.94 mg/dL (ref 0.61–1.24)
GFR, Estimated: >60 mL/min (ref >60)
Glucose, Bld: 129 mg/dL — ABNORMAL HIGH (ref 70–99)
Potassium: 4.6 mmol/L (ref 3.5–5.1)
Sodium: 135 mmol/L (ref 135–145)

## 2022-02-10 LAB — BRAIN NATRIURETIC PEPTIDE: B Natriuretic Peptide: 884.3 pg/mL — ABNORMAL HIGH (ref 0.0–100.0)

## 2022-02-10 MED ORDER — FUROSEMIDE 20 MG PO TABS: 20.0000 mg | ORAL_TABLET | ORAL | 3 refills | Status: DC | PRN

## 2022-02-10 MED ORDER — LOSARTAN POTASSIUM 25 MG PO TABS: 12.5000 mg | ORAL_TABLET | Freq: Every day | ORAL | 3 refills | Status: DC

## 2022-02-10 NOTE — Progress Notes (Signed)
ReDS Vest / Clip - 02/10/22 1000       ReDS Vest / Clip   Station Marker C    Ruler Value 24    ReDS Value Range Low volume    ReDS Actual Value 29

## 2022-02-10 NOTE — Patient Instructions (Signed)
Start Losartan 12.'5mg'$  (1/2 Tab) daily.  Take Lasix as needed for weight gain of 3lb in 24 hours or 5 lb in a week ot swelling   Labs done today, your results will be available in MyChart, we will contact you for abnormal readings.  Repeat blood work in 7 days.  Your physician recommends that you schedule a follow-up appointment as scheduled.  If you have any questions or concerns before your next appointment please send [place] a message through [place] or call our office at [phone].    TO LEAVE A MESSAGE FOR THE NURSE SELECT OPTION 2, PLEASE LEAVE A MESSAGE INCLUDING: YOUR NAME DATE OF BIRTH CALL BACK NUMBER REASON FOR CALL**this is important as we prioritize the call backs  YOU WILL RECEIVE A CALL BACK THE SAME DAY AS LONG AS YOU CALL BEFORE 4:00 PM  At the [facility], you and your health needs are our priority. As part of our continuing mission to provide you with exceptional heart care, we have created designated Provider Care Teams. These Care Teams include your primary Cardiologist (physician) and Advanced Practice Providers (APPs- Physician Assistants and Nurse Practitioners) who all work together to provide you with the care you need, when you need it.   You may see any of the following providers on your designated Care Team at your next follow up: Dr Glori Bickers Dr Haynes Kerns, NP Lyda Jester, [place] [facility] [place], [place] Audry Riles, PharmD   Please be sure to bring in all your medications bottles to every appointment.

## 2022-02-12 ENCOUNTER — Telehealth: Payer: Self-pay

## 2022-02-12 ENCOUNTER — Encounter (HOSPITAL_COMMUNITY): Payer: Self-pay | Admitting: Cardiology

## 2022-02-12 NOTE — Telephone Encounter (Signed)
Nurse Meda Coffee)  from [place] calling to report patient BP was 98/54 and heart rate was 60 patient is asymptomatic. Meda Coffee can be reached at [phone].

## 2022-02-13 NOTE — Telephone Encounter (Signed)
Called Nurse (nelson) and let him know and he will follow up with patient.

## 2022-02-19 ENCOUNTER — Ambulatory Visit (HOSPITAL_COMMUNITY)
Admission: RE | Admit: 2022-02-19 | Discharge: 2022-02-19 | Disposition: A | Discharge status: Home / Self Care | Payer: Medicare HMO | Source: Ambulatory Visit | Attending: Internal Medicine | Admitting: Internal Medicine

## 2022-02-19 VITALS — BP 108/59

## 2022-02-19 DIAGNOSIS — I5032 Chronic diastolic (congestive) heart failure: Secondary | ICD-10-CM | POA: Diagnosis not present

## 2022-02-19 LAB — BASIC METABOLIC PANEL
Anion gap: 9 (ref 5–15)
BUN: 22 mg/dL (ref 8–23)
CO2: 23 mmol/L (ref 22–32)
Calcium: 9.9 mg/dL (ref 8.9–10.3)
Chloride: 103 mmol/L (ref 98–111)
Creatinine, Ser: 1.09 mg/dL (ref 0.61–1.24)
GFR, Estimated: >60 mL/min (ref >60)
Glucose, Bld: 170 mg/dL — ABNORMAL HIGH (ref 70–99)
Potassium: 4.3 mmol/L (ref 3.5–5.1)
Sodium: 135 mmol/L (ref 135–145)

## 2022-02-19 MED ORDER — SPIRONOLACTONE 25 MG PO TABS: 12.5000 mg | ORAL_TABLET | Freq: Every day | ORAL | 3 refills | Status: DC

## 2022-02-19 NOTE — Telephone Encounter (Signed)
Rechecked bp this am when he came for labs. It was 108/59 MAP 68.  Taking meds as we have instructed.  Please advise

## 2022-02-19 NOTE — Telephone Encounter (Signed)
BP stable. He should have stopped losartan on 08/03. Arlyce Harman to be reduced to 12.5 mg daily as of 08/07. Please confirm med changes were made by patient, med list in chart not reflect this.

## 2022-02-19 NOTE — Progress Notes (Signed)
Pt came in for morning labs. Son wanted to just wanted to clarify med list. Went through med list went him and patient and spoke about how he was feeling when bp was low.  Will pass this onto a provider to get input

## 2022-02-24 NOTE — Telephone Encounter (Signed)
Emer Colleran,RN aware.

## 2022-02-25 ENCOUNTER — Telehealth (HOSPITAL_COMMUNITY): Payer: Self-pay

## 2022-02-25 NOTE — Telephone Encounter (Signed)
Called Dr per request. Left message for her to call the office on [phone] option 2

## 2022-02-26 NOTE — Addendum Note (Signed)
Addended by: Jerl Mina on: 02/26/2022 11:47 AM   Modules accepted: Orders

## 2022-03-02 ENCOUNTER — Other Ambulatory Visit: Payer: Self-pay | Admitting: Cardiovascular Disease

## 2022-03-03 ENCOUNTER — Ambulatory Visit (INDEPENDENT_AMBULATORY_CARE_PROVIDER_SITE_OTHER): Payer: Medicare HMO | Admitting: Legal Medicine

## 2022-03-03 ENCOUNTER — Encounter: Payer: Self-pay | Admitting: Legal Medicine

## 2022-03-03 VITALS — BP 90/58 | HR 70 | Temp 97.9°F | Resp 15 | Ht 69.0 in | Wt 126.0 lb

## 2022-03-03 DIAGNOSIS — Z681 Body mass index (BMI) 19 or less, adult: Secondary | ICD-10-CM | POA: Diagnosis not present

## 2022-03-03 DIAGNOSIS — K449 Diaphragmatic hernia without obstruction or gangrene: Secondary | ICD-10-CM | POA: Diagnosis not present

## 2022-03-03 DIAGNOSIS — I480 Paroxysmal atrial fibrillation: Secondary | ICD-10-CM | POA: Diagnosis not present

## 2022-03-03 DIAGNOSIS — K5730 Diverticulosis of large intestine without perforation or abscess without bleeding: Secondary | ICD-10-CM | POA: Diagnosis not present

## 2022-03-03 DIAGNOSIS — Z7902 Long term (current) use of antithrombotics/antiplatelets: Secondary | ICD-10-CM | POA: Diagnosis not present

## 2022-03-03 DIAGNOSIS — I5032 Chronic diastolic (congestive) heart failure: Secondary | ICD-10-CM | POA: Diagnosis not present

## 2022-03-03 DIAGNOSIS — E1142 Type 2 diabetes mellitus with diabetic polyneuropathy: Secondary | ICD-10-CM

## 2022-03-03 DIAGNOSIS — E559 Vitamin D deficiency, unspecified: Secondary | ICD-10-CM | POA: Diagnosis not present

## 2022-03-03 DIAGNOSIS — N401 Enlarged prostate with lower urinary tract symptoms: Secondary | ICD-10-CM | POA: Diagnosis not present

## 2022-03-03 DIAGNOSIS — I110 Hypertensive heart disease with heart failure: Secondary | ICD-10-CM | POA: Diagnosis not present

## 2022-03-03 DIAGNOSIS — E1151 Type 2 diabetes mellitus with diabetic peripheral angiopathy without gangrene: Secondary | ICD-10-CM

## 2022-03-03 DIAGNOSIS — I340 Nonrheumatic mitral (valve) insufficiency: Secondary | ICD-10-CM | POA: Diagnosis not present

## 2022-03-03 DIAGNOSIS — E785 Hyperlipidemia, unspecified: Secondary | ICD-10-CM | POA: Diagnosis not present

## 2022-03-03 DIAGNOSIS — I25118 Atherosclerotic heart disease of native coronary artery with other forms of angina pectoris: Secondary | ICD-10-CM | POA: Diagnosis not present

## 2022-03-03 DIAGNOSIS — Z9181 History of falling: Secondary | ICD-10-CM

## 2022-03-03 DIAGNOSIS — E440 Moderate protein-calorie malnutrition: Secondary | ICD-10-CM

## 2022-03-03 DIAGNOSIS — J449 Chronic obstructive pulmonary disease, unspecified: Secondary | ICD-10-CM

## 2022-03-03 DIAGNOSIS — I25708 Atherosclerosis of coronary artery bypass graft(s), unspecified, with other forms of angina pectoris: Secondary | ICD-10-CM | POA: Diagnosis not present

## 2022-03-03 MED ORDER — METFORMIN HCL 500 MG PO TABS
500.0000 mg | ORAL_TABLET | Freq: Two times a day (BID) | ORAL | 2 refills | Status: DC
Start: 2022-03-03 — End: 2022-03-30

## 2022-03-03 MED ORDER — BD PEN NEEDLE NANO U/F 32G X 4 MM MISC: 1.0000 | Freq: Every day | 3 refills | Status: DC

## 2022-03-03 MED ORDER — MEGESTROL ACETATE 20 MG PO TABS: 20.0000 mg | ORAL_TABLET | Freq: Every day | ORAL | 3 refills | Status: DC

## 2022-03-03 NOTE — Progress Notes (Signed)
Acute Office Visit  Subjective:    Patient ID: [name], male    DOB: Mar 29, 1941, 81 y.o.   MRN: [id number]  Chief Complaint  Patient presents with   Weight Loss   Fatigue    HPI: Patient is in today for fatigue, weak, tired and weight loss since he has discharged from the hospital on 01/29/2022. He was admitted for CHF and Afib.  He mentioned has SOB sometimes, and lack energy. His blood pressure at home has been in the range from 107/69 to 99/57 and his blood sugar has been in the range of 96-114 mg/dl.  Patient has severe CAD with now EF < 20%.  He gets home therapy. No chest pains.  Past Medical History:  Diagnosis Date   Acute exacerbation of CHF (congestive heart failure) ([place]) 09/29/2011   Atherosclerotic heart disease of native coronary artery without angina pectoris 02/12/2010   Qualifier: Diagnosis of  By: Marland Mcalpine     Benign prostatic hyperplasia with lower urinary tract symptoms 12/04/2019   CAD (coronary artery disease)    Hx of    Cellulitis 03/19/2012   Chronic obstructive pulmonary disease ([place]) 12/04/2019   Diverticulosis of colon (without mention of hemorrhage) 2011   Colonoscopy    Hiatal hernia 2011   EGD   Hypertension    Hypertensive heart disease without heart failure 12/04/2019   ITP (idiopathic thrombocytopenic purpura)    He has been diagnosed with   Pancytopenia    Thrombocytopenia, unspecified ([place]) 01/24/2013   TRANSAMINASES, SERUM, ELEVATED 02/12/2010   Qualifier: Diagnosis of  By: Marland Mcalpine     Type 2 diabetes mellitus with diabetic peripheral angiopathy without gangrene ([place]) 12/04/2019    Past Surgical History:  Procedure Laterality Date   CARDIAC CATHETERIZATION     His last heart catheterization in May of 2009 reveals a patent LIMA   CORONARY ANGIOPLASTY     Successful percutaneous transluminal coronary angioplasty of the left circumflex obuse marginal vessel   CORONARY ANGIOPLASTY WITH STENT PLACEMENT      CORONARY ARTERY BYPASS GRAFT     x4 with a left internal mammary artery anastomosis to the left anterior descending coronary artery    CORONARY STENT PLACEMENT     successful percutaneous transluminal coronary angioplasty and stenting of the left main coronary artery   HERNIA REPAIR     RIGHT/LEFT HEART CATH AND CORONARY/GRAFT ANGIOGRAPHY N/A 01/26/2022   Procedure: RIGHT/LEFT HEART CATH AND CORONARY/GRAFT ANGIOGRAPHY;  Surgeon: Troy Sine, MD;  Location: [facility];  Service: Cardiovascular;  Laterality: N/A;   SAPHENOUS VEIN GRAFT RESECTION     graft to the first obtuse marginal, a saphenous vein graft to the first diagonal coronary artery  and a saphenous vein graft to the distal right coronary artery   TEE WITHOUT CARDIOVERSION N/A 01/28/2022   Procedure: TRANSESOPHAGEAL ECHOCARDIOGRAM (TEE);  Surgeon: Berniece Salines, DO;  Location: [facility];  Service: Cardiovascular;  Laterality: N/A;   US ECHOCARDIOGRAPHY  02-08-2007   Est. EF 40-45%    Family History  Problem Relation Age of Onset   Cerebrovascular Accident Mother    Colon cancer Neg Hx     Social History   Socioeconomic History   Marital status: Married    Spouse name: Doris   Number of children: 3   Years of education: Not on file   Highest education level: High school graduate  Occupational History   Occupation: Retired   Tobacco Use  Smoking status: Never   Smokeless tobacco: Never  Vaping Use   Vaping Use: Never used  Substance and Sexual Activity   Alcohol use: No   Drug use: No   Sexual activity: Not Currently  Other Topics Concern   Not on file  Social History Narrative   Daily caffeine use    Social Determinants of Health   Financial Resource Strain: Low Risk  (01/26/2022)   Overall Financial Resource Strain (CARDIA)    Difficulty of Paying Living Expenses: Not hard at all  Food Insecurity: No Food Insecurity (01/26/2022)   Hunger Vital Sign    Worried About Running Out of Food in the  Last Year: Never true    Ran Out of Food in the Last Year: Never true  Transportation Needs: No Transportation Needs (01/26/2022)   PRAPARE - Hydrologist (Medical): No    Lack of Transportation (Non-Medical): No  Physical Activity: Not on file  Stress: Not on file  Social Connections: Not on file  Intimate Partner Violence: Not on file    Outpatient Medications Prior to Visit  Medication Sig Dispense Refill   cholecalciferol (VITAMIN D3) 25 MCG (1000 UT) tablet Take 5,000 Units by mouth daily.     clopidogrel (PLAVIX) 75 MG tablet Take 1 tablet (75 mg total) by mouth daily. 90 tablet 3   ezetimibe (ZETIA) 10 MG tablet TAKE 1 TABLET BY MOUTH DAILY 90 tablet 1   Lancets MISC 1 each by Does not apply route daily as needed. Please provide lancets that fit patients lancet device 100 each 4   metoprolol succinate (TOPROL-XL) 25 MG 24 hr tablet Take 0.5 tablets (12.5 mg total) by mouth daily. 30 tablet 2   Multiple Vitamin (MULTIVITAMIN) tablet Take 1 tablet by mouth daily.     nitroGLYCERIN (NITROSTAT) 0.4 MG SL tablet Place 1 tablet (0.4 mg total) under the tongue every 5 (five) minutes as needed for chest pain. 90 tablet 3   ONETOUCH ULTRA test strip AS DIRECTED 100 each 4   metFORMIN (GLUCOPHAGE) 500 MG tablet Take 500 mg by mouth in the morning, at noon, in the evening, and at bedtime.     furosemide (LASIX) 20 MG tablet Take 1 tablet (20 mg total) by mouth as needed. 90 tablet 3   No facility-administered medications prior to visit.    Allergies  Allergen Reactions   Lipitor [Atorvastatin Calcium] Other (See Comments)    Causes memory loss   Crestor [Rosuvastatin Calcium] Other (See Comments)    Causes Muscle Pain   Zocor [Simvastatin] Other (See Comments)    Causes muscle pain    Review of Systems  Constitutional:  Positive for appetite change, fatigue and unexpected weight change. Negative for chills and fever.  HENT:  Negative for congestion, ear  pain, sinus pain and sore throat.   Respiratory:  Positive for shortness of breath. Negative for cough.   Cardiovascular:  Negative for chest pain and palpitations.  Gastrointestinal:  Negative for abdominal pain, blood in stool, constipation, diarrhea, nausea and vomiting.  Endocrine: Negative for polydipsia.  Genitourinary:  Negative for dysuria.  Musculoskeletal:  Negative for back pain.  Skin:  Negative for rash.  Neurological:  Positive for weakness. Negative for headaches.       Objective:    Physical Exam Vitals reviewed.  Constitutional:      General: He is not in acute distress.    Appearance: Normal appearance.  HENT:     Head:  Normocephalic and atraumatic.     Right Ear: Tympanic membrane normal.     Left Ear: Tympanic membrane normal.     Nose: Nose normal.     Mouth/Throat:     Mouth: Mucous membranes are moist.     Pharynx: Oropharynx is clear.  Eyes:     Extraocular Movements: Extraocular movements intact.     Conjunctiva/sclera: Conjunctivae normal.     Pupils: Pupils are equal, round, and reactive to light.  Cardiovascular:     Rate and Rhythm: Normal rate and regular rhythm.     Pulses: Normal pulses.     Heart sounds: Normal heart sounds. No murmur heard.    No gallop.  Pulmonary:     Effort: Pulmonary effort is normal. No respiratory distress.     Breath sounds: Normal breath sounds. No wheezing.  Abdominal:     General: Abdomen is flat. Bowel sounds are normal. There is no distension.     Tenderness: There is no abdominal tenderness.  Musculoskeletal:        General: Normal range of motion.     Cervical back: Normal range of motion and neck supple.     Right lower leg: No edema.     Left lower leg: No edema.  Neurological:     Mental Status: He is alert.     BP (!) 90/58   Pulse 70   Temp 97.9 F (36.6 C)   Resp 15   Ht $R'5\' 9"'Bs$  (1.753 m)   Wt 126 lb (57.2 kg)   SpO2 99%   BMI 18.61 kg/m  Wt Readings from Last 3 Encounters:  03/03/22  126 lb (57.2 kg)  02/10/22 129 lb 9.6 oz (58.8 kg)  02/03/22 131 lb 9.6 oz (59.7 kg)    Health Maintenance Due  Topic Date Due   Zoster Vaccines- Shingrix (1 of 2) Never done   Pneumonia Vaccine 62+ Years old (1 - PCV) 10/04/2003   COVID-19 Vaccine (4 - Pfizer series) 07/25/2020   OPHTHALMOLOGY EXAM  07/03/2021   INFLUENZA VACCINE  02/10/2022       Lab Results  Component Value Date   TSH 2.684 01/24/2022   Lab Results  Component Value Date   WBC 5.9 02/03/2022   HGB 12.9 (L) 02/03/2022   HCT 38.4 02/03/2022   MCV 94 02/03/2022   PLT 161 02/03/2022   Lab Results  Component Value Date   NA 135 02/19/2022   K 4.3 02/19/2022   CHLORIDE 103 07/14/2016   CO2 23 02/19/2022   GLUCOSE 170 (H) 02/19/2022   BUN 22 02/19/2022   CREATININE 1.09 02/19/2022   BILITOT 1.6 (H) 02/03/2022   ALKPHOS 64 02/03/2022   AST 17 02/03/2022   ALT 20 02/03/2022   PROT 6.8 02/03/2022   ALBUMIN 4.0 02/03/2022   CALCIUM 9.9 02/19/2022   ANIONGAP 9 02/19/2022   EGFR 87 02/03/2022   Lab Results  Component Value Date   CHOL 166 02/03/2022   Lab Results  Component Value Date   HDL 61 02/03/2022   Lab Results  Component Value Date   LDLCALC 85 02/03/2022   Lab Results  Component Value Date   TRIG 109 02/03/2022   Lab Results  Component Value Date   CHOLHDL 2.7 02/03/2022   Lab Results  Component Value Date   HGBA1C 6.4 (H) 02/03/2022       Assessment & Plan:   Problem List Items Addressed This Visit       Cardiovascular and  Mediastinum   Coronary artery disease of native artery of native heart with stable angina pectoris (HCC)   Type 2 diabetes mellitus with diabetic peripheral angiopathy without gangrene ([place]) - Primary   Relevant Medications   metFORMIN (GLUCOPHAGE) 500 MG tablet An individual care plan for diabetes was established and reinforced today.  The patient's status was assessed using clinical findings on exam, labs and diagnostic testing. Patient success at  meeting goals based on disease specific evidence-based guidelines and found to be good controlled. Medications were assessed and patient's understanding of the medical issues , including barriers were assessed.cut metformin to 552m bid from 10023mRecommend adherence to a diabetic diet, a graduated exercise program, HgbA1c level is checked quarterly, and urine microalbumin performed yearly .  Annual mono-filament sensation testing performed. Lower blood pressure and control hyperlipidemia is important. Get annual eye exams and annual flu shots and smoking cessation discussed.  Self management goals were discussed.      Other   Adult BMI <19 kg/sq m Supplement nutrition with protein/calorie supplement with meals to improve nutritional status.    Other Visit Diagnoses     Malnutrition of moderate degree (HCC)       Relevant Medications   megestrol (MEGACE) 20 MG tablet Try megace to increase appetite, I discussed with dietitian      Meds ordered this encounter  Medications   metFORMIN (GLUCOPHAGE) 500 MG tablet    Sig: Take 1 tablet (500 mg total) by mouth 2 (two) times daily with a meal.    Dispense:  180 tablet    Refill:  2   megestrol (MEGACE) 20 MG tablet    Sig: Take 1 tablet (20 mg total) by mouth daily.    Dispense:  30 tablet    Refill:  3       Follow-up: Return in about 1 month (around 04/03/2022), or bp.  An After Visit Summary was printed and given to the patient.  LaReinaldo MeekerMD [facility] ([phone]

## 2022-03-30 ENCOUNTER — Encounter (HOSPITAL_COMMUNITY): Payer: Self-pay | Admitting: Cardiology

## 2022-03-30 ENCOUNTER — Ambulatory Visit (HOSPITAL_COMMUNITY)
Admission: RE | Admit: 2022-03-30 | Discharge: 2022-03-30 | Disposition: A | Discharge status: Home / Self Care | Payer: Medicare HMO | Source: Ambulatory Visit | Attending: Cardiology | Admitting: Cardiology

## 2022-03-30 VITALS — BP 100/64 | HR 76 | Wt 131.0 lb

## 2022-03-30 DIAGNOSIS — Z79899 Other long term (current) drug therapy: Secondary | ICD-10-CM | POA: Diagnosis not present

## 2022-03-30 DIAGNOSIS — I110 Hypertensive heart disease with heart failure: Secondary | ICD-10-CM | POA: Diagnosis not present

## 2022-03-30 DIAGNOSIS — I25810 Atherosclerosis of coronary artery bypass graft(s) without angina pectoris: Secondary | ICD-10-CM | POA: Insufficient documentation

## 2022-03-30 DIAGNOSIS — I2510 Atherosclerotic heart disease of native coronary artery without angina pectoris: Secondary | ICD-10-CM | POA: Diagnosis not present

## 2022-03-30 DIAGNOSIS — I081 Rheumatic disorders of both mitral and tricuspid valves: Secondary | ICD-10-CM | POA: Diagnosis not present

## 2022-03-30 DIAGNOSIS — I5032 Chronic diastolic (congestive) heart failure: Secondary | ICD-10-CM

## 2022-03-30 DIAGNOSIS — I951 Orthostatic hypotension: Secondary | ICD-10-CM | POA: Diagnosis not present

## 2022-03-30 DIAGNOSIS — E119 Type 2 diabetes mellitus without complications: Secondary | ICD-10-CM | POA: Diagnosis not present

## 2022-03-30 DIAGNOSIS — I5022 Chronic systolic (congestive) heart failure: Secondary | ICD-10-CM | POA: Insufficient documentation

## 2022-03-30 DIAGNOSIS — Z7902 Long term (current) use of antithrombotics/antiplatelets: Secondary | ICD-10-CM | POA: Insufficient documentation

## 2022-03-30 DIAGNOSIS — I255 Ischemic cardiomyopathy: Secondary | ICD-10-CM | POA: Diagnosis not present

## 2022-03-30 DIAGNOSIS — Z955 Presence of coronary angioplasty implant and graft: Secondary | ICD-10-CM | POA: Insufficient documentation

## 2022-03-30 DIAGNOSIS — Z951 Presence of aortocoronary bypass graft: Secondary | ICD-10-CM | POA: Diagnosis not present

## 2022-03-30 DIAGNOSIS — I340 Nonrheumatic mitral (valve) insufficiency: Secondary | ICD-10-CM

## 2022-03-30 LAB — BASIC METABOLIC PANEL
Anion gap: 7 (ref 5–15)
BUN: 14 mg/dL (ref 8–23)
CO2: 22 mmol/L (ref 22–32)
Calcium: 9.3 mg/dL (ref 8.9–10.3)
Chloride: 104 mmol/L (ref 98–111)
Creatinine, Ser: 0.73 mg/dL (ref 0.61–1.24)
GFR, Estimated: >60 mL/min (ref >60)
Glucose, Bld: 129 mg/dL — ABNORMAL HIGH (ref 70–99)
Potassium: 3.8 mmol/L (ref 3.5–5.1)
Sodium: 133 mmol/L — ABNORMAL LOW (ref 135–145)

## 2022-03-30 LAB — BRAIN NATRIURETIC PEPTIDE: B Natriuretic Peptide: 1014.2 pg/mL — ABNORMAL HIGH (ref 0.0–100.0)

## 2022-03-30 MED ORDER — SPIRONOLACTONE 25 MG PO TABS: 12.5000 mg | ORAL_TABLET | Freq: Every evening | ORAL | 3 refills | Status: DC

## 2022-03-30 NOTE — Progress Notes (Signed)
Primary Care: Dr. Henrene Pastor HF Cardiology: Dr. Aundra Dubin  HPI: Patient referred to HF clinic from transition of care clinic for evaluation of CHF.   81 y.o. male with history of CAD s/p CABG in 2003 with subsequent PCI in 2007, DM II, chronic systolic CHF. He has been followed by Dr. Acie Fredrickson in the Cardiology clinic. Echo in 6/22 showed EF 50-55%, RV okay, RVSP normal, mild to moderate MR  He was admitted on 01/24/22 with progressive weakness and dyspnea. Echo with EF < 20%, RV mildly reduced, RVSP 80 mmHg, moderate to severe MR with concern for torn chord, mild to moderate TR.  [facility] 01/26/22 showed LIMA to LAD patent, patent SVG to distal RCA, occluded SVG to diagonal, SVG to Cx is occluded, RA mean 6, PA 41/23 (30), PCWP mean 21 (v 28), PA sat 58%, Fick CO/CI 4.5/2.6, Thermo CO/CI 3.4/1.9.  No targets for intervention on LHC.  TEE 01/28/22 showed EF < 20%, moderate to severe functional MR, highly mobile echogenic mass appears to be papillary muscle, RV moderately reduced.  He was diuresed with IV lasix. Difficulty tolerating GDMT due to low blood pressures.   He was seen by Dr. Burt Knack for consideration of mTEER.  Titration of GDMT recommended with repeat echo in 2-3 months once on optimal medical therapy.   He was started on cardiac medications, but subsequently, spironolactone, Jardiance, and losartan were all stopped due to hypotension and orthostasis.  Patient feels like the problems really started when he began [name].   He feels like he is doing better off Jardiance.  Currently, no lightheadedness.  No orthopnea/PND.  Generally, no dyspnea walking around the house.  He is able to walk around [place].  No chest pain.  No orthopnea/PND.    ECG (personally reviewed): NSR, PVC, LVH with repolarization abnormality.   Labs (723): LDL 85 Labs (8/23): K 4.3, creatinine 1.19  Review of Systems: All systems reviewed and negative except as per HPI.   PMH: 1. BPH 2. COPD 3. HTN 4. H/o ITP 5. Type 2  diabetes 6. Hyperlipidemia 7. CAD: S/p CABG.  - LHC 01/26/22 showed LIMA to LAD patent, patent SVG to distal RCA, occluded SVG to diagonal, SVG to Cx is occluded 8. Chronic systolic CHF: Ischemic cardiomyopathy.  - Echo (7/23): EF < 20%, RV mildly reduced, RVSP 80 mmHg, moderate to severe MR with concern for torn chord, mild to moderate TR. - RHC (7/23):  RA mean 6, PA 41/23 (30), PCWP mean 21 (v 28), PA sat 58%, Fick CO/CI 4.5/2.6, Thermo CO/CI 3.4/1.9 9. Mitral regurgitation: Functional.  - TEE 01/28/22 showed EF < 20%, moderate to severe functional MR, highly mobile echogenic mass appears to be papillary muscle, RV moderately reduced.    Current Outpatient Medications  Medication Sig Dispense Refill   cholecalciferol (VITAMIN D3) 25 MCG (1000 UT) tablet Take 5,000 Units by mouth daily.     clopidogrel (PLAVIX) 75 MG tablet Take 1 tablet (75 mg total) by mouth daily. 90 tablet 3   ezetimibe (ZETIA) 10 MG tablet TAKE 1 TABLET BY MOUTH DAILY 90 tablet 1   Insulin Pen Needle (BD PEN NEEDLE NANO U/F) 32G X 4 MM MISC 1 each by Does not apply route daily. 100 each 3   Lancets MISC 1 each by Does not apply route daily as needed. Please provide lancets that fit patients lancet device 100 each 4   megestrol (MEGACE) 20 MG tablet Take 1 tablet (20 mg total) by mouth daily. 30 tablet 3  metFORMIN (GLUCOPHAGE) 500 MG tablet Take 500 mg by mouth in the morning, at noon, in the evening, and at bedtime.     metoprolol succinate (TOPROL-XL) 25 MG 24 hr tablet Take 0.5 tablets (12.5 mg total) by mouth daily. 30 tablet 2   Multiple Vitamin (MULTIVITAMIN) tablet Take 1 tablet by mouth daily.     nitroGLYCERIN (NITROSTAT) 0.4 MG SL tablet Place 1 tablet (0.4 mg total) under the tongue every 5 (five) minutes as needed for chest pain. 90 tablet 3   ONETOUCH ULTRA test strip AS DIRECTED 100 each 4   spironolactone (ALDACTONE) 25 MG tablet Take 0.5 tablets (12.5 mg total) by mouth at bedtime. 45 tablet 3   No  current facility-administered medications for this encounter.    Allergies  Allergen Reactions   Lipitor [Atorvastatin Calcium] Other (See Comments)    Causes memory loss   Crestor [Rosuvastatin Calcium] Other (See Comments)    Causes Muscle Pain   Zocor [Simvastatin] Other (See Comments)    Causes muscle pain      Social History   Socioeconomic History   Marital status: Married    Spouse name: Doris   Number of children: 3   Years of education: Not on file   Highest education level: High school graduate  Occupational History   Occupation: Retired   Tobacco Use   Smoking status: Never   Smokeless tobacco: Never  Vaping Use   Vaping Use: Never used  Substance and Sexual Activity   Alcohol use: No   Drug use: No   Sexual activity: Not Currently  Other Topics Concern   Not on file  Social History Narrative   Daily caffeine use    Social Determinants of Health   Financial Resource Strain: Low Risk  (01/26/2022)   Overall Financial Resource Strain (CARDIA)    Difficulty of Paying Living Expenses: Not hard at all  Food Insecurity: No Food Insecurity (01/26/2022)   Hunger Vital Sign    Worried About Running Out of Food in the Last Year: Never true    [place] in the Last Year: Never true  Transportation Needs: No Transportation Needs (01/26/2022)   PRAPARE - Hydrologist (Medical): No    Lack of Transportation (Non-Medical): No  Physical Activity: Not on file  Stress: Not on file  Social Connections: Not on file  Intimate Partner Violence: Not on file      Family History  Problem Relation Age of Onset   Cerebrovascular Accident Mother    Colon cancer Neg Hx     Vitals:   03/30/22 1126  BP: 100/64  Pulse: 76  SpO2: 99%  Weight: 59.4 kg (131 lb)    PHYSICAL EXAM: General: NAD Neck: No JVD, no thyromegaly or thyroid nodule.  Lungs: Clear to auscultation bilaterally with normal respiratory effort. CV: Nondisplaced PMI.   Heart regular S1/S2, no S3/S4, 1/6 HSM apex.  No peripheral edema.  No carotid bruit.  Normal pedal pulses.  Abdomen: Soft, nontender, no hepatosplenomegaly, no distention.  Skin: Intact without lesions or rashes.  Neurologic: Alert and oriented x 3.  Psych: Normal affect. Extremities: No clubbing or cyanosis.  HEENT: Normal.   ASSESSMENT & PLAN: 1. Chronic systolic CHF: Ischemic cardiomyopathy. Echo 7/23 showed EF < 20%, RV mildly reduced, RVSP 80 mmHg, moderate to severe MR, mild to moderate TR.  R/LHC in 7/23 showed LIMA to LAD patent, patent SVG to distal RCA, occluded SVG to diagonal, SVG  to Cx is occluded, mildly elevated PCWP, Fick CO/CI 4.5/2.6, Thermo CO/CI 3.4/1.9.  No targets for intervention on LHC.  TEE in 7/23 showed EF < 20%, moderate to severe functional MR, highly mobile echogenic mass appears to be papillary muscle, RV moderately reduced. NYHA class II symptoms.  GDMT has been limited by hypotension/orthostasis.   - He seems to be intolerant of Jardiance.  - Continue Toprol XL 12.5 mg daily - Restart spironolactone 12.5 mg qhs.  BMET/BNP today and BMET in 10 days.  - If he tolerates spironolactone, restart losartan 12.5 mg daily next visit.  - Has repeat echo scheduled for October 2. Mitral regurgitation: TEE in 7/23 suggests moderate-severe MR, likely functional.  Seen by structural heart team during 7/23 admit. It was recommended to optimize GDMT for HF and repeat echo in 2-3 months. If LV function does not improve with medical therapy, mTEER may be considered. - Has echo and f/u with Dr. Burt Knack 05/08/22.  3. CAD: Hx CABG in 2003 and PCI in 2007.  LHC 07/23 with LIMA to LAD patent, patent SVG to distal RCA, occluded SVG to diagonal, SVG to Cx is occluded. No targets for intervention. No chest pain.  - Continue Plavix 75 mg daily.  - Did not tolerate statins due to myalgias and memory loss, has been on Zetia. Recent LDL 85. Consider lipid clinic referral for [place].    Followup with HF pharmacist in 3 wks for medication titration, see me 2 months.   Loralie Champagne 03/30/2022

## 2022-03-30 NOTE — Patient Instructions (Signed)
START Spironolactone 12.'5mg'$  every evening   Labs done today, your results will be available in MyChart, we will contact you for abnormal readings.  Repeat blood work in 10 days  Please follow up with our heart failure pharmacist in 3 weeks.  Your physician recommends that you schedule a follow-up appointment in: 2 months  If you have any questions or concerns before your next appointment please send [place] a message through [place] or call our office at [phone].    TO LEAVE A MESSAGE FOR THE NURSE SELECT OPTION 2, PLEASE LEAVE A MESSAGE INCLUDING: YOUR NAME DATE OF BIRTH CALL BACK NUMBER REASON FOR CALL**this is important as we prioritize the call backs  YOU WILL RECEIVE A CALL BACK THE SAME DAY AS LONG AS YOU CALL BEFORE 4:00 PM  At the [facility], you and your health needs are our priority. As part of our continuing mission to provide you with exceptional heart care, we have created designated Provider Care Teams. These Care Teams include your primary Cardiologist (physician) and Advanced Practice Providers (APPs- Physician Assistants and Nurse Practitioners) who all work together to provide you with the care you need, when you need it.   You may see any of the following providers on your designated Care Team at your next follow up: Dr Glori Bickers Dr Loralie Champagne Dr. Roxana Hires, NP Lyda Jester, [place] [facility] [place], [place] Forestine Na, NP Audry Riles, PharmD   Please be sure to bring in all your medications bottles to every appointment.

## 2022-03-31 DIAGNOSIS — Z961 Presence of intraocular lens: Secondary | ICD-10-CM | POA: Diagnosis not present

## 2022-04-03 DIAGNOSIS — Z0100 Encounter for examination of eyes and vision without abnormal findings: Secondary | ICD-10-CM | POA: Diagnosis not present

## 2022-04-06 NOTE — Progress Notes (Signed)
Advanced Heart Failure Clinic Note   Primary Care: Dr. Henrene Klein HF Cardiology: Dr. Aundra Klein  HPI:  [name] is an 81 y.o. male with history of CAD s/p CABG in 2003 with subsequent PCI in 2007, DM II, chronic systolic CHF. He has been followed by Dr. Acie Klein in the Cardiology clinic. Echo in 12/2020 showed EF 50-55%, RV okay, RVSP normal, mild to moderate MR.   He was admitted on 01/24/22 with progressive weakness and dyspnea. Echo with EF < 20%, RV mildly reduced, RVSP 80 mmHg, moderate to severe MR with concern for torn chord, mild to moderate TR.  [name] 01/26/22 showed LIMA to LAD patent, patent SVG to distal RCA, occluded SVG to diagonal, SVG to Cx is occluded, RA mean 6, PA 41/23 (30), PCWP mean 21 (v 28), PA sat 58%, Fick CO/CI 4.5/2.6, Thermo CO/CI 3.4/1.9.  No targets for intervention on LHC.  TEE 01/28/22 showed EF < 20%, moderate to severe functional MR, highly mobile echogenic mass appears to be papillary muscle, RV moderately reduced.  He was diuresed with IV Lasix. Difficulty tolerating GDMT due to low blood pressures.   He was seen by Dr. Burt Klein for consideration of mTEER.  Titration of GDMT recommended with repeat echo in 2-3 months once on optimal medical therapy.    He was started on cardiac medications, but subsequently, spironolactone, Jardiance, and losartan were all stopped due to hypotension and orthostasis.  Patient feels like the problems really started when he began [name].    Presented to AHF Clinic on 03/30/22 with Dr. Aundra Klein. He felt like he was doing better off Jardiance.  No lightheadedness.  No orthopnea/PND.  Generally, no dyspnea walking around the house.  He was able to walk around [name].  No chest pain.  No orthopnea/PND.  Today he returns to HF clinic for pharmacist medication titration. At last visit with MD spironolactone 12.5 mg daily was restarted. Unfortunately he has not felt well since spironolactone was initiated. Says he has no energy or strength in his  legs. He stumbles a lot and his balance has gotten worse. He is very worried about falling. SBP sitting down at home when checked by therapist is 101-105 and drops 15 points when standing. He is also urinating a lot more, getting up 3-4 times per night to use the restroom. Weight has been stable since last visit, ~129 lbs. Says the Megace has helped improve his appetite, used to only get hungry every few days, now can eat more. No LEE, PND or orthopnea. Used to go outside and walk for exercise but has stopped because he is worried about falling. Still able to walk 10-12 laps around his house before resting.     HF Medications: Metoprolol succinate 12.5 mg daily Spironolactone 12.5 mg daily  Has the patient been experiencing any side effects to the medications prescribed?  Yes - orthostasis and poor balance since spironolactone restarted.   Does the patient have any problems obtaining medications due to transportation or finances?   no  Understanding of regimen: good Understanding of indications: good Potential of compliance: good Patient understands to avoid NSAIDs. Patient understands to avoid decongestants.    Pertinent Lab Values: 04/09/22: Serum creatinine 0.80, BUN 15, Potassium 4.5, Sodium 131  Vital Signs: Weight: 129.6 lbs (last clinic weight: 131 lbs) Blood pressure: 110/64 sitting, 106/66 standing  Heart rate: 76   Assessment/Plan: 1. Chronic systolic CHF: Ischemic cardiomyopathy. Echo 01/2022 showed EF < 20%, RV mildly reduced, RVSP 80 mmHg, moderate  to severe MR, mild to moderate TR.  R/LHC in 01/2022 showed LIMA to LAD patent, patent SVG to distal RCA, occluded SVG to diagonal, SVG to Cx is occluded, mildly elevated PCWP, Fick CO/CI 4.5/2.6, Thermo CO/CI 3.4/1.9.  No targets for intervention on LHC.  TEE in 01/2022 showed EF < 20%, moderate to severe functional MR, highly mobile echogenic mass appears to be papillary muscle, RV moderately reduced.  - NYHA class II symptoms.   GDMT has been limited by hypotension/orthostasis.   - Continue metoprolol XL 12.5 mg daily - Energy level and balance have worsened significantly since restarting spironolactone. BP ok in clinic, but has been orthostatic at home when checked. He is a high fall risk. Stop spironolactone.  - He seems to be intolerant of Jardiance.  - Has repeat echo scheduled for October 2. Mitral regurgitation: TEE in 01/2022 suggests moderate-severe MR, likely functional.  Seen by structural heart team during 01/2022 admit. It was recommended to optimize GDMT for HF and repeat echo in 2-3 months. If LV function does not improve with medical therapy, mTEER may be considered. - Has echo and f/u with Dr. Burt Klein 05/08/22.  3. CAD: Hx CABG in 2003 and PCI in 2007.  LHC 07/23 with LIMA to LAD patent, patent SVG to distal RCA, occluded SVG to diagonal, SVG to Cx is occluded. No targets for intervention. No chest pain.  - Continue Plavix 75 mg daily.  - Did not tolerate statins due to myalgias and memory loss, has been on Zetia. Recent LDL 85. Consider lipid clinic referral for [name].   Follow up 4 weeks with Dr. Rush Klein, PharmD, BCPS, [facility], [place] [facility] Pharmacist [phone]

## 2022-04-07 ENCOUNTER — Encounter: Payer: Self-pay | Admitting: Legal Medicine

## 2022-04-07 ENCOUNTER — Ambulatory Visit (INDEPENDENT_AMBULATORY_CARE_PROVIDER_SITE_OTHER): Payer: Medicare HMO | Admitting: Legal Medicine

## 2022-04-07 VITALS — BP 90/50 | HR 79 | Temp 98.1°F | Resp 15 | Ht 69.0 in | Wt 129.0 lb

## 2022-04-07 DIAGNOSIS — I10 Essential (primary) hypertension: Secondary | ICD-10-CM | POA: Diagnosis not present

## 2022-04-07 NOTE — Progress Notes (Signed)
Subjective:  Patient ID: [name], male    DOB: 1940-09-07  Age: 81 y.o. MRN: [id number]  Chief Complaint  Patient presents with   Hypertension    HPI   Patient is checking his blood pressure at home and the systolic range is 341-962' and diastolic 22-97. He denies chest pain, dizziness, lightheaded.  Current Outpatient Medications on File Prior to Visit  Medication Sig Dispense Refill   cholecalciferol (VITAMIN D3) 25 MCG (1000 UT) tablet Take 5,000 Units by mouth daily.     clopidogrel (PLAVIX) 75 MG tablet Take 1 tablet (75 mg total) by mouth daily. 90 tablet 3   ezetimibe (ZETIA) 10 MG tablet TAKE 1 TABLET BY MOUTH DAILY 90 tablet 1   Insulin Pen Needle (BD PEN NEEDLE NANO U/F) 32G X 4 MM MISC 1 each by Does not apply route daily. 100 each 3   Lancets MISC 1 each by Does not apply route daily as needed. Please provide lancets that fit patients lancet device 100 each 4   megestrol (MEGACE) 40 MG tablet Take 20 mg by mouth daily.     metFORMIN (GLUCOPHAGE) 500 MG tablet Take 500 mg by mouth in the morning, at noon, in the evening, and at bedtime.     metoprolol succinate (TOPROL-XL) 25 MG 24 hr tablet Take 0.5 tablets (12.5 mg total) by mouth daily. 30 tablet 2   Multiple Vitamin (MULTIVITAMIN) tablet Take 1 tablet by mouth daily.     nitroGLYCERIN (NITROSTAT) 0.4 MG SL tablet Place 1 tablet (0.4 mg total) under the tongue every 5 (five) minutes as needed for chest pain. 90 tablet 3   ONETOUCH ULTRA test strip AS DIRECTED 100 each 4   spironolactone (ALDACTONE) 25 MG tablet Take 0.5 tablets (12.5 mg total) by mouth at bedtime. 45 tablet 3   No current facility-administered medications on file prior to visit.   Past Medical History:  Diagnosis Date   Acute exacerbation of CHF (congestive heart failure) ([place]) 09/29/2011   Atherosclerotic heart disease of native coronary artery without angina pectoris 02/12/2010   Qualifier: Diagnosis of  By: Marland Mcalpine     Benign  prostatic hyperplasia with lower urinary tract symptoms 12/04/2019   CAD (coronary artery disease)    Hx of    Cellulitis 03/19/2012   Chronic obstructive pulmonary disease ([place]) 12/04/2019   Diverticulosis of colon (without mention of hemorrhage) 2011   Colonoscopy    Hiatal hernia 2011   EGD   Hypertension    Hypertensive heart disease without heart failure 12/04/2019   ITP (idiopathic thrombocytopenic purpura)    He has been diagnosed with   Pancytopenia    Thrombocytopenia, unspecified ([place]) 01/24/2013   TRANSAMINASES, SERUM, ELEVATED 02/12/2010   Qualifier: Diagnosis of  By: Marland Mcalpine     Type 2 diabetes mellitus with diabetic peripheral angiopathy without gangrene ([place]) 12/04/2019   Past Surgical History:  Procedure Laterality Date   CARDIAC CATHETERIZATION     His last heart catheterization in May of 2009 reveals a patent LIMA   CORONARY ANGIOPLASTY     Successful percutaneous transluminal coronary angioplasty of the left circumflex obuse marginal vessel   CORONARY ANGIOPLASTY WITH STENT PLACEMENT     CORONARY ARTERY BYPASS GRAFT     x4 with a left internal mammary artery anastomosis to the left anterior descending coronary artery    CORONARY STENT PLACEMENT     successful percutaneous transluminal coronary angioplasty and stenting of the left main coronary  artery   HERNIA REPAIR     RIGHT/LEFT HEART CATH AND CORONARY/GRAFT ANGIOGRAPHY N/A 01/26/2022   Procedure: RIGHT/LEFT HEART CATH AND CORONARY/GRAFT ANGIOGRAPHY;  Surgeon: Troy Sine, MD;  Location: [facility];  Service: Cardiovascular;  Laterality: N/A;   SAPHENOUS VEIN GRAFT RESECTION     graft to the first obtuse marginal, a saphenous vein graft to the first diagonal coronary artery  and a saphenous vein graft to the distal right coronary artery   TEE WITHOUT CARDIOVERSION N/A 01/28/2022   Procedure: TRANSESOPHAGEAL ECHOCARDIOGRAM (TEE);  Surgeon: Berniece Salines, DO;  Location: [facility];   Service: Cardiovascular;  Laterality: N/A;   US ECHOCARDIOGRAPHY  02-08-2007   Est. EF 40-45%    Family History  Problem Relation Age of Onset   Cerebrovascular Accident Mother    Colon cancer Neg Hx    Social History   Socioeconomic History   Marital status: Married    Spouse name: Doris   Number of children: 3   Years of education: Not on file   Highest education level: High school graduate  Occupational History   Occupation: Retired   Tobacco Use   Smoking status: Never   Smokeless tobacco: Never  Vaping Use   Vaping Use: Never used  Substance and Sexual Activity   Alcohol use: No   Drug use: No   Sexual activity: Not Currently  Other Topics Concern   Not on file  Social History Narrative   Daily caffeine use    Social Determinants of Health   Financial Resource Strain: Low Risk  (01/26/2022)   Overall Financial Resource Strain (CARDIA)    Difficulty of Paying Living Expenses: Not hard at all  Food Insecurity: No Food Insecurity (01/26/2022)   Hunger Vital Sign    Worried About Running Out of Food in the Last Year: Never true    Hometown in the Last Year: Never true  Transportation Needs: No Transportation Needs (01/26/2022)   PRAPARE - Hydrologist (Medical): No    Lack of Transportation (Non-Medical): No  Physical Activity: Not on file  Stress: Not on file  Social Connections: Not on file    Review of Systems  Constitutional:  Negative for chills, fatigue, fever and unexpected weight change.  HENT:  Negative for congestion, ear pain, sinus pain and sore throat.   Eyes:  Negative for visual disturbance.  Respiratory:  Positive for shortness of breath. Negative for cough.   Cardiovascular:  Negative for chest pain and palpitations.  Gastrointestinal:  Negative for abdominal pain, blood in stool, constipation, diarrhea, nausea and vomiting.  Endocrine: Negative for polydipsia.  Genitourinary:  Negative for dysuria.   Musculoskeletal:  Negative for back pain.  Skin:  Negative for rash.  Neurological:  Negative for headaches.     Objective:  BP (!) 90/50   Pulse 79   Temp 98.1 F (36.7 C)   Resp 15   Ht '5\' 9"'$  (1.753 m)   Wt 129 lb (58.5 kg)   SpO2 99%   BMI 19.05 kg/m      04/07/2022    1:53 PM 03/30/2022   11:26 AM 03/03/2022   10:30 AM  BP/Weight  Systolic BP 90 010 90  Diastolic BP 50 64 58  Wt. (Lbs) 129 131 126  BMI 19.05 kg/m2 19.35 kg/m2 18.61 kg/m2    Physical Exam Vitals reviewed.  Constitutional:      General: He is not in acute distress.  Appearance: Normal appearance.  HENT:     Head: Normocephalic.     Right Ear: Tympanic membrane normal.     Left Ear: Tympanic membrane normal.     Nose: Nose normal.     Mouth/Throat:     Mouth: Mucous membranes are moist.     Pharynx: Oropharynx is clear.  Eyes:     Extraocular Movements: Extraocular movements intact.     Conjunctiva/sclera: Conjunctivae normal.     Pupils: Pupils are equal, round, and reactive to light.  Cardiovascular:     Rate and Rhythm: Normal rate and regular rhythm.     Pulses: Normal pulses.     Heart sounds: Normal heart sounds. No murmur heard.    No gallop.  Pulmonary:     Effort: Pulmonary effort is normal. No respiratory distress.     Breath sounds: Normal breath sounds. No wheezing.  Abdominal:     General: Abdomen is flat. Bowel sounds are normal. There is no distension.     Palpations: Abdomen is soft.     Tenderness: There is no abdominal tenderness.  Musculoskeletal:        General: Normal range of motion.     Cervical back: Normal range of motion and neck supple.     Right lower leg: No edema.     Left lower leg: No edema.  Skin:    General: Skin is warm.     Capillary Refill: Capillary refill takes less than 2 seconds.  Neurological:     General: No focal deficit present.     Mental Status: He is alert and oriented to person, place, and time. Mental status is at baseline.      Gait: Gait normal.  Psychiatric:        Mood and Affect: Mood normal.        Thought Content: Thought content normal.         Lab Results  Component Value Date   WBC 5.9 02/03/2022   HGB 12.9 (L) 02/03/2022   HCT 38.4 02/03/2022   PLT 161 02/03/2022   GLUCOSE 129 (H) 03/30/2022   CHOL 166 02/03/2022   TRIG 109 02/03/2022   HDL 61 02/03/2022   LDLCALC 85 02/03/2022   ALT 20 02/03/2022   AST 17 02/03/2022   NA 133 (L) 03/30/2022   K 3.8 03/30/2022   CL 104 03/30/2022   CREATININE 0.73 03/30/2022   BUN 14 03/30/2022   CO2 22 03/30/2022   TSH 2.684 01/24/2022   INR 1.3 (H) 03/24/2011   HGBA1C 6.4 (H) 02/03/2022   MICROALBUR 10 05/13/2021      Assessment & Plan:   Problem List Items Addressed This Visit       Cardiovascular and Mediastinum   Benign essential HTN - Primary An individual hypertension care plan was established and reinforced today.  The patient's status was assessed using clinical findings on exam and labs or diagnostic tests. The patient's success at meeting treatment goals on disease specific evidence-based guidelines and found to be well controlled. SELF MANAGEMENT: The patient and I together assessed ways to personally work towards obtaining the recommended goals. RECOMMENDATIONS: avoid decongestants found in common cold remedies, decrease consumption of alcohol, perform routine monitoring of BP with home BP cuff, exercise, reduction of dietary salt, take medicines as prescribed, try not to miss doses and quit smoking.  Regular exercise and maintaining a healthy weight is needed.  Stress reduction may help. A CLINICAL SUMMARY including written plan identify barriers to  care unique to individual due to social or financial issues.  We attempt to mutually creat solutions for individual and family understanding.   .       Follow-up: Return in about 4 months (around 08/07/2022).  An After Visit Summary was printed and given to the patient.  Reinaldo Meeker, MD [facility] [phone]

## 2022-04-09 ENCOUNTER — Ambulatory Visit (HOSPITAL_COMMUNITY)
Admission: RE | Admit: 2022-04-09 | Discharge: 2022-04-09 | Disposition: A | Discharge status: Home / Self Care | Payer: Medicare HMO | Source: Ambulatory Visit | Attending: Cardiology | Admitting: Cardiology

## 2022-04-09 DIAGNOSIS — I5032 Chronic diastolic (congestive) heart failure: Secondary | ICD-10-CM | POA: Diagnosis not present

## 2022-04-09 LAB — BASIC METABOLIC PANEL
Anion gap: 7 (ref 5–15)
BUN: 15 mg/dL (ref 8–23)
CO2: 23 mmol/L (ref 22–32)
Calcium: 9.3 mg/dL (ref 8.9–10.3)
Chloride: 101 mmol/L (ref 98–111)
Creatinine, Ser: 0.8 mg/dL (ref 0.61–1.24)
GFR, Estimated: >60 mL/min (ref >60)
Glucose, Bld: 149 mg/dL — ABNORMAL HIGH (ref 70–99)
Potassium: 4.5 mmol/L (ref 3.5–5.1)
Sodium: 131 mmol/L — ABNORMAL LOW (ref 135–145)

## 2022-04-19 DIAGNOSIS — I4891 Unspecified atrial fibrillation: Secondary | ICD-10-CM | POA: Diagnosis not present

## 2022-04-19 DIAGNOSIS — E1151 Type 2 diabetes mellitus with diabetic peripheral angiopathy without gangrene: Secondary | ICD-10-CM | POA: Diagnosis not present

## 2022-04-19 DIAGNOSIS — I25119 Atherosclerotic heart disease of native coronary artery with unspecified angina pectoris: Secondary | ICD-10-CM | POA: Diagnosis not present

## 2022-04-19 DIAGNOSIS — I110 Hypertensive heart disease with heart failure: Secondary | ICD-10-CM | POA: Diagnosis not present

## 2022-04-19 DIAGNOSIS — I509 Heart failure, unspecified: Secondary | ICD-10-CM | POA: Diagnosis not present

## 2022-04-19 DIAGNOSIS — H3530 Unspecified macular degeneration: Secondary | ICD-10-CM | POA: Diagnosis not present

## 2022-04-19 DIAGNOSIS — Z681 Body mass index (BMI) 19 or less, adult: Secondary | ICD-10-CM | POA: Diagnosis not present

## 2022-04-19 DIAGNOSIS — Z7902 Long term (current) use of antithrombotics/antiplatelets: Secondary | ICD-10-CM | POA: Diagnosis not present

## 2022-04-19 DIAGNOSIS — D6869 Other thrombophilia: Secondary | ICD-10-CM | POA: Diagnosis not present

## 2022-04-19 DIAGNOSIS — E46 Unspecified protein-calorie malnutrition: Secondary | ICD-10-CM | POA: Diagnosis not present

## 2022-04-19 DIAGNOSIS — I252 Old myocardial infarction: Secondary | ICD-10-CM | POA: Diagnosis not present

## 2022-04-19 DIAGNOSIS — E785 Hyperlipidemia, unspecified: Secondary | ICD-10-CM | POA: Diagnosis not present

## 2022-04-22 ENCOUNTER — Ambulatory Visit (HOSPITAL_COMMUNITY)
Admission: RE | Admit: 2022-04-22 | Discharge: 2022-04-22 | Disposition: A | Discharge status: Home / Self Care | Payer: Medicare HMO | Source: Ambulatory Visit | Attending: Cardiology | Admitting: Cardiology

## 2022-04-22 VITALS — BP 110/64 | HR 76 | Wt 129.6 lb

## 2022-04-22 DIAGNOSIS — I255 Ischemic cardiomyopathy: Secondary | ICD-10-CM | POA: Insufficient documentation

## 2022-04-22 DIAGNOSIS — Z79899 Other long term (current) drug therapy: Secondary | ICD-10-CM | POA: Insufficient documentation

## 2022-04-22 DIAGNOSIS — Z951 Presence of aortocoronary bypass graft: Secondary | ICD-10-CM | POA: Diagnosis not present

## 2022-04-22 DIAGNOSIS — I340 Nonrheumatic mitral (valve) insufficiency: Secondary | ICD-10-CM | POA: Diagnosis not present

## 2022-04-22 DIAGNOSIS — E119 Type 2 diabetes mellitus without complications: Secondary | ICD-10-CM | POA: Insufficient documentation

## 2022-04-22 DIAGNOSIS — I2510 Atherosclerotic heart disease of native coronary artery without angina pectoris: Secondary | ICD-10-CM | POA: Diagnosis not present

## 2022-04-22 DIAGNOSIS — I5022 Chronic systolic (congestive) heart failure: Secondary | ICD-10-CM

## 2022-04-22 DIAGNOSIS — Z7902 Long term (current) use of antithrombotics/antiplatelets: Secondary | ICD-10-CM | POA: Insufficient documentation

## 2022-04-22 NOTE — Patient Instructions (Signed)
It was a pleasure seeing you today!  MEDICATIONS: -We are changing your medications today -Stop spironolactone.  -Call if you have questions about your medications.  NEXT APPOINTMENT: Return to clinic in 5 weeks with Dr. Aundra Dubin.  In general, to take care of your heart failure: -Limit your fluid intake to 2 Liters (half-gallon) per day.   -Limit your salt intake to ideally 2-3 grams (2000-3000 mg) per day. -Weigh yourself daily and record, and bring that "weight diary" to your next appointment.  (Weight gain of 2-3 pounds in 1 day typically means fluid weight.) -The medications for your heart are to help your heart and help you live longer.   -Please contact us before stopping any of your heart medications.  Call the clinic at [phone] with questions or to reschedule future appointments.

## 2022-05-08 ENCOUNTER — Ambulatory Visit (HOSPITAL_BASED_OUTPATIENT_CLINIC_OR_DEPARTMENT_OTHER): Payer: Medicare HMO

## 2022-05-08 ENCOUNTER — Ambulatory Visit (HOSPITAL_COMMUNITY): Payer: Medicare HMO | Attending: Cardiovascular Disease | Admitting: Cardiovascular Disease

## 2022-05-08 ENCOUNTER — Encounter: Payer: Self-pay | Admitting: Cardiovascular Disease

## 2022-05-08 VITALS — BP 122/90 | HR 79 | Ht 69.0 in | Wt 130.0 lb

## 2022-05-08 DIAGNOSIS — I340 Nonrheumatic mitral (valve) insufficiency: Secondary | ICD-10-CM | POA: Insufficient documentation

## 2022-05-08 LAB — ECHOCARDIOGRAM COMPLETE
Area-P 1/2: 4.65 cm2
MV M vel: 4.9 m/s
MV Peak grad: 95.9 mmHg
Radius: 0.6 cm
S' Lateral: 4.7 cm

## 2022-05-08 NOTE — Patient Instructions (Signed)
Medication Instructions:  Your physician recommends that you continue on your current medications as directed. Please refer to the Current Medication list given to you today.  *If you need a refill on your cardiac medications before your next appointment, please call your pharmacy*   Lab Work: NONE If you have labs (blood work) drawn today and your tests are completely normal, you will receive your results only by: [place] (if you have MyChart) OR A paper copy in the mail If you have any lab test that is abnormal or we need to change your treatment, we will call you to review the results.   Testing/Procedures: NONE   Follow-Up: At [facility], you and your health needs are our priority.  As part of our continuing mission to provide you with exceptional heart care, we have created designated Provider Care Teams.  These Care Teams include your primary Cardiologist (physician) and Advanced Practice Providers (APPs -  Physician Assistants and Nurse Practitioners) who all work together to provide you with the care you need, when you need it.  We recommend signing up for the patient portal called "MyChart".  Sign up information is provided on this After Visit Summary.  MyChart is used to connect with patients for Virtual Visits (Telemedicine).  Patients are able to view lab/test results, encounter notes, upcoming appointments, etc.  Non-urgent messages can be sent to your provider as well.   To learn more about what you can do with MyChart, go to [email or web address].    Your next appointment:   As Needed  The format for your next appointment:   In Person  Provider:   Sherren Mocha, MD    Important Information About Sugar

## 2022-05-08 NOTE — Progress Notes (Signed)
Cardiology Office Note:    Date:  05/19/2022   ID:  [name], DOB May 23, 1941, MRN [id number]  PCP:  Lillard Anes, [place] Prov[facility] Cardiologist:  Mertie Moores, MD     Referring MD: Lillard Anes,*   Chief Complaint  Patient presents with   Mitral Regurgitation   History of Present Illness:    [name] is a 81 y.o. male presenting for follow-up of mitral regurgitation.  The patient was initially seen during his hospitalization from July 15 through January 28, 2022.  His history is pertinent for coronary artery disease status post remote multivessel CABG in 2007.  He has hypertension, type 2 diabetes, and chronic combined systolic and diastolic heart failure.  He was admitted with new onset congestive heart failure in July of this year.  LVEF was severely depressed at less than 20%.  He was felt to have moderate to severe mitral regurgitation and severe pulmonary hypertension at that time.  Cardiac catheterization showed severe multivessel CAD, total occlusion of the SVG to diagonal and SVG to circumflex, and patency of the LIMA to LAD and saphenous vein graft RCA.  There were no targets for intervention.  Mean PA pressure was 30 mmHg.  Transesophageal echo showed severe LV dysfunction with LVEF less than 20%, mitral annular dilatation with severe secondary mitral regurgitation.  At the time of initial evaluation, titration of GDMT was recommended.  He returns today for follow-up evaluation.  Follow-up echocardiogram shows modest improvement of LV function with LVEF 25 to 30%, moderately severe mitral regurgitation, and normal estimated PA pressure.  The patient is followed up with Dr. Aundra Dubin in the advanced heart failure clinic.  He is here with his daughter today.  States that his breathing is improved.  He really is not limited at all by shortness of breath.  He denies orthopnea or PND.  He had chest pain.  His main complaints are gait  instability, weakness, and generalized fatigue.  Past Medical History:  Diagnosis Date   Acute exacerbation of CHF (congestive heart failure) ([place]) 09/29/2011   Atherosclerotic heart disease of native coronary artery without angina pectoris 02/12/2010   Qualifier: Diagnosis of  By: Marland Mcalpine     Benign prostatic hyperplasia with lower urinary tract symptoms 12/04/2019   CAD (coronary artery disease)    Hx of    Cellulitis 03/19/2012   Chronic obstructive pulmonary disease ([place]) 12/04/2019   Diverticulosis of colon (without mention of hemorrhage) 2011   Colonoscopy    Hiatal hernia 2011   EGD   Hypertension    Hypertensive heart disease without heart failure 12/04/2019   ITP (idiopathic thrombocytopenic purpura)    He has been diagnosed with   Pancytopenia    Thrombocytopenia, unspecified ([place]) 01/24/2013   TRANSAMINASES, SERUM, ELEVATED 02/12/2010   Qualifier: Diagnosis of  By: Marland Mcalpine     Type 2 diabetes mellitus with diabetic peripheral angiopathy without gangrene ([place]) 12/04/2019    Past Surgical History:  Procedure Laterality Date   CARDIAC CATHETERIZATION     His last heart catheterization in May of 2009 reveals a patent LIMA   CORONARY ANGIOPLASTY     Successful percutaneous transluminal coronary angioplasty of the left circumflex obuse marginal vessel   CORONARY ANGIOPLASTY WITH STENT PLACEMENT     CORONARY ARTERY BYPASS GRAFT     x4 with a left internal mammary artery anastomosis to the left anterior descending coronary artery    CORONARY STENT  PLACEMENT     successful percutaneous transluminal coronary angioplasty and stenting of the left main coronary artery   HERNIA REPAIR     RIGHT/LEFT HEART CATH AND CORONARY/GRAFT ANGIOGRAPHY N/A 01/26/2022   Procedure: RIGHT/LEFT HEART CATH AND CORONARY/GRAFT ANGIOGRAPHY;  Surgeon: Troy Sine, MD;  Location: [facility];  Service: Cardiovascular;  Laterality: N/A;   SAPHENOUS VEIN GRAFT RESECTION      graft to the first obtuse marginal, a saphenous vein graft to the first diagonal coronary artery  and a saphenous vein graft to the distal right coronary artery   TEE WITHOUT CARDIOVERSION N/A 01/28/2022   Procedure: TRANSESOPHAGEAL ECHOCARDIOGRAM (TEE);  Surgeon: Berniece Salines, DO;  Location: [facility];  Service: Cardiovascular;  Laterality: N/A;   US ECHOCARDIOGRAPHY  02-08-2007   Est. EF 40-45%    Current Medications: Current Meds  Medication Sig   cholecalciferol (VITAMIN D3) 25 MCG (1000 UT) tablet Take 5,000 Units by mouth daily.   clopidogrel (PLAVIX) 75 MG tablet Take 1 tablet (75 mg total) by mouth daily.   ezetimibe (ZETIA) 10 MG tablet TAKE 1 TABLET BY MOUTH DAILY   Insulin Pen Needle (BD PEN NEEDLE NANO U/F) 32G X 4 MM MISC 1 each by Does not apply route daily.   Lancets MISC 1 each by Does not apply route daily as needed. Please provide lancets that fit patients lancet device   megestrol (MEGACE) 40 MG tablet Take 20 mg by mouth daily.   metoprolol succinate (TOPROL-XL) 25 MG 24 hr tablet Take 0.5 tablets (12.5 mg total) by mouth daily.   Multiple Vitamin (MULTIVITAMIN) tablet Take 1 tablet by mouth daily.   ONETOUCH ULTRA test strip AS DIRECTED   [DISCONTINUED] metFORMIN (GLUCOPHAGE) 500 MG tablet Take 500 mg by mouth in the morning, at noon, in the evening, and at bedtime.     Allergies:   Lipitor [atorvastatin calcium], Crestor [rosuvastatin calcium], and Zocor [simvastatin]   Social History   Socioeconomic History   Marital status: Married    Spouse name: [name]   Number of children: 3   Years of education: Not on file   Highest education level: High school graduate  Occupational History   Occupation: Retired   Tobacco Use   Smoking status: Never   Smokeless tobacco: Never  Vaping Use   Vaping Use: Never used  Substance and Sexual Activity   Alcohol use: No   Drug use: No   Sexual activity: Not Currently  Other Topics Concern   Not on file  Social  History Narrative   Daily caffeine use    Social Determinants of Health   Financial Resource Strain: Low Risk  (01/26/2022)   Overall Financial Resource Strain (CARDIA)    Difficulty of Paying Living Expenses: Not hard at all  Food Insecurity: No Food Insecurity (01/26/2022)   Hunger Vital Sign    Worried About Running Out of Food in the Last Year: Never true    [place] in the Last Year: Never true  Transportation Needs: No Transportation Needs (01/26/2022)   PRAPARE - Hydrologist (Medical): No    Lack of Transportation (Non-Medical): No  Physical Activity: Not on file  Stress: Not on file  Social Connections: Not on file     Family History: The patient's family history includes Cerebrovascular Accident in his mother. There is no history of Colon cancer.  ROS:   Please see the history of present illness.    All  other systems reviewed and are negative.  EKGs/Labs/Other Studies Reviewed:    The following studies were reviewed today: Cardiac Cath 01/26/2022:   1st Diag lesion is 90% stenosed.   Prox RCA to Mid RCA lesion is 100% stenosed.   Origin to Prox Graft lesion is 100% stenosed.   Prox Cx to Mid Cx lesion is 90% stenosed.   2nd Mrg lesion is 90% stenosed.   Lat 2nd Mrg lesion is 95% stenosed.   Origin to Prox Graft lesion is 100% stenosed.   Severe native coronary artery disease with significant coronary calcification.  The LAD is totally occluded after the first diagonal vessel and the first diagonal vessel has 90% ostial stenosis.   Left circumflex vessel has a 90% focal proximal stenosis and 90% stenosis in the marginal branch with distal 95% branch stenosis.   Right coronary artery is totally occluded proximally.   The LIMA graft supplying the LAD is widely patent.   The vein graft supplying the distal RCA is patent.  There is irregularity of the distal PD/PLA vasculature.   The vein graft which had supplied the diagonal  vessel is totally occluded. (Old)   The vein graft had supplied the circumflex vessel is totally occluded. (Old)   Mildly elevated right heart pressures with mild pulmonary venous hypertension and PA pressure at 30 mmHg.   RECOMMENDATION: Patient will be undergoing TEE for further evaluation of mitral valve and valvular apparatus with concern for possible torn chordae on the transthoracic echo.  We will have colleagues review angiographic images with high-grade circumflex stenoses.    TEE 01/28/2022: 1. Left ventricular ejection fraction, by estimation, is <20%. The left  ventricle has severely decreased function. The left ventricle demonstrates  global hypokinesis.   2. The mitral valve and the subvalvular apparatus appears in to be  intact. There is a noted highly mobile echogenic mass which appears to be  likely at the level of the papillary muscle. The mitral valve does not  coapt well which is consisitent with  mitral annulus dilatation. Secondary/atrial functional moderate to severe  mitral regurgitation. No evidence of mitral stenosis.   3. Right ventricular systolic function is moderately reduced. The right  ventricular size is normal.   4. No left atrial/left atrial appendage thrombus was detected.   5. Tricuspid valve regurgitation is mild to moderate.   6. The aortic valve is tricuspid. Aortic valve regurgitation is mild.  Aortic valve sclerosis is present, with no evidence of aortic valve  stenosis.   EKG:  EKG is not ordered today.    Recent Labs: 01/24/2022: TSH 2.684 01/29/2022: Magnesium 2.0 03/30/2022: B Natriuretic Peptide 1,014.2 05/13/2022: ALT 10; BUN 16; Creatinine, Ser 0.84; Hemoglobin 11.9; Platelets 131; Potassium 4.3; Sodium 137  Recent Lipid Panel    Component Value Date/Time   CHOL 157 05/13/2022 0823   TRIG 75 05/13/2022 0823   HDL 56 05/13/2022 0823   CHOLHDL 2.8 05/13/2022 0823   CHOLHDL 2.7 05/11/2016 1002   VLDL 18 05/11/2016 1002   LDLCALC 87  05/13/2022 0823     Risk Assessment/Calculations:           Physical Exam:    VS:  BP (!) 122/90 (BP Location: Right Arm, Patient Position: Sitting, Cuff Size: Normal)   Pulse 79   Ht '5\' 9"'$  (1.753 m)   Wt 130 lb (59 kg)   SpO2 99%   BMI 19.20 kg/m     Wt Readings from Last 3 Encounters:  05/13/22 134  lb 6.4 oz (61 kg)  05/08/22 130 lb (59 kg)  04/22/22 129 lb 9.6 oz (58.8 kg)     GEN:  Eldelry male in no acute distress HEENT: Normal NECK: No JVD; No carotid bruits LYMPHATICS: No lymphadenopathy CARDIAC: RRR, distant heart sounds, 2/6 holosystolic murmur at the apex RESPIRATORY:  Clear to auscultation without rales, wheezing or rhonchi  ABDOMEN: Soft, non-tender, non-distended MUSCULOSKELETAL:  No edema; No deformity  SKIN: Warm and dry NEUROLOGIC:  Alert and oriented x 3 PSYCHIATRIC:  Normal affect   ASSESSMENT:    1. Nonrheumatic mitral valve regurgitation    PLAN:    In order of problems listed above:  81 yo male with severe secondary mitral regurgitation in the setting of severe LV dysfunction and chronic combined systolic and diastolic heart failure.  The patient's primary symptoms are related to his multiple medical comorbidities with weakness and gait instability as his primary issues are present.  I do not think he would really benefit from mitral valve intervention at this point.  He seems to have improved on medical therapy with resolution of pulmonary hypertension, very modest improvement of LV systolic dysfunction, and stability of moderately severe MR.  Recommend continued follow-up in the advanced heart failure clinic.  I would be happy to see him back in the future if his clinical symptoms worsen or if there is a significant change in his mitral regurgitation.           Medication Adjustments/Labs and Tests Ordered: Current medicines are reviewed at length with the patient today.  Concerns regarding medicines are outlined above.  No orders of the  defined types were placed in this encounter.  No orders of the defined types were placed in this encounter.   Patient Instructions  Medication Instructions:  Your physician recommends that you continue on your current medications as directed. Please refer to the Current Medication list given to you today.  *If you need a refill on your cardiac medications before your next appointment, please call your pharmacy*   Lab Work: NONE If you have labs (blood work) drawn today and your tests are completely normal, you will receive your results only by: [place] (if you have MyChart) OR A paper copy in the mail If you have any lab test that is abnormal or we need to change your treatment, we will call you to review the results.   Testing/Procedures: NONE   Follow-Up: At [facility], you and your health needs are our priority.  As part of our continuing mission to provide you with exceptional heart care, we have created designated Provider Care Teams.  These Care Teams include your primary Cardiologist (physician) and Advanced Practice Providers (APPs -  Physician Assistants and Nurse Practitioners) who all work together to provide you with the care you need, when you need it.  We recommend signing up for the patient portal called "MyChart".  Sign up information is provided on this After Visit Summary.  MyChart is used to connect with patients for Virtual Visits (Telemedicine).  Patients are able to view lab/test results, encounter notes, upcoming appointments, etc.  Non-urgent messages can be sent to your provider as well.   To learn more about what you can do with MyChart, go to [email or web address].    Your next appointment:   As Needed  The format for your next appointment:   In Person  Provider:   Sherren Mocha, MD    Important Information About Sugar  Signed, Sherren Mocha, MD  05/19/2022 10:10 PM    [place]

## 2022-05-11 ENCOUNTER — Other Ambulatory Visit: Payer: Self-pay

## 2022-05-11 MED ORDER — METFORMIN HCL 500 MG PO TABS: 500.0000 mg | ORAL_TABLET | Freq: Three times a day (TID) | ORAL | 0 refills | Status: DC

## 2022-05-12 NOTE — Assessment & Plan Note (Addendum)
Continue zetia. Intolerant to statins.  Continue to work on eating a healthy diet and exercise.  Labs drawn today.

## 2022-05-12 NOTE — Assessment & Plan Note (Signed)
The current medical regimen is effective;  continue present plan and medications.  

## 2022-05-12 NOTE — Assessment & Plan Note (Addendum)
Control: good Recommend check sugars fasting daily. Recommend check feet daily. Recommend annual eye exams. Medicines: continue metformin 500 mg three times a day.  Continue to work on eating a healthy diet and exercise.  Labs drawn today.

## 2022-05-12 NOTE — Assessment & Plan Note (Addendum)
Management per specialist. [place].

## 2022-05-12 NOTE — Assessment & Plan Note (Deleted)
The current medical regimen is effective;  continue present plan and medications.  

## 2022-05-12 NOTE — Progress Notes (Signed)
Subjective:  Patient ID: [name], male    DOB: 09/05/1940  Age: 81 y.o. MRN: [id number]  Chief Complaint  Patient presents with   Diabetes   Hypertension   Hyperlipidemia    HPI Diabetes: dxd around age 75 yo.  Complications: polyneuropathy Checks sugars daily. Glucose log: 113-134. No hypoglycemia. Most recent A1C: 6.4% Current medications: Metformin 500 mg three times a day  Last Eye Exam: 07/03/2020 Foot checks:  Hyperlipidemia: Current medications: Zetia 10 mg daily. Intolerant to statins.   Hypertensive heart disease/CHF: history of MI. CABG in 2003. Had a stent place in the interem. In July 2023 and had chest pain/shortness of breath at 01/2022. EKG: abnormal. Taken by private vehicle.  Did not have ami. Found to have severe mitral regurgitation. The hope was to have surgery to repair when he became stronger, but recently saw Dr Burt Knack and the risk is too high for surgery and his symptoms have significantly improved (shortness of breath/chest pain.) Intolerant to some of the heart medicines (jardiance and spironolactone) because his bp dropped too low. He is officially following with Dr. Aundra Dubin at the heart failure clinic. His main complaint is low energy. Sbp >100. Checks sugars daily. When on spironolactone, SBP would drop to below 90. HR: 70-80s. 01/2022:  LHC: Cardiac Cath 01/26/2022:   1st Diag lesion is 90% stenosed.   Prox RCA to Mid RCA lesion is 100% stenosed.   Origin to Prox Graft lesion is 100% stenosed.   Prox Cx to Mid Cx lesion is 90% stenosed.   2nd Mrg lesion is 90% stenosed.   Lat 2nd Mrg lesion is 95% stenosed.   Origin to Prox Graft lesion is 100% stenosed.   Severe native coronary artery disease with significant coronary calcification.  The LAD is totally occluded after the first diagonal vessel and the first diagonal vessel has 90% ostial stenosis.   Left circumflex vessel has a 90% focal proximal stenosis and 90% stenosis in the marginal  branch with distal 95% branch stenosis.   Right coronary artery is totally occluded proximally.   The LIMA graft supplying the LAD is widely patent.   The vein graft supplying the distal RCA is patent.  There is irregularity of the distal PD/PLA vasculature.   The vein graft which had supplied the diagonal vessel is totally occluded. (Old)   The vein graft had supplied the circumflex vessel is totally occluded. (Old)   Mildly elevated right heart pressures with mild pulmonary venous hypertension and PA pressure at 30 mmHg.   RECOMMENDATION: Patient will be undergoing TEE for further evaluation of mitral valve and valvular apparatus with concern for possible torn chordae on the transthoracic echo.  We will have colleagues review angiographic images with high-grade circumflex stenoses.    TEE 01/28/2022: 1. Left ventricular ejection fraction, by estimation, is <20%. The left  ventricle has severely decreased function. The left ventricle demonstrates  global hypokinesis.   2. The mitral valve and the subvalvular apparatus appears in to be  intact. There is a noted highly mobile echogenic mass which appears to be  likely at the level of the papillary muscle. The mitral valve does not  coapt well which is consisitent with  mitral annulus dilatation. Secondary/atrial functional moderate to severe  mitral regurgitation. No evidence of mitral stenosis.   3. Right ventricular systolic function is moderately reduced. The right  ventricular size is normal.   4. No left atrial/left atrial appendage thrombus was detected.   5. Tricuspid  valve regurgitation is mild to moderate.   6. The aortic valve is tricuspid. Aortic valve regurgitation is mild.  Aortic valve sclerosis is present, with no evidence of aortic valve  stenosis.  Complications: Diabetes, hyperlipidemia Current medications: Metoprolol 25 mg 1/2 daily. Plavix, zetia. Intolerant to statins.   [place] 3 times a couple weekends ago. If  does not eat well, more prone to falls. Uses a cane. Low bp.   Diet: healthy. High protein. On megace.   Current Outpatient Medications on File Prior to Visit  Medication Sig Dispense Refill   cholecalciferol (VITAMIN D3) 25 MCG (1000 UT) tablet Take 5,000 Units by mouth daily.     clopidogrel (PLAVIX) 75 MG tablet Take 1 tablet (75 mg total) by mouth daily. 90 tablet 3   ezetimibe (ZETIA) 10 MG tablet TAKE 1 TABLET BY MOUTH DAILY 90 tablet 1   Insulin Pen Needle (BD PEN NEEDLE NANO U/F) 32G X 4 MM MISC 1 each by Does not apply route daily. 100 each 3   Lancets MISC 1 each by Does not apply route daily as needed. Please provide lancets that fit patients lancet device 100 each 4   megestrol (MEGACE) 40 MG tablet Take 20 mg by mouth daily.     metoprolol succinate (TOPROL-XL) 25 MG 24 hr tablet Take 0.5 tablets (12.5 mg total) by mouth daily. 30 tablet 2   Multiple Vitamin (MULTIVITAMIN) tablet Take 1 tablet by mouth daily.     ONETOUCH ULTRA test strip AS DIRECTED 100 each 4   nitroGLYCERIN (NITROSTAT) 0.4 MG SL tablet Place 1 tablet (0.4 mg total) under the tongue every 5 (five) minutes as needed for chest pain. 90 tablet 3   No current facility-administered medications on file prior to visit.   Past Medical History:  Diagnosis Date   Acute exacerbation of CHF (congestive heart failure) ([place]) 09/29/2011   Atherosclerotic heart disease of native coronary artery without angina pectoris 02/12/2010   Qualifier: Diagnosis of  By: Marland Mcalpine     Benign prostatic hyperplasia with lower urinary tract symptoms 12/04/2019   CAD (coronary artery disease)    Hx of    Cellulitis 03/19/2012   Chronic obstructive pulmonary disease ([place]) 12/04/2019   Diverticulosis of colon (without mention of hemorrhage) 2011   Colonoscopy    Hiatal hernia 2011   EGD   Hypertension    Hypertensive heart disease without heart failure 12/04/2019   ITP (idiopathic thrombocytopenic purpura)    He has been  diagnosed with   Pancytopenia    Thrombocytopenia, unspecified ([place]) 01/24/2013   TRANSAMINASES, SERUM, ELEVATED 02/12/2010   Qualifier: Diagnosis of  By: Marland Mcalpine     Type 2 diabetes mellitus with diabetic peripheral angiopathy without gangrene ([place]) 12/04/2019   Past Surgical History:  Procedure Laterality Date   CARDIAC CATHETERIZATION     His last heart catheterization in May of 2009 reveals a patent LIMA   CORONARY ANGIOPLASTY     Successful percutaneous transluminal coronary angioplasty of the left circumflex obuse marginal vessel   CORONARY ANGIOPLASTY WITH STENT PLACEMENT     CORONARY ARTERY BYPASS GRAFT     x4 with a left internal mammary artery anastomosis to the left anterior descending coronary artery    CORONARY STENT PLACEMENT     successful percutaneous transluminal coronary angioplasty and stenting of the left main coronary artery   HERNIA REPAIR     RIGHT/LEFT HEART CATH AND CORONARY/GRAFT ANGIOGRAPHY N/A 01/26/2022   Procedure: RIGHT/LEFT HEART  CATH AND CORONARY/GRAFT ANGIOGRAPHY;  Surgeon: Troy Sine, MD;  Location: [facility];  Service: Cardiovascular;  Laterality: N/A;   SAPHENOUS VEIN GRAFT RESECTION     graft to the first obtuse marginal, a saphenous vein graft to the first diagonal coronary artery  and a saphenous vein graft to the distal right coronary artery   TEE WITHOUT CARDIOVERSION N/A 01/28/2022   Procedure: TRANSESOPHAGEAL ECHOCARDIOGRAM (TEE);  Surgeon: Berniece Salines, DO;  Location: [facility];  Service: Cardiovascular;  Laterality: N/A;   US ECHOCARDIOGRAPHY  02-08-2007   Est. EF 40-45%    Family History  Problem Relation Age of Onset   Cerebrovascular Accident Mother    Colon cancer Neg Hx    Social History   Socioeconomic History   Marital status: Married    Spouse name: Doris   Number of children: 3   Years of education: Not on file   Highest education level: High school graduate  Occupational History   Occupation:  Retired   Tobacco Use   Smoking status: Never   Smokeless tobacco: Never  Vaping Use   Vaping Use: Never used  Substance and Sexual Activity   Alcohol use: No   Drug use: No   Sexual activity: Not Currently  Other Topics Concern   Not on file  Social History Narrative   Daily caffeine use    Social Determinants of Health   Financial Resource Strain: Low Risk  (01/26/2022)   Overall Financial Resource Strain (CARDIA)    Difficulty of Paying Living Expenses: Not hard at all  Food Insecurity: No Food Insecurity (01/26/2022)   Hunger Vital Sign    Worried About Running Out of Food in the Last Year: Never true    [place] in the Last Year: Never true  Transportation Needs: No Transportation Needs (01/26/2022)   PRAPARE - Hydrologist (Medical): No    Lack of Transportation (Non-Medical): No  Physical Activity: Not on file  Stress: Not on file  Social Connections: Not on file    Review of Systems  Constitutional:  Positive for fatigue and unexpected weight change. Negative for chills and fever.  HENT:  Negative for congestion, ear pain, sinus pain and sore throat.   Respiratory:  Negative for cough and shortness of breath.   Cardiovascular:  Negative for chest pain and palpitations.  Gastrointestinal:  Negative for abdominal pain, blood in stool, constipation, diarrhea, nausea and vomiting.  Endocrine: Negative for polydipsia.  Genitourinary:  Negative for dysuria.  Musculoskeletal:  Negative for back pain.  Neurological:  Positive for dizziness. Negative for headaches.  Psychiatric/Behavioral:  Negative for dysphoric mood. The patient is not nervous/anxious.      Objective:  BP 110/70 (BP Location: Left Arm, Patient Position: Sitting, Cuff Size: Normal)   Pulse 79   Temp (!) 97.4 F (36.3 C) (Temporal)   Ht '5\' 9"'$  (1.753 m)   Wt 134 lb 6.4 oz (61 kg)   SpO2 99%   BMI 19.85 kg/m      05/13/2022    7:32 AM 05/08/2022   11:05 AM  04/22/2022   12:40 PM  BP/Weight  Systolic BP 741 287 867  Diastolic BP 70 90 64  Wt. (Lbs) 134.4 130   BMI 19.85 kg/m2 19.2 kg/m2     Physical Exam Constitutional:      Appearance: Normal appearance.  Cardiovascular:     Rate and Rhythm: Normal rate and regular rhythm.  Pulses: Normal pulses.     Heart sounds: Normal heart sounds.  Pulmonary:     Effort: Pulmonary effort is normal.     Breath sounds: Normal breath sounds.  Abdominal:     General: Bowel sounds are normal.     Palpations: Abdomen is soft. There is no mass.     Tenderness: There is no abdominal tenderness.  Neurological:     Mental Status: He is alert.  Psychiatric:        Mood and Affect: Mood normal.        Behavior: Behavior normal.        Thought Content: Thought content normal.     Diabetic Foot Exam - Simple   Simple Foot Form  05/13/2022 11:07 PM  Visual Inspection No deformities, no ulcerations, no other skin breakdown bilaterally: Yes Sensation Testing Intact to touch and monofilament testing bilaterally: Yes Pulse Check Posterior Tibialis and Dorsalis pulse intact bilaterally: Yes Comments      Lab Results  Component Value Date   WBC 7.0 05/13/2022   HGB 11.9 (L) 05/13/2022   HCT 34.3 (L) 05/13/2022   PLT 131 (L) 05/13/2022   GLUCOSE 112 (H) 05/13/2022   CHOL 157 05/13/2022   TRIG 75 05/13/2022   HDL 56 05/13/2022   LDLCALC 87 05/13/2022   ALT 10 05/13/2022   AST 17 05/13/2022   NA 137 05/13/2022   K 4.3 05/13/2022   CL 100 05/13/2022   CREATININE 0.84 05/13/2022   BUN 16 05/13/2022   CO2 20 05/13/2022   TSH 2.684 01/24/2022   INR 1.3 (H) 03/24/2011   HGBA1C 6.1 (H) 05/13/2022   MICROALBUR 10 05/13/2021      Assessment & Plan:   Problem List Items Addressed This Visit       Cardiovascular and Mediastinum   Coronary artery disease of native artery of native heart with stable angina pectoris ([place])    Management per specialist. CONGESTIVE HEART FAILURE.        Relevant Orders   Comprehensive metabolic panel (Completed)   CBC with Differential/Platelet (Completed)   Hypertensive heart disease with CHF ([place])    Severe. EF 20%. No changes to medicines. On metoprolol. Continue to work on eating a healthy diet and exercise.  Labs drawn today.       Type 2 diabetes mellitus with diabetic peripheral angiopathy without gangrene ([place]) - Primary    Control: good Recommend check sugars fasting daily. Recommend check feet daily. Recommend annual eye exams. Medicines: continue metformin 500 mg three times a day.  Continue to work on eating a healthy diet and exercise.  Labs drawn today.        Relevant Medications   metFORMIN (GLUCOPHAGE) 500 MG tablet   Other Relevant Orders   Hemoglobin A1c (Completed)   Cardiovascular Risk Assessment (Completed)     Respiratory   Chronic obstructive pulmonary disease ([place])    On no inhalers.        Endocrine   Diabetic polyneuropathy ([place])    The current medical regimen is effective;  continue present plan and medications.       Relevant Medications   metFORMIN (GLUCOPHAGE) 500 MG tablet     Musculoskeletal and Integument   Statin myopathy    Intolerant to statins.         Genitourinary   Benign prostatic hyperplasia with lower urinary tract symptoms     Hematopoietic and Hemostatic   Idiopathic thrombocytopenic purpura (HCC)    Check cbc. Continue  to monitor every 3 months.         Other   Mixed hyperlipidemia    Continue zetia. Intolerant to statins.  Continue to work on eating a healthy diet and exercise.  Labs drawn today.       Relevant Orders   Lipid panel (Completed)   Other Visit Diagnoses     Encounter for immunization       Relevant Orders   Pfizer Fall 2023 Covid-19 Vaccine 71yr and older (Completed)     .  Meds ordered this encounter  Medications   metFORMIN (GLUCOPHAGE) 500 MG tablet    Sig: Take 1 tablet (500 mg total) by mouth 4 (four) times daily.     Dispense:  360 tablet    Refill:  1    Orders Placed This Encounter  Procedures   Pfizer Fall 2023 Covid-19 Vaccine 173yrand older   Comprehensive metabolic panel   Hemoglobin A1c   Lipid panel   CBC with Differential/Platelet   Cardiovascular Risk Assessment     Follow-up: Return in about 3 months (around 08/13/2022) for chronic fasting.  I,Kendarrius Tanzi,acting as a scEducation administratoror KiRochel BromeMD.,have documented all relevant documentation on the behalf of KiRochel BromeMD,as directed by  KiRochel BromeMD while in the presence of KiRochel BromeMD.   An After Visit Summary was printed and given to the patient.  KiRochel BromeMD [facility] ([phone]

## 2022-05-12 NOTE — Assessment & Plan Note (Addendum)
Severe. EF 20%. No changes to medicines. On metoprolol. Continue to work on eating a healthy diet and exercise.  Labs drawn today.

## 2022-05-12 NOTE — Assessment & Plan Note (Addendum)
On no inhalers.

## 2022-05-13 ENCOUNTER — Ambulatory Visit (INDEPENDENT_AMBULATORY_CARE_PROVIDER_SITE_OTHER): Payer: Medicare HMO | Admitting: Family Medicine

## 2022-05-13 ENCOUNTER — Encounter: Payer: Self-pay | Admitting: Family Medicine

## 2022-05-13 VITALS — BP 110/70 | HR 79 | Temp 97.4°F | Ht 69.0 in | Wt 134.4 lb

## 2022-05-13 DIAGNOSIS — E1151 Type 2 diabetes mellitus with diabetic peripheral angiopathy without gangrene: Secondary | ICD-10-CM

## 2022-05-13 DIAGNOSIS — E782 Mixed hyperlipidemia: Secondary | ICD-10-CM | POA: Diagnosis not present

## 2022-05-13 DIAGNOSIS — I25118 Atherosclerotic heart disease of native coronary artery with other forms of angina pectoris: Secondary | ICD-10-CM | POA: Diagnosis not present

## 2022-05-13 DIAGNOSIS — G720 Drug-induced myopathy: Secondary | ICD-10-CM | POA: Diagnosis not present

## 2022-05-13 DIAGNOSIS — I5089 Other heart failure: Secondary | ICD-10-CM

## 2022-05-13 DIAGNOSIS — Z23 Encounter for immunization: Secondary | ICD-10-CM

## 2022-05-13 DIAGNOSIS — D693 Immune thrombocytopenic purpura: Secondary | ICD-10-CM | POA: Diagnosis not present

## 2022-05-13 DIAGNOSIS — E1142 Type 2 diabetes mellitus with diabetic polyneuropathy: Secondary | ICD-10-CM

## 2022-05-13 DIAGNOSIS — T466X5A Adverse effect of antihyperlipidemic and antiarteriosclerotic drugs, initial encounter: Secondary | ICD-10-CM

## 2022-05-13 DIAGNOSIS — N401 Enlarged prostate with lower urinary tract symptoms: Secondary | ICD-10-CM

## 2022-05-13 DIAGNOSIS — D649 Anemia, unspecified: Secondary | ICD-10-CM | POA: Diagnosis not present

## 2022-05-13 DIAGNOSIS — I10 Essential (primary) hypertension: Secondary | ICD-10-CM

## 2022-05-13 DIAGNOSIS — I110 Hypertensive heart disease with heart failure: Secondary | ICD-10-CM | POA: Diagnosis not present

## 2022-05-13 DIAGNOSIS — J410 Simple chronic bronchitis: Secondary | ICD-10-CM

## 2022-05-13 MED ORDER — METFORMIN HCL 500 MG PO TABS: 500.0000 mg | ORAL_TABLET | Freq: Four times a day (QID) | ORAL | 1 refills | Status: DC

## 2022-05-14 LAB — COMPREHENSIVE METABOLIC PANEL
ALT: 10 IU/L (ref 0–44)
AST: 17 IU/L (ref 0–40)
Albumin/Globulin Ratio: 1.6 (ref 1.2–2.2)
Albumin: 4.1 g/dL (ref 3.7–4.7)
Alkaline Phosphatase: 46 IU/L (ref 44–121)
BUN/Creatinine Ratio: 19 (ref 10–24)
BUN: 16 mg/dL (ref 8–27)
Bilirubin Total: 1.1 mg/dL (ref 0.0–1.2)
CO2: 20 mmol/L (ref 20–29)
Calcium: 9.6 mg/dL (ref 8.6–10.2)
Chloride: 100 mmol/L (ref 96–106)
Creatinine, Ser: 0.84 mg/dL (ref 0.76–1.27)
Globulin, Total: 2.5 g/dL (ref 1.5–4.5)
Glucose: 112 mg/dL — ABNORMAL HIGH (ref 70–99)
Potassium: 4.3 mmol/L (ref 3.5–5.2)
Sodium: 137 mmol/L (ref 134–144)
Total Protein: 6.6 g/dL (ref 6.0–8.5)
eGFR: 87 mL/min/{1.73_m2} (ref >59)

## 2022-05-14 LAB — HEMOGLOBIN A1C
Est. average glucose Bld gHb Est-mCnc: 128 mg/dL
Hgb A1c MFr Bld: 6.1 % — ABNORMAL HIGH (ref 4.8–5.6)

## 2022-05-14 LAB — CBC WITH DIFFERENTIAL/PLATELET
Basophils Absolute: 0 10*3/uL (ref 0.0–0.2)
Basos: 0 %
EOS (ABSOLUTE): 0.2 10*3/uL (ref 0.0–0.4)
Eos: 3 %
Hematocrit: 34.3 % — ABNORMAL LOW (ref 37.5–51.0)
Hemoglobin: 11.9 g/dL — ABNORMAL LOW (ref 13.0–17.7)
Immature Grans (Abs): 0 10*3/uL (ref 0.0–0.1)
Immature Granulocytes: 0 %
Lymphocytes Absolute: 1.1 10*3/uL (ref 0.7–3.1)
Lymphs: 16 %
MCH: 33.3 pg — ABNORMAL HIGH (ref 26.6–33.0)
MCHC: 34.7 g/dL (ref 31.5–35.7)
MCV: 96 fL (ref 79–97)
Monocytes Absolute: 0.5 10*3/uL (ref 0.1–0.9)
Monocytes: 7 %
Neutrophils Absolute: 5.1 10*3/uL (ref 1.4–7.0)
Neutrophils: 74 %
Platelets: 131 10*3/uL — ABNORMAL LOW (ref 150–450)
RBC: 3.57 x10E6/uL — ABNORMAL LOW (ref 4.14–5.80)
RDW: 13.2 % (ref 11.6–15.4)
WBC: 7 10*3/uL (ref 3.4–10.8)

## 2022-05-14 LAB — LIPID PANEL
Chol/HDL Ratio: 2.8 ratio (ref 0.0–5.0)
Cholesterol, Total: 157 mg/dL (ref 100–199)
HDL: 56 mg/dL (ref >39)
LDL Chol Calc (NIH): 87 mg/dL (ref 0–99)
Triglycerides: 75 mg/dL (ref 0–149)
VLDL Cholesterol Cal: 14 mg/dL (ref 5–40)

## 2022-05-14 LAB — CARDIOVASCULAR RISK ASSESSMENT

## 2022-05-14 NOTE — Progress Notes (Signed)
Blood count abnormal.  Anemia.  Platelets also low.  Has been intermittently low in the past.  Recommend add on B12, folate.  Liver function normal.  Kidney function normal.  Cholesterol: Good HBA1C: 6.1.  Diabetes well controlled.

## 2022-05-16 NOTE — Assessment & Plan Note (Signed)
Check cbc. Continue to monitor every 3 months.

## 2022-05-16 NOTE — Assessment & Plan Note (Signed)
Intolerant to statins. 

## 2022-05-19 ENCOUNTER — Encounter: Payer: Self-pay | Admitting: Cardiovascular Disease

## 2022-05-21 ENCOUNTER — Ambulatory Visit: Payer: Medicare HMO | Admitting: Podiatry

## 2022-05-21 ENCOUNTER — Encounter: Payer: Self-pay | Admitting: Podiatry

## 2022-05-21 DIAGNOSIS — B351 Tinea unguium: Secondary | ICD-10-CM

## 2022-05-21 DIAGNOSIS — E1142 Type 2 diabetes mellitus with diabetic polyneuropathy: Secondary | ICD-10-CM

## 2022-05-21 DIAGNOSIS — M79609 Pain in unspecified limb: Secondary | ICD-10-CM

## 2022-05-21 DIAGNOSIS — L84 Corns and callosities: Secondary | ICD-10-CM

## 2022-05-21 DIAGNOSIS — Q828 Other specified congenital malformations of skin: Secondary | ICD-10-CM

## 2022-05-21 NOTE — Progress Notes (Addendum)
  Subjective:  Patient ID: [name], male    DOB: June 05, 1941,  MRN: [id number]  [name] presents to clinic today for at risk foot care with history of diabetic neuropathy and callus(es) b/l lower extremities and painful thick toenails that are difficult to trim. Painful toenails interfere with ambulation. Aggravating factors include wearing enclosed shoe gear. Pain is relieved with periodic professional debridement. Painful calluses are aggravated when weightbearing with and without shoegear. Pain is relieved with periodic professional debridement.  Chief Complaint  Patient presents with   Nail Problem    DFC BG - 109 this morning  A1C - 6.4 PCP - Kristen Cox , last OV was last week    New problem(s): None.   PCP is Lillard Anes, MD , and last visit was April 07, 2022.  Allergies  Allergen Reactions   Lipitor [Atorvastatin Calcium] Other (See Comments)    Causes memory loss   Crestor [Rosuvastatin Calcium] Other (See Comments)    Causes Muscle Pain   Zocor [Simvastatin] Other (See Comments)    Causes muscle pain    Review of Systems: Negative except as noted in the HPI.  Objective: No changes noted in today's physical examination.  [name] is a pleasant 81 y.o. male WD, WN in NAD. AAO x 3.  Vascular Examination: CFT <3 seconds b/l. DP pulses faintly palpable b/l. PT pulses diminished b/l. Digital hair absent. Skin temperature gradient warm to warm b/l. No ischemia or gangrene. No cyanosis or clubbing noted b/l. Varicosities present b/l.   Neurological Examination: Sensation grossly intact b/l with 10 gram monofilament.Vibratory sensation diminished b/l.  Dermatological Examination: Pedal skin warm and supple b/l. Toenails 1-5 b/l thick, discolored, elongated with subungual debris and pain on dorsal palpation.    Porokeratotic lesion(s) submet head 1 left foot, submet head 2 right foot, submet head 3 b/l, and submet head 5 b/l. No  erythema, no edema, no drainage, no fluctuance.  Musculoskeletal Examination: Muscle strength 5/5 to b/l LE. HAV with bunion bilaterally and hammertoes 2-5 b/l.  Radiographs: None  Assessment/Plan: 1. Pain due to onychomycosis of nail   2. Porokeratosis   3. Diabetic polyneuropathy associated with type 2 diabetes mellitus (HCC)     No orders of the defined types were placed in this encounter.   -Consent given for treatment as described below: -Examined patient. -Continue supportive shoe gear daily. -Toenails 1-5 b/l were debrided in length and girth with sterile nail nippers and dremel without iatrogenic bleeding.  -Porokeratotic lesion(s) submet head 1 left foot, submet head 2 right foot, submet head 3 b/l, and submet head 5 b/l pared and enucleated with sterile currette without incident. Total number of lesions debrided=6. -Patient/POA to call should there be question/concern in the interim.   Return in about 3 months (around 08/21/2022).  Marzetta Board, DPM

## 2022-05-25 ENCOUNTER — Encounter: Payer: Self-pay | Admitting: Podiatry

## 2022-05-27 ENCOUNTER — Ambulatory Visit (INDEPENDENT_AMBULATORY_CARE_PROVIDER_SITE_OTHER): Payer: Medicare HMO

## 2022-05-27 DIAGNOSIS — Z23 Encounter for immunization: Secondary | ICD-10-CM | POA: Diagnosis not present

## 2022-05-28 LAB — SPECIMEN STATUS REPORT

## 2022-05-28 LAB — B12 AND FOLATE PANEL
Folate: >20 ng/mL (ref >3.0)
Vitamin B-12: 397 pg/mL (ref 232–1245)

## 2022-05-29 ENCOUNTER — Telehealth (HOSPITAL_COMMUNITY): Payer: Self-pay | Admitting: Pharmacy Technician

## 2022-05-29 ENCOUNTER — Other Ambulatory Visit (HOSPITAL_COMMUNITY): Payer: Self-pay

## 2022-05-29 ENCOUNTER — Ambulatory Visit (HOSPITAL_COMMUNITY)
Admission: RE | Admit: 2022-05-29 | Discharge: 2022-05-29 | Disposition: A | Discharge status: Home / Self Care | Payer: Medicare HMO | Source: Ambulatory Visit | Attending: Cardiology | Admitting: Cardiology

## 2022-05-29 VITALS — Wt 137.0 lb

## 2022-05-29 DIAGNOSIS — Z79899 Other long term (current) drug therapy: Secondary | ICD-10-CM | POA: Diagnosis not present

## 2022-05-29 DIAGNOSIS — I255 Ischemic cardiomyopathy: Secondary | ICD-10-CM | POA: Insufficient documentation

## 2022-05-29 DIAGNOSIS — Z7902 Long term (current) use of antithrombotics/antiplatelets: Secondary | ICD-10-CM | POA: Insufficient documentation

## 2022-05-29 DIAGNOSIS — J449 Chronic obstructive pulmonary disease, unspecified: Secondary | ICD-10-CM | POA: Diagnosis not present

## 2022-05-29 DIAGNOSIS — I951 Orthostatic hypotension: Secondary | ICD-10-CM | POA: Diagnosis not present

## 2022-05-29 DIAGNOSIS — R413 Other amnesia: Secondary | ICD-10-CM | POA: Insufficient documentation

## 2022-05-29 DIAGNOSIS — I110 Hypertensive heart disease with heart failure: Secondary | ICD-10-CM | POA: Diagnosis not present

## 2022-05-29 DIAGNOSIS — R54 Age-related physical debility: Secondary | ICD-10-CM | POA: Insufficient documentation

## 2022-05-29 DIAGNOSIS — I2510 Atherosclerotic heart disease of native coronary artery without angina pectoris: Secondary | ICD-10-CM | POA: Diagnosis not present

## 2022-05-29 DIAGNOSIS — I5022 Chronic systolic (congestive) heart failure: Secondary | ICD-10-CM | POA: Diagnosis not present

## 2022-05-29 DIAGNOSIS — I25810 Atherosclerosis of coronary artery bypass graft(s) without angina pectoris: Secondary | ICD-10-CM | POA: Diagnosis not present

## 2022-05-29 DIAGNOSIS — I340 Nonrheumatic mitral (valve) insufficiency: Secondary | ICD-10-CM | POA: Diagnosis not present

## 2022-05-29 MED ORDER — DIGOXIN 125 MCG PO TABS: 0.1250 mg | ORAL_TABLET | Freq: Every day | ORAL | 3 refills | Status: DC

## 2022-05-29 MED ORDER — VERQUVO 2.5 MG PO TABS: 2.5000 mg | ORAL_TABLET | Freq: Every day | ORAL | 11 refills | Status: DC

## 2022-05-29 NOTE — Patient Instructions (Signed)
Labs done today. We will contact you only if your labs are abnormal.  START Digoxin 0.'125mg'$  (1 tablet) by mouth daily.   START Verquvo 2.'5mg'$  (1 tablet) by mouth daily.   No other medication changes were made. Please continue all current medications as prescribed.  You have been referred to Cardiac Electrophysiology. They will contact you to schedule an appointment.   Your physician recommends that you schedule a follow-up appointment in: 10 days for a lab only appointment, 3 weeks with our Clinic Pharmacist and in 3 months with our NP/PA Clinic.   If you have any questions or concerns before your next appointment please send [place] a message through [place] or call our office at [phone].    TO LEAVE A MESSAGE FOR THE NURSE SELECT OPTION 2, PLEASE LEAVE A MESSAGE INCLUDING: YOUR NAME DATE OF BIRTH CALL BACK NUMBER REASON FOR CALL**this is important as we prioritize the call backs  YOU WILL RECEIVE A CALL BACK THE SAME DAY AS LONG AS YOU CALL BEFORE 4:00 PM   Do the following things EVERYDAY: Weigh yourself in the morning before breakfast. Write it down and keep it in a log. Take your medicines as prescribed Eat low salt foods--Limit salt (sodium) to 2000 mg per day.  Stay as active as you can everyday Limit all fluids for the day to less than 2 liters   At the [facility], you and your health needs are our priority. As part of our continuing mission to provide you with exceptional heart care, we have created designated Provider Care Teams. These Care Teams include your primary Cardiologist (physician) and Advanced Practice Providers (APPs- Physician Assistants and Nurse Practitioners) who all work together to provide you with the care you need, when you need it.   You may see any of the following providers on your designated Care Team at your next follow up: Dr Glori Bickers Dr Haynes Kerns, NP Lyda Jester, [place] Audry Riles, PharmD   Please  be sure to bring in all your medications bottles to every appointment.

## 2022-05-29 NOTE — Telephone Encounter (Signed)
Advanced Heart Failure Patient Advocate Encounter  Prior Authorization for [place] has been approved.    PA#  [id number] Effective dates: 07/13/21 through 07/12/22  Patients co-pay is $250 (30 days, patient met deductible with this fill). Will call patient and confirm affordability.

## 2022-05-29 NOTE — Telephone Encounter (Signed)
Patient Advocate Encounter   Received notification from [place] that prior authorization for Verquvo 2.'5mg'$  is required.   PA submitted on CoverMyMeds Key [id number] Status is pending   Will continue to follow.

## 2022-05-31 NOTE — Progress Notes (Signed)
Primary Care: Dr. Henrene Pastor HF Cardiology: Dr. Aundra Dubin  HPI: Patient referred to HF clinic from transition of care clinic for evaluation of CHF.   81 y.o. male with history of CAD s/p CABG in 2003 with subsequent PCI in 2007, DM II, chronic systolic CHF. He has been followed by Dr. Acie Fredrickson in the Cardiology clinic. Echo in 6/22 showed EF 50-55%, RV okay, RVSP normal, mild to moderate MR  He was admitted on 01/24/22 with progressive weakness and dyspnea. Echo with EF < 20%, RV mildly reduced, RVSP 80 mmHg, moderate to severe MR with concern for torn chord, mild to moderate TR.  [facility] 01/26/22 showed LIMA to LAD patent, patent SVG to distal RCA, occluded SVG to diagonal, SVG to Cx is occluded, RA mean 6, PA 41/23 (30), PCWP mean 21 (v 28), PA sat 58%, Fick CO/CI 4.5/2.6, Thermo CO/CI 3.4/1.9.  No targets for intervention on LHC.  TEE 01/28/22 showed EF < 20%, moderate to severe functional MR, highly mobile echogenic mass appears to be papillary muscle, RV moderately reduced.  He was diuresed with IV lasix. Difficulty tolerating GDMT due to low blood pressures.   He was seen by Dr. Burt Knack for consideration of mTEER.    He was started on cardiac medications, but subsequently, spironolactone, Jardiance, and losartan were all stopped due to hypotension and orthostasis.  Patient feels like the problems really started when he began [name].   He followed up with Dr. Burt Knack, and it was decided not to proceed with mTEER, moderate-severe MR and unclear how much his fatigue would improve with MR treatment.   Echo in 10/23 showed EF 25-30%, moderate LV dilation, normal RV, mod-severe ischemic MR, normal IVC.   He returns for followup of CHF. Still with very poor energy level.  Legs/arms feels weak.  Not short of breath, just very tired.  He is unable to walk up his driveway due to a hill and cannot walk out to his mailbox.  He tried to take spironolactone but developed orthostatic symptoms so stopped it.  Currently,  no lightheadedness with standing.  Still has some trouble with balance but no falls. Weight stable.   Labs (723): LDL 85 Labs (8/23): K 4.3, creatinine 1.19 Labs (11/23): K 4.3, creatinine 0.84, LDL 87  Review of Systems: All systems reviewed and negative except as per HPI.   PMH: 1. BPH 2. COPD 3. HTN 4. H/o ITP 5. Type 2 diabetes 6. Hyperlipidemia 7. CAD: S/p CABG.  - LHC 01/26/22 showed LIMA to LAD patent, patent SVG to distal RCA, occluded SVG to diagonal, SVG to Cx is occluded 8. Chronic systolic CHF: Ischemic cardiomyopathy.  - Echo (7/23): EF < 20%, RV mildly reduced, RVSP 80 mmHg, moderate to severe MR with concern for torn chord, mild to moderate TR. - RHC (7/23):  RA mean 6, PA 41/23 (30), PCWP mean 21 (v 28), PA sat 58%, Fick CO/CI 4.5/2.6, Thermo CO/CI 3.4/1.9 - Echo (10/23): EF 25-30%, moderate LV dilation, normal RV, mod-severe ischemic MR, normal IVC.  9. Mitral regurgitation: Functional.  - TEE 01/28/22 showed EF < 20%, moderate to severe functional MR, highly mobile echogenic mass appears to be papillary muscle, RV moderately reduced.    Current Outpatient Medications  Medication Sig Dispense Refill   cholecalciferol (VITAMIN D3) 25 MCG (1000 UT) tablet Take 5,000 Units by mouth daily.     clopidogrel (PLAVIX) 75 MG tablet Take 1 tablet (75 mg total) by mouth daily. 90 tablet 3   digoxin (LANOXIN) 0.125  MG tablet Take 1 tablet (0.125 mg total) by mouth daily. 90 tablet 3   ezetimibe (ZETIA) 10 MG tablet TAKE 1 TABLET BY MOUTH DAILY 90 tablet 1   Insulin Pen Needle (BD PEN NEEDLE NANO U/F) 32G X 4 MM MISC 1 each by Does not apply route daily. 100 each 3   Lancets MISC 1 each by Does not apply route daily as needed. Please provide lancets that fit patients lancet device 100 each 4   megestrol (MEGACE) 40 MG tablet Take 20 mg by mouth daily.     metFORMIN (GLUCOPHAGE) 500 MG tablet Take 1 tablet (500 mg total) by mouth 4 (four) times daily. 360 tablet 1   metoprolol  succinate (TOPROL-XL) 25 MG 24 hr tablet Take 0.5 tablets (12.5 mg total) by mouth daily. 30 tablet 2   Multiple Vitamin (MULTIVITAMIN) tablet Take 1 tablet by mouth daily.     nitroGLYCERIN (NITROSTAT) 0.4 MG SL tablet Place 1 tablet (0.4 mg total) under the tongue every 5 (five) minutes as needed for chest pain. 90 tablet 3   ONETOUCH ULTRA test strip AS DIRECTED 100 each 4   Vericiguat (VERQUVO) 2.5 MG TABS Take 2.5 mg by mouth daily. 30 tablet 11   No current facility-administered medications for this encounter.    Allergies  Allergen Reactions   Lipitor [Atorvastatin Calcium] Other (See Comments)    Causes memory loss   Crestor [Rosuvastatin Calcium] Other (See Comments)    Causes Muscle Pain   Zocor [Simvastatin] Other (See Comments)    Causes muscle pain      Social History   Socioeconomic History   Marital status: Married    Spouse name: Doris   Number of children: 3   Years of education: Not on file   Highest education level: High school graduate  Occupational History   Occupation: Retired   Tobacco Use   Smoking status: Never   Smokeless tobacco: Never  Vaping Use   Vaping Use: Never used  Substance and Sexual Activity   Alcohol use: No   Drug use: No   Sexual activity: Not Currently  Other Topics Concern   Not on file  Social History Narrative   Daily caffeine use    Social Determinants of Health   Financial Resource Strain: Low Risk  (01/26/2022)   Overall Financial Resource Strain (CARDIA)    Difficulty of Paying Living Expenses: Not hard at all  Food Insecurity: No Food Insecurity (01/26/2022)   Hunger Vital Sign    Worried About Running Out of Food in the Last Year: Never true    Gumlog in the Last Year: Never true  Transportation Needs: No Transportation Needs (01/26/2022)   PRAPARE - Hydrologist (Medical): No    Lack of Transportation (Non-Medical): No  Physical Activity: Not on file  Stress: Not on file   Social Connections: Not on file  Intimate Partner Violence: Not on file      Family History  Problem Relation Age of Onset   Cerebrovascular Accident Mother    Colon cancer Neg Hx     Vitals:   05/29/22 0849  Weight: 62.1 kg (137 lb)    PHYSICAL EXAM: General: NAD Neck: No JVD, no thyromegaly or thyroid nodule.  Lungs: Clear to auscultation bilaterally with normal respiratory effort. CV: Nondisplaced PMI.  Heart regular S1/S2, no S3/S4, 1/6 HSM apex.  No peripheral edema.  No carotid bruit.  Normal pedal pulses.  Abdomen:  Soft, nontender, no hepatosplenomegaly, no distention.  Skin: Intact without lesions or rashes.  Neurologic: Alert and oriented x 3.  Psych: Normal affect. Extremities: No clubbing or cyanosis.  HEENT: Normal.   ASSESSMENT & PLAN: 1. Chronic systolic CHF: Ischemic cardiomyopathy. Echo 7/23 showed EF < 20%, RV mildly reduced, RVSP 80 mmHg, moderate to severe MR, mild to moderate TR.  R/LHC in 7/23 showed LIMA to LAD patent, patent SVG to distal RCA, occluded SVG to diagonal, SVG to Cx is occluded, mildly elevated PCWP, Fick CO/CI 4.5/2.6, Thermo CO/CI 3.4/1.9.  No targets for intervention on LHC.  TEE in 7/23 showed EF < 20%, moderate to severe functional MR, highly mobile echogenic mass appears to be papillary muscle, RV moderately reduced. Echo in 10/23 showed EF 25-30%, moderate LV dilation, normal RV, mod-severe ischemic MR, normal IVC.  NYHA class III symptoms, more limited by fatigue/weakness than dyspnea.  GDMT has been limited by hypotension/orthostasis.   - He seems to be intolerant of Jardiance.  - Continue Toprol XL 12.5 mg daily - Unable to tolerate low dose spironolactone.  - I suspect he would not tolerate ARB/ACEI/ARNI.  - Start digoxin 0.125 daily, check level 10 days.  - I will also start him on vericiguat 2.5 mg daily.  - Narrow QRS, not CRT candidate.  Given age, would not suggest primary prevention ICD.  - We discussed cardiac contractility  modulation.  I think he would be a good candidate and is interested, especially since there is no plan for mTEER.  I will refer to EP.  2. Mitral regurgitation: TEE in 7/23 suggests moderate-severe MR, likely functional.  Seen by structural heart team during 7/23 admit. It was recommended to optimize GDMT for HF and repeat echo in 2-3 months. Echo in 10/23 showed moderate-severe MR.  He saw Dr. Burt Knack and they decided against mTEER due to frailty and uncertainty that he would benefit significantly.  3. CAD: Hx CABG in 2003 and PCI in 2007.  LHC 07/23 with LIMA to LAD patent, patent SVG to distal RCA, occluded SVG to diagonal, SVG to Cx is occluded. No targets for intervention. No chest pain.  - Continue Plavix 75 mg daily.  - Did not tolerate statins due to myalgias and memory loss, has been on Zetia. Recent LDL 87. Consider lipid clinic referral for [place].   Followup with HF pharmacist in 3 wks for medication titration, see APP in 3 months.   Loralie Champagne 05/31/2022

## 2022-06-10 ENCOUNTER — Other Ambulatory Visit (HOSPITAL_COMMUNITY): Payer: Medicare HMO

## 2022-06-12 ENCOUNTER — Emergency Department (HOSPITAL_COMMUNITY): Payer: Medicare HMO

## 2022-06-12 ENCOUNTER — Emergency Department (HOSPITAL_COMMUNITY)
Admission: EM | Admit: 2022-06-12 | Discharge: 2022-06-12 | Disposition: A | Discharge status: Home / Self Care | Payer: Medicare HMO | Attending: Emergency Medicine | Admitting: Emergency Medicine

## 2022-06-12 ENCOUNTER — Other Ambulatory Visit: Payer: Self-pay

## 2022-06-12 ENCOUNTER — Encounter (HOSPITAL_COMMUNITY): Payer: Self-pay

## 2022-06-12 DIAGNOSIS — I110 Hypertensive heart disease with heart failure: Secondary | ICD-10-CM | POA: Insufficient documentation

## 2022-06-12 DIAGNOSIS — I2510 Atherosclerotic heart disease of native coronary artery without angina pectoris: Secondary | ICD-10-CM | POA: Diagnosis not present

## 2022-06-12 DIAGNOSIS — I509 Heart failure, unspecified: Secondary | ICD-10-CM | POA: Diagnosis not present

## 2022-06-12 DIAGNOSIS — J449 Chronic obstructive pulmonary disease, unspecified: Secondary | ICD-10-CM | POA: Diagnosis not present

## 2022-06-12 DIAGNOSIS — W19XXXA Unspecified fall, initial encounter: Secondary | ICD-10-CM

## 2022-06-12 DIAGNOSIS — S0990XA Unspecified injury of head, initial encounter: Secondary | ICD-10-CM | POA: Diagnosis not present

## 2022-06-12 DIAGNOSIS — Z7984 Long term (current) use of oral hypoglycemic drugs: Secondary | ICD-10-CM | POA: Diagnosis not present

## 2022-06-12 DIAGNOSIS — M4312 Spondylolisthesis, cervical region: Secondary | ICD-10-CM | POA: Diagnosis not present

## 2022-06-12 DIAGNOSIS — W01198A Fall on same level from slipping, tripping and stumbling with subsequent striking against other object, initial encounter: Secondary | ICD-10-CM | POA: Insufficient documentation

## 2022-06-12 DIAGNOSIS — Z79899 Other long term (current) drug therapy: Secondary | ICD-10-CM | POA: Insufficient documentation

## 2022-06-12 DIAGNOSIS — Z794 Long term (current) use of insulin: Secondary | ICD-10-CM | POA: Diagnosis not present

## 2022-06-12 DIAGNOSIS — Z7902 Long term (current) use of antithrombotics/antiplatelets: Secondary | ICD-10-CM | POA: Insufficient documentation

## 2022-06-12 DIAGNOSIS — E119 Type 2 diabetes mellitus without complications: Secondary | ICD-10-CM | POA: Insufficient documentation

## 2022-06-12 DIAGNOSIS — Z23 Encounter for immunization: Secondary | ICD-10-CM | POA: Insufficient documentation

## 2022-06-12 DIAGNOSIS — R220 Localized swelling, mass and lump, head: Secondary | ICD-10-CM | POA: Diagnosis not present

## 2022-06-12 DIAGNOSIS — S0101XA Laceration without foreign body of scalp, initial encounter: Secondary | ICD-10-CM | POA: Insufficient documentation

## 2022-06-12 MED ORDER — TETANUS-DIPHTH-ACELL PERTUSSIS 5-2.5-18.5 LF-MCG/0.5 IM SUSY
0.5000 mL | PREFILLED_SYRINGE | Freq: Once | INTRAMUSCULAR | Status: AC
  Administered 2022-06-12: 0.5 mL via INTRAMUSCULAR
  Filled 2022-06-12: qty 0.5

## 2022-06-12 NOTE — ED Notes (Signed)
Trauma Response Nurse Documentation  [name] is a 81 y.o. male arriving to [facility] ED via POV, driven by son.  On clopidogrel 75 mg daily. Trauma was activated as a Level 2 by Plavix based on the following trauma criteria Elderly patients > 65 with head trauma on anti-coagulation (excluding ASA). Trauma team at the bedside on patient arrival.   Patient cleared for CT by Dr. Sherry Ruffing. Pt transported to CT with trauma response nurse present to monitor. RN remained with the patient throughout their absence from the department for clinical observation. GCS 15.  History   Past Medical History:  Diagnosis Date   Acute exacerbation of CHF (congestive heart failure) ([place]) 09/29/2011   Atherosclerotic heart disease of native coronary artery without angina pectoris 02/12/2010   Qualifier: Diagnosis of  By: Marland Mcalpine     Benign prostatic hyperplasia with lower urinary tract symptoms 12/04/2019   CAD (coronary artery disease)    Hx of    Cellulitis 03/19/2012   Chronic obstructive pulmonary disease ([place]) 12/04/2019   Diverticulosis of colon (without mention of hemorrhage) 2011   Colonoscopy    Hiatal hernia 2011   EGD   Hypertension    Hypertensive heart disease without heart failure 12/04/2019   ITP (idiopathic thrombocytopenic purpura)    He has been diagnosed with   Pancytopenia    Thrombocytopenia, unspecified ([place]) 01/24/2013   TRANSAMINASES, SERUM, ELEVATED 02/12/2010   Qualifier: Diagnosis of  By: Marland Mcalpine     Type 2 diabetes mellitus with diabetic peripheral angiopathy without gangrene ([place]) 12/04/2019     Past Surgical History:  Procedure Laterality Date   CARDIAC CATHETERIZATION     His last heart catheterization in May of 2009 reveals a patent LIMA   CORONARY ANGIOPLASTY     Successful percutaneous transluminal coronary angioplasty of the left circumflex obuse marginal vessel   CORONARY ANGIOPLASTY WITH STENT PLACEMENT     CORONARY ARTERY BYPASS  GRAFT     x4 with a left internal mammary artery anastomosis to the left anterior descending coronary artery    CORONARY STENT PLACEMENT     successful percutaneous transluminal coronary angioplasty and stenting of the left main coronary artery   HERNIA REPAIR     RIGHT/LEFT HEART CATH AND CORONARY/GRAFT ANGIOGRAPHY N/A 01/26/2022   Procedure: RIGHT/LEFT HEART CATH AND CORONARY/GRAFT ANGIOGRAPHY;  Surgeon: Troy Sine, MD;  Location: [facility];  Service: Cardiovascular;  Laterality: N/A;   SAPHENOUS VEIN GRAFT RESECTION     graft to the first obtuse marginal, a saphenous vein graft to the first diagonal coronary artery  and a saphenous vein graft to the distal right coronary artery   TEE WITHOUT CARDIOVERSION N/A 01/28/2022   Procedure: TRANSESOPHAGEAL ECHOCARDIOGRAM (TEE);  Surgeon: Berniece Salines, DO;  Location: [place];  Service: Cardiovascular;  Laterality: N/A;   US ECHOCARDIOGRAPHY  02-08-2007   Est. EF 40-45%     Initial Focused Assessment (If applicable, or please see trauma documentation): Patient A&Ox4, GCS 15, PERR 3  CT's Completed:   CT Head and CT C-Spine   Interventions:  VSS CT Head/Cspine Posterior head lac cleaned  Plan for disposition:  Discharge home   Event Summary:  Per patient he was walking to take out trash at the [place] his son owns when he stepped on uneven pavement and fell backwards striking his head. He did not lose consciousness. Small abrasion to the posterior head, bleeding controlled. His son drove him to the hospital.  No other traumatic injury identified. CT Head/Cspine negative for injury. Patient stable for discharge home with daughter at bedside.  Bedside handoff with ED RN Arby Barrette.    [name]  Trauma Response RN  Please call TRN at [phone] for further assistance.

## 2022-06-12 NOTE — ED Notes (Signed)
Pt's wound cleaned and covered in bacitracin

## 2022-06-12 NOTE — ED Triage Notes (Signed)
Pt arrived POV from home c/o a fall this morning when he tripped taking out the trash and fell and hit the back of his head. Pt has a small laceration to the back of the scalp with bleeding controlled. Pt is on plavix.

## 2022-06-12 NOTE — Discharge Instructions (Signed)
You were seen today after a fall. Your CT head and cervical spine imaging returned normal. Please follow up with PCP as needed.

## 2022-06-12 NOTE — Progress Notes (Signed)
Orthopedic Tech Progress Note Patient Details:  [name] [birth year] [id number] Level 2 Trauma. Not needed Patient ID: [name], male   DOB: 1941/06/03, 81 y.o.   MRN: [id number]  [name] 06/12/2022, 12:14 PM

## 2022-06-12 NOTE — ED Provider Notes (Signed)
[facility] EMERGENCY DEPARTMENT Provider Note   CSN: [id number] Arrival date & time: 06/12/22  1115     History Past medical history includes hypertension, diabetes type 2, coronary artery disease, COPD, CHF, on Plavix therapy Chief Complaint  Patient presents with   [name] is a 81 y.o. male.  Patient is presenting to the ED after a mechanical fall that occurred today.  He says he tripped while he is taking out the trash and fell and hit the back of his head on asphalt.  He denies any loss of consciousness.  He was able to get up without any difficulty.  He presented to the emergency department since he is on plavix to be assessed. Last dose this morning.  Denies any chest pain, shortness of breath, abdominal pain, nausea, vomiting, confusion, numbness, weakness, speech changes, headache, gait abnormalities, joint pain or swelling.     Fall       Home Medications Prior to Admission medications   Medication Sig Start Date End Date Taking? Authorizing Provider  cholecalciferol (VITAMIN D3) 25 MCG (1000 UT) tablet Take 5,000 Units by mouth daily.    [provider]  clopidogrel (PLAVIX) 75 MG tablet Take 1 tablet (75 mg total) by mouth daily. 01/30/22   Nani Gasser, MD  digoxin (LANOXIN) 0.125 MG tablet Take 1 tablet (0.125 mg total) by mouth daily. 05/29/22   Larey Dresser, MD  ezetimibe (ZETIA) 10 MG tablet TAKE 1 TABLET BY MOUTH DAILY 03/02/22   Nahser, Wonda Cheng, MD  Insulin Pen Needle (BD PEN NEEDLE NANO U/F) 32G X 4 MM MISC 1 each by Does not apply route daily. 03/03/22   Lillard Anes, MD  Lancets MISC 1 each by Does not apply route daily as needed. Please provide lancets that fit patients lancet device 10/09/19   Lillard Anes, MD  megestrol (MEGACE) 40 MG tablet Take 20 mg by mouth daily. 03/30/22   [provider]  metFORMIN (GLUCOPHAGE) 500 MG tablet Take 1 tablet (500 mg total) by mouth 4  (four) times daily. 05/13/22   CoxElnita Maxwell, MD  metoprolol succinate (TOPROL-XL) 25 MG 24 hr tablet Take 0.5 tablets (12.5 mg total) by mouth daily. 01/30/22   Nani Gasser, MD  Multiple Vitamin (MULTIVITAMIN) tablet Take 1 tablet by mouth daily.    [provider]  nitroGLYCERIN (NITROSTAT) 0.4 MG SL tablet Place 1 tablet (0.4 mg total) under the tongue every 5 (five) minutes as needed for chest pain. 02/02/22 05/30/23  Nahser, Wonda Cheng, MD  [facility] ULTRA test strip AS DIRECTED 11/17/21   Lillard Anes, MD  Vericiguat (VERQUVO) 2.5 MG TABS Take 2.5 mg by mouth daily. 05/29/22   Larey Dresser, MD      Allergies    Lipitor [atorvastatin calcium], Crestor [rosuvastatin calcium], and Zocor [simvastatin]    Review of Systems   Review of Systems  All other systems reviewed and are negative.   Physical Exam Updated Vital Signs BP 136/87   Pulse 96   Temp 98.5 F (36.9 C) (Oral)   Resp 16   Ht '5\' 9"'$  (1.753 m)   Wt 62.6 kg   SpO2 96%   BMI 20.38 kg/m  Physical Exam Vitals and nursing note reviewed.  Constitutional:      General: He is not in acute distress.    Appearance: Normal appearance. He is well-developed. He is not ill-appearing, toxic-appearing or diaphoretic.  HENT:  Head: Normocephalic.     Comments: On occipital scalp, patient has approximately 1.5 inch well-approximated laceration with bleeding controlled.  No Battle sign.    Nose: No nasal deformity or rhinorrhea.     Mouth/Throat:     Lips: Pink. No lesions.     Mouth: Mucous membranes are moist.     Pharynx: Oropharynx is clear.     Comments: No signs of intraoral trauma Eyes:     General: Gaze aligned appropriately. No scleral icterus.       Right eye: No discharge.        Left eye: No discharge.     Extraocular Movements: Extraocular movements intact.     Conjunctiva/sclera: Conjunctivae normal.     Right eye: Right conjunctiva is not injected. No exudate or hemorrhage.    Left  eye: Left conjunctiva is not injected. No exudate or hemorrhage.    Pupils: Pupils are equal, round, and reactive to light.  Cardiovascular:     Rate and Rhythm: Normal rate.     Pulses: Normal pulses.     Heart sounds: Normal heart sounds. No murmur heard.    No friction rub. No gallop.  Pulmonary:     Effort: Pulmonary effort is normal. No respiratory distress.     Breath sounds: Normal breath sounds. No stridor. No wheezing, rhonchi or rales.  Chest:     Chest wall: No tenderness.  Abdominal:     General: Abdomen is flat. There is no distension.     Palpations: Abdomen is soft.     Tenderness: There is no abdominal tenderness. There is no right CVA tenderness, left CVA tenderness, guarding or rebound.  Musculoskeletal:        General: No deformity.     Right lower leg: No edema.     Left lower leg: No edema.     Comments: No spinal midline tenderness or step-offs  Skin:    General: Skin is warm and dry.  Neurological:     General: No focal deficit present.     Mental Status: He is alert and oriented to person, place, and time.     Comments: Alert and Oriented x 3 Speech clear with no aphasia PERRLA. EOM intact. No Nystagmus No facial asymmetry 5/5 motor strength in all four extremities.  Gait without abnormality.   Psychiatric:        Mood and Affect: Mood normal.        Speech: Speech normal.        Behavior: Behavior normal. Behavior is cooperative.     ED Results / Procedures / Treatments   Labs (all labs ordered are listed, but only abnormal results are displayed) Labs Reviewed - No data to display  EKG None  Radiology CT Head Wo Contrast  Result Date: 06/12/2022 CLINICAL DATA:  Trauma EXAM: CT HEAD WITHOUT CONTRAST CT CERVICAL SPINE WITHOUT CONTRAST TECHNIQUE: Multidetector CT imaging of the head and cervical spine was performed following the standard protocol without intravenous contrast. Multiplanar CT image reconstructions of the cervical spine were also  generated. RADIATION DOSE REDUCTION: This exam was performed according to the departmental dose-optimization program which includes automated exposure control, adjustment of the mA and/or kV according to patient size and/or use of iterative reconstruction technique. COMPARISON:  None Available. FINDINGS: CT HEAD FINDINGS Brain: No hemorrhage. No extra-axial fluid collection. No CT evidence of an acute infarct. No hydrocephalus. No mass effect. Vascular: No hyperdense vessel or unexpected calcification. Skull: Soft tissue swelling over the  midline occipital scalp. No evidence of underlying calvarial fracture Sinuses/Orbits: Bilateral lens replacements. Other: None. CT CERVICAL SPINE FINDINGS Alignment: Trace anterolisthesis of C4 on C5 and C5 on C6. Skull base and vertebrae: No acute fracture. No primary bone lesion or focal pathologic process. Soft tissues and spinal canal: No prevertebral fluid or swelling. No visible canal hematoma. Disc levels:  No evidence of high-grade spinal canal stenosis. Upper chest: Status post median sternotomy. Small bilateral pleural effusions. Other: None IMPRESSION: 1. No acute intracranial abnormality. 2. Soft tissue swelling over the midline occipital scalp. No evidence of underlying calvarial fracture. 3. No acute cervical spine fracture. 4. Small bilateral pleural effusions. Electronically Signed   By: Marin Roberts M.D.   On: 06/12/2022 12:55   CT Cervical Spine Wo Contrast  Result Date: 06/12/2022 CLINICAL DATA:  Trauma EXAM: CT HEAD WITHOUT CONTRAST CT CERVICAL SPINE WITHOUT CONTRAST TECHNIQUE: Multidetector CT imaging of the head and cervical spine was performed following the standard protocol without intravenous contrast. Multiplanar CT image reconstructions of the cervical spine were also generated. RADIATION DOSE REDUCTION: This exam was performed according to the departmental dose-optimization program which includes automated exposure control, adjustment of the mA  and/or kV according to patient size and/or use of iterative reconstruction technique. COMPARISON:  None Available. FINDINGS: CT HEAD FINDINGS Brain: No hemorrhage. No extra-axial fluid collection. No CT evidence of an acute infarct. No hydrocephalus. No mass effect. Vascular: No hyperdense vessel or unexpected calcification. Skull: Soft tissue swelling over the midline occipital scalp. No evidence of underlying calvarial fracture Sinuses/Orbits: Bilateral lens replacements. Other: None. CT CERVICAL SPINE FINDINGS Alignment: Trace anterolisthesis of C4 on C5 and C5 on C6. Skull base and vertebrae: No acute fracture. No primary bone lesion or focal pathologic process. Soft tissues and spinal canal: No prevertebral fluid or swelling. No visible canal hematoma. Disc levels:  No evidence of high-grade spinal canal stenosis. Upper chest: Status post median sternotomy. Small bilateral pleural effusions. Other: None IMPRESSION: 1. No acute intracranial abnormality. 2. Soft tissue swelling over the midline occipital scalp. No evidence of underlying calvarial fracture. 3. No acute cervical spine fracture. 4. Small bilateral pleural effusions. Electronically Signed   By: Marin Roberts M.D.   On: 06/12/2022 12:55    Procedures .Critical Care  Performed by: Adolphus Birchwood, PA-C Authorized by: Adolphus Birchwood, PA-C   Critical care provider statement:    Critical care time (minutes):  45   Critical care was necessary to treat or prevent imminent or life-threatening deterioration of the following conditions:  Trauma (Level 2)   Critical care was time spent personally by me on the following activities:  Development of treatment plan with patient or surrogate, discussions with consultants, evaluation of patient's response to treatment, examination of patient, obtaining history from patient or surrogate, review of old charts, re-evaluation of patient's condition, pulse oximetry, ordering and review of radiographic  studies and ordering and performing treatments and interventions    Medications Ordered in ED Medications  Tdap (BOOSTRIX) injection 0.5 mL (0.5 mLs Intramuscular Given 06/12/22 1342)    ED Course/ Medical Decision Making/ A&P                           Medical Decision Making Amount and/or Complexity of Data Reviewed Radiology: ordered.  Risk Prescription drug management.    MDM  This is a 81 y.o. male who presents to the ED with mechanical fall on plavix The differential of  this patient includes but is not limited to laceration, intracranial bleed, concussion, cervical spine injury  Initial Impression  Patient is well-appearing in no acute distress.  Stable vitals. History seems consistent with clear mechanical fall. He has small laceration on the backside of his head that is not gaping or bleeding.  We need to clean this to see if this needs any closure. He is completely normal neuroexam and no cervical tenderness.  As he is on Plavix therapy, will obtain CT head and cervical spine. No pain otherwise and trauma exam is otherwise completely normal with intact ABCs and no other signs of injury  I personally ordered, reviewed, and interpreted all laboratory work and imaging and agree with radiologist interpretation. Results interpreted below: CT head and cervical spine are with no acute injuries   Assessment/Plan: Imaging is unremarkable.  Laceration was cleansed and assessed and no repairable area was identified.  Will leave this open to air.  He has had his tetanus updated.  He has no other complaints or any concern for any other acute findings.  He is stable for discharge   Charting Requirements Additional history is obtained from:  Independent historian External Records from outside source obtained and reviewed including: Recent Cardiology note Social Determinants of Health:  none Pertinant PMH that complicates patient's illness: CAD on plavix  Patient Care Problems that  were addressed during this visit: - Fall: Acute illness - Scalp Laceration: Acute illness with complication Medications given in ED: T dap Reevaluation of the patient after these medicines showed that the patient stayed the same I have reviewed home medications and made changes accordingly.  Critical Care Interventions: Level 2 Trauma Consultations: Trauma Disposition: Discharge  This is a shared visit with my attending physician, Dr. Sherry Ruffing.  We have discussed this patient and they have independently evaluated this patient. The plan was altered or changed as needed.  Portions of this note were generated with [employer]. Dictation errors may occur despite best attempts at proofreading.    Final Clinical Impression(s) / ED Diagnoses Final diagnoses:  Fall, initial encounter  Laceration of scalp, initial encounter    Rx / DC Orders ED Discharge Orders     None         Adolphus Birchwood, PA-C 06/12/22 1417    Tegeler, Gwenyth Allegra, MD 06/12/22 1422    Tegeler, Gwenyth Allegra, MD 06/12/22 1424

## 2022-06-15 ENCOUNTER — Encounter: Payer: Self-pay | Admitting: *Deleted

## 2022-06-15 ENCOUNTER — Telehealth: Payer: Self-pay | Admitting: *Deleted

## 2022-06-15 NOTE — Patient Outreach (Signed)
  Care Coordination [facility] Note Transition Care Management Follow-up Telephone Call Date of discharge and from where: ED Visit 06/12/22- [place]; fall with scalp laceration; EMMI ED Red Alert, "No scheduled follow up;" verified no scheduled follow recommended post- ED visit- follow up indicated "as needed" How have you been since you were released from the hospital? "Overall I am okay, just sore.  My family is keeping a close eye on me and they help me with anything I need.  I am using my cane, I don't want to use the walker, but my legs seem to get tangled up in it, and the cane usually works best.  The asphalt was uneven when I was taking the trash out and I just fell.  I am taking all of my medicines the way they tell me to.  I don't have any concerns and seem to be okay." Any questions or concerns? No  Items Reviewed: Did the pt receive and understand the discharge instructions provided? Yes  Medications obtained and verified? Yes  Other? No  Any new allergies since your discharge? No  Dietary orders reviewed? Yes Do you have support at home? Yes  extended family and spouse assisting with care needs as indicated; patient reports essentially independent in self-care  Home Care and Equipment/Supplies: Were home health services ordered? no If so, what is the name of the agency? N/A  Has the agency set up a time to come to the patient's home? not applicable Were any new equipment or medical supplies ordered?  No What is the name of the medical supply agency? N/A Were you able to get the supplies/equipment? not applicable Do you have any questions related to the use of the equipment or supplies? No  Functional Questionnaire: (I = Independent and D = Dependent) ADLs: I  extended family and spouse assisting with care needs as indicated  Bathing/Dressing- I  Meal Prep- I  extended family and spouse assisting with care needs as indicated  Eating- I  Maintaining continence-  I  Transferring/Ambulation- I  Managing Meds- D; daughter manages medications by filling weekly pill planner box; patient then takes medications independently  Follow up appointments reviewed:  PCP Hospital f/u appt confirmed? No  Scheduled to see - on - @ -: verified no scheduled PCP office visit recommended at time of ED visit 06/12/22 Specialist Hospital f/u appt confirmed? No  Scheduled to see - on - @ - Are transportation arrangements needed? No  If their condition worsens, is the pt aware to call PCP or go to the Emergency Dept.? Yes Was the patient provided with contact information for the PCP's office or ED? No- patient declined verifies he has all contact information for care providers Was to pt encouraged to call back with questions or concerns? Yes  SDOH assessments and interventions completed:   Yes SDOH Interventions Today    Flowsheet Row Most Recent Value  SDOH Interventions   Food Insecurity Interventions Intervention Not Indicated  Transportation Interventions Intervention Not Indicated  [family provides transportation]      Care Coordination Interventions:  Fall assessment and education completed; provided education around signs/ symptoms/ reasons to seek follow up care post-recent ED visit for fall     Encounter Outcome:  Pt. Visit Completed    Oneta Rack, RN, BSN, CCRN Alumnus RN CM Care Coordination/ Transition of [place] Management [phone]: direct office

## 2022-06-16 ENCOUNTER — Other Ambulatory Visit (HOSPITAL_COMMUNITY): Payer: Self-pay

## 2022-06-16 ENCOUNTER — Telehealth (HOSPITAL_COMMUNITY): Payer: Self-pay

## 2022-06-16 NOTE — Telephone Encounter (Signed)
Advanced Heart Failure Patient Advocate Encounter  The patient was approved for a [employer] grant that will help cover the cost of Verquvo.  Total amount awarded, $10,000.  Effective: 05/17/2022 - 05/17/2023.  BIN [id number] PCN [id number] [id number] ID [id number]  Patient provided with approval and processing information via [employer].  Clista Bernhardt, CPhT Rx Patient Advocate Phone: [phone]

## 2022-06-23 ENCOUNTER — Other Ambulatory Visit (HOSPITAL_COMMUNITY): Payer: Self-pay | Admitting: Cardiology

## 2022-06-23 ENCOUNTER — Other Ambulatory Visit (HOSPITAL_COMMUNITY): Payer: Self-pay

## 2022-06-23 MED ORDER — METOPROLOL SUCCINATE ER 25 MG PO TB24: 12.5000 mg | ORAL_TABLET | Freq: Every day | ORAL | 2 refills | Status: DC

## 2022-06-25 ENCOUNTER — Ambulatory Visit (HOSPITAL_COMMUNITY)
Admission: RE | Admit: 2022-06-25 | Discharge: 2022-06-25 | Disposition: A | Discharge status: Home / Self Care | Payer: Medicare HMO | Source: Ambulatory Visit | Attending: Cardiology | Admitting: Cardiology

## 2022-06-25 DIAGNOSIS — I5022 Chronic systolic (congestive) heart failure: Secondary | ICD-10-CM | POA: Diagnosis not present

## 2022-06-25 LAB — BASIC METABOLIC PANEL
Anion gap: 10 (ref 5–15)
BUN: 18 mg/dL (ref 8–23)
CO2: 23 mmol/L (ref 22–32)
Calcium: 9.5 mg/dL (ref 8.9–10.3)
Chloride: 100 mmol/L (ref 98–111)
Creatinine, Ser: 0.9 mg/dL (ref 0.61–1.24)
GFR, Estimated: >60 mL/min (ref >60)
Glucose, Bld: 103 mg/dL — ABNORMAL HIGH (ref 70–99)
Potassium: 4 mmol/L (ref 3.5–5.1)
Sodium: 133 mmol/L — ABNORMAL LOW (ref 135–145)

## 2022-06-25 LAB — DIGOXIN LEVEL: Digoxin Level: 0.6 ng/mL — ABNORMAL LOW (ref 0.8–2.0)

## 2022-06-29 ENCOUNTER — Other Ambulatory Visit (HOSPITAL_COMMUNITY): Payer: Medicare HMO

## 2022-06-30 ENCOUNTER — Other Ambulatory Visit: Payer: Self-pay | Admitting: Legal Medicine

## 2022-06-30 ENCOUNTER — Other Ambulatory Visit: Payer: Self-pay

## 2022-06-30 ENCOUNTER — Telehealth: Payer: Self-pay

## 2022-06-30 MED ORDER — MEGESTROL ACETATE 40 MG PO TABS: 40.0000 mg | ORAL_TABLET | Freq: Every day | ORAL | 2 refills | Status: DC

## 2022-06-30 NOTE — Telephone Encounter (Signed)
Patient needs this medication Megestrol sent to [facility] in [place]

## 2022-07-21 ENCOUNTER — Other Ambulatory Visit: Payer: Self-pay

## 2022-07-21 ENCOUNTER — Inpatient Hospital Stay: Payer: Medicare HMO | Attending: Pulmonary Disease

## 2022-07-21 ENCOUNTER — Inpatient Hospital Stay (HOSPITAL_BASED_OUTPATIENT_CLINIC_OR_DEPARTMENT_OTHER): Payer: Medicare HMO | Admitting: Internal Medicine

## 2022-07-21 VITALS — BP 133/87 | HR 88 | Resp 19 | Wt 142.6 lb

## 2022-07-21 DIAGNOSIS — D693 Immune thrombocytopenic purpura: Secondary | ICD-10-CM

## 2022-07-21 DIAGNOSIS — D649 Anemia, unspecified: Secondary | ICD-10-CM | POA: Diagnosis not present

## 2022-07-21 LAB — CMP (CANCER CENTER ONLY)
ALT: 14 U/L (ref 0–44)
AST: 19 U/L (ref 15–41)
Albumin: 4 g/dL (ref 3.5–5.0)
Alkaline Phosphatase: 39 U/L (ref 38–126)
Anion gap: 8 (ref 5–15)
BUN: 19 mg/dL (ref 8–23)
CO2: 25 mmol/L (ref 22–32)
Calcium: 10 mg/dL (ref 8.9–10.3)
Chloride: 106 mmol/L (ref 98–111)
Creatinine: 0.84 mg/dL (ref 0.61–1.24)
GFR, Estimated: >60 mL/min (ref >60)
Glucose, Bld: 122 mg/dL — ABNORMAL HIGH (ref 70–99)
Potassium: 4 mmol/L (ref 3.5–5.1)
Sodium: 139 mmol/L (ref 135–145)
Total Bilirubin: 0.8 mg/dL (ref 0.3–1.2)
Total Protein: 7.6 g/dL (ref 6.5–8.1)

## 2022-07-21 LAB — CBC WITH DIFFERENTIAL (CANCER CENTER ONLY)
Abs Immature Granulocytes: 0.03 10*3/uL (ref 0.00–0.07)
Basophils Absolute: 0 10*3/uL (ref 0.0–0.1)
Basophils Relative: 0 %
Eosinophils Absolute: 0.2 10*3/uL (ref 0.0–0.5)
Eosinophils Relative: 3 %
HCT: 34.2 % — ABNORMAL LOW (ref 39.0–52.0)
Hemoglobin: 12 g/dL — ABNORMAL LOW (ref 13.0–17.0)
Immature Granulocytes: 0 %
Lymphocytes Relative: 17 %
Lymphs Abs: 1.2 10*3/uL (ref 0.7–4.0)
MCH: 34.6 pg — ABNORMAL HIGH (ref 26.0–34.0)
MCHC: 35.1 g/dL (ref 30.0–36.0)
MCV: 98.6 fL (ref 80.0–100.0)
Monocytes Absolute: 0.5 10*3/uL (ref 0.1–1.0)
Monocytes Relative: 7 %
Neutro Abs: 5.2 10*3/uL (ref 1.7–7.7)
Neutrophils Relative %: 73 %
Platelet Count: 144 10*3/uL — ABNORMAL LOW (ref 150–400)
RBC: 3.47 MIL/uL — ABNORMAL LOW (ref 4.22–5.81)
RDW: 13 % (ref 11.5–15.5)
WBC Count: 7.3 10*3/uL (ref 4.0–10.5)
nRBC: 0 % (ref 0.0–0.2)

## 2022-07-21 LAB — LACTATE DEHYDROGENASE: LDH: 166 U/L (ref 98–192)

## 2022-07-21 NOTE — Progress Notes (Signed)
[place] [phone]   [phone]  OFFICE PROGRESS NOTE  Lillard Anes, MD (Inactive) No address on file  DIAGNOSIS: Idiopathic thrombocytopenic purpura   PRIOR THERAPY: None   CURRENT THERAPY: Observation.  INTERVAL HISTORY: [name] 82 y.o. male returns to the clinic today for follow-up visit accompanied by his son.  Unfortunately the patient buried his wife yesterday after she had respiratory failure.  He is still very tearful.  He denied having any current chest pain but has shortness of breath with exertion as well as fatigue.  He denied having any fever or chills.  He has no nausea, vomiting, diarrhea or constipation.  He has no headache or visual changes.  He denied having any recent weight loss or night sweats.  He is here today for evaluation and repeat blood work for close monitoring of his thrombocytopenia.   MEDICAL HISTORY: Past Medical History:  Diagnosis Date   Acute exacerbation of CHF (congestive heart failure) ([place]) 09/29/2011   Atherosclerotic heart disease of native coronary artery without angina pectoris 02/12/2010   Qualifier: Diagnosis of  By: Marland Mcalpine     Benign prostatic hyperplasia with lower urinary tract symptoms 12/04/2019   CAD (coronary artery disease)    Hx of    Cellulitis 03/19/2012   Chronic obstructive pulmonary disease ([place]) 12/04/2019   Diverticulosis of colon (without mention of hemorrhage) 2011   Colonoscopy    Hiatal hernia 2011   EGD   Hypertension    Hypertensive heart disease without heart failure 12/04/2019   ITP (idiopathic thrombocytopenic purpura)    He has been diagnosed with   Pancytopenia    Thrombocytopenia, unspecified ([place]) 01/24/2013   TRANSAMINASES, SERUM, ELEVATED 02/12/2010   Qualifier: Diagnosis of  By: Marland Mcalpine     Type 2 diabetes mellitus with diabetic peripheral angiopathy without gangrene ([place]) 12/04/2019    ALLERGIES:  is  allergic to lipitor [atorvastatin calcium], crestor [rosuvastatin calcium], and zocor [simvastatin].  MEDICATIONS:  Current Outpatient Medications  Medication Sig Dispense Refill   cholecalciferol (VITAMIN D3) 25 MCG (1000 UT) tablet Take 5,000 Units by mouth daily.     clopidogrel (PLAVIX) 75 MG tablet Take 1 tablet (75 mg total) by mouth daily. 90 tablet 3   digoxin (LANOXIN) 0.125 MG tablet Take 1 tablet (0.125 mg total) by mouth daily. 90 tablet 3   ezetimibe (ZETIA) 10 MG tablet TAKE 1 TABLET BY MOUTH DAILY 90 tablet 1   Insulin Pen Needle (BD PEN NEEDLE NANO U/F) 32G X 4 MM MISC 1 each by Does not apply route daily. 100 each 3   Lancets MISC 1 each by Does not apply route daily as needed. Please provide lancets that fit patients lancet device 100 each 4   megestrol (MEGACE) 40 MG tablet Take 1 tablet (40 mg total) by mouth daily. 30 tablet 2   metFORMIN (GLUCOPHAGE) 500 MG tablet Take 1 tablet (500 mg total) by mouth 4 (four) times daily. 360 tablet 1   metoprolol succinate (TOPROL-XL) 25 MG 24 hr tablet Take 0.5 tablets (12.5 mg total) by mouth daily. 30 tablet 2   Multiple Vitamin (MULTIVITAMIN) tablet Take 1 tablet by mouth daily.     nitroGLYCERIN (NITROSTAT) 0.4 MG SL tablet Place 1 tablet (0.4 mg total) under the tongue every 5 (five) minutes as needed for chest pain. 90 tablet 3   ONETOUCH ULTRA test strip AS DIRECTED 100 each 4   Vericiguat (VERQUVO)  2.5 MG TABS Take 2.5 mg by mouth daily. 30 tablet 11   No current facility-administered medications for this visit.    SURGICAL HISTORY:  Past Surgical History:  Procedure Laterality Date   CARDIAC CATHETERIZATION     His last heart catheterization in May of 2009 reveals a patent LIMA   CORONARY ANGIOPLASTY     Successful percutaneous transluminal coronary angioplasty of the left circumflex obuse marginal vessel   CORONARY ANGIOPLASTY WITH STENT PLACEMENT     CORONARY ARTERY BYPASS GRAFT     x4 with a left internal mammary  artery anastomosis to the left anterior descending coronary artery    CORONARY STENT PLACEMENT     successful percutaneous transluminal coronary angioplasty and stenting of the left main coronary artery   HERNIA REPAIR     RIGHT/LEFT HEART CATH AND CORONARY/GRAFT ANGIOGRAPHY N/A 01/26/2022   Procedure: RIGHT/LEFT HEART CATH AND CORONARY/GRAFT ANGIOGRAPHY;  Surgeon: Troy Sine, MD;  Location: [facility];  Service: Cardiovascular;  Laterality: N/A;   SAPHENOUS VEIN GRAFT RESECTION     graft to the first obtuse marginal, a saphenous vein graft to the first diagonal coronary artery  and a saphenous vein graft to the distal right coronary artery   TEE WITHOUT CARDIOVERSION N/A 01/28/2022   Procedure: TRANSESOPHAGEAL ECHOCARDIOGRAM (TEE);  Surgeon: Berniece Salines, DO;  [facility];  Service: Cardiovascular;  Laterality: N/A;   US ECHOCARDIOGRAPHY  02-08-2007   Est. EF 40-45%    REVIEW OF SYSTEMS:  A comprehensive review of systems was negative except for: Constitutional: positive for fatigue   PHYSICAL EXAMINATION: General appearance: alert, cooperative, and no distress Head: Normocephalic, without obvious abnormality, atraumatic Neck: no adenopathy Lymph nodes: Cervical, supraclavicular, and axillary nodes normal. Resp: clear to auscultation bilaterally Back: symmetric, no curvature. ROM normal. No CVA tenderness. Cardio: regular rate and rhythm, S1, S2 normal, no murmur, click, rub or gallop GI: soft, non-tender; bowel sounds normal; no masses,  no organomegaly Extremities: extremities normal, atraumatic, no cyanosis or edema  ECOG PERFORMANCE STATUS: 1 - Symptomatic but completely ambulatory  Blood pressure 133/87, pulse 88, resp. rate 19, weight 142 lb 9 oz (64.7 kg), SpO2 100 %.  LABORATORY DATA: Lab Results  Component Value Date   WBC 7.3 07/21/2022   HGB 12.0 (L) 07/21/2022   HCT 34.2 (L) 07/21/2022   MCV 98.6 07/21/2022   PLT 144 (L) 07/21/2022       Chemistry      Component Value Date/Time   NA 133 (L) 06/25/2022 0901   NA 137 05/13/2022 0823   NA 135 (L) 07/14/2016 0935   K 4.0 06/25/2022 0901   K 4.4 07/14/2016 0935   CL 100 06/25/2022 0901   CL 106 07/19/2012 1028   CO2 23 06/25/2022 0901   CO2 24 07/14/2016 0935   BUN 18 06/25/2022 0901   BUN 16 05/13/2022 0823   BUN 10.4 07/14/2016 0935   CREATININE 0.90 06/25/2022 0901   CREATININE 0.78 07/22/2021 1249   CREATININE 0.8 07/14/2016 0935      Component Value Date/Time   CALCIUM 9.5 06/25/2022 0901   CALCIUM 9.0 07/14/2016 0935   ALKPHOS 46 05/13/2022 0823   ALKPHOS 60 07/14/2016 0935   AST 17 05/13/2022 0823   AST 18 07/22/2021 1249   AST 31 07/14/2016 0935   ALT 10 05/13/2022 0823   ALT 11 07/22/2021 1249   ALT 33 07/14/2016 0935   BILITOT 1.1 05/13/2022 0823   BILITOT 0.8 07/22/2021 1249  BILITOT 1.67 (H) 07/14/2016 0935       RADIOGRAPHIC STUDIES: No results found.  ASSESSMENT AND PLAN:  This is a very pleasant 82 years old white male with idiopathic thrombocytopenic purpura. The patient is currently on observation and he is feeling fine with no concerning complaints except for the mild fatigue from the anemia.  He has repeat CBC today that showed no concerning findings except for the mild anemia and thrombocytopenia.  His platelets count are 144,000. I recommended for him to continue on observation with repeat CBC, comprehensive metabolic panel and LDH in 1 year.  He was advised to call immediately if he has any other concerning symptoms in the interval. I recommended for the patient to continue on observation in addition to multivitamin supplements.  All questions were answered. The patient knows to call the clinic with any problems, questions or concerns. We can certainly see the patient much sooner if necessary.  Disclaimer: This note was dictated with voice recognition software. Similar sounding words can inadvertently be transcribed and may not be  corrected upon review.

## 2022-08-09 NOTE — Progress Notes (Unsigned)
Electrophysiology Office Note:    Date:  08/09/2022   ID:  [name], DOB 1941-03-24, MRN [id number]  PCP:  Caleb Anes, MD (Inactive)  [facility] Cardiologist:  Caleb Moores, MD  [facility] Electrophysiologist:  Caleb Epley, MD   Referring MD: Caleb Dresser, MD   Chief Complaint: ***  History of Present Illness:    [name] is a 82 y.o. male who presents for an evaluation of CHF at the request of Dr [name]. Their medical history includes CAD s/p CABG in 2003, DM.  01/2022 admitted with weakness/dyspnea--EF<20%. He has very significant MR and was referred to Dr [name] for consideration of MV clip. He was deemed not to be a candidate. He is not a candidate for ICD given age. He is referred to discuss CCM given persistent symptoms.    Past Medical History:  Diagnosis Date   Acute exacerbation of CHF (congestive heart failure) ([place]) 09/29/2011   Atherosclerotic heart disease of native coronary artery without angina pectoris 02/12/2010   Qualifier: Diagnosis of  By: Marland Mcalpine     Benign prostatic hyperplasia with lower urinary tract symptoms 12/04/2019   CAD (coronary artery disease)    Hx of    Cellulitis 03/19/2012   Chronic obstructive pulmonary disease ([place]) 12/04/2019   Diverticulosis of colon (without mention of hemorrhage) 2011   Colonoscopy    Hiatal hernia 2011   EGD   Hypertension    Hypertensive heart disease without heart failure 12/04/2019   ITP (idiopathic thrombocytopenic purpura)    He has been diagnosed with   Pancytopenia    Thrombocytopenia, unspecified ([place]) 01/24/2013   TRANSAMINASES, SERUM, ELEVATED 02/12/2010   Qualifier: Diagnosis of  By: Marland Mcalpine     Type 2 diabetes mellitus with diabetic peripheral angiopathy without gangrene ([place]) 12/04/2019    Past Surgical History:  Procedure Laterality Date   CARDIAC CATHETERIZATION     His last heart catheterization in May of 2009 reveals a patent  LIMA   CORONARY ANGIOPLASTY     Successful percutaneous transluminal coronary angioplasty of the left circumflex obuse marginal vessel   CORONARY ANGIOPLASTY WITH STENT PLACEMENT     CORONARY ARTERY BYPASS GRAFT     x4 with a left internal mammary artery anastomosis to the left anterior descending coronary artery    CORONARY STENT PLACEMENT     successful percutaneous transluminal coronary angioplasty and stenting of the left main coronary artery   HERNIA REPAIR     RIGHT/LEFT HEART CATH AND CORONARY/GRAFT ANGIOGRAPHY N/A 01/26/2022   Procedure: RIGHT/LEFT HEART CATH AND CORONARY/GRAFT ANGIOGRAPHY;  Surgeon: Troy Sine, MD;  Location: [facility];  Service: Cardiovascular;  Laterality: N/A;   SAPHENOUS VEIN GRAFT RESECTION     graft to the first obtuse marginal, a saphenous vein graft to the first diagonal coronary artery  and a saphenous vein graft to the distal right coronary artery   TEE WITHOUT CARDIOVERSION N/A 01/28/2022   Procedure: TRANSESOPHAGEAL ECHOCARDIOGRAM (TEE);  Surgeon: Berniece Salines, DO;  Location: [facility];  Service: Cardiovascular;  Laterality: N/A;   US ECHOCARDIOGRAPHY  02-08-2007   Est. EF 40-45%    Current Medications: No outpatient medications have been marked as taking for the 08/10/22 encounter (Appointment) with Caleb Epley, MD.     Allergies:   Lipitor [atorvastatin calcium], Crestor [rosuvastatin calcium], and Zocor [simvastatin]   Social History   Socioeconomic History   Marital status: Married    Spouse name:  [name]   Number of children: 3   Years of education: Not on file   Highest education level: High school graduate  Occupational History   Occupation: Retired   Tobacco Use   Smoking status: Never   Smokeless tobacco: Never  Vaping Use   Vaping Use: Never used  Substance and Sexual Activity   Alcohol use: No   Drug use: No   Sexual activity: Not Currently  Other Topics Concern   Not on file  Social History Narrative    Daily caffeine use    Social Determinants of Health   Financial Resource Strain: Low Risk  (01/26/2022)   Overall Financial Resource Strain (CARDIA)    Difficulty of Paying Living Expenses: Not hard at all  Food Insecurity: No Food Insecurity (06/15/2022)   Hunger Vital Sign    Worried About Running Out of Food in the Last Year: Never true    [place] in the Last Year: Never true  Transportation Needs: No Transportation Needs (06/15/2022)   PRAPARE - Hydrologist (Medical): No    Lack of Transportation (Non-Medical): No  Physical Activity: Not on file  Stress: Not on file  Social Connections: Not on file     Family History: The patient's family history includes Cerebrovascular Accident in his mother. There is no history of Colon cancer.  ROS:   Please see the history of present illness.    All other systems reviewed and are negative.  EKGs/Labs/Other Studies Reviewed:    The following studies were reviewed today:  04/2022 Echo - EF25, severe MR.  01/2022 TEE - severe MR  03/2022 ECG shows narrow QRS.    Recent Labs: 01/24/2022: TSH 2.684 01/29/2022: Magnesium 2.0 03/30/2022: B Natriuretic Peptide 1,014.2 07/21/2022: ALT 14; BUN 19; Creatinine 0.84; Hemoglobin 12.0; Platelet Count 144; Potassium 4.0; Sodium 139  Recent Lipid Panel    Component Value Date/Time   CHOL 157 05/13/2022 0823   TRIG 75 05/13/2022 0823   HDL 56 05/13/2022 0823   CHOLHDL 2.8 05/13/2022 0823   CHOLHDL 2.7 05/11/2016 1002   VLDL 18 05/11/2016 1002   LDLCALC 87 05/13/2022 0823    Physical Exam:    VS:  There were no vitals taken for this visit.    Wt Readings from Last 3 Encounters:  07/21/22 142 lb 9 oz (64.7 kg)  06/12/22 138 lb (62.6 kg)  05/29/22 137 lb (62.1 kg)     GEN: *** Well nourished, well developed in no acute distress CARDIAC: ***RRR, no murmurs, rubs, gallops RESPIRATORY:  Clear to auscultation without rales, wheezing or rhonchi        ASSESSMENT:    No diagnosis found. PLAN:    In order of problems listed above:  #chronic systolic HF #mod-severe MR #Mod RV dysfunction NYHA II. Warm and relatively euvolemic. No dyspnea. Narrow QRS. Not felt to be a candidate for mitraclip. Not felt ot be a candidate for ICD given age.  Discussed CCM therapy with the patient in detail during today's appointment. Discussed risks and recovery and he wishes to proceed.  Plan for a left sided implant.  Risks, benefits, alternatives to PPM implantation were discussed in detail with the patient today. The patient understands that the risks include but are not limited to bleeding, infection, pneumothorax, perforation, tamponade, vascular damage, renal failure, MI, stroke, death, and lead dislodgement and wishes to proceed.    Hold Plavix for 5 days prior to implant. Discussed risks of holding  plavix during today's appointment.    Medication Adjustments/Labs and Tests Ordered: Current medicines are reviewed at length with the patient today.  Concerns regarding medicines are outlined above.  No orders of the defined types were placed in this encounter.  No orders of the defined types were placed in this encounter.    Signed, Hilton Cork. Quentin Ore, MD, [facility], [facility] 08/09/2022 4:22 PM    Electrophysiology [facility]

## 2022-08-10 ENCOUNTER — Encounter: Payer: Self-pay | Admitting: Cardiology

## 2022-08-10 ENCOUNTER — Ambulatory Visit: Payer: Medicare HMO | Attending: Cardiology | Admitting: Cardiology

## 2022-08-10 VITALS — BP 120/66 | HR 80 | Ht 69.0 in | Wt 143.0 lb

## 2022-08-10 DIAGNOSIS — I2584 Coronary atherosclerosis due to calcified coronary lesion: Secondary | ICD-10-CM

## 2022-08-10 DIAGNOSIS — I5022 Chronic systolic (congestive) heart failure: Secondary | ICD-10-CM | POA: Diagnosis not present

## 2022-08-10 DIAGNOSIS — I2510 Atherosclerotic heart disease of native coronary artery without angina pectoris: Secondary | ICD-10-CM

## 2022-08-10 NOTE — Progress Notes (Deleted)
Electrophysiology Office Note:    Date:  08/10/2022   ID:  [name], DOB Nov 18, 1940, MRN [id number]  PCP:  Rochel Brome, MD  [facility] Cardiologist:  Mertie Moores, MD  [facility] Electrophysiologist:  Vickie Epley, MD   Referring MD: Larey Dresser, MD   Chief Complaint: ***  History of Present Illness:    [name] is a 82 y.o. male who presents for an evaluation of CHF at the request of Dr Aundra Dubin. Their medical history includes CAD s/p CABG in 2003, DM.  01/2022 admitted with weakness/dyspnea--EF<20%. He has very significant MR and was referred to Dr Burt Knack for consideration of MV clip. He was deemed not to be a candidate. He is not a candidate for ICD given age. He is referred to discuss CCM given persistent symptoms.    Past Medical History:  Diagnosis Date   Acute exacerbation of CHF (congestive heart failure) ([place]) 09/29/2011   Atherosclerotic heart disease of native coronary artery without angina pectoris 02/12/2010   Qualifier: Diagnosis of  By: Marland Mcalpine     Benign prostatic hyperplasia with lower urinary tract symptoms 12/04/2019   CAD (coronary artery disease)    Hx of    Cellulitis 03/19/2012   Chronic obstructive pulmonary disease ([place]) 12/04/2019   Diverticulosis of colon (without mention of hemorrhage) 2011   Colonoscopy    Hiatal hernia 2011   EGD   Hypertension    Hypertensive heart disease without heart failure 12/04/2019   ITP (idiopathic thrombocytopenic purpura)    He has been diagnosed with   Pancytopenia    Thrombocytopenia, unspecified ([place]) 01/24/2013   TRANSAMINASES, SERUM, ELEVATED 02/12/2010   Qualifier: Diagnosis of  By: Marland Mcalpine     Type 2 diabetes mellitus with diabetic peripheral angiopathy without gangrene ([place]) 12/04/2019    Past Surgical History:  Procedure Laterality Date   CARDIAC CATHETERIZATION     His last heart catheterization in May of 2009 reveals a patent LIMA   CORONARY  ANGIOPLASTY     Successful percutaneous transluminal coronary angioplasty of the left circumflex obuse marginal vessel   CORONARY ANGIOPLASTY WITH STENT PLACEMENT     CORONARY ARTERY BYPASS GRAFT     x4 with a left internal mammary artery anastomosis to the left anterior descending coronary artery    CORONARY STENT PLACEMENT     successful percutaneous transluminal coronary angioplasty and stenting of the left main coronary artery   HERNIA REPAIR     RIGHT/LEFT HEART CATH AND CORONARY/GRAFT ANGIOGRAPHY N/A 01/26/2022   Procedure: RIGHT/LEFT HEART CATH AND CORONARY/GRAFT ANGIOGRAPHY;  Surgeon: Troy Sine, MD;  Location: [place] [facility];  Service: Cardiovascular;  Laterality: N/A;   SAPHENOUS VEIN GRAFT RESECTION     graft to the first obtuse marginal, a saphenous vein graft to the first diagonal coronary artery  and a saphenous vein graft to the distal right coronary artery   TEE WITHOUT CARDIOVERSION N/A 01/28/2022   Procedure: TRANSESOPHAGEAL ECHOCARDIOGRAM (TEE);  Surgeon: Berniece Salines, DO;  [facility]: [facility];  Service: Cardiovascular;  Laterality: N/A;   US ECHOCARDIOGRAPHY  02-08-2007   Est. EF 40-45%    Current Medications: Current Meds  Medication Sig   cholecalciferol (VITAMIN D3) 25 MCG (1000 UT) tablet Take 5,000 Units by mouth daily.   clopidogrel (PLAVIX) 75 MG tablet Take 1 tablet (75 mg total) by mouth daily.   digoxin (LANOXIN) 0.125 MG tablet Take 1 tablet (0.125 mg total) by mouth daily.  ezetimibe (ZETIA) 10 MG tablet TAKE 1 TABLET BY MOUTH DAILY   Insulin Pen Needle (BD PEN NEEDLE NANO U/F) 32G X 4 MM MISC 1 each by Does not apply route daily.   Lancets MISC 1 each by Does not apply route daily as needed. Please provide lancets that fit patients lancet device   megestrol (MEGACE) 40 MG tablet Take 1 tablet (40 mg total) by mouth daily.   metFORMIN (GLUCOPHAGE) 500 MG tablet Take 1 tablet (500 mg total) by mouth 4 (four) times daily.   metoprolol succinate  (TOPROL-XL) 25 MG 24 hr tablet Take 0.5 tablets (12.5 mg total) by mouth daily.   Multiple Vitamin (MULTIVITAMIN) tablet Take 1 tablet by mouth daily.   nitroGLYCERIN (NITROSTAT) 0.4 MG SL tablet Place 1 tablet (0.4 mg total) under the tongue every 5 (five) minutes as needed for chest pain.   ONETOUCH ULTRA test strip AS DIRECTED   Vericiguat (VERQUVO) 2.5 MG TABS Take 2.5 mg by mouth daily.     Allergies:   Lipitor [atorvastatin calcium], Crestor [rosuvastatin calcium], and Zocor [simvastatin]   Social History   Socioeconomic History   Marital status: Married    Spouse name: Doris   Number of children: 3   Years of education: Not on file   Highest education level: High school graduate  Occupational History   Occupation: Retired   Tobacco Use   Smoking status: Never   Smokeless tobacco: Never  Vaping Use   Vaping Use: Never used  Substance and Sexual Activity   Alcohol use: No   Drug use: No   Sexual activity: Not Currently  Other Topics Concern   Not on file  Social History Narrative   Daily caffeine use    Social Determinants of Health   Financial Resource Strain: Low Risk  (01/26/2022)   Overall Financial Resource Strain (CARDIA)    Difficulty of Paying Living Expenses: Not hard at all  Food Insecurity: No Food Insecurity (06/15/2022)   Hunger Vital Sign    Worried About Running Out of Food in the Last Year: Never true    Ran Out of Food in the Last Year: Never true  Transportation Needs: No Transportation Needs (06/15/2022)   PRAPARE - Hydrologist (Medical): No    Lack of Transportation (Non-Medical): No  Physical Activity: Not on file  Stress: Not on file  Social Connections: Not on file     Family History: The patient's family history includes Cerebrovascular Accident in his mother. There is no history of Colon cancer.  ROS:   Please see the history of present illness.    All other systems reviewed and are  negative.  EKGs/Labs/Other Studies Reviewed:    The following studies were reviewed today:  04/2022 Echo - EF25, severe MR.  01/2022 TEE - severe MR  03/2022 ECG shows narrow QRS.    Recent Labs: 01/24/2022: TSH 2.684 01/29/2022: Magnesium 2.0 03/30/2022: B Natriuretic Peptide 1,014.2 07/21/2022: ALT 14; BUN 19; Creatinine 0.84; Hemoglobin 12.0; Platelet Count 144; Potassium 4.0; Sodium 139  Recent Lipid Panel    Component Value Date/Time   CHOL 157 05/13/2022 0823   TRIG 75 05/13/2022 0823   HDL 56 05/13/2022 0823   CHOLHDL 2.8 05/13/2022 0823   CHOLHDL 2.7 05/11/2016 1002   VLDL 18 05/11/2016 1002   LDLCALC 87 05/13/2022 0823    Physical Exam:    VS:  BP 120/66   Pulse 80   Ht '5\' 9"'$  (1.753 m)  Wt 143 lb (64.9 kg)   SpO2 99%   BMI 21.12 kg/m     Wt Readings from Last 3 Encounters:  08/10/22 143 lb (64.9 kg)  07/21/22 142 lb 9 oz (64.7 kg)  06/12/22 138 lb (62.6 kg)     GEN: *** Well nourished, well developed in no acute distress CARDIAC: ***RRR, no murmurs, rubs, gallops RESPIRATORY:  Clear to auscultation without rales, wheezing or rhonchi       ASSESSMENT:    1. Chronic systolic heart failure ([place])   2. Coronary artery disease due to calcified coronary lesion    PLAN:    In order of problems listed above:  #chronic systolic HF #mod-severe MR #Mod RV dysfunction NYHA II. Warm and relatively euvolemic. No dyspnea. Narrow QRS. Not felt to be a candidate for mitraclip. Not felt ot be a candidate for ICD given age.  Discussed CCM therapy with the patient in detail during today's appointment. Discussed risks and recovery and he wishes to proceed.  Plan for a left sided implant.  Risks, benefits, alternatives to PPM implantation were discussed in detail with the patient today. The patient understands that the risks include but are not limited to bleeding, infection, pneumothorax, perforation, tamponade, vascular damage, renal failure, MI, stroke, death,  and lead dislodgement and wishes to proceed.    Hold Plavix for 5 days prior to implant. Discussed risks of holding plavix during today's appointment.    Medication Adjustments/Labs and Tests Ordered: Current medicines are reviewed at length with the patient today.  Concerns regarding medicines are outlined above.  No orders of the defined types were placed in this encounter.  No orders of the defined types were placed in this encounter.    Signed, Hilton Cork. Quentin Ore, MD, [facility], [facility] 08/10/2022 9:15 AM    Electrophysiology [facility]

## 2022-08-10 NOTE — Progress Notes (Signed)
Electrophysiology Office Note:    Date:  08/10/2022   ID:  ORVA GWALTNEY, DOB 10/01/1940, MRN [id number]  PCP:  Rochel Brome, MD  [facility] Cardiologist:  Mertie Moores, MD  [facility] Electrophysiologist:  Vickie Epley, MD   Referring MD: Larey Dresser, MD   Chief Complaint: Evaluation of CHF  History of Present Illness:    [name] is a 82 y.o. male who presents for an evaluation of CHF at the request of Dr Aundra Dubin. Their medical history includes CAD s/p CABG in 2003, DM.  01/2022 admitted with weakness/dyspnea--EF<20%. He has very significant MR and was referred to Dr Burt Knack for consideration of MV clip. He was deemed not to be a candidate. He is not a candidate for ICD given age. He is referred to discuss CCM given persistent symptoms.  Today, he is accompanied by her daughter. His wife has recently passed away, which has caused him stress. He reports difficulty gaining his strength back, but has improved some. He reports that his medication has made him feel better. He takes BP regularly in mornings. His HR previously averaged in the 90s in the past, now in the 70s-80s.  He has some occasional SOB on exertion and moving around the house. He has some difficulty taking showers due to limited mobility in his arms.  He denies any chest pain or peripheral edema. No lightheadedness, headaches, syncope, orthopnea, or PND.   He typically takes 4 Metformins a day. However, if he misses a dose on occasion, he doubles up his next dose.      Past Medical History:  Diagnosis Date   Acute exacerbation of CHF (congestive heart failure) ([place]) 09/29/2011   Atherosclerotic heart disease of native coronary artery without angina pectoris 02/12/2010   Qualifier: Diagnosis of  By: Marland Mcalpine     Benign prostatic hyperplasia with lower urinary tract symptoms 12/04/2019   CAD (coronary artery disease)    Hx of    Cellulitis 03/19/2012   Chronic obstructive  pulmonary disease ([place]) 12/04/2019   Diverticulosis of colon (without mention of hemorrhage) 2011   Colonoscopy    Hiatal hernia 2011   EGD   Hypertension    Hypertensive heart disease without heart failure 12/04/2019   ITP (idiopathic thrombocytopenic purpura)    He has been diagnosed with   Pancytopenia    Thrombocytopenia, unspecified ([place]) 01/24/2013   TRANSAMINASES, SERUM, ELEVATED 02/12/2010   Qualifier: Diagnosis of  By: Marland Mcalpine     Type 2 diabetes mellitus with diabetic peripheral angiopathy without gangrene ([place]) 12/04/2019    Past Surgical History:  Procedure Laterality Date   CARDIAC CATHETERIZATION     His last heart catheterization in May of 2009 reveals a patent LIMA   CORONARY ANGIOPLASTY     Successful percutaneous transluminal coronary angioplasty of the left circumflex obuse marginal vessel   CORONARY ANGIOPLASTY WITH STENT PLACEMENT     CORONARY ARTERY BYPASS GRAFT     x4 with a left internal mammary artery anastomosis to the left anterior descending coronary artery    CORONARY STENT PLACEMENT     successful percutaneous transluminal coronary angioplasty and stenting of the left main coronary artery   HERNIA REPAIR     RIGHT/LEFT HEART CATH AND CORONARY/GRAFT ANGIOGRAPHY N/A 01/26/2022   Procedure: RIGHT/LEFT HEART CATH AND CORONARY/GRAFT ANGIOGRAPHY;  Surgeon: Troy Sine, MD;  Location: [facility];  Service: Cardiovascular;  Laterality: N/A;   SAPHENOUS VEIN GRAFT RESECTION  graft to the first obtuse marginal, a saphenous vein graft to the first diagonal coronary artery  and a saphenous vein graft to the distal right coronary artery   TEE WITHOUT CARDIOVERSION N/A 01/28/2022   Procedure: TRANSESOPHAGEAL ECHOCARDIOGRAM (TEE);  Surgeon: Berniece Salines, DO;  Location: [facility];  Service: Cardiovascular;  Laterality: N/A;   US ECHOCARDIOGRAPHY  02-08-2007   Est. EF 40-45%    Current Medications: Current Meds  Medication Sig    cholecalciferol (VITAMIN D3) 25 MCG (1000 UT) tablet Take 5,000 Units by mouth daily.   clopidogrel (PLAVIX) 75 MG tablet Take 1 tablet (75 mg total) by mouth daily.   digoxin (LANOXIN) 0.125 MG tablet Take 1 tablet (0.125 mg total) by mouth daily.   ezetimibe (ZETIA) 10 MG tablet TAKE 1 TABLET BY MOUTH DAILY   Insulin Pen Needle (BD PEN NEEDLE NANO U/F) 32G X 4 MM MISC 1 each by Does not apply route daily.   Lancets MISC 1 each by Does not apply route daily as needed. Please provide lancets that fit patients lancet device   megestrol (MEGACE) 40 MG tablet Take 1 tablet (40 mg total) by mouth daily.   metFORMIN (GLUCOPHAGE) 500 MG tablet Take 1 tablet (500 mg total) by mouth 4 (four) times daily.   metoprolol succinate (TOPROL-XL) 25 MG 24 hr tablet Take 0.5 tablets (12.5 mg total) by mouth daily.   Multiple Vitamin (MULTIVITAMIN) tablet Take 1 tablet by mouth daily.   nitroGLYCERIN (NITROSTAT) 0.4 MG SL tablet Place 1 tablet (0.4 mg total) under the tongue every 5 (five) minutes as needed for chest pain.   ONETOUCH ULTRA test strip AS DIRECTED   Vericiguat (VERQUVO) 2.5 MG TABS Take 2.5 mg by mouth daily.     Allergies:   Lipitor [atorvastatin calcium], Crestor [rosuvastatin calcium], and Zocor [simvastatin]   Social History   Socioeconomic History   Marital status: Married    Spouse name: Doris   Number of children: 3   Years of education: Not on file   Highest education level: High school graduate  Occupational History   Occupation: Retired   Tobacco Use   Smoking status: Never   Smokeless tobacco: Never  Vaping Use   Vaping Use: Never used  Substance and Sexual Activity   Alcohol use: No   Drug use: No   Sexual activity: Not Currently  Other Topics Concern   Not on file  Social History Narrative   Daily caffeine use    Social Determinants of Health   Financial Resource Strain: Low Risk  (01/26/2022)   Overall Financial Resource Strain (CARDIA)    Difficulty of Paying  Living Expenses: Not hard at all  Food Insecurity: No Food Insecurity (06/15/2022)   Hunger Vital Sign    Worried About Running Out of Food in the Last Year: Never true    Ran Out of Food in the Last Year: Never true  Transportation Needs: No Transportation Needs (06/15/2022)   PRAPARE - Hydrologist (Medical): No    Lack of Transportation (Non-Medical): No  Physical Activity: Not on file  Stress: Not on file  Social Connections: Not on file     Family History: The patient's family history includes Cerebrovascular Accident in his mother. There is no history of Colon cancer.  ROS:   Please see the history of present illness.    (+)weakness (+) occasional SOB on exertion All other systems reviewed and are negative.  EKGs/Labs/Other Studies Reviewed:  The following studies were reviewed today:  04/2022 Echo - EF25, severe MR.  01/2022 TEE - severe MR  03/2022 ECG shows narrow QRS.    Recent Labs: 01/24/2022: TSH 2.684 01/29/2022: Magnesium 2.0 03/30/2022: B Natriuretic Peptide 1,014.2 07/21/2022: ALT 14; BUN 19; Creatinine 0.84; Hemoglobin 12.0; Platelet Count 144; Potassium 4.0; Sodium 139  Recent Lipid Panel    Component Value Date/Time   CHOL 157 05/13/2022 0823   TRIG 75 05/13/2022 0823   HDL 56 05/13/2022 0823   CHOLHDL 2.8 05/13/2022 0823   CHOLHDL 2.7 05/11/2016 1002   VLDL 18 05/11/2016 1002   LDLCALC 87 05/13/2022 0823    Physical Exam:    VS:  BP 120/66   Pulse 80   Ht '5\' 9"'$  (1.753 m)   Wt 143 lb (64.9 kg)   SpO2 99%   BMI 21.12 kg/m     Wt Readings from Last 3 Encounters:  08/10/22 143 lb (64.9 kg)  07/21/22 142 lb 9 oz (64.7 kg)  06/12/22 138 lb (62.6 kg)     GEN:  [place], NAD CARDIAC: RRR, 2 out of 6 holosystolic murmur at the left lower sternal border.   RESPIRATORY:  Clear to auscultation without rales, wheezing or rhonchi       ASSESSMENT:    1. Chronic systolic heart failure ([place])   2. Coronary artery  disease due to calcified coronary lesion    PLAN:    In order of problems listed above:  #chronic systolic HF #mod-severe MR #Mod RV dysfunction NYHA II. Warm and relatively euvolemic. No dyspnea. Narrow QRS. Not felt to be a candidate for mitraclip. Not felt ot be a candidate for ICD given age.  I had a long discussion today with the patient and his daughter about CCM therapy.  We discussed the procedural details, the risks and the potential benefits.  Given he has had a positive impact from the recent medication additions (vericiguat), we will continue with medical therapy alone and avoid any further invasive procedures.  He will follow-up with EP on an as-needed basis.  #Coronary artery disease No ischemic symptoms today.  Continue with medical therapy.   Medication Adjustments/Labs and Tests Ordered: Current medicines are reviewed at length with the patient today.  Concerns regarding medicines are outlined above.  No orders of the defined types were placed in this encounter.  No orders of the defined types were placed in this encounter.   I,Mitra Faeizi,acting as a Education administrator for Vickie Epley, MD.,have documented all relevant documentation on the behalf of Vickie Epley, MD,as directed by  Vickie Epley, MD while in the presence of Vickie Epley, MD.  I, Vickie Epley, MD, have reviewed all documentation for this visit. The documentation on 08/10/22 for the exam, diagnosis, procedures, and orders are all accurate and complete.   Signed, Hilton Cork. Quentin Ore, MD, [facility], [facility] 08/10/2022 9:16 AM    Electrophysiology [facility]

## 2022-08-10 NOTE — Patient Instructions (Signed)
Medication Instructions:  Your physician recommends that you continue on your current medications as directed. Please refer to the Current Medication list given to you today.  *If you need a refill on your cardiac medications before your next appointment, please call your pharmacy*  Follow-Up: At [facility], you and your health needs are our priority.  As part of our continuing mission to provide you with exceptional heart care, we have created designated Provider Care Teams.  These Care Teams include your primary Cardiologist (physician) and Advanced Practice Providers (APPs -  Physician Assistants and Nurse Practitioners) who all work together to provide you with the care you need, when you need it.  Your next appointment:   As need with Dr. Quentin Ore

## 2022-08-18 NOTE — Assessment & Plan Note (Signed)
Check labs 

## 2022-08-18 NOTE — Assessment & Plan Note (Signed)
Continue zetia. Intolerant to statins.  Continue to work on eating a healthy diet and exercise.  Labs drawn today.

## 2022-08-18 NOTE — Assessment & Plan Note (Addendum)
Severe. EF 20%. Moderate MR.  Management per specialist, but  No changes to medicines. On vericiquat 2.5 mg, toprol xl 25 mg daily, and digoxin 0.125 mg daily. Continue to work on eating a healthy diet and exercise.  Labs drawn today.

## 2022-08-18 NOTE — Progress Notes (Addendum)
Primary Care: Rochel Brome, MD HF Cardiology: Dr. Aundra Dubin  HPI: Patient referred to HF clinic from transition of care clinic for evaluation of CHF.   82 y.o. male with history of CAD s/p CABG in 2003 with subsequent PCI in 2007, DM II, chronic systolic CHF. He has been followed by Dr. Acie Fredrickson in the Cardiology clinic. Echo in 6/22 showed EF 50-55%, RV okay, RVSP normal, mild to moderate MR  He was admitted on 01/24/22 with progressive weakness and dyspnea. Echo with EF < 20%, RV mildly reduced, RVSP 80 mmHg, moderate to severe MR with concern for torn chord, mild to moderate TR.  [facility] 01/26/22 showed LIMA to LAD patent, patent SVG to distal RCA, occluded SVG to diagonal, SVG to Cx is occluded, RA mean 6, PA 41/23 (30), PCWP mean 21 (v 28), PA sat 58%, Fick CO/CI 4.5/2.6, Thermo CO/CI 3.4/1.9.  No targets for intervention on LHC.  TEE 01/28/22 showed EF < 20%, moderate to severe functional MR, highly mobile echogenic mass appears to be papillary muscle, RV moderately reduced.  He was diuresed with IV lasix. Difficulty tolerating GDMT due to low blood pressures.   He was seen by Dr. Burt Knack for consideration of mTEER.    He was started on cardiac medications, but subsequently, spironolactone, Jardiance, and losartan were all stopped due to hypotension and orthostasis.  Patient feels like the problems really started when he began [name].   He followed up with Dr. Burt Knack, and it was decided not to proceed with mTEER, moderate-severe MR and unclear how much his fatigue would improve with MR treatment.   Echo in 10/23 showed EF 25-30%, moderate LV dilation, normal RV, mod-severe ischemic MR, normal IVC.   Follow up 11/23, remained weak with NYHA III symptoms. Vericiguat 2.5 mg added. Seen in ED 06/13/22 after sustaining a mechanical fall. He hit his head but did not have LOC. CT head showed no large skull fracture of ICH. Discharged home.  Follow up (1/24) with Dr. Quentin Ore to discuss CCM. He had  improvement in symptoms by addition of vericiguat and plan will be to continue medical therapy, and avoid invasive procedures.  Today he returns for HF follow up with his daughter. His wife passed last month. He remains dizzy and had 3 falls this past week. He lives alone. He has SOB walking quickly around the house. He had an episode of quick shooting pain up left side of abdomen to left shoulder, resolved spontaneously and he did not take Nitro. Denies palpitations, abnormal bleeding, edema, or PND/Orthopnea. Appetite ok. No fever or chills. Weight at home 135-138 pounds. Taking all medications. Children stay with him at nights Monday-Thursday. Saw PCP yesterday, who is arranging PT and assistance at home. BP at home 110-130/60-80s.  ECG (personally reviewed): NSR with PACs, 97 bpm  Labs (723): LDL 85 Labs (8/23): K 4.3, creatinine 1.19 Labs (11/23): K 4.3, creatinine 0.84, LDL 87 Labs (1/24): K 4, creatinine 0.84  Review of Systems: All systems reviewed and negative except as per HPI.   PMH: 1. BPH 2. COPD 3. HTN 4. H/o ITP 5. Type 2 diabetes 6. Hyperlipidemia 7. CAD: S/p CABG.  - LHC 01/26/22 showed LIMA to LAD patent, patent SVG to distal RCA, occluded SVG to diagonal, SVG to Cx is occluded 8. Chronic systolic CHF: Ischemic cardiomyopathy.  - Echo (7/23): EF < 20%, RV mildly reduced, RVSP 80 mmHg, moderate to severe MR with concern for torn chord, mild to moderate TR. - RHC (7/23):  RA  mean 6, PA 41/23 (30), PCWP mean 21 (v 28), PA sat 58%, Fick CO/CI 4.5/2.6, Thermo CO/CI 3.4/1.9 - Echo (10/23): EF 25-30%, moderate LV dilation, normal RV, mod-severe ischemic MR, normal IVC.  9. Mitral regurgitation: Functional.  - TEE 01/28/22 showed EF < 20%, moderate to severe functional MR, highly mobile echogenic mass appears to be papillary muscle, RV moderately reduced.   Current Outpatient Medications  Medication Sig Dispense Refill   cholecalciferol (VITAMIN D3) 25 MCG (1000 UT) tablet  Take 5,000 Units by mouth daily.     clopidogrel (PLAVIX) 75 MG tablet Take 1 tablet (75 mg total) by mouth daily. 90 tablet 3   digoxin (LANOXIN) 0.125 MG tablet Take 1 tablet (0.125 mg total) by mouth daily. 90 tablet 3   ezetimibe (ZETIA) 10 MG tablet TAKE 1 TABLET BY MOUTH DAILY 90 tablet 1   Insulin Pen Needle (BD PEN NEEDLE NANO U/F) 32G X 4 MM MISC 1 each by Does not apply route daily. 100 each 3   Lancets MISC 1 each by Does not apply route daily as needed. Please provide lancets that fit patients lancet device 100 each 4   megestrol (MEGACE) 40 MG tablet Take 1 tablet (40 mg total) by mouth daily. 30 tablet 2   metFORMIN (GLUCOPHAGE) 500 MG tablet Take 1 tablet (500 mg total) by mouth 4 (four) times daily. 360 tablet 1   metoprolol succinate (TOPROL-XL) 25 MG 24 hr tablet Take 0.5 tablets (12.5 mg total) by mouth daily. 30 tablet 2   Multiple Vitamin (MULTIVITAMIN) tablet Take 1 tablet by mouth daily.     nitroGLYCERIN (NITROSTAT) 0.4 MG SL tablet Place 1 tablet (0.4 mg total) under the tongue every 5 (five) minutes as needed for chest pain. 90 tablet 3   ONETOUCH ULTRA test strip AS DIRECTED 100 each 4   Vericiguat (VERQUVO) 2.5 MG TABS Take 2.5 mg by mouth daily. 30 tablet 11   No current facility-administered medications for this encounter.   Allergies  Allergen Reactions   Lipitor [Atorvastatin Calcium] Other (See Comments)    Causes memory loss   Crestor [Rosuvastatin Calcium] Other (See Comments)    Causes Muscle Pain   Zocor [Simvastatin] Other (See Comments)    Causes muscle pain   Social History   Socioeconomic History   Marital status: Married    Spouse name: Doris   Number of children: 3   Years of education: Not on file   Highest education level: High school graduate  Occupational History   Occupation: Retired   Tobacco Use   Smoking status: Never   Smokeless tobacco: Never  Vaping Use   Vaping Use: Never used  Substance and Sexual Activity   Alcohol use:  No   Drug use: No   Sexual activity: Not Currently  Other Topics Concern   Not on file  Social History Narrative   Daily caffeine use    Social Determinants of Health   Financial Resource Strain: Low Risk  (01/26/2022)   Overall Financial Resource Strain (CARDIA)    Difficulty of Paying Living Expenses: Not hard at all  Food Insecurity: No Food Insecurity (06/15/2022)   Hunger Vital Sign    Worried About Running Out of Food in the Last Year: Never true    [place] in the Last Year: Never true  Transportation Needs: No Transportation Needs (06/15/2022)   PRAPARE - Hydrologist (Medical): No    Lack of Transportation (Non-Medical):  No  Physical Activity: Sufficiently Active (08/19/2022)   Exercise Vital Sign    Days of Exercise per Week: 7 days    Minutes of Exercise per Session: 30 min  Stress: No Stress Concern Present (08/19/2022)   [place]    Feeling of Stress : Not at all  Social Connections: Moderately Isolated (08/19/2022)   Social Connection and Isolation Panel [NHANES]    Frequency of Communication with Friends and Family: Three times a week    Frequency of Social Gatherings with Friends and Family: Three times a week    Attends Religious Services: More than 4 times per year    Active Member of Clubs or Organizations: No    Attends Archivist Meetings: Never    Marital Status: Widowed  Intimate Partner Violence: Not At Risk (08/19/2022)   Humiliation, Afraid, Rape, and Kick questionnaire    Fear of Current or Ex-Partner: No    Emotionally Abused: No    Physically Abused: No    Sexually Abused: No   Family History  Problem Relation Age of Onset   Cerebrovascular Accident Mother    Colon cancer Neg Hx    BP (!) 142/80   Pulse 89   Wt 64.4 kg (142 lb)   SpO2 100%   BMI 20.97 kg/m   Wt Readings from Last 3 Encounters:  08/21/22 64.4 kg (142 lb)  08/19/22  64 kg (141 lb)  08/10/22 64.9 kg (143 lb)   PHYSICAL EXAM: General:  NAD. No resp difficulty, arrived in [facility], frail, elderly HEENT: Normal Neck: Supple. No JVD. Carotids 2+ bilat; no bruits. No lymphadenopathy or thryomegaly appreciated. Cor: PMI nondisplaced. Regular rate & rhythm. No rubs, gallops or murmurs. Lungs: Clear Abdomen: Soft, nontender, nondistended. No hepatosplenomegaly. No bruits or masses. Good bowel sounds. Extremities: No cyanosis, clubbing, rash, edema Neuro: Alert & oriented x 3, cranial nerves grossly intact. Moves all 4 extremities w/o difficulty. Affect pleasant.  ASSESSMENT & PLAN: 1. Chronic systolic CHF: Ischemic cardiomyopathy. Echo 7/23 showed EF < 20%, RV mildly reduced, RVSP 80 mmHg, moderate to severe MR, mild to moderate TR.  R/LHC in 7/23 showed LIMA to LAD patent, patent SVG to distal RCA, occluded SVG to diagonal, SVG to Cx is occluded, mildly elevated PCWP, Fick CO/CI 4.5/2.6, Thermo CO/CI 3.4/1.9.  No targets for intervention on LHC.  TEE in 7/23 showed EF < 20%, moderate to severe functional MR, highly mobile echogenic mass appears to be papillary muscle, RV moderately reduced. Echo in 10/23 showed EF 25-30%, moderate LV dilation, normal RV, mod-severe ischemic MR, normal IVC.  GDMT has been limited by hypotension/orthostasis. NYHA class III symptoms, more limited by fatigue/weakness than dyspnea. He is not volume overloaded. - Continue Toprol XL 12.5 mg but change to qhs. - Continue digoxin 0.125 mg daily. Check dig level today. - Continue vericiguat 2.5 mg daily. BMET/BNP today.  - He seems to be intolerant of Jardiance.  - Unable to tolerate low dose spironolactone.  - I suspect he would not tolerate ARB/ACEI/ARNI.  - Narrow QRS, not CRT candidate.  Given age, would not suggest primary prevention ICD.  - He has seen EP to discuss CCM. Plan to continue medical management and defer invasive procedures.  2. Mitral regurgitation: TEE in 7/23 suggests  moderate-severe MR, likely functional.  Seen by structural heart team during 7/23 admit. It was recommended to optimize GDMT for HF and repeat echo in 2-3 months. Echo in 10/23 showed  moderate-severe MR.  He saw Dr. Burt Knack and they decided against mTEER due to frailty and uncertainty that he would benefit significantly.  3. CAD: Hx CABG in 2003 and PCI in 2007.  LHC 07/23 with LIMA to LAD patent, patent SVG to distal RCA, occluded SVG to diagonal, SVG to Cx is occluded. No targets for intervention. No ischemic chest pain.  - Continue Plavix 75 mg daily.  - Did not tolerate statins due to myalgias and memory loss, has been on Zetia. Recent LDL 87. Consider lipid clinic referral for [place].  4. Frequent Falls: Worrisome. These appear to occur w/o prodrome and have been on-going since at least 05/2022. BP has been stable at home.  - Place 2 week Zio to evaluate for arrhythmia that could explain dizziness. - Discussed wearing compression hose. - May need to stop vericiguat, but BP has been well controlled. - We discussed fall precautions and ED evaluation should he fall. 5. GOC: physical decline over past few months, also appears to have component of FTT, understandably so after his wife of 55 years recently passed. Discussed the role of Palliative care to address [place]. He and daughter are agreeable. Will refer today.  Follow up in 4 months with Dr. Aundra Dubin.  Maricela [facility] FNP-BC 08/21/2022

## 2022-08-18 NOTE — Assessment & Plan Note (Signed)
On no inhalers.

## 2022-08-18 NOTE — Assessment & Plan Note (Addendum)
Control: well control Recommend check sugars fasting daily. Recommend check feet daily. Recommend annual eye exams. Medicines: Decrease metformin 500 mg one pill before breakfast and supper.  Continue to work on eating a healthy diet and exercise.  Labs drawn today.

## 2022-08-18 NOTE — Progress Notes (Signed)
Subjective:  Patient ID: [name], male    DOB: 10-Jan-1941  Age: 82 y.o. MRN: [id number]  Chief Complaint  Patient presents with   Diabetes   Hyperlipidemia    HPI  Diabetes: dxd around age 55 yo.  Complications: polyneuropathy Checks sugars daily. Glucose log: 97-139 No hypoglycemia. Most recent A1C: 6.1% Current medications: Metformin 500 mg four times a day  Last Eye Exam: 11/2021 Foot checks: yes  Hyperlipidemia: Current medications: Zetia 10 mg daily. Intolerant to statins.   Hypertensive heart disease/CHF: history of MI. CABG in 2003. Had a stent place in the interem. Intolerant to some of the heart medicines (jardiance and spironolactone) because his bp dropped too low. He is officially following with Dr. Aundra Dubin at the heart failure clinic. Checks sugars daily. On vericiquat 2.5 mg, toprol xl 25 mg daily, digoxin 0.125 mg daily,  Aortic valve sclerosis is present, with no evidence of aortic valve  stenosis. Complications: Diabetes, hyperlipidemia Current medications: Metoprolol 25 mg 1/2 daily. Plavix, zetia. Intolerant to statins.  Diet: healthy. High protein. On megace.   ITP: platelets stable.      08/19/2022    8:25 AM 05/13/2022    1:02 PM 05/13/2021    8:13 AM 04/15/2020    8:14 AM  Depression screen PHQ 2/9  Decreased Interest 0 0 0 0  Down, Depressed, Hopeless 0 0 0 0  PHQ - 2 Score 0 0 0 0         01/28/2022    8:00 PM 01/29/2022    1:00 PM 06/12/2022   11:32 AM 06/15/2022   12:48 PM 08/19/2022    8:25 AM  Fall Risk  Falls in the past year?    1 0  Was there an injury with Fall?    1 0  Fall Risk Category Calculator    3 0  Fall Risk Category (Retired)    High   (RETIRED) Patient Fall Risk Level High fall risk Moderate fall risk High fall risk High fall risk   Patient at Risk for Falls Due to    History of fall(s);Impaired mobility;Medication side effect No Fall Risks  Fall risk Follow up    Falls prevention discussed;Education provided Falls  evaluation completed      Review of Systems  Constitutional:  Negative for chills, fatigue, fever and unexpected weight change.  HENT:  Negative for congestion, ear pain, sinus pain and sore throat.   Cardiovascular:  Negative for chest pain and palpitations.  Gastrointestinal:  Negative for abdominal pain, blood in stool, constipation, diarrhea, nausea and vomiting.  Endocrine: Negative for polydipsia.  Genitourinary:  Negative for dysuria.  Musculoskeletal:  Negative for back pain.  Skin:  Negative for rash.  Neurological:  Negative for headaches.  Psychiatric/Behavioral:  Negative for dysphoric mood. The patient is not nervous/anxious.     Current Outpatient Medications on File Prior to Visit  Medication Sig Dispense Refill   cholecalciferol (VITAMIN D3) 25 MCG (1000 UT) tablet Take 5,000 Units by mouth daily.     clopidogrel (PLAVIX) 75 MG tablet Take 1 tablet (75 mg total) by mouth daily. 90 tablet 3   digoxin (LANOXIN) 0.125 MG tablet Take 1 tablet (0.125 mg total) by mouth daily. 90 tablet 3   ezetimibe (ZETIA) 10 MG tablet TAKE 1 TABLET BY MOUTH DAILY 90 tablet 1   Insulin Pen Needle (BD PEN NEEDLE NANO U/F) 32G X 4 MM MISC 1 each by Does not apply route daily. 100 each 3  Lancets MISC 1 each by Does not apply route daily as needed. Please provide lancets that fit patients lancet device 100 each 4   megestrol (MEGACE) 40 MG tablet Take 1 tablet (40 mg total) by mouth daily. 30 tablet 2   metFORMIN (GLUCOPHAGE) 500 MG tablet Take 1 tablet (500 mg total) by mouth 4 (four) times daily. 360 tablet 1   Multiple Vitamin (MULTIVITAMIN) tablet Take 1 tablet by mouth daily.     nitroGLYCERIN (NITROSTAT) 0.4 MG SL tablet Place 1 tablet (0.4 mg total) under the tongue every 5 (five) minutes as needed for chest pain. 90 tablet 3   ONETOUCH ULTRA test strip AS DIRECTED 100 each 4   Vericiguat (VERQUVO) 2.5 MG TABS Take 2.5 mg by mouth daily. 30 tablet 11   No current facility-administered  medications on file prior to visit.   Past Medical History:  Diagnosis Date   Acute exacerbation of CHF (congestive heart failure) ([place]) 09/29/2011   Atherosclerotic heart disease of native coronary artery without angina pectoris 02/12/2010   Qualifier: Diagnosis of  By: Marland Mcalpine     Benign prostatic hyperplasia with lower urinary tract symptoms 12/04/2019   CAD (coronary artery disease)    Hx of    Cellulitis 03/19/2012   Chronic obstructive pulmonary disease ([place]) 12/04/2019   Diverticulosis of colon (without mention of hemorrhage) 2011   Colonoscopy    Hiatal hernia 2011   EGD   Hypertension    Hypertensive heart disease without heart failure 12/04/2019   ITP (idiopathic thrombocytopenic purpura)    He has been diagnosed with   Pancytopenia    Thrombocytopenia, unspecified ([place]) 01/24/2013   TRANSAMINASES, SERUM, ELEVATED 02/12/2010   Qualifier: Diagnosis of  By: Marland Mcalpine     Type 2 diabetes mellitus with diabetic peripheral angiopathy without gangrene ([place]) 12/04/2019   Past Surgical History:  Procedure Laterality Date   CARDIAC CATHETERIZATION     His last heart catheterization in May of 2009 reveals a patent LIMA   CORONARY ANGIOPLASTY     Successful percutaneous transluminal coronary angioplasty of the left circumflex obuse marginal vessel   CORONARY ANGIOPLASTY WITH STENT PLACEMENT     CORONARY ARTERY BYPASS GRAFT     x4 with a left internal mammary artery anastomosis to the left anterior descending coronary artery    CORONARY STENT PLACEMENT     successful percutaneous transluminal coronary angioplasty and stenting of the left main coronary artery   HERNIA REPAIR     RIGHT/LEFT HEART CATH AND CORONARY/GRAFT ANGIOGRAPHY N/A 01/26/2022   Procedure: RIGHT/LEFT HEART CATH AND CORONARY/GRAFT ANGIOGRAPHY;  Surgeon: Troy Sine, MD;  Location: [facility];  Service: Cardiovascular;  Laterality: N/A;   SAPHENOUS VEIN GRAFT RESECTION     graft  to the first obtuse marginal, a saphenous vein graft to the first diagonal coronary artery  and a saphenous vein graft to the distal right coronary artery   TEE WITHOUT CARDIOVERSION N/A 01/28/2022   Procedure: TRANSESOPHAGEAL ECHOCARDIOGRAM (TEE);  Surgeon: Berniece Salines, DO;  Location: [facility];  Service: Cardiovascular;  Laterality: N/A;   US ECHOCARDIOGRAPHY  02-08-2007   Est. EF 40-45%    Family History  Problem Relation Age of Onset   Cerebrovascular Accident Mother    Colon cancer Neg Hx    Social History   Socioeconomic History   Marital status: Married    Spouse name: Doris   Number of children: 3   Years of education: Not on file  Highest education level: High school graduate  Occupational History   Occupation: Retired   Tobacco Use   Smoking status: Never   Smokeless tobacco: Never  Vaping Use   Vaping Use: Never used  Substance and Sexual Activity   Alcohol use: No   Drug use: No   Sexual activity: Not Currently  Other Topics Concern   Not on file  Social History Narrative   Daily caffeine use    Social Determinants of Health   Financial Resource Strain: Low Risk  (01/26/2022)   Overall Financial Resource Strain (CARDIA)    Difficulty of Paying Living Expenses: Not hard at all  Food Insecurity: No Food Insecurity (06/15/2022)   Hunger Vital Sign    Worried About Running Out of Food in the Last Year: Never true    Ran Out of Food in the Last Year: Never true  Transportation Needs: No Transportation Needs (06/15/2022)   PRAPARE - Hydrologist (Medical): No    Lack of Transportation (Non-Medical): No  Physical Activity: Sufficiently Active (08/19/2022)   Exercise Vital Sign    Days of Exercise per Week: 7 days    Minutes of Exercise per Session: 30 min  Stress: No Stress Concern Present (08/19/2022)   [place]    Feeling of Stress : Not at all  Social Connections:  Moderately Isolated (08/19/2022)   Social Connection and Isolation Panel [NHANES]    Frequency of Communication with Friends and Family: Three times a week    Frequency of Social Gatherings with Friends and Family: Three times a week    Attends Religious Services: More than 4 times per year    Active Member of Clubs or Organizations: No    Attends Archivist Meetings: Never    Marital Status: Widowed    Objective:  BP 122/70   Pulse 76   Temp 97.6 F (36.4 C)   Ht 5' 9"$  (1.753 m)   Wt 141 lb (64 kg)   SpO2 99%   BMI 20.82 kg/m      08/21/2022    9:46 AM 08/19/2022    8:19 AM 08/10/2022    8:53 AM  BP/Weight  Systolic BP [id number]  Diastolic BP 80 70 66  Wt. (Lbs) 142 141 143  BMI 20.97 kg/m2 20.82 kg/m2 21.12 kg/m2    Physical Exam Constitutional:      Appearance: Normal appearance.  Neck:     Vascular: No carotid bruit.  Cardiovascular:     Rate and Rhythm: Normal rate. Rhythm irregular.     Heart sounds: Normal heart sounds.  Pulmonary:     Effort: Pulmonary effort is normal.     Breath sounds: Normal breath sounds.  Abdominal:     General: Bowel sounds are normal.     Palpations: Abdomen is soft. There is no mass.     Tenderness: There is no abdominal tenderness.  Neurological:     Mental Status: He is alert and oriented to person, place, and time.     Gait: Gait abnormal (shuffling gait. using walker.).  Psychiatric:        Mood and Affect: Mood normal.        Behavior: Behavior normal.     Diabetic Foot Exam - Simple   No data filed      Lab Results  Component Value Date   WBC 6.1 08/21/2022   HGB 11.6 (L) 08/21/2022  HCT 32.3 (L) 08/21/2022   PLT 138 (L) 08/21/2022   GLUCOSE 128 (H) 08/21/2022   CHOL 142 08/19/2022   TRIG 61 08/19/2022   HDL 55 08/19/2022   LDLCALC 74 08/19/2022   ALT 15 08/19/2022   AST 24 08/19/2022   NA 136 08/21/2022   K 3.6 08/21/2022   CL 103 08/21/2022   CREATININE 0.90 08/21/2022   BUN 13 08/21/2022    CO2 21 (L) 08/21/2022   TSH 2.684 01/24/2022   INR 1.3 (H) 03/24/2011   HGBA1C 6.2 (H) 08/19/2022   MICROALBUR 10 05/13/2021      Assessment & Plan:    Hypertensive heart disease with other congestive heart failure ([place]) Assessment & Plan: Severe. EF 20%. Moderate MR.  Management per specialist, but  No changes to medicines. On vericiquat 2.5 mg, toprol xl 25 mg daily, and digoxin 0.125 mg daily. Continue to work on eating a healthy diet and exercise.  Labs drawn today.   Orders: -     Comprehensive metabolic panel -     CBC with Differential/Platelet  Simple chronic bronchitis ([place]) Assessment & Plan: On no inhalers.   Diabetic polyneuropathy associated with type 2 diabetes mellitus ([place]) Assessment & Plan: Control: well control Recommend check sugars fasting daily. Recommend check feet daily. Recommend annual eye exams. Medicines: Decrease metformin 500 mg one pill before breakfast and supper.  Continue to work on eating a healthy diet and exercise.  Labs drawn today.     Orders: -     Hemoglobin A1c -     Microalbumin / creatinine urine ratio  Benign prostatic hyperplasia with lower urinary tract symptoms, symptom details unspecified Assessment & Plan: Check labs   Mixed hyperlipidemia Assessment & Plan: Continue zetia. Intolerant to statins.  Continue to work on eating a healthy diet and exercise.  Labs drawn today.   Orders: -     Lipid panel  Coronary artery disease of native artery of native heart with stable angina pectoris [facility]) Assessment & Plan: Continue toprol xl 25 mg 1/2 daily, plavix 75 mg qd, zetia 10 mg daily.  Management per specialist.     Idiopathic thrombocytopenic purpura ([place]) Assessment & Plan: Platelets stable.   Muscular deconditioning  Falls frequently Assessment & Plan: Refer to home health care.    Statin myopathy Assessment & Plan: Intolerant to statins.    Other orders -     Cardiovascular Risk  Assessment     No orders of the defined types were placed in this encounter.   Orders Placed This Encounter  Procedures   Comprehensive metabolic panel   Hemoglobin A1c   Lipid panel   CBC with Differential/Platelet   Microalbumin / creatinine urine ratio   Cardiovascular Risk Assessment     Follow-up: Return in about 3 months (around 11/17/2022) for chronic fasting.  An After Visit Summary was printed and given to the patient.   Geralynn Ochs I Leal-Borjas,acting as a scribe for Rochel Brome, MD.,have documented all relevant documentation on the behalf of Rochel Brome, MD,as directed by  Rochel Brome, MD while in the presence of Rochel Brome, MD.   I attest that I have reviewed this visit and agree with the plan scribed by my staff.   Rochel Brome, MD [facility] [phone]

## 2022-08-19 ENCOUNTER — Ambulatory Visit (INDEPENDENT_AMBULATORY_CARE_PROVIDER_SITE_OTHER): Payer: Medicare HMO | Admitting: Family Medicine

## 2022-08-19 ENCOUNTER — Encounter: Payer: Self-pay | Admitting: Family Medicine

## 2022-08-19 VITALS — BP 122/70 | HR 76 | Temp 97.6°F | Ht 69.0 in | Wt 141.0 lb

## 2022-08-19 DIAGNOSIS — J410 Simple chronic bronchitis: Secondary | ICD-10-CM

## 2022-08-19 DIAGNOSIS — R296 Repeated falls: Secondary | ICD-10-CM

## 2022-08-19 DIAGNOSIS — I25118 Atherosclerotic heart disease of native coronary artery with other forms of angina pectoris: Secondary | ICD-10-CM | POA: Diagnosis not present

## 2022-08-19 DIAGNOSIS — D693 Immune thrombocytopenic purpura: Secondary | ICD-10-CM

## 2022-08-19 DIAGNOSIS — T466X5A Adverse effect of antihyperlipidemic and antiarteriosclerotic drugs, initial encounter: Secondary | ICD-10-CM | POA: Diagnosis not present

## 2022-08-19 DIAGNOSIS — E782 Mixed hyperlipidemia: Secondary | ICD-10-CM | POA: Diagnosis not present

## 2022-08-19 DIAGNOSIS — E1142 Type 2 diabetes mellitus with diabetic polyneuropathy: Secondary | ICD-10-CM

## 2022-08-19 DIAGNOSIS — N401 Enlarged prostate with lower urinary tract symptoms: Secondary | ICD-10-CM

## 2022-08-19 DIAGNOSIS — I110 Hypertensive heart disease with heart failure: Secondary | ICD-10-CM

## 2022-08-19 DIAGNOSIS — R29898 Other symptoms and signs involving the musculoskeletal system: Secondary | ICD-10-CM

## 2022-08-19 DIAGNOSIS — G720 Drug-induced myopathy: Secondary | ICD-10-CM

## 2022-08-19 DIAGNOSIS — I5089 Other heart failure: Secondary | ICD-10-CM | POA: Diagnosis not present

## 2022-08-19 NOTE — Patient Instructions (Addendum)
Decrease metformin 500 mg one pill before breakfast and supper.

## 2022-08-20 ENCOUNTER — Encounter: Payer: Self-pay | Admitting: Family Medicine

## 2022-08-21 ENCOUNTER — Inpatient Hospital Stay (HOSPITAL_COMMUNITY)
Admission: RE | Admit: 2022-08-21 | Discharge: 2022-08-21 | Disposition: A | Discharge status: Home / Self Care | Payer: Medicare HMO | Source: Ambulatory Visit | Attending: Family Medicine | Admitting: Family Medicine

## 2022-08-21 ENCOUNTER — Ambulatory Visit (HOSPITAL_COMMUNITY)
Admission: RE | Admit: 2022-08-21 | Discharge: 2022-08-21 | Disposition: A | Discharge status: Home / Self Care | Payer: Medicare HMO | Source: Ambulatory Visit | Attending: Family Medicine | Admitting: Family Medicine

## 2022-08-21 ENCOUNTER — Encounter (HOSPITAL_COMMUNITY): Payer: Self-pay

## 2022-08-21 VITALS — BP 142/80 | HR 89 | Wt 142.0 lb

## 2022-08-21 DIAGNOSIS — J449 Chronic obstructive pulmonary disease, unspecified: Secondary | ICD-10-CM | POA: Insufficient documentation

## 2022-08-21 DIAGNOSIS — I5022 Chronic systolic (congestive) heart failure: Secondary | ICD-10-CM | POA: Diagnosis not present

## 2022-08-21 DIAGNOSIS — Z7902 Long term (current) use of antithrombotics/antiplatelets: Secondary | ICD-10-CM | POA: Insufficient documentation

## 2022-08-21 DIAGNOSIS — I2584 Coronary atherosclerosis due to calcified coronary lesion: Secondary | ICD-10-CM

## 2022-08-21 DIAGNOSIS — I110 Hypertensive heart disease with heart failure: Secondary | ICD-10-CM | POA: Diagnosis not present

## 2022-08-21 DIAGNOSIS — R413 Other amnesia: Secondary | ICD-10-CM | POA: Diagnosis not present

## 2022-08-21 DIAGNOSIS — Z79899 Other long term (current) drug therapy: Secondary | ICD-10-CM | POA: Insufficient documentation

## 2022-08-21 DIAGNOSIS — R54 Age-related physical debility: Secondary | ICD-10-CM | POA: Diagnosis not present

## 2022-08-21 DIAGNOSIS — Z7189 Other specified counseling: Secondary | ICD-10-CM

## 2022-08-21 DIAGNOSIS — I255 Ischemic cardiomyopathy: Secondary | ICD-10-CM | POA: Insufficient documentation

## 2022-08-21 DIAGNOSIS — E119 Type 2 diabetes mellitus without complications: Secondary | ICD-10-CM | POA: Diagnosis not present

## 2022-08-21 DIAGNOSIS — I951 Orthostatic hypotension: Secondary | ICD-10-CM | POA: Diagnosis not present

## 2022-08-21 DIAGNOSIS — R42 Dizziness and giddiness: Secondary | ICD-10-CM

## 2022-08-21 DIAGNOSIS — M791 Myalgia, unspecified site: Secondary | ICD-10-CM | POA: Diagnosis not present

## 2022-08-21 DIAGNOSIS — I2510 Atherosclerotic heart disease of native coronary artery without angina pectoris: Secondary | ICD-10-CM | POA: Diagnosis not present

## 2022-08-21 DIAGNOSIS — I340 Nonrheumatic mitral (valve) insufficiency: Secondary | ICD-10-CM | POA: Diagnosis not present

## 2022-08-21 DIAGNOSIS — R296 Repeated falls: Secondary | ICD-10-CM

## 2022-08-21 DIAGNOSIS — I25810 Atherosclerosis of coronary artery bypass graft(s) without angina pectoris: Secondary | ICD-10-CM | POA: Diagnosis not present

## 2022-08-21 LAB — COMPREHENSIVE METABOLIC PANEL
ALT: 15 IU/L (ref 0–44)
AST: 24 IU/L (ref 0–40)
Albumin/Globulin Ratio: 1.5 (ref 1.2–2.2)
Albumin: 4.2 g/dL (ref 3.7–4.7)
Alkaline Phosphatase: 48 IU/L (ref 44–121)
BUN/Creatinine Ratio: 17 (ref 10–24)
BUN: 15 mg/dL (ref 8–27)
Bilirubin Total: 1.2 mg/dL (ref 0.0–1.2)
CO2: 20 mmol/L (ref 20–29)
Calcium: 9.6 mg/dL (ref 8.6–10.2)
Chloride: 102 mmol/L (ref 96–106)
Creatinine, Ser: 0.9 mg/dL (ref 0.76–1.27)
Globulin, Total: 2.8 g/dL (ref 1.5–4.5)
Glucose: 109 mg/dL — ABNORMAL HIGH (ref 70–99)
Potassium: 4.5 mmol/L (ref 3.5–5.2)
Sodium: 136 mmol/L (ref 134–144)
Total Protein: 7 g/dL (ref 6.0–8.5)
eGFR: 85 mL/min/{1.73_m2} (ref >59)

## 2022-08-21 LAB — LIPID PANEL
Chol/HDL Ratio: 2.6 ratio (ref 0.0–5.0)
Cholesterol, Total: 142 mg/dL (ref 100–199)
HDL: 55 mg/dL (ref >39)
LDL Chol Calc (NIH): 74 mg/dL (ref 0–99)
Triglycerides: 61 mg/dL (ref 0–149)
VLDL Cholesterol Cal: 13 mg/dL (ref 5–40)

## 2022-08-21 LAB — CBC WITH DIFFERENTIAL/PLATELET
Basophils Absolute: 0 10*3/uL (ref 0.0–0.2)
Basos: 0 %
EOS (ABSOLUTE): 0.2 10*3/uL (ref 0.0–0.4)
Eos: 2 %
Hematocrit: 34.5 % — ABNORMAL LOW (ref 37.5–51.0)
Hemoglobin: 12 g/dL — ABNORMAL LOW (ref 13.0–17.7)
Immature Grans (Abs): 0 10*3/uL (ref 0.0–0.1)
Immature Granulocytes: 0 %
Lymphocytes Absolute: 1.7 10*3/uL (ref 0.7–3.1)
Lymphs: 23 %
MCH: 32.8 pg (ref 26.6–33.0)
MCHC: 34.8 g/dL (ref 31.5–35.7)
MCV: 94 fL (ref 79–97)
Monocytes Absolute: 0.7 10*3/uL (ref 0.1–0.9)
Monocytes: 9 %
Neutrophils Absolute: 4.9 10*3/uL (ref 1.4–7.0)
Neutrophils: 66 %
Platelets: 161 10*3/uL (ref 150–450)
RBC: 3.66 x10E6/uL — ABNORMAL LOW (ref 4.14–5.80)
RDW: 11.7 % (ref 11.6–15.4)
WBC: 7.4 10*3/uL (ref 3.4–10.8)

## 2022-08-21 LAB — CBC
HCT: 32.3 % — ABNORMAL LOW (ref 39.0–52.0)
Hemoglobin: 11.6 g/dL — ABNORMAL LOW (ref 13.0–17.0)
MCH: 34.3 pg — ABNORMAL HIGH (ref 26.0–34.0)
MCHC: 35.9 g/dL (ref 30.0–36.0)
MCV: 95.6 fL (ref 80.0–100.0)
Platelets: 138 10*3/uL — ABNORMAL LOW (ref 150–400)
RBC: 3.38 MIL/uL — ABNORMAL LOW (ref 4.22–5.81)
RDW: 12.7 % (ref 11.5–15.5)
WBC: 6.1 10*3/uL (ref 4.0–10.5)
nRBC: 0 % (ref 0.0–0.2)

## 2022-08-21 LAB — BASIC METABOLIC PANEL
Anion gap: 12 (ref 5–15)
BUN: 13 mg/dL (ref 8–23)
CO2: 21 mmol/L — ABNORMAL LOW (ref 22–32)
Calcium: 9.4 mg/dL (ref 8.9–10.3)
Chloride: 103 mmol/L (ref 98–111)
Creatinine, Ser: 0.9 mg/dL (ref 0.61–1.24)
GFR, Estimated: >60 mL/min (ref >60)
Glucose, Bld: 128 mg/dL — ABNORMAL HIGH (ref 70–99)
Potassium: 3.6 mmol/L (ref 3.5–5.1)
Sodium: 136 mmol/L (ref 135–145)

## 2022-08-21 LAB — BRAIN NATRIURETIC PEPTIDE: B Natriuretic Peptide: 1224.1 pg/mL — ABNORMAL HIGH (ref 0.0–100.0)

## 2022-08-21 LAB — CARDIOVASCULAR RISK ASSESSMENT

## 2022-08-21 LAB — MICROALBUMIN / CREATININE URINE RATIO
Creatinine, Urine: 122.8 mg/dL
Microalb/Creat Ratio: 7 mg/g creat (ref 0–29)
Microalbumin, Urine: 8.4 ug/mL

## 2022-08-21 LAB — HEMOGLOBIN A1C
Est. average glucose Bld gHb Est-mCnc: 131 mg/dL
Hgb A1c MFr Bld: 6.2 % — ABNORMAL HIGH (ref 4.8–5.6)

## 2022-08-21 MED ORDER — METOPROLOL SUCCINATE ER 25 MG PO TB24: 12.5000 mg | ORAL_TABLET | Freq: Every evening | ORAL | 3 refills | Status: DC

## 2022-08-21 NOTE — Patient Instructions (Addendum)
Thank you for coming in today  Labs were done today, if any labs are abnormal the clinic will call you No news is good news  CHANGE Toprol XL to nightly    Your provider has recommended that  you wear a Zio Patch for 14 days.  This monitor will record your heart rhythm for our review.  IF you have any symptoms while wearing the monitor please press the button.  If you have any issues with the patch or you notice a red or orange light on it please call the company at [phone].  Once you remove the patch please mail it back to the company as soon as possible so we can get the results.  You have been referred to Palliative care their office will contact you for further details   Your physician recommends that you schedule a follow-up appointment in:  4 months with Dr. Kendall Flack will receive a reminder letter in the mail a few months in advance. If you don't receive a letter, please call our office to schedule the follow-up appointment.  Please wear your compression hose daily, place them on as soon as you get up in the morning and remove before you go to bed at night.      Do the following things EVERYDAY: Weigh yourself in the morning before breakfast. Write it down and keep it in a log. Take your medicines as prescribed Eat low salt foods--Limit salt (sodium) to 2000 mg per day.  Stay as active as you can everyday Limit all fluids for the day to less than 2 liters  At the [facility], you and your health needs are our priority. As part of our continuing mission to provide you with exceptional heart care, we have created designated Provider Care Teams. These Care Teams include your primary Cardiologist (physician) and Advanced Practice Providers (APPs- Physician Assistants and Nurse Practitioners) who all work together to provide you with the care you need, when you need it.   You may see any of the following providers on your designated Care Team at your next  follow up: Dr Glori Bickers Dr Loralie Champagne Dr. Roxana Hires, NP Lyda Jester, [place] [facility] Hilda, [place] Forestine Na, NP Audry Riles, PharmD   Please be sure to bring in all your medications bottles to every appointment.    Thank you for choosing [facility]   If you have any questions or concerns before your next appointment please send [place] a message through [place] or call our office at [phone].    TO LEAVE A MESSAGE FOR THE NURSE SELECT OPTION 2, PLEASE LEAVE A MESSAGE INCLUDING: YOUR NAME DATE OF BIRTH CALL BACK NUMBER REASON FOR CALL**this is important as we prioritize the call backs  YOU WILL RECEIVE A CALL BACK THE SAME DAY AS LONG AS YOU CALL BEFORE 4:00 PM

## 2022-08-22 DIAGNOSIS — R29898 Other symptoms and signs involving the musculoskeletal system: Secondary | ICD-10-CM | POA: Insufficient documentation

## 2022-08-22 DIAGNOSIS — R296 Repeated falls: Secondary | ICD-10-CM | POA: Insufficient documentation

## 2022-08-22 DIAGNOSIS — R42 Dizziness and giddiness: Secondary | ICD-10-CM | POA: Diagnosis not present

## 2022-08-22 NOTE — Assessment & Plan Note (Signed)
Refer to home health care.

## 2022-08-22 NOTE — Assessment & Plan Note (Addendum)
Continue toprol xl 25 mg 1/2 daily, plavix 75 mg qd, zetia 10 mg daily.  Management per specialist.

## 2022-08-22 NOTE — Assessment & Plan Note (Signed)
Platelets stable.

## 2022-08-22 NOTE — Assessment & Plan Note (Signed)
Intolerant to statins. 

## 2022-08-24 ENCOUNTER — Telehealth (HOSPITAL_COMMUNITY): Payer: Self-pay | Admitting: *Deleted

## 2022-08-24 DIAGNOSIS — I5022 Chronic systolic (congestive) heart failure: Secondary | ICD-10-CM

## 2022-08-24 MED ORDER — FUROSEMIDE 20 MG PO TABS: 20.0000 mg | ORAL_TABLET | ORAL | 3 refills | Status: DC

## 2022-08-24 MED ORDER — POTASSIUM CHLORIDE CRYS ER 20 MEQ PO TBCR: 20.0000 meq | EXTENDED_RELEASE_TABLET | ORAL | 3 refills | Status: DC

## 2022-08-24 NOTE — Telephone Encounter (Signed)
Called patient who asked that I call his daughter per Allena Katz, NP with following lab results and intructions:  "BNP elevated. Please start Lasix 20 mg on MWF, take 20 KCL on MWF.   Repeat BMET and he needs a Dig trough in 10 days   Daughter verbalized understanding of same and says he has Lasix and potassium at home, but will plan on sending new Rx to local pharmacy as well.   Lab appointment scheduled, orders placed, and instructed daughter to hold Digoxin morning of lab draw. Daughter verbalized understanding of above.

## 2022-08-25 DIAGNOSIS — I25118 Atherosclerotic heart disease of native coronary artery with other forms of angina pectoris: Secondary | ICD-10-CM | POA: Diagnosis not present

## 2022-08-25 DIAGNOSIS — I110 Hypertensive heart disease with heart failure: Secondary | ICD-10-CM | POA: Diagnosis not present

## 2022-08-25 DIAGNOSIS — R296 Repeated falls: Secondary | ICD-10-CM | POA: Diagnosis not present

## 2022-08-25 DIAGNOSIS — I350 Nonrheumatic aortic (valve) stenosis: Secondary | ICD-10-CM | POA: Diagnosis not present

## 2022-08-25 DIAGNOSIS — E1140 Type 2 diabetes mellitus with diabetic neuropathy, unspecified: Secondary | ICD-10-CM | POA: Diagnosis not present

## 2022-08-25 DIAGNOSIS — I252 Old myocardial infarction: Secondary | ICD-10-CM | POA: Diagnosis not present

## 2022-08-25 DIAGNOSIS — E1151 Type 2 diabetes mellitus with diabetic peripheral angiopathy without gangrene: Secondary | ICD-10-CM | POA: Diagnosis not present

## 2022-08-25 DIAGNOSIS — I509 Heart failure, unspecified: Secondary | ICD-10-CM | POA: Diagnosis not present

## 2022-08-25 DIAGNOSIS — J410 Simple chronic bronchitis: Secondary | ICD-10-CM | POA: Diagnosis not present

## 2022-08-25 DIAGNOSIS — J449 Chronic obstructive pulmonary disease, unspecified: Secondary | ICD-10-CM | POA: Diagnosis not present

## 2022-08-28 ENCOUNTER — Other Ambulatory Visit: Payer: Medicare HMO

## 2022-08-28 DIAGNOSIS — Z515 Encounter for palliative care: Secondary | ICD-10-CM

## 2022-08-28 NOTE — Progress Notes (Signed)
COMMUNITY PALLIATIVE CARE SW NOTE  PATIENT NAME: [name] DOB: [birth year] MRN: [id number]  PRIMARY CARE PROVIDER: Rochel Brome, MD  RESPONSIBLE PARTY:  Acct ID - Guarantor Home Phone Work Phone Relationship Acct Type  [id number] REICE, [phone] Self P/F     [address], [place], [place] [address]   Initial Social Work Visit  PC SW completed a telephonic encounter with patient's daughter. SW provided education regarding palliative care services, role in patient's care and visit frequency. She is open these services for her dad as he would need the additional support. She provided a status update on patient. She reports that patient lives alone with daily contact. He ambulates independently, but his gait is unsteady. He he has a history of falls. He is independent for ADL's, but struggles with it. He has no arm strength and has difficulty raising his arms. She report that patient was driving up until January, but he is no longer driving. Patient is able to verbalize his needs and has a desire to remain independent.      SOCIAL HX: Patient has been widowed since January. He has three children. He is a retired Hotel manager. Patient served in the [employer] briefly. He is Tourist information centre manager by faith.  Social History   Tobacco Use   Smoking status: Never   Smokeless tobacco: Never  Substance Use Topics   Alcohol use: No    CODE STATUS: Full Code  ADVANCED DIRECTIVES: No MOST FORM COMPLETE:  No HOSPICE EDUCATION PROVIDED: No  Duration of encounter and documentation: 30 minutes  Follow-up visit scheduled for 09/07/22 @ 11:30 am.   Katheren Puller, LCSW

## 2022-09-07 ENCOUNTER — Other Ambulatory Visit: Payer: Medicare HMO

## 2022-09-07 ENCOUNTER — Telehealth: Payer: Self-pay

## 2022-09-07 VITALS — BP 122/60 | HR 84 | Resp 16

## 2022-09-07 DIAGNOSIS — Z515 Encounter for palliative care: Secondary | ICD-10-CM

## 2022-09-07 NOTE — Telephone Encounter (Signed)
[place] to schedule their annual wellness visit. Appointment made for 09/10/22.  Norton Blizzard, [place] ([facility])  [facility] [phone]

## 2022-09-08 NOTE — Progress Notes (Signed)
1132- Palliative Care Initial  Encounter Note   PATIENT NAME: [name] DOB: [birth year] MRN: [id number]  PRIMARY CARE PROVIDER: Rochel Brome, MD  RESPONSIBLE PARTY:  Acct ID - Guarantor Home Phone Work Phone Relationship Acct Type  [id number] [name] [phone] Self P/F     [address], [place], [place] [address]    RN/SW completed home visit. Son also present   HISTORY OF PRESENT ILLNESS:    82 y.o. male with a past medical history significant for hypertension, diabetes, CAD, CHF, and COPD    Socially: Lives in single story home he built in 47s. Has two children that live locally. Pt planning to move in next couple of months to a tiny home in daughters yard.    Cognitive: Alert and oriented x 3. Answers questions appropriately   Appetite: Reports that he eats good. Has gained weight in the past couple of years.  Weighs himself every day. Weight today is 136. Sits in lift chair and will elevate feet when just sitting.    Mobility: Walks with walker in house. Walks with cane when out of the house. Pt reports having multiple falls in last several months. Last fall was Valentines Day. No injuries at this point. Reports that he gets dizzy at times. Always falls backwards.  Has PT thru [facility]. Also wears medic alert necklace.    Sleeping Pattern: Reports sleeping well. Used to getting up early. Son reports that he naps a lot.  Pain: "No problems with pain"  Palliative Care/ Hospice: RN explained role and purpose of palliative care including visit frequency. Also discussed benefits of hospice care as well as the differences between the two with patient.   Goals of Care: prevent falls.    CODE STATUS: Full Code ADVANCED DIRECTIVES: N MOST FORM: No PPS:   Next appt scheduled: Friday March 22nd at 10 am.     PHYSICAL EXAM:   VITALS: Today's Vitals   09/07/22 2340  BP: 122/60  Pulse: 84  Resp: 16  SpO2: 98%  PainSc: 0-No pain    LUNGS:  lungs clear bilat. CARDIAC: rate irregular. No JVD  EXTREMITIES: MAE x 4, no edema    Jacqulyn Cane, RN

## 2022-09-08 NOTE — Progress Notes (Signed)
COMMUNITY PALLIATIVE CARE SW NOTE  PATIENT NAME: [name] DOB: 1941/02/01 MRN: [id number]  PRIMARY CARE PROVIDER: Rochel Brome, MD  RESPONSIBLE PARTY:  Acct ID - Guarantor Home Phone Work Phone Relationship Acct Type  [id number] [name], [name] Self P/F     [address], [place], [place] [address]   Initial Palliative Care Visit/Clinical Social Work  SW completed a joint visit with RN-J. Livengood with patient at his home. The team provided education to him regarding palliative care services, role in his care and visit frequency. Patient signed the consent form, consenting to ongoing services.   Patient is alert and oriented x3. Patient was sitting in his recliner.Patient report that he has had several falls since the beginning of the year. His last fall was on 08/26/22. He uses a walker in his home and a cane when he goes out. He has stopped driving due to increased fall. He reports falling backwards. He has physical therapy through [facility]. Patient report that he sleeps well and naps during the day. He is eating well. He weighs himself daily, averaging 136 lbs. Patient denies pain. He states that he has an occasional sharp pain that go up his left side. Patient also suffers from heart failure.   Patient served in the army 6 years. He is not connected to [place].  Patient wears a life alert button. He carries his cell phone in his pocket for additional safety.  Patient has advance directives: Living Will, Full Code, and POA/HCPOA-Caleb Jaimar, Klein.   Patient Goals: Patient does not want to fall anymore; Patient will be moving into a tiny home in [place].  Patient would like mobile meals.   SOCIAL HX: Patient is widowed and have three children.  Social History   Tobacco Use   Smoking status: Never   Smokeless tobacco: Never  Substance Use Topics   Alcohol use: No    CODE STATUS: Full Code ADVANCED DIRECTIVES: Yes MOST FORM COMPLETE:  No HOSPICE  EDUCATION PROVIDED: No  Duration of visit and documentation: 60 minutes  Caleb M[place], LCSW

## 2022-09-09 DIAGNOSIS — R296 Repeated falls: Secondary | ICD-10-CM

## 2022-09-09 DIAGNOSIS — D693 Immune thrombocytopenic purpura: Secondary | ICD-10-CM

## 2022-09-09 DIAGNOSIS — E1151 Type 2 diabetes mellitus with diabetic peripheral angiopathy without gangrene: Secondary | ICD-10-CM

## 2022-09-09 DIAGNOSIS — G720 Drug-induced myopathy: Secondary | ICD-10-CM

## 2022-09-09 DIAGNOSIS — Z7984 Long term (current) use of oral hypoglycemic drugs: Secondary | ICD-10-CM

## 2022-09-09 DIAGNOSIS — I25118 Atherosclerotic heart disease of native coronary artery with other forms of angina pectoris: Secondary | ICD-10-CM

## 2022-09-09 DIAGNOSIS — E1140 Type 2 diabetes mellitus with diabetic neuropathy, unspecified: Secondary | ICD-10-CM

## 2022-09-09 DIAGNOSIS — I509 Heart failure, unspecified: Secondary | ICD-10-CM

## 2022-09-09 DIAGNOSIS — I252 Old myocardial infarction: Secondary | ICD-10-CM

## 2022-09-09 DIAGNOSIS — I110 Hypertensive heart disease with heart failure: Secondary | ICD-10-CM | POA: Diagnosis not present

## 2022-09-09 DIAGNOSIS — J449 Chronic obstructive pulmonary disease, unspecified: Secondary | ICD-10-CM

## 2022-09-09 DIAGNOSIS — I350 Nonrheumatic aortic (valve) stenosis: Secondary | ICD-10-CM | POA: Diagnosis not present

## 2022-09-09 DIAGNOSIS — J410 Simple chronic bronchitis: Secondary | ICD-10-CM

## 2022-09-10 ENCOUNTER — Ambulatory Visit (INDEPENDENT_AMBULATORY_CARE_PROVIDER_SITE_OTHER): Payer: Medicare HMO

## 2022-09-10 ENCOUNTER — Ambulatory Visit (HOSPITAL_COMMUNITY)
Admission: RE | Admit: 2022-09-10 | Discharge: 2022-09-10 | Disposition: A | Discharge status: Home / Self Care | Payer: Medicare HMO | Source: Ambulatory Visit | Attending: Cardiology | Admitting: Cardiology

## 2022-09-10 DIAGNOSIS — Z0000 Encounter for general adult medical examination without abnormal findings: Secondary | ICD-10-CM | POA: Diagnosis not present

## 2022-09-10 DIAGNOSIS — I5022 Chronic systolic (congestive) heart failure: Secondary | ICD-10-CM | POA: Diagnosis not present

## 2022-09-10 LAB — BASIC METABOLIC PANEL
Anion gap: 8 (ref 5–15)
BUN: 14 mg/dL (ref 8–23)
CO2: 24 mmol/L (ref 22–32)
Calcium: 9.4 mg/dL (ref 8.9–10.3)
Chloride: 102 mmol/L (ref 98–111)
Creatinine, Ser: 0.87 mg/dL (ref 0.61–1.24)
GFR, Estimated: >60 mL/min (ref >60)
Glucose, Bld: 127 mg/dL — ABNORMAL HIGH (ref 70–99)
Potassium: 4 mmol/L (ref 3.5–5.1)
Sodium: 134 mmol/L — ABNORMAL LOW (ref 135–145)

## 2022-09-10 LAB — DIGOXIN LEVEL: Digoxin Level: 0.6 ng/mL — ABNORMAL LOW (ref 0.8–2.0)

## 2022-09-11 NOTE — Progress Notes (Signed)
Subjective:   [name] is a 82 y.o. male who presents for Medicare Annual/Subsequent preventive examination.  I connected with  [name] on 09/11/22 by a audio enabled telemedicine application and verified that I am speaking with the correct person using two identifiers.  Patient Location: Home  Provider Location: Office/Clinic  I discussed the limitations of evaluation and management by telemedicine. The patient expressed understanding and agreed to proceed.  Cardiac Risk Factors include: advanced age (>24mn, >>34women);diabetes mellitus;male gender     Objective:    Today's Vitals   09/11/22 1110  PainSc: 0-No pain   There is no height or weight on file to calculate BMI.     01/28/2022   12:48 PM 01/25/2022    2:00 AM 07/20/2019   10:45 AM 07/20/2017   10:20 AM 10/04/2016    7:43 PM 07/16/2015    9:46 AM 07/17/2014    9:37 AM  Advanced Directives  Does Patient Have a Medical Advance Directive? No No Yes No No No No  Type of AComptrollerLiving will      Does patient want to make changes to medical advance directive?   No - Patient declined      Copy of [place] Chart?   No - copy requested      Would patient like information on creating a medical advance directive?  No - Guardian declined    No - patient declined information No - patient declined information    Current Medications (verified) Outpatient Encounter Medications as of 09/10/2022  Medication Sig   cholecalciferol (VITAMIN D3) 25 MCG (1000 UT) tablet Take 5,000 Units by mouth daily.   clopidogrel (PLAVIX) 75 MG tablet Take 1 tablet (75 mg total) by mouth daily.   digoxin (LANOXIN) 0.125 MG tablet Take 1 tablet (0.125 mg total) by mouth daily.   ezetimibe (ZETIA) 10 MG tablet TAKE 1 TABLET BY MOUTH DAILY   furosemide (LASIX) 20 MG tablet Take 1 tablet (20 mg total) by mouth every Monday, Wednesday, and Friday.   Insulin Pen Needle (BD PEN  NEEDLE NANO U/F) 32G X 4 MM MISC 1 each by Does not apply route daily.   Lancets MISC 1 each by Does not apply route daily as needed. Please provide lancets that fit patients lancet device   megestrol (MEGACE) 40 MG tablet Take 1 tablet (40 mg total) by mouth daily.   metFORMIN (GLUCOPHAGE) 500 MG tablet Take 1 tablet (500 mg total) by mouth 4 (four) times daily.   metoprolol succinate (TOPROL-XL) 25 MG 24 hr tablet Take 0.5 tablets (12.5 mg total) by mouth at bedtime.   Multiple Vitamin (MULTIVITAMIN) tablet Take 1 tablet by mouth daily.   nitroGLYCERIN (NITROSTAT) 0.4 MG SL tablet Place 1 tablet (0.4 mg total) under the tongue every 5 (five) minutes as needed for chest pain.   ONETOUCH ULTRA test strip AS DIRECTED   potassium chloride SA (KLOR-CON M) 20 MEQ tablet Take 1 tablet (20 mEq total) by mouth every Monday, Wednesday, and Friday.   Vericiguat (VERQUVO) 2.5 MG TABS Take 2.5 mg by mouth daily.   No facility-administered encounter medications on file as of 09/10/2022.    Allergies (verified) Lipitor [atorvastatin calcium], Crestor [rosuvastatin calcium], and Zocor [simvastatin]   History: Past Medical History:  Diagnosis Date   Acute exacerbation of CHF (congestive heart failure) [place] 09/29/[place]   Atherosclerotic heart disease of native coronary artery without angina pectoris  02/12/2010   Qualifier: Diagnosis of  By: Marland Mcalpine     Benign prostatic hyperplasia with lower urinary tract symptoms 12/04/2019   CAD (coronary artery disease)    Hx of    Cellulitis 03/19/2012   Chronic obstructive pulmonary disease ([place]) 12/04/2019   Diverticulosis of colon (without mention of hemorrhage) 2011   Colonoscopy    Hiatal hernia 2011   EGD   Hypertension    Hypertensive heart disease without heart failure 12/04/2019   ITP (idiopathic thrombocytopenic purpura)    He has been diagnosed with   Pancytopenia    Thrombocytopenia, unspecified ([place]) 01/24/2013   TRANSAMINASES, SERUM,  ELEVATED 02/12/2010   Qualifier: Diagnosis of  By: Marland Mcalpine     Type 2 diabetes mellitus with diabetic peripheral angiopathy without gangrene ([place]) 12/04/2019   Past Surgical History:  Procedure Laterality Date   CARDIAC CATHETERIZATION     His last heart catheterization in May of 2009 reveals a patent LIMA   CORONARY ANGIOPLASTY     Successful percutaneous transluminal coronary angioplasty of the left circumflex obuse marginal vessel   CORONARY ANGIOPLASTY WITH STENT PLACEMENT     CORONARY ARTERY BYPASS GRAFT     x4 with a left internal mammary artery anastomosis to the left anterior descending coronary artery    CORONARY STENT PLACEMENT     successful percutaneous transluminal coronary angioplasty and stenting of the left main coronary artery   HERNIA REPAIR     RIGHT/LEFT HEART CATH AND CORONARY/GRAFT ANGIOGRAPHY N/A 01/26/2022   Procedure: RIGHT/LEFT HEART CATH AND CORONARY/GRAFT ANGIOGRAPHY;  Surgeon: Troy Sine, MD;  Location: [facility];  Service: Cardiovascular;  Laterality: N/A;   SAPHENOUS VEIN GRAFT RESECTION     graft to the first obtuse marginal, a saphenous vein graft to the first diagonal coronary artery  and a saphenous vein graft to the distal right coronary artery   TEE WITHOUT CARDIOVERSION N/A 01/28/2022   Procedure: TRANSESOPHAGEAL ECHOCARDIOGRAM (TEE);  Surgeon: Berniece Salines, DO;  Location: [facility];  Service: Cardiovascular;  Laterality: N/A;   US ECHOCARDIOGRAPHY  02-08-2007   Est. EF 40-45%   Family History  Problem Relation Age of Onset   Cerebrovascular Accident Mother    Colon cancer Neg Hx    Social History   Socioeconomic History   Marital status: Married    Spouse name: Doris   Number of children: 3   Years of education: Not on file   Highest education level: High school graduate  Occupational History   Occupation: Retired   Tobacco Use   Smoking status: Never   Smokeless tobacco: Never  Vaping Use   Vaping Use: Never  used  Substance and Sexual Activity   Alcohol use: No   Drug use: No   Sexual activity: Not Currently  Other Topics Concern   Not on file  Social History Narrative   Daily caffeine use    Social Determinants of Health   Financial Resource Strain: Low Risk  (01/26/2022)   Overall Financial Resource Strain (CARDIA)    Difficulty of Paying Living Expenses: Not hard at all  Food Insecurity: No Food Insecurity (06/15/2022)   Hunger Vital Sign    Worried About Running Out of Food in the Last Year: Never true    [place] in the Last Year: Never true  Transportation Needs: No Transportation Needs (06/15/2022)   PRAPARE - Transportation    Lack of Transportation (Medical): No    Lack of  Transportation (Non-Medical): No  Physical Activity: Sufficiently Active (08/19/2022)   Exercise Vital Sign    Days of Exercise per Week: 7 days    Minutes of Exercise per Session: 30 min  Stress: No Stress Concern Present (08/19/2022)   [place]    Feeling of Stress : Not at all  Social Connections: Moderately Isolated (08/19/2022)   Social Connection and Isolation Panel [NHANES]    Frequency of Communication with Friends and Family: Three times a week    Frequency of Social Gatherings with Friends and Family: Three times a week    Attends Religious Services: More than 4 times per year    Active Member of Clubs or Organizations: No    Attends Archivist Meetings: Never    Marital Status: Widowed    Tobacco Counseling Counseling given: Not Answered   Clinical Intake:  Pre-visit preparation completed: Yes Pain : No/denies pain Pain Score: 0-No pain   BMI - recorded: 20.81 Nutritional Status: BMI of 19-24  Normal Nutritional Risks: None Diabetes: Yes (recent A1C 6.2) How often do you need to have someone help you when you read instructions, pamphlets, or other written materials from your doctor or pharmacy?: 3 -  Sometimes Interpreter Needed?: No    Activities of Daily Living    09/11/2022   11:13 AM 01/25/2022    2:00 AM  In your present state of health, do you have any difficulty performing the following activities:  Hearing? 1 1  Vision? 0 0  Difficulty concentrating or making decisions? 1 0  Walking or climbing stairs? 1 0  Dressing or bathing? 0 0  Doing errands, shopping? 1 0  Preparing Food and eating ? N   Using the Toilet? N   In the past six months, have you accidently leaked urine? N   Do you have problems with loss of bowel control? N   Managing your Medications? N   Managing your Finances? N   Housekeeping or managing your Housekeeping? N     Patient Care Team: Rochel Brome, MD as PCP - General (Family Medicine) Vickie Epley, MD as PCP - Electrophysiology (Cardiology)     Assessment:   This is a routine wellness examination for [facility].  Hearing/Vision screen No results found.  Dietary issues and exercise activities discussed: Current Exercise Habits: Structured exercise class (Physical Therapy), Type of exercise: walking;strength training/weights;stretching, Time (Minutes): 60, Frequency (Times/Week): 3, Weekly Exercise (Minutes/Week): 180, Intensity: Mild   Goals Addressed   None    Depression Screen    09/11/2022   11:12 AM 08/19/2022    8:25 AM 05/13/2022    1:02 PM 05/13/2021    8:13 AM 04/15/2020    8:14 AM  PHQ 2/9 Scores  PHQ - 2 Score 0 0 0 0 0    Fall Risk    09/11/2022   11:12 AM 08/19/2022    8:25 AM 06/15/2022   12:48 PM 09/17/2021    7:48 AM 05/13/2021    8:07 AM  [place] in the past year? 1 0 '1 1 1  '$ Number falls in past yr: 1 0 '1 1 1  '$ Injury with Fall? 1 0 1 0 0  Risk for fall due to : History of fall(s);Impaired balance/gait No Fall Risks History of fall(s);Impaired mobility;Medication side effect Other (Comment) History of fall(s);Impaired balance/gait  Risk for fall due to: Comment    Right knee issues at times  Follow up Falls  evaluation completed;Education provided;Falls prevention discussed Falls evaluation completed Falls prevention discussed;Education provided Falls evaluation completed Education provided    FALL RISK PREVENTION PERTAINING TO THE HOME:  Any stairs in or around the home? Yes  (patient does not use) If so, are there any without handrails? No  Home free of loose throw rugs in walkways, pet beds, electrical cords, etc? Yes  Adequate lighting in your home to reduce risk of falls? Yes   ASSISTIVE DEVICES UTILIZED TO PREVENT FALLS:  Life alert? Yes  Use of a cane, walker or w/c? Yes  Grab bars in the bathroom? Yes  Shower chair or bench in shower? Yes         Immunizations Immunization History  Administered Date(s) Administered   COVID-19, mRNA, vaccine(Comirnaty)12 years and older 05/13/2022   Fluad Quad(high Dose 65+) 04/15/2020, 05/13/2021   Influenza, High Dose Seasonal PF 03/11/2019   Influenza-Unspecified 03/21/2015, 03/09/2022   PFIZER(Purple Top)SARS-COV-2 Vaccination 09/08/2019, 10/04/2019, 05/30/2020   PNEUMOCOCCAL CONJUGATE-20 05/27/2022   Pneumococcal Conjugate-13 06/15/2014   Pneumococcal-Unspecified 03/16/2015   Tdap 10/04/2016, 06/12/2022    TDAP status: Up to date  Flu Vaccine status: Up to date  Pneumococcal vaccine status: Up to date  Covid-19 vaccine status: Completed vaccines  Qualifies for Shingles Vaccine? Yes   Zostavax completed No   Shingrix Completed?: No.    Education has been provided regarding the importance of this vaccine. Patient has been advised to call insurance company to determine out of pocket expense if they have not yet received this vaccine. Advised may also receive vaccine at local pharmacy or Health Dept. Verbalized acceptance and understanding.  Screening Tests Health Maintenance  Topic Date Due   Zoster Vaccines- Shingrix (1 of 2) Never done   OPHTHALMOLOGY EXAM  07/03/2021   FOOT EXAM  02/04/2023   HEMOGLOBIN A1C  02/17/2023    Diabetic kidney evaluation - Urine ACR  08/20/2023   Diabetic kidney evaluation - eGFR measurement  09/10/2023   DTaP/Tdap/Td (3 - Td or Tdap) 06/12/2032   Pneumonia Vaccine 56+ Years old  Completed   INFLUENZA VACCINE  Completed   COVID-19 Vaccine  Completed   HPV VACCINES  Aged Out    Health Maintenance  Health Maintenance Due  Topic Date Due   Zoster Vaccines- Shingrix (1 of 2) Never done   OPHTHALMOLOGY EXAM  07/03/2021    Colorectal cancer screening: No longer required.   Lung Cancer Screening: (Low Dose CT Chest recommended if Age 33-80 years, 30 pack-year currently smoking OR have quit w/in 15years.) does not qualify.   Lung Cancer Screening Referral: N/A  Additional Screening:  Vision Screening: Recommended annual ophthalmology exams for early detection of glaucoma and other disorders of the eye. Is the patient up to date with their annual eye exam?  Yes  Who is the provider or what is the name of the office in which the patient attends annual eye exams? [place] Screening: Recommended annual dental exams for proper oral hygiene  Community Resource Referral / Chronic Care Management: CRR required this visit?  No   CCM required this visit?  No      Plan:     I have personally reviewed and noted the following in the patient's chart:   Medical and social history Use of alcohol, tobacco or illicit drugs  Current medications and supplements including opioid prescriptions. Patient is not currently taking opioid prescriptions. Functional ability and status Nutritional status Physical activity Advanced directives List  of other physicians Hospitalizations, surgeries, and ER visits in previous 12 months Vitals Screenings to include cognitive, depression, and falls Referrals and appointments  In addition, I have reviewed and discussed with patient certain preventive protocols, quality metrics, and best practice recommendations. A written personalized  care plan for preventive services as well as general preventive health recommendations were provided to patient.     Erie Noe, LPN   [id number]

## 2022-09-11 NOTE — Patient Instructions (Signed)
Fall Prevention in the Home, Adult Falls can cause injuries and can happen to people of all ages. There are many things you can do to make your home safer and to help prevent falls. What actions can I take to prevent falls? General information Use good lighting in all rooms. Make sure to: Replace any light bulbs that burn out. Turn on the lights in dark areas and use night-lights. Keep items that you use often in easy-to-reach places. Lower the shelves around your home if needed. Move furniture so that there are clear paths around it. Do not use throw rugs or other things on the floor that can make you trip. If any of your floors are uneven, fix them. Add color or contrast paint or tape to clearly mark and help you see: Grab bars or handrails. First and last steps of staircases. Where the edge of each step is. If you use a ladder or stepladder: Make sure that it is fully opened. Do not climb a closed ladder. Make sure the sides of the ladder are locked in place. Have someone hold the ladder while you use it. Know where your pets are as you move through your home. What can I do in the bathroom?     Keep the floor dry. Clean up any water on the floor right away. Remove soap buildup in the bathtub or shower. Buildup makes bathtubs and showers slippery. Use non-skid mats or decals on the floor of the bathtub or shower. Attach bath mats securely with double-sided, non-slip rug tape. If you need to sit down in the shower, use a non-slip stool. Install grab bars by the toilet and in the bathtub and shower. Do not use towel bars as grab bars. What can I do in the bedroom? Make sure that you have a light by your bed that is easy to reach. Do not use any sheets or blankets on your bed that hang to the floor. Have a firm chair or bench with side arms that you can use for support when you get dressed. What can I do in the kitchen? Clean up any spills right away. If you need to reach something  above you, use a step stool with a grab bar. Keep electrical cords out of the way. Do not use floor polish or wax that makes floors slippery. What can I do with my stairs? Do not leave anything on the stairs. Make sure that you have a light switch at the top and the bottom of the stairs. Make sure that there are handrails on both sides of the stairs. Fix handrails that are broken or loose. Install non-slip stair treads on all your stairs if they do not have carpet. Avoid having throw rugs at the top or bottom of the stairs. Choose a carpet that does not hide the edge of the steps on the stairs. Make sure that the carpet is firmly attached to the stairs. Fix carpet that is loose or worn. What can I do on the outside of my home? Use bright outdoor lighting. Fix the edges of walkways and driveways and fix any cracks. Clear paths of anything that can make you trip, such as tools or rocks. Add color or contrast paint or tape to clearly mark and help you see anything that might make you trip as you walk through a door, such as a raised step or threshold. Trim any bushes or trees on paths to your home. Check to see if handrails are loose  or broken and that both sides of all steps have handrails. Install guardrails along the edges of any raised decks and porches. Have leaves, snow, or ice cleared regularly. Use sand, salt, or ice melter on paths if you live where there is ice and snow during the winter. Clean up any spills in your garage right away. This includes grease or oil spills. What other actions can I take? Review your medicines with your doctor. Some medicines can cause dizziness or changes in blood pressure, which increase your risk of falling. Wear shoes that: Have a low heel. Do not wear high heels. Have rubber bottoms and are closed at the toe. Feel good on your feet and fit well. Use tools that help you move around if needed. These include: Canes. Walkers. Scooters. Crutches. Ask  your doctor what else you can do to help prevent falls. This may include seeing a physical therapist to learn to do exercises to move better and get stronger. Where to find more information Centers for Disease Control and Prevention, [employer]: StoreMirror.com.cy [employer] on Aging: AquariamTheater.co.nz National Institute on Aging: AquariamTheater.co.nz Contact a doctor if: You are afraid of falling at home. You feel weak, drowsy, or dizzy at home. You fall at home. Get help right away if you: Lose consciousness or have trouble moving after a fall. Have a fall that causes a head injury. These symptoms may be an emergency. Get help right away. Call 911. Do not wait to see if the symptoms will go away. Do not drive yourself to the hospital. This information is not intended to replace advice given to you by your health care provider. Make sure you discuss any questions you have with your health care provider. Document Revised: 03/02/2022 Document Reviewed: 03/02/2022 Elsevier Patient Education  [place] Maintenance, Male Adopting a healthy lifestyle and getting preventive care are important in promoting health and wellness. Ask your health care provider about: The right schedule for you to have regular tests and exams. Things you can do on your own to prevent diseases and keep yourself healthy. What should I know about diet, weight, and exercise? Eat a healthy diet  Eat a diet that includes plenty of vegetables, fruits, low-fat dairy products, and lean protein. Do not eat a lot of foods that are high in solid fats, added sugars, or sodium. Maintain a healthy weight Body mass index (BMI) is a measurement that can be used to identify possible weight problems. It estimates body fat based on height and weight. Your health care provider can help determine your BMI and help you achieve or maintain a healthy weight. Get regular exercise Get regular exercise. This is one of the most important things  you can do for your health. Most adults should: Exercise for at least 150 minutes each week. The exercise should increase your heart rate and make you sweat (moderate-intensity exercise). Do strengthening exercises at least twice a week. This is in addition to the moderate-intensity exercise. Spend less time sitting. Even light physical activity can be beneficial. Watch cholesterol and blood lipids Have your blood tested for lipids and cholesterol at 84 years of age, then have this test every 5 years. You may need to have your cholesterol levels checked more often if: Your lipid or cholesterol levels are high. You are older than 84 years of age. You are at high risk for heart disease. What should I know about cancer screening? Many types of cancers can be detected early and may  often be prevented. Depending on your health history and family history, you may need to have cancer screening at various ages. This may include screening for: Colorectal cancer. Prostate cancer. Skin cancer. Lung cancer. What should I know about heart disease, diabetes, and high blood pressure? Blood pressure and heart disease High blood pressure causes heart disease and increases the risk of stroke. This is more likely to develop in people who have high blood pressure readings or are overweight. Talk with your health care provider about your target blood pressure readings. Have your blood pressure checked: Every 3-5 years if you are 65-76 years of age. Every year if you are 15 years old or older. If you are between the ages of 52 and 58 and are a current or former smoker, ask your health care provider if you should have a one-time screening for abdominal aortic aneurysm (AAA). Diabetes Have regular diabetes screenings. This checks your fasting blood sugar level. Have the screening done: Once every three years after age 27 if you are at a normal weight and have a low risk for diabetes. More often and at a younger  age if you are overweight or have a high risk for diabetes. What should I know about preventing infection? Hepatitis B If you have a higher risk for hepatitis B, you should be screened for this virus. Talk with your health care provider to find out if you are at risk for hepatitis B infection. Hepatitis C Blood testing is recommended for: Everyone born from [birth year] through 1967. Anyone with known risk factors for hepatitis C. Sexually transmitted infections (STIs) You should be screened each year for STIs, including gonorrhea and chlamydia, if: You are sexually active and are younger than 84 years of age. You are older than 84 years of age and your health care provider tells you that you are at risk for this type of infection. Your sexual activity has changed since you were last screened, and you are at increased risk for chlamydia or gonorrhea. Ask your health care provider if you are at risk. Ask your health care provider about whether you are at high risk for HIV. Your health care provider may recommend a prescription medicine to help prevent HIV infection. If you choose to take medicine to prevent HIV, you should first get tested for HIV. You should then be tested every 3 months for as long as you are taking the medicine. Follow these instructions at home: Alcohol use Do not drink alcohol if your health care provider tells you not to drink. If you drink alcohol: Limit how much you have to 0-2 drinks a day. Know how much alcohol is in your drink. In the [place] one drink equals one 12 oz bottle of beer (355 mL), one 5 oz glass of wine (148 mL), or one 1 oz glass of hard liquor (44 mL). Lifestyle Do not use any products that contain nicotine or tobacco. These products include cigarettes, chewing tobacco, and vaping devices, such as e-cigarettes. If you need help quitting, ask your health care provider. Do not use street drugs. Do not share needles. Ask your health care provider for help if you  need support or information about quitting drugs. General instructions Schedule regular health, dental, and eye exams. Stay current with your vaccines. Tell your health care provider if: You often feel depressed. You have ever been abused or do not feel safe at home. Summary Adopting a healthy lifestyle and getting preventive care are important in promoting health  and wellness. Follow your health care provider's instructions about healthy diet, exercising, and getting tested or screened for diseases. Follow your health care provider's instructions on monitoring your cholesterol and blood pressure. This information is not intended to replace advice given to you by your health care provider. Make sure you discuss any questions you have with your health care provider. Document Revised: 11/18/2020 Document Reviewed: 11/18/2020 Elsevier Patient Education  [place].

## 2022-09-14 ENCOUNTER — Other Ambulatory Visit: Payer: Self-pay | Admitting: Cardiovascular Disease

## 2022-09-17 ENCOUNTER — Ambulatory Visit: Payer: Medicare HMO | Admitting: Podiatry

## 2022-09-18 NOTE — Addendum Note (Signed)
Encounter addended by: Micki Riley, RN on: 09/18/2022 11:30 AM  Actions taken: Imaging Exam ended

## 2022-09-21 NOTE — Addendum Note (Signed)
Encounter addended by: Rafael Bihari, FNP on: 09/21/2022 8:09 AM  Actions taken: Clinical Note Signed

## 2022-09-22 ENCOUNTER — Telehealth (HOSPITAL_COMMUNITY): Payer: Self-pay | Admitting: *Deleted

## 2022-09-22 NOTE — Telephone Encounter (Signed)
Called daughter per [facility] with Zio results as follows:  "Mostly normal sinus rhythm, no atrial fibrillation or high grade arrhythmias. Frequent short runs on SVT (fast heart beats), rare PACs/PVCs. Stable and no obvious arrhythmogenic reason for dizziness"  Daughter verbalized understanding of same. She says her father hasn't had any further dizzy spells since day of clinic visit.  Daughter asked if patient needs to stay on Lasix 3 days a week, he says he has to "void a lot". I informed her, based on repeat labs, Janett Billow made no changes in meds. Asked if she or patient feels they need to stop Lasix, please call [facility] to schedule clinic appointment with provider for re-assessment. Daughter verbalized understanding of same.

## 2022-09-24 ENCOUNTER — Ambulatory Visit: Payer: Medicare HMO | Admitting: Podiatry

## 2022-09-24 VITALS — BP 129/73 | HR 84

## 2022-09-24 DIAGNOSIS — M79609 Pain in unspecified limb: Secondary | ICD-10-CM

## 2022-09-24 DIAGNOSIS — Q828 Other specified congenital malformations of skin: Secondary | ICD-10-CM

## 2022-09-24 DIAGNOSIS — B351 Tinea unguium: Secondary | ICD-10-CM | POA: Diagnosis not present

## 2022-09-24 DIAGNOSIS — E1142 Type 2 diabetes mellitus with diabetic polyneuropathy: Secondary | ICD-10-CM | POA: Diagnosis not present

## 2022-09-24 NOTE — Progress Notes (Signed)
  Subjective:  Patient ID: [name], male    DOB: [birth year],  MRN: [id number]  [name] presents to clinic today for at risk foot care with history of diabetic neuropathy and painful porokeratotic lesion(s) both feet and painful mycotic toenails that limit ambulation. Painful toenails interfere with ambulation. Aggravating factors include wearing enclosed shoe gear. Pain is relieved with periodic professional debridement. Painful porokeratotic lesions are aggravated when weightbearing with and without shoegear. Pain is relieved with periodic professional debridement.  Chief Complaint  Patient presents with   Diabetes    Diabetic Foot care, A1c-6.4 BG-137, PCP last seen a week ago, nail and callus trim    New problem(s): None.   PCP is Cox, Kirsten, MD.  Allergies  Allergen Reactions   Lipitor [Atorvastatin Calcium] Other (See Comments)    Causes memory loss   Crestor [Rosuvastatin Calcium] Other (See Comments)    Causes Muscle Pain   Zocor [Simvastatin] Other (See Comments)    Causes muscle pain    Review of Systems: Negative except as noted in the HPI.  Objective: No changes noted in today's physical examination. There were no vitals filed for this visit. [name] is a pleasant 82 y.o. male thin build in NAD. AAO x 3.  Vascular Examination: CFT <3 seconds b/l. DP pulses faintly palpable b/l. PT pulses diminished b/l. Digital hair absent. Skin temperature gradient warm to warm b/l. No ischemia or gangrene. No cyanosis or clubbing noted b/l. Varicosities present b/l.   Neurological Examination: Sensation grossly intact b/l with 10 gram monofilament.Vibratory sensation diminished b/l.  Dermatological Examination: Pedal skin warm and supple b/l. Toenails 1-5 b/l thick, discolored, elongated with subungual debris and pain on dorsal palpation.    Porokeratotic lesion(s) submet head 1 left foot, submet head 2 right foot, submet head 3 b/l, and submet head 5  b/l. No erythema, no edema, no drainage, no fluctuance.  Musculoskeletal Examination: Muscle strength 5/5 to b/l LE. HAV with bunion bilaterally and hammertoes 2-5 b/l.  Radiographs: None  Assessment/Plan: 1. Pain due to onychomycosis of nail   2. Porokeratosis   3. Diabetic polyneuropathy associated with type 2 diabetes mellitus ([place])     -Patient was evaluated and treated. All patient's and/or POA's questions/concerns answered on today's visit. -Stressed the importance of good glycemic control and the detriment of not  controlling glucose levels in relation to the foot. -Continue foot and shoe inspections daily. Monitor blood glucose per PCP/Endocrinologist's recommendations. -Mycotic toenails 1-5 bilaterally were debrided in length and girth with sterile nail nippers and dremel without incident. -Porokeratotic lesion(s) submet head 1 left foot, submet head 2 right foot, submet head 3 b/l, and submet head 5 b/l pared and enucleated with sterile currette without incident. Total number of lesions debrided=6. -Patient/POA to call should there be question/concern in the interim.   Return in about 3 months (around 12/25/2022).  Marzetta Board, DPM

## 2022-09-27 ENCOUNTER — Encounter: Payer: Self-pay | Admitting: Podiatry

## 2022-10-02 ENCOUNTER — Other Ambulatory Visit: Payer: Medicare HMO

## 2022-10-02 DIAGNOSIS — Z515 Encounter for palliative care: Secondary | ICD-10-CM

## 2022-10-02 NOTE — Progress Notes (Signed)
PATIENT NAME: [name] DOB: Apr 15, 1941 MRN: [id number]  PRIMARY CARE PROVIDER: Rochel Brome, MD  RESPONSIBLE PARTY:  Acct ID - Guarantor Home Phone Work Phone Relationship Acct Type  [id number] MAISON, [phone] Self P/F     [address], [place], [place] [id number]    Palliative Care Encounter Note   Completed telephone visit.    I connected with  [name] on 10/02/22 by telephone and verified that I am speaking with the correct person using two identifiers.     Respiratory: no SOB; no issues  Cardiac: takes a "pee pill" 3 days per week due to fluid retention  Cognitive: alert and oriented; we talked about him going to the beach this Sunday for his birthday; pt reports his [place] is supposed to be delivered to his daughter's property on 3/23 "maybe"   Appetite: eats 3 meals per day and rare snacks; food brought in by his children   GI/GU: has a BM daily; no urinary issues; denies wearing adult briefs    Mobility: has a new walker with wheels on it; PT came last week but he's not sure if/when they're coming back; he was enjoying the exercises and feels it is helping him get stronger; he wishes he had better core and back strength   Sleeping Pattern: doesn't sleep well at night but takes naps throughout the day  Pain: denies pain  Wt: daily weights done at home; 140lbs today; weighed the same on 10/01/22    Goals of Care: To move into a tiny home and prevent falls   CODE STATUS: Full Code  ADVANCED DIRECTIVES: N MOST FORM: N   PHYSICAL EXAM:   VITALS:There were no vitals filed for this visit.    Patient remains stable with family support    Paola Aleshire Georgann Housekeeper, LPN

## 2022-11-18 ENCOUNTER — Ambulatory Visit: Payer: Medicare HMO | Attending: Cardiovascular Disease | Admitting: Cardiovascular Disease

## 2022-11-18 ENCOUNTER — Encounter: Payer: Self-pay | Admitting: Cardiovascular Disease

## 2022-11-18 VITALS — BP 110/60 | HR 72 | Ht 69.0 in | Wt 146.0 lb

## 2022-11-18 DIAGNOSIS — I5042 Chronic combined systolic (congestive) and diastolic (congestive) heart failure: Secondary | ICD-10-CM

## 2022-11-18 NOTE — Patient Instructions (Signed)
Medication Instructions:  Your physician recommends that you continue on your current medications as directed. Please refer to the Current Medication list given to you today.  *If you need a refill on your cardiac medications before your next appointment, please call your pharmacy*   Lab Work: NONE If you have labs (blood work) drawn today and your tests are completely normal, you will receive your results only by: MyChart Message (if you have MyChart) OR A paper copy in the mail If you have any lab test that is abnormal or we need to change your treatment, we will call you to review the results.   Testing/Procedures: NONE   Follow-Up: At [facility], you and your health needs are our priority.  As part of our continuing mission to provide you with exceptional heart care, we have created designated Provider Care Teams.  These Care Teams include your primary Cardiologist (physician) and Advanced Practice Providers (APPs -  Physician Assistants and Nurse Practitioners) who all work together to provide you with the care you need, when you need it.  We recommend signing up for the patient portal called "MyChart".  Sign up information is provided on this After Visit Summary.  MyChart is used to connect with patients for Virtual Visits (Telemedicine).  Patients are able to view lab/test results, encounter notes, upcoming appointments, etc.  Non-urgent messages can be sent to your provider as well.   To learn more about what you can do with MyChart, go to [email or web address].    Your next appointment:   6 month(s)  Provider:   Philip Nahser, MD      

## 2022-11-18 NOTE — Progress Notes (Signed)
Cardiology Office Note  Date:  11/18/2022   ID:  [name], DOB [birth year], MRN [id number]  PCP:  Blane Ohara, MD  Cardiologist:   Kristeen Miss, MD   Chief Complaint  Patient presents with   Congestive Heart Failure   1. Coronary artery disease-status post MI and eventually s/p  CABG  ( 2003) and also stenting ( 2007) 2. Hyperlipidemia-he has been generally intolerant of statins-Crestor causes muscle pain, Lipitor causes memory loss, Zocor causes muscle pain. 2. Diabetes mellitus 3.  Chronic diastolic CHF:   [name] is an 82 year old gentleman with a history of coronary artery disease. He status post coronary artery bypass grafting. He is also status post PTCA and stenting. He also has a  history of hyperlipidemia and diabetes mellitus.   He is exercising regularly .  He does lots of yard work.  He enjoys going to his beach house.  January 05, 2013:  [name] is doing well.  His garden is doing well.  Occasional CP and dyspnea.  Pains last only for a few seconds.   Does not have to take a NTG.  Mows the lawn and does yard work without problems.    Dec. 16, 2014:  [name] is doing well.  Doing yard work without problems.    Occasional episodes  December 12, 2013:  [name] is doing ok.    Rare CP .  Frequent head aches  Dec. 1 , 2015:  No cardiac complaints.   Aches all over.  "staggers "  when he walks or stands. No angina.    He notices  That he has occasional episodes of dyspnea.     December 13, 2014:  [name] is a 82 y.o. male who presents for his  CAD. Very rare CP.  Has some DOE climbing up his hill at his house.  Nov. 29, 2016:  [name] continues to have some chest pain .    Seems to start in his lower abdomen and moves up. Will last for several days.  NTG works at times and at other times, has no effect.  Exercising some.   Fatigues easily .  Has leg pain with walking .   Thinks the diabetes has a lot to do with his fatigue . Glucose levels  are ok  Go[place]   Nov 12, 2015:  Doing well from a cardiac standpoint. Got dizzy - does not know if he had a syncopal episode. Had not been out working much. He thinks he may have tripped on the step   Oct. 30 , 2017:  Doing well. No CP or dyspnea  Has had some sinus issues,  Went to [facility]. ,   Hx of CAD - intolerant to statins  - lots of muscle aches   January 28, 2017:  [name] is seen today . Seems to be stable.   Still has a little CP with walking up hills but this seems to be stable .  Does not tolerate statins Is on Niacin,   Last lipids level looks good.   Feb. 8, 2019  [name] is seen back today  No CP , Has some knots on his legs.     Oct. 22, 2019: Seen wife wife today --  No CP or dyspnea. Has episodes of hypoglycemia and has subsequent dizziness.  Still moderately active.    Has some DOE working outside.  Legs and feet hurt him  frequently  - has arthritis   May 10, 2019:  [name] is seen today for follow-up visit of his coronary artery disease and congestive heart failure.  I saw him via telemedicine visit in April.  No cp or dyspnea.  Able to do all of his normal activities.    Nov 14, 2020: [name] is seen for follow of up his CAD, COPD  Is falling some,  Encouraged him to use a cane or walker  Is able to get some exercise ,     Nov 11, 2021 [name] is seen for follow up of his CAD Has poor appetite, loss of energy  Echo from 2022 showed normal LV functon  Has slowed generally over the past several years  Has fallen several times over the past several years   February 02, 2022: [name] is seen today for follow-up visit.  He has a history of coronary artery disease, COPD and CHF. He was recently hospitalized with heart failure.  He was found to have an LVEF of less than 20%.  He had moderate to severe mitral regurgitation.  The mitral regurgitation appears to be secondary/functional regurgitation.  The plan was to  continue guided medical therapy including diuresis and other standard medications.  Hopefully his mitral regurgitation will improve as his LVEF improves.  His wt has steadily gone down during the DC From 133 - 130  Has been taking lasix 20 mg a day    Nov 18, 2022: Seen with daughter , [name]  He was seen today for follow-up visit.  He has a history of coronary artery disease, congestive heart failure, COPD.  Has been seen in CHF clinic. Has home palliative care  EF is < 20% Has severe MR   Now lives in a tiny home next to daughters house  Is self sufficient   Lasix and Kdur 3 days a week .   We discussed reducing his lasix and Kdur to twice a week .  I told him that he can reduce it further if needed.      Past Medical History:  Diagnosis Date   Acute exacerbation of CHF (congestive heart failure) (HCC) 09/29/2011   Atherosclerotic heart disease of native coronary artery without angina pectoris 02/12/2010   Qualifier: Diagnosis of  By: Alesia Richards     Benign prostatic hyperplasia with lower urinary tract symptoms 12/04/2019   CAD (coronary artery disease)    Hx of    Cellulitis 03/19/2012   Chronic obstructive pulmonary disease (HCC) 12/04/2019   Diverticulosis of colon (without mention of hemorrhage) 2011   Colonoscopy    Hiatal hernia 2011   EGD   Hypertension    Hypertensive heart disease without heart failure 12/04/2019   ITP (idiopathic thrombocytopenic purpura)    He has been diagnosed with   Pancytopenia    Thrombocytopenia, unspecified (HCC) 01/24/2013   TRANSAMINASES, SERUM, ELEVATED 02/12/2010   Qualifier: Diagnosis of  By: Alesia Richards     Type 2 diabetes mellitus with diabetic peripheral angiopathy without gangrene (HCC) 12/04/2019    Past Surgical History:  Procedure Laterality Date   CARDIAC CATHETERIZATION     His last heart catheterization in May of 2009 reveals a patent LIMA   CORONARY ANGIOPLASTY     Successful percutaneous  transluminal coronary angioplasty of the left circumflex obuse marginal vessel   CORONARY ANGIOPLASTY WITH STENT PLACEMENT     CORONARY ARTERY BYPASS GRAFT     x4 with a left internal mammary artery anastomosis  to the left anterior descending coronary artery    CORONARY STENT PLACEMENT     successful percutaneous transluminal coronary angioplasty and stenting of the left main coronary artery   HERNIA REPAIR     RIGHT/LEFT HEART CATH AND CORONARY/GRAFT ANGIOGRAPHY N/A 01/26/2022   Procedure: RIGHT/LEFT HEART CATH AND CORONARY/GRAFT ANGIOGRAPHY;  Surgeon: Lennette Bihari, MD;  Location: [facility] INVASIVE CV LAB;  Service: Cardiovascular;  Laterality: N/A;   SAPHENOUS VEIN GRAFT RESECTION     graft to the first obtuse marginal, a saphenous vein graft to the first diagonal coronary artery  and a saphenous vein graft to the distal right coronary artery   TEE WITHOUT CARDIOVERSION N/A 01/28/2022   Procedure: TRANSESOPHAGEAL ECHOCARDIOGRAM (TEE);  Surgeon: Thomasene Ripple, DO;  Location: [facility] ENDOSCOPY;  Service: Cardiovascular;  Laterality: N/A;   US ECHOCARDIOGRAPHY  02-08-2007   Est. EF 40-45%     Current Outpatient Medications  Medication Sig Dispense Refill   cholecalciferol (VITAMIN D3) 25 MCG (1000 UT) tablet Take 5,000 Units by mouth daily.     clopidogrel (PLAVIX) 75 MG tablet Take 1 tablet (75 mg total) by mouth daily. 90 tablet 3   digoxin (LANOXIN) 0.125 MG tablet Take 1 tablet (0.125 mg total) by mouth daily. 90 tablet 3   ezetimibe (ZETIA) 10 MG tablet Take 1 tablet by mouth once daily 90 tablet 0   furosemide (LASIX) 20 MG tablet Take 1 tablet (20 mg total) by mouth every Monday, Wednesday, and Friday. 45 tablet 3   Insulin Pen Needle (BD PEN NEEDLE NANO U/F) 32G X 4 MM MISC 1 each by Does not apply route daily. 100 each 3   Lancets MISC 1 each by Does not apply route daily as needed. Please provide lancets that fit patients lancet device 100 each 4   megestrol (MEGACE) 40 MG tablet Take 1  tablet (40 mg total) by mouth daily. 30 tablet 2   metFORMIN (GLUCOPHAGE) 500 MG tablet Take 1 tablet (500 mg total) by mouth 4 (four) times daily. (Patient taking differently: Take 500 mg by mouth 3 (three) times daily.) 360 tablet 1   metoprolol succinate (TOPROL-XL) 25 MG 24 hr tablet Take 0.5 tablets (12.5 mg total) by mouth at bedtime. 30 tablet 3   Multiple Vitamin (MULTIVITAMIN) tablet Take 1 tablet by mouth daily.     nitroGLYCERIN (NITROSTAT) 0.4 MG SL tablet Place 1 tablet (0.4 mg total) under the tongue every 5 (five) minutes as needed for chest pain. 90 tablet 3   ONETOUCH ULTRA test strip AS DIRECTED 100 each 4   potassium chloride SA (KLOR-CON M) 20 MEQ tablet Take 1 tablet (20 mEq total) by mouth every Monday, Wednesday, and Friday. 45 tablet 3   Vericiguat (VERQUVO) 2.5 MG TABS Take 2.5 mg by mouth daily. 30 tablet 11   No current facility-administered medications for this visit.    Allergies:   Lipitor [atorvastatin calcium], Crestor [rosuvastatin calcium], and Zocor [simvastatin]    Social History:  The patient  reports that he has never smoked. He has never used smokeless tobacco. He reports that he does not drink alcohol and does not use drugs.   Family History:  The patient's family history includes Cerebrovascular Accident in his mother.   ROS: Noted in current history, all other systems are negative.Marland Kitchen    Physical Exam: Blood pressure 110/60, pulse 72, height 5\' 9"  (1.753 m), weight 146 lb (66.2 kg), SpO2 98 %.       GEN:  chronically ill ,  elderly male ,  in no acute distress HEENT: Normal NECK: No JVD; No carotid bruits LYMPHATICS: No lymphadenopathy CARDIAC: RRR  soft systolic murmur  RESPIRATORY:  Clear to auscultation without rales, wheezing or rhonchi  ABDOMEN: Soft, non-tender, non-distended MUSCULOSKELETAL:  No edema; No deformity  SKIN: Warm and dry NEUROLOGIC:  Alert and oriented x 3     EKG:          Recent Labs: 01/24/2022: TSH  2.684 01/29/2022: Magnesium 2.0 08/19/2022: ALT 15 08/21/2022: B Natriuretic Peptide 1,224.1; Hemoglobin 11.6; Platelets 138 09/10/2022: BUN 14; Creatinine, Ser 0.87; Potassium 4.0; Sodium 134   Lipid Panel    Component Value Date/Time   CHOL 142 08/19/2022 0922   TRIG 61 08/19/2022 0922   HDL 55 08/19/2022 0922   CHOLHDL 2.6 08/19/2022 0922   CHOLHDL 2.7 05/11/2016 1002   VLDL 18 05/11/2016 1002   LDLCALC 74 08/19/2022 0922      Wt Readings from Last 3 Encounters:  11/18/22 146 lb (66.2 kg)  08/21/22 142 lb (64.4 kg)  08/19/22 141 lb (64 kg)      Other studies Reviewed: Additional studies/ records that were reviewed today include: . Review of the above records demonstrates:    ASSESSMENT AND PLAN:  1. Coronary artery disease-        2. Hyperlipidemia-        .  3. Diabetes mellitis -     4. Weakness:        5. CHF-EF is less than 20%.  He has severe mitral regurgitation. He takes Lasix and potassium 3 days a week but states that this is too much.  He was running to the bathroom all the time.  Today he is relatively euvolemic.  Lungs are clear.  His legs have no edema.  I gave him the okay to reduce the Lasix and potassium to twice a week.  He and his daughter know that he should probably increase his Lasix if he becomes volume overloaded.  I gave him permission to hold Lasix if he has a day that is busy with appointments.  At this point I think it is more important to make sure that he has a relatively good quality of life instead of sticking to tight medical therapy.  As discussed in congestive heart failure clinic.  I do not think that he would tolerate guideline directed medical therapy.  We have attempted to have him on these medications for years and he continues to have problems with hypotension.  Continue medications.   Will have him return to see the heart failure clinic in 3 months.  I will see him in 6 months.   The following changes have been made:  no  change  Labs/ tests ordered today include:   No orders of the defined types were placed in this encounter.    Disposition:      Kristeen Miss, MD  11/18/2022 10:03 AM    [facility] [address] [place], [place], [place]  [address] Phone: [phone]; Fax: [phone]

## 2022-11-24 ENCOUNTER — Other Ambulatory Visit: Payer: Self-pay

## 2022-11-24 MED ORDER — MEGESTROL ACETATE 40 MG PO TABS: 40.0000 mg | ORAL_TABLET | Freq: Every day | ORAL | 2 refills | Status: DC

## 2022-12-02 ENCOUNTER — Telehealth: Payer: Self-pay

## 2022-12-02 NOTE — Telephone Encounter (Signed)
(  5:12 pm) PC SW returned call to daughter-Kim, who requested a follow-up visit with patient as his falls have increased.  Patient will be seen by palliative care nurse on 12/04/22 @ 11am.

## 2022-12-04 ENCOUNTER — Other Ambulatory Visit: Payer: Medicare HMO

## 2022-12-08 ENCOUNTER — Other Ambulatory Visit: Payer: Self-pay | Admitting: Family Medicine

## 2022-12-08 ENCOUNTER — Telehealth: Payer: Self-pay | Admitting: Family Medicine

## 2022-12-08 DIAGNOSIS — R29898 Other symptoms and signs involving the musculoskeletal system: Secondary | ICD-10-CM

## 2022-12-08 DIAGNOSIS — W19XXXA Unspecified fall, initial encounter: Secondary | ICD-10-CM

## 2022-12-08 NOTE — Telephone Encounter (Signed)
-----   Message from Barbette Merino, RN sent at 12/04/2022 11:38 AM EDT ----- Regarding: Physical therapy request Hi Dr Sedalia Muta,  My name is PJ and I am a palliative care nurse with [facility]. I am here seeing Mr Zdanowicz and we are talking about some balance issues, weakness, and a fall that he had last Thursday night. He denied injury but is very hesitant when walking around since that fall. He told me that physical therapy, Earvin Hansen with [facility], had signed off a few weeks ago.     Mr Dylla has had a recent move to be closer to his daughter [address] [place]. I am requesting for you to send a new eval and treat referral to [facility] to come and assess his current situation and status, if you are in agreement.

## 2022-12-08 NOTE — Telephone Encounter (Signed)
Physical therapy ordred through [facility]. Dr. Sedalia Muta

## 2022-12-09 ENCOUNTER — Other Ambulatory Visit: Payer: Medicare HMO

## 2022-12-11 ENCOUNTER — Other Ambulatory Visit: Payer: Self-pay | Admitting: Cardiovascular Disease

## 2022-12-11 DIAGNOSIS — I110 Hypertensive heart disease with heart failure: Secondary | ICD-10-CM | POA: Diagnosis not present

## 2022-12-11 DIAGNOSIS — W19XXXA Unspecified fall, initial encounter: Secondary | ICD-10-CM | POA: Diagnosis not present

## 2022-12-11 DIAGNOSIS — I2510 Atherosclerotic heart disease of native coronary artery without angina pectoris: Secondary | ICD-10-CM | POA: Diagnosis not present

## 2022-12-11 DIAGNOSIS — Z951 Presence of aortocoronary bypass graft: Secondary | ICD-10-CM | POA: Diagnosis not present

## 2022-12-11 DIAGNOSIS — I5032 Chronic diastolic (congestive) heart failure: Secondary | ICD-10-CM | POA: Diagnosis not present

## 2022-12-11 DIAGNOSIS — E1142 Type 2 diabetes mellitus with diabetic polyneuropathy: Secondary | ICD-10-CM | POA: Diagnosis not present

## 2022-12-11 DIAGNOSIS — Z7984 Long term (current) use of oral hypoglycemic drugs: Secondary | ICD-10-CM | POA: Diagnosis not present

## 2022-12-11 DIAGNOSIS — E785 Hyperlipidemia, unspecified: Secondary | ICD-10-CM | POA: Diagnosis not present

## 2022-12-11 DIAGNOSIS — N400 Enlarged prostate without lower urinary tract symptoms: Secondary | ICD-10-CM | POA: Diagnosis not present

## 2022-12-11 DIAGNOSIS — J449 Chronic obstructive pulmonary disease, unspecified: Secondary | ICD-10-CM | POA: Diagnosis not present

## 2022-12-12 ENCOUNTER — Other Ambulatory Visit: Payer: Self-pay | Admitting: Family Medicine

## 2022-12-12 DIAGNOSIS — E1151 Type 2 diabetes mellitus with diabetic peripheral angiopathy without gangrene: Secondary | ICD-10-CM

## 2022-12-13 NOTE — Telephone Encounter (Signed)
Please call patient to make a fasting appointment this summer. You may wish to call the son.  Dr. Sedalia Muta

## 2022-12-30 ENCOUNTER — Ambulatory Visit: Payer: Medicare HMO | Admitting: Podiatry

## 2022-12-30 DIAGNOSIS — B351 Tinea unguium: Secondary | ICD-10-CM | POA: Diagnosis not present

## 2022-12-30 DIAGNOSIS — L84 Corns and callosities: Secondary | ICD-10-CM

## 2022-12-30 DIAGNOSIS — E1151 Type 2 diabetes mellitus with diabetic peripheral angiopathy without gangrene: Secondary | ICD-10-CM

## 2022-12-30 NOTE — Progress Notes (Signed)
       Subjective:  Patient ID: [name], male    DOB: 1940-11-21,  MRN: [id number]   [name] presents to clinic today for nails that are thick, discolored, elongated and painful in shoegear when trying to ambulate.  His daughter is with him today.  She stated that they live next to each other and she checks on him.  Notes patient has not had a significant appetite as of late.  However, he does like his sweets.  PCP is Cox, Kirsten, MD.  Allergies  Allergen Reactions   Lipitor [Atorvastatin Calcium] Other (See Comments)    Causes memory loss   Crestor [Rosuvastatin Calcium] Other (See Comments)    Causes Muscle Pain   Zocor [Simvastatin] Other (See Comments)    Causes muscle pain    Review of Systems: Negative except as noted in the HPI.  Objective:  There were no vitals filed for this visit.  [name] is a pleasant 82 y.o. male in NAD. AAO x 3.  Vascular Examination: Capillary refill time is 3-5 seconds to toes bilateral. Palpable DP, nonpalpable PT pulse bilateral. Digital hair sparse b/l.  +1 lower extremity edema b/l. Skin temperature gradient WNL b/l. No cyanosis or clubbing noted b/l.   Dermatological Examination: Pedal skin with decreased turgor with atrophic skin changes b/l. No open wounds. No interdigital macerations b/l. Toenails x10 are 3mm thick, discolored, dystrophic with subungual debris. There is pain with compression of the nail plates.  They are elongated x10.  Hyperkeratotic lesion submet 4 right foot with pain on palpation.  No surrounding or IMA or soft tissue breakdown is seen     Latest Ref Rng & Units 08/19/2022    9:22 AM 05/13/2022    8:23 AM 02/03/2022    8:57 AM  Hemoglobin A1C  Hemoglobin-A1c 4.8 - 5.6 % 6.2  6.1  6.4     Assessment/Plan: 1. Type II diabetes mellitus with peripheral circulatory disorder (HCC)   2. Callus of foot   3. Dermatophytosis of nail     The mycotic toenails were sharply debrided x10 with sterile  nail nippers and a power debriding burr to decrease bulk/thickness and length.    The hyperkeratotic lesion was shaved with a sterile #313 blade uneventfully.  Since the patient likes sweet foods and candies, recommended that they try Built bars (coconut puff flavor or milk chocolate puff flavor) that have higher protein levels and lower sugar but still taste like a candy bar.  Return in about 3 months (around 04/01/2023) for [facility]   Clerance Lav, DPM, FACFAS [facility]     [address] [place], [place] [address]                Office [phone]  Fax [phone]

## 2023-01-07 ENCOUNTER — Other Ambulatory Visit: Payer: Self-pay

## 2023-01-07 MED ORDER — ONETOUCH ULTRA VI STRP: ORAL_STRIP | 4 refills | Status: DC

## 2023-01-08 ENCOUNTER — Encounter (HOSPITAL_COMMUNITY): Payer: Medicare HMO | Admitting: Cardiology

## 2023-01-11 ENCOUNTER — Other Ambulatory Visit: Payer: Self-pay

## 2023-01-11 ENCOUNTER — Telehealth: Payer: Self-pay

## 2023-01-11 NOTE — Telephone Encounter (Signed)
HH POC ORDER #: [id number] [facility]

## 2023-01-18 ENCOUNTER — Emergency Department (HOSPITAL_COMMUNITY): Payer: Medicare HMO

## 2023-01-18 ENCOUNTER — Inpatient Hospital Stay (HOSPITAL_COMMUNITY)
Admission: EM | Admit: 2023-01-18 | Discharge: 2023-01-21 | DRG: 193 | Disposition: A | Discharge status: Skilled Nursing Facility | Payer: Medicare HMO | Attending: Family Medicine | Admitting: Family Medicine

## 2023-01-18 ENCOUNTER — Encounter (HOSPITAL_COMMUNITY): Payer: Self-pay | Admitting: Emergency Medicine

## 2023-01-18 DIAGNOSIS — Z1152 Encounter for screening for COVID-19: Secondary | ICD-10-CM

## 2023-01-18 DIAGNOSIS — G9340 Encephalopathy, unspecified: Secondary | ICD-10-CM | POA: Diagnosis present

## 2023-01-18 DIAGNOSIS — R059 Cough, unspecified: Secondary | ICD-10-CM | POA: Diagnosis not present

## 2023-01-18 DIAGNOSIS — R4182 Altered mental status, unspecified: Secondary | ICD-10-CM

## 2023-01-18 DIAGNOSIS — Z79899 Other long term (current) drug therapy: Secondary | ICD-10-CM

## 2023-01-18 DIAGNOSIS — N401 Enlarged prostate with lower urinary tract symptoms: Secondary | ICD-10-CM | POA: Diagnosis present

## 2023-01-18 DIAGNOSIS — G9341 Metabolic encephalopathy: Secondary | ICD-10-CM | POA: Diagnosis present

## 2023-01-18 DIAGNOSIS — E1151 Type 2 diabetes mellitus with diabetic peripheral angiopathy without gangrene: Secondary | ICD-10-CM | POA: Diagnosis present

## 2023-01-18 DIAGNOSIS — S199XXA Unspecified injury of neck, initial encounter: Secondary | ICD-10-CM | POA: Diagnosis not present

## 2023-01-18 DIAGNOSIS — Z794 Long term (current) use of insulin: Secondary | ICD-10-CM

## 2023-01-18 DIAGNOSIS — R296 Repeated falls: Secondary | ICD-10-CM

## 2023-01-18 DIAGNOSIS — S0990XA Unspecified injury of head, initial encounter: Secondary | ICD-10-CM | POA: Diagnosis not present

## 2023-01-18 DIAGNOSIS — J129 Viral pneumonia, unspecified: Secondary | ICD-10-CM | POA: Diagnosis not present

## 2023-01-18 DIAGNOSIS — Z602 Problems related to living alone: Secondary | ICD-10-CM | POA: Diagnosis present

## 2023-01-18 DIAGNOSIS — W19XXXA Unspecified fall, initial encounter: Principal | ICD-10-CM

## 2023-01-18 DIAGNOSIS — I25118 Atherosclerotic heart disease of native coronary artery with other forms of angina pectoris: Secondary | ICD-10-CM | POA: Diagnosis present

## 2023-01-18 DIAGNOSIS — Z9181 History of falling: Secondary | ICD-10-CM

## 2023-01-18 DIAGNOSIS — S01112A Laceration without foreign body of left eyelid and periocular area, initial encounter: Secondary | ICD-10-CM | POA: Diagnosis present

## 2023-01-18 DIAGNOSIS — I5042 Chronic combined systolic (congestive) and diastolic (congestive) heart failure: Secondary | ICD-10-CM | POA: Diagnosis present

## 2023-01-18 DIAGNOSIS — Z955 Presence of coronary angioplasty implant and graft: Secondary | ICD-10-CM

## 2023-01-18 DIAGNOSIS — J206 Acute bronchitis due to rhinovirus: Secondary | ICD-10-CM | POA: Insufficient documentation

## 2023-01-18 DIAGNOSIS — Z7902 Long term (current) use of antithrombotics/antiplatelets: Secondary | ICD-10-CM

## 2023-01-18 DIAGNOSIS — J449 Chronic obstructive pulmonary disease, unspecified: Secondary | ICD-10-CM | POA: Diagnosis present

## 2023-01-18 DIAGNOSIS — R609 Edema, unspecified: Secondary | ICD-10-CM | POA: Diagnosis not present

## 2023-01-18 DIAGNOSIS — E876 Hypokalemia: Secondary | ICD-10-CM | POA: Diagnosis present

## 2023-01-18 DIAGNOSIS — J90 Pleural effusion, not elsewhere classified: Secondary | ICD-10-CM | POA: Diagnosis not present

## 2023-01-18 DIAGNOSIS — Z888 Allergy status to other drugs, medicaments and biological substances status: Secondary | ICD-10-CM

## 2023-01-18 DIAGNOSIS — S0181XA Laceration without foreign body of other part of head, initial encounter: Secondary | ICD-10-CM

## 2023-01-18 DIAGNOSIS — W07XXXA Fall from chair, initial encounter: Secondary | ICD-10-CM | POA: Diagnosis present

## 2023-01-18 DIAGNOSIS — Y92013 Bedroom of single-family (private) house as the place of occurrence of the external cause: Secondary | ICD-10-CM

## 2023-01-18 DIAGNOSIS — I110 Hypertensive heart disease with heart failure: Secondary | ICD-10-CM | POA: Diagnosis present

## 2023-01-18 DIAGNOSIS — I10 Essential (primary) hypertension: Secondary | ICD-10-CM | POA: Diagnosis not present

## 2023-01-18 DIAGNOSIS — J440 Chronic obstructive pulmonary disease with acute lower respiratory infection: Secondary | ICD-10-CM | POA: Diagnosis present

## 2023-01-18 DIAGNOSIS — G319 Degenerative disease of nervous system, unspecified: Secondary | ICD-10-CM | POA: Diagnosis not present

## 2023-01-18 DIAGNOSIS — R58 Hemorrhage, not elsewhere classified: Secondary | ICD-10-CM | POA: Diagnosis not present

## 2023-01-18 DIAGNOSIS — E1142 Type 2 diabetes mellitus with diabetic polyneuropathy: Secondary | ICD-10-CM | POA: Diagnosis present

## 2023-01-18 DIAGNOSIS — E782 Mixed hyperlipidemia: Secondary | ICD-10-CM | POA: Diagnosis present

## 2023-01-18 DIAGNOSIS — Z951 Presence of aortocoronary bypass graft: Secondary | ICD-10-CM

## 2023-01-18 DIAGNOSIS — Z743 Need for continuous supervision: Secondary | ICD-10-CM | POA: Diagnosis not present

## 2023-01-18 DIAGNOSIS — R918 Other nonspecific abnormal finding of lung field: Secondary | ICD-10-CM | POA: Diagnosis not present

## 2023-01-18 DIAGNOSIS — R531 Weakness: Secondary | ICD-10-CM

## 2023-01-18 DIAGNOSIS — I700 Atherosclerosis of aorta: Secondary | ICD-10-CM | POA: Diagnosis not present

## 2023-01-18 DIAGNOSIS — J189 Pneumonia, unspecified organism: Secondary | ICD-10-CM | POA: Insufficient documentation

## 2023-01-18 DIAGNOSIS — S299XXA Unspecified injury of thorax, initial encounter: Secondary | ICD-10-CM | POA: Diagnosis not present

## 2023-01-18 DIAGNOSIS — B9789 Other viral agents as the cause of diseases classified elsewhere: Secondary | ICD-10-CM | POA: Diagnosis present

## 2023-01-18 DIAGNOSIS — J40 Bronchitis, not specified as acute or chronic: Secondary | ICD-10-CM

## 2023-01-18 DIAGNOSIS — Z823 Family history of stroke: Secondary | ICD-10-CM

## 2023-01-18 DIAGNOSIS — I4510 Unspecified right bundle-branch block: Secondary | ICD-10-CM | POA: Diagnosis present

## 2023-01-18 DIAGNOSIS — J168 Pneumonia due to other specified infectious organisms: Secondary | ICD-10-CM | POA: Diagnosis not present

## 2023-01-18 LAB — CBC WITH DIFFERENTIAL/PLATELET
Abs Immature Granulocytes: 0.04 10*3/uL (ref 0.00–0.07)
Basophils Absolute: 0 10*3/uL (ref 0.0–0.1)
Basophils Relative: 1 %
Eosinophils Absolute: 0.2 10*3/uL (ref 0.0–0.5)
Eosinophils Relative: 3 %
HCT: 30.7 % — ABNORMAL LOW (ref 39.0–52.0)
Hemoglobin: 10.5 g/dL — ABNORMAL LOW (ref 13.0–17.0)
Immature Granulocytes: 1 %
Lymphocytes Relative: 16 %
Lymphs Abs: 1.1 10*3/uL (ref 0.7–4.0)
MCH: 34.4 pg — ABNORMAL HIGH (ref 26.0–34.0)
MCHC: 34.2 g/dL (ref 30.0–36.0)
MCV: 100.7 fL — ABNORMAL HIGH (ref 80.0–100.0)
Monocytes Absolute: 0.5 10*3/uL (ref 0.1–1.0)
Monocytes Relative: 8 %
Neutro Abs: 4.8 10*3/uL (ref 1.7–7.7)
Neutrophils Relative %: 71 %
Platelets: 128 10*3/uL — ABNORMAL LOW (ref 150–400)
RBC: 3.05 MIL/uL — ABNORMAL LOW (ref 4.22–5.81)
RDW: 12.9 % (ref 11.5–15.5)
WBC: 6.6 10*3/uL (ref 4.0–10.5)
nRBC: 0 % (ref 0.0–0.2)

## 2023-01-18 LAB — BASIC METABOLIC PANEL
Anion gap: 8 (ref 5–15)
BUN: 11 mg/dL (ref 8–23)
CO2: 22 mmol/L (ref 22–32)
Calcium: 8.8 mg/dL — ABNORMAL LOW (ref 8.9–10.3)
Chloride: 102 mmol/L (ref 98–111)
Creatinine, Ser: 0.88 mg/dL (ref 0.61–1.24)
GFR, Estimated: >60 mL/min (ref >60)
Glucose, Bld: 170 mg/dL — ABNORMAL HIGH (ref 70–99)
Potassium: 3.8 mmol/L (ref 3.5–5.1)
Sodium: 132 mmol/L — ABNORMAL LOW (ref 135–145)

## 2023-01-18 LAB — CBG MONITORING, ED: Glucose-Capillary: 169 mg/dL — ABNORMAL HIGH (ref 70–99)

## 2023-01-18 MED ORDER — IOHEXOL 350 MG/ML SOLN
75.0000 mL | Freq: Once | INTRAVENOUS | Status: AC | PRN
  Administered 2023-01-18: 75 mL via INTRAVENOUS

## 2023-01-18 MED ORDER — EZETIMIBE 10 MG PO TABS
10.0000 mg | ORAL_TABLET | Freq: Every day | ORAL | Status: DC
  Administered 2023-01-19 – 2023-01-21 (×3): 10 mg via ORAL
  Filled 2023-01-18 (×4): qty 1

## 2023-01-18 MED ORDER — MEGESTROL ACETATE 20 MG PO TABS
20.0000 mg | ORAL_TABLET | Freq: Every day | ORAL | Status: DC
  Administered 2023-01-19 – 2023-01-21 (×3): 20 mg via ORAL
  Filled 2023-01-18 (×4): qty 1

## 2023-01-18 MED ORDER — CHOLECALCIFEROL 10 MCG (400 UNIT) PO TABS
400.0000 [IU] | ORAL_TABLET | Freq: Every day | ORAL | Status: DC
  Administered 2023-01-19 – 2023-01-20 (×2): 400 [IU] via ORAL
  Filled 2023-01-18 (×4): qty 1

## 2023-01-18 MED ORDER — METFORMIN HCL 500 MG PO TABS
500.0000 mg | ORAL_TABLET | Freq: Three times a day (TID) | ORAL | Status: DC
  Administered 2023-01-18 – 2023-01-19 (×4): 500 mg via ORAL
  Filled 2023-01-18 (×5): qty 1

## 2023-01-18 MED ORDER — FUROSEMIDE 20 MG PO TABS
20.0000 mg | ORAL_TABLET | ORAL | Status: DC
  Administered 2023-01-20: 20 mg via ORAL
  Filled 2023-01-18 (×3): qty 1

## 2023-01-18 MED ORDER — LIDOCAINE-EPINEPHRINE-TETRACAINE (LET) TOPICAL GEL
3.0000 mL | Freq: Once | TOPICAL | Status: AC
  Administered 2023-01-18: 3 mL via TOPICAL
  Filled 2023-01-18: qty 3

## 2023-01-18 MED ORDER — NITROGLYCERIN 0.4 MG SL SUBL: 0.4000 mg | SUBLINGUAL_TABLET | SUBLINGUAL | Status: DC | PRN

## 2023-01-18 MED ORDER — METOPROLOL SUCCINATE ER 25 MG PO TB24
12.5000 mg | ORAL_TABLET | Freq: Every day | ORAL | Status: DC
  Administered 2023-01-18 – 2023-01-20 (×3): 12.5 mg via ORAL
  Filled 2023-01-18 (×3): qty 1

## 2023-01-18 MED ORDER — POTASSIUM CHLORIDE CRYS ER 20 MEQ PO TBCR
20.0000 meq | EXTENDED_RELEASE_TABLET | ORAL | Status: DC
  Administered 2023-01-20: 20 meq via ORAL
  Filled 2023-01-18 (×3): qty 1

## 2023-01-18 MED ORDER — DIGOXIN 125 MCG PO TABS
0.1250 mg | ORAL_TABLET | Freq: Once | ORAL | Status: DC
  Filled 2023-01-18: qty 1

## 2023-01-18 MED ORDER — ADULT MULTIVITAMIN W/MINERALS CH
1.0000 | ORAL_TABLET | Freq: Every day | ORAL | Status: DC
  Administered 2023-01-19 – 2023-01-21 (×3): 1 via ORAL
  Filled 2023-01-18 (×4): qty 1

## 2023-01-18 NOTE — Progress Notes (Addendum)
[facility] rm [address] [facility]) Hospital Liaison note:  This patient is currently enrolled in [facility] outpatient-based palliative care. [facility] will continue to follow for discharge disposition. Please call for any outpatient based palliative care related questions or concerns.  Thank you, Thea Gist, BSN, RN [phone]

## 2023-01-18 NOTE — ED Triage Notes (Signed)
Lost balance getting into a lift chair, fell and hit the left side of his head/face No c-spine tender, no pain complaints, no C-collar on Pt is on plavix Small avulasion on left side of cheek/face

## 2023-01-18 NOTE — Discharge Planning (Signed)
Pt currently active with [facility] for Home Health services PT as confirmed by [facility] with Marcelino Duster of [facility].  Pt will resume HH services of PT. No DME needs identified at this time.  RNCM following up with [facility] to confirm services.

## 2023-01-18 NOTE — Progress Notes (Signed)
CSW spoke with Velna Hatchet in admissions at [facility] who accepted patient.   CSW contacted Kennith Center in admissions at [facility]. No decision has been made by the DON at this time. CSW was told to follow-up in the morning.   CSW contacted Lauren with admissions at [facility] [phone]. CSW left a message requesting a call back.   CSW updated patients daughter Misty Stanley, [phone]. Family is currently waiting on a response from [facility] to make a decision on SNF placement.

## 2023-01-18 NOTE — Discharge Instructions (Signed)
[facility]  [facility] Address: [address], [place], [place] [address] Phone: [phone]  [facility] Address: [address], [place], [place] [address] Phone: [phone]  [facility] Address: [address], [place], [place] [address] Phone: [phone]  [facility] Address: [address], [place], [place] [address] Phone: [phone]  [facility] Address: [address], [place], [place] [address] Phone: [phone]  [facility] Phone: [phone]  [facility] Address:  [address], [place], [place] [address] Phone:  [phone]  [facility] Address:  [address] Phone:  [phone]  [email or web address]  [facility]  Phone: [phone]   

## 2023-01-18 NOTE — ED Provider Notes (Signed)
[place] EMERGENCY DEPARTMENT AT [facility] Provider Note   CSN: [id number] Arrival date & time: 01/18/23  4098     History Chief Complaint  Patient presents with   Marletta Lor    [name] is a 82 y.o. male. Patient with past history significant for coronary artery disease, hyperlipidemia, type 2 diabetes who presented to the ED following a mechanical fall.  Patient reports that he was trying to get onto a chairlift at home when he fell onto the left side of his face after losing his grip and denies any syncopal episode. Patient lives at home. Complaining of a small laceration to the left eyebrow and some slight bruising to the left side of face.  GCS 15.  Has a history of frequent falls as well.  Patient denies any acute pain anywhere else.  Patient is reportedly supposed to be on hospice home care.   Fall       Home Medications Prior to Admission medications   Medication Sig Start Date End Date Taking? Authorizing Provider  cholecalciferol (VITAMIN D3) 25 MCG (1000 UT) tablet Take 5,000 Units by mouth daily.    [provider]  clopidogrel (PLAVIX) 75 MG tablet Take 1 tablet (75 mg total) by mouth daily. 01/30/22   Marrianne Mood, MD  digoxin (LANOXIN) 0.125 MG tablet Take 1 tablet (0.125 mg total) by mouth daily. 05/29/22   Laurey Morale, MD  ezetimibe (ZETIA) 10 MG tablet Take 1 tablet by mouth once daily 12/11/22   Nahser, Deloris Ping, MD  furosemide (LASIX) 20 MG tablet Take 1 tablet (20 mg total) by mouth every Monday, Wednesday, and Friday. 08/24/22   Milford, Anderson Malta, FNP  glucose blood (ONETOUCH ULTRA) test strip AS DIRECTED 01/07/23   Cox, Fritzi Mandes, MD  Insulin Pen Needle (BD PEN NEEDLE NANO U/F) 32G X 4 MM MISC 1 each by Does not apply route daily. 03/03/22   [name], MD  Lancets MISC 1 each by Does not apply route daily as needed. Please provide lancets that fit patients lancet device 10/09/19   [name], MD  megestrol  (MEGACE) 40 MG tablet Take 1 tablet (40 mg total) by mouth daily. 11/24/22   Blane Ohara, MD  metFORMIN (GLUCOPHAGE) 500 MG tablet Take 1 tablet by mouth 4 times daily 12/13/22   Cox, Kirsten, MD  metoprolol succinate (TOPROL-XL) 25 MG 24 hr tablet Take 0.5 tablets (12.5 mg total) by mouth at bedtime. 08/21/22   Jacklynn Ganong, FNP  Multiple Vitamin (MULTIVITAMIN) tablet Take 1 tablet by mouth daily.    [provider]  nitroGLYCERIN (NITROSTAT) 0.4 MG SL tablet Place 1 tablet (0.4 mg total) under the tongue every 5 (five) minutes as needed for chest pain. 02/02/22 05/30/23  Nahser, Deloris Ping, MD  potassium chloride SA (KLOR-CON M) 20 MEQ tablet Take 1 tablet (20 mEq total) by mouth every Monday, Wednesday, and Friday. 08/24/22   Milford, Anderson Malta, FNP  Vericiguat (VERQUVO) 2.5 MG TABS Take 2.5 mg by mouth daily. 05/29/22   Laurey Morale, MD      Allergies    Lipitor [atorvastatin calcium], Crestor [rosuvastatin calcium], and Zocor [simvastatin]    Review of Systems   Review of Systems  Constitutional:        Fall  Skin:  Positive for wound.  All other systems reviewed and are negative.   Physical Exam Updated Vital Signs BP 124/72 Comment: manual  Pulse 93   Temp 98.3 F (  36.8 C)   Resp 16   SpO2 97%  Physical Exam Vitals and nursing note reviewed.  Constitutional:      General: He is not in acute distress.    Appearance: He is well-developed.  HENT:     Head: Normocephalic and atraumatic.  Eyes:     General: No scleral icterus.       Right eye: No discharge.        Left eye: No discharge.     Extraocular Movements: Extraocular movements intact.     Conjunctiva/sclera: Conjunctivae normal.     Pupils: Pupils are equal, round, and reactive to light.  Cardiovascular:     Rate and Rhythm: Normal rate and regular rhythm.     Heart sounds: No murmur heard. Pulmonary:     Effort: Pulmonary effort is normal. No respiratory distress.     Breath sounds: Normal breath  sounds.  Abdominal:     Palpations: Abdomen is soft.     Tenderness: There is no abdominal tenderness.  Musculoskeletal:        General: No swelling, tenderness, deformity or signs of injury. Normal range of motion.     Cervical back: Neck supple.  Skin:    General: Skin is warm and dry.     Capillary Refill: Capillary refill takes less than 2 seconds.          Comments: 2 cm laceration noted to the inferior aspect of the left eyebrow.  Bruising noted to left side of face.  Neurological:     General: No focal deficit present.     Mental Status: He is alert and oriented to person, place, and time. Mental status is at baseline.     Cranial Nerves: No cranial nerve deficit.     Motor: No weakness.  Psychiatric:        Mood and Affect: Mood normal.     ED Results / Procedures / Treatments   Labs (all labs ordered are listed, but only abnormal results are displayed) Labs Reviewed  CBC WITH DIFFERENTIAL/PLATELET  BASIC METABOLIC PANEL  CBG MONITORING, ED    EKG None  Radiology No results found.  Procedures .Marland KitchenLaceration Repair  Date/Time: 01/18/2023 7:47 AM  Performed by: Smitty Knudsen, PA-C Authorized by: Smitty Knudsen, PA-C   Consent:    Consent obtained:  Verbal   Consent given by:  Patient   Risks, benefits, and alternatives were discussed: yes     Risks discussed:  Infection and pain   Alternatives discussed:  No treatment Universal protocol:    Patient identity confirmed:  Verbally with patient Anesthesia:    Anesthesia method:  Topical application Laceration details:    Location:  Face   Face location:  L eyebrow   Length (cm):  2   Depth (mm):  1 Pre-procedure details:    Preparation:  Patient was prepped and draped in usual sterile fashion Exploration:    Limited defect created (wound extended): no     Hemostasis achieved with:  LET   Contaminated: no   Treatment:    Area cleansed with:  Chlorhexidine   Amount of cleaning:  Standard    Irrigation solution:  Sterile saline   Irrigation volume:  100cc   Irrigation method:  Syringe   Visualized foreign bodies/material removed: no     Debridement:  None   Undermining:  None Skin repair:    Repair method:  Sutures   Suture size:  5-0   Suture material:  Chromic  gut   Suture technique:  Simple interrupted   Number of sutures:  2 Approximation:    Approximation:  Close Repair type:    Repair type:  Simple Post-procedure details:    Dressing:  Open (no dressing)   Procedure completion:  Tolerated    Medications Ordered in ED Medications - No data to display  ED Course/ Medical Decision Making/ A&P                           Medical Decision Making Amount and/or Complexity of Data Reviewed Radiology: ordered.  Risk Prescription drug management.   This patient presents to the ED for concern of fall.  Differential diagnosis includes hip fracture, SAH, concussion, laceration, syncope   Lab Tests:  I Ordered, and personally interpreted labs.  The pertinent results include: CBC with mild anemia noted, CMP with mild hyponatremia at 132, renal function intact, CBG slightly elevated at 169   Imaging Studies ordered:  I ordered imaging studies including CT head, CT cervical spine, chest x-ray I independently visualized and interpreted imaging which showed ET head and CT cervical spine negative for any acute injuries, chest x-ray concerning for severe bronchitis with developing bronchopneumonia I agree with the radiologist interpretation   Medicines ordered and prescription drug management:  I ordered medication including ***  for ***  Reevaluation of the patient after these medicines showed that the patient {resolved/improved/worsened:23923::"improved"} I have reviewed the patients home medicines and have made adjustments as needed   Problem List / ED Course:  Patient presented to the emergency department complaints of a mechanical fall.  Past history  significant for coronary artery disease, hyperlipidemia, type 2 diabetes.  I presented at bedside for initial trauma evaluation with patient which did reveal a laceration to the left eyebrow but no other significant injuries.  Some bruising noted to the left face but no pain in this area on palpation.  Will evaluate with x-ray imaging of head, cervical spine, chest.  Basic labs also ordered. Labs unremarkable with mild anemia noted and no significant electrolyte or renal impairment BMP.  CBG slightly elevated.  Will apply let gel and perform suture repair. Patient tolerated suture repair without difficulty.  Discussed continuing care with patient's daughter who is at bedside.  Concern for worsening status although he is currently being seen by PT at home 2 to 3 days/week.  No home health care nurse present at home at this time.  Will consult with social work for further evaluation and recommendations.  Family currently does not feel that SNF placement is needed. LCSW evaluated patient and discussed needs with family. Family now wanting placement. Given that placement will be boarding, will order CT of chest for further evaluation if pneumonia is detected that would require medical admission. Patient's family is agreeable with current plan.   Final Clinical Impression(s) / ED Diagnoses Final diagnoses:  None    Rx / DC Orders ED Discharge Orders     None

## 2023-01-18 NOTE — ED Notes (Signed)
Pt has an avulsion on the left side of face that track from the jaw line up towards the temple on the left side of his face. Did not come in with C-collar from [facility]. MD present at pt arrival time. No complaints of C-spine tenderness with evaluation by providers.

## 2023-01-18 NOTE — Evaluation (Signed)
Physical Therapy Evaluation Patient Details Name: [name] MRN: [id number] DOB: 1940/10/23 Today's Date: 01/18/2023  History of Present Illness  82 yo male presents to [facility] on 7/8 with fall hitting L side of face. CTH and neck negative for acute findings. PMH includes: CAD s/p CABG, COPD, HLD, and DM II. CT cervical spine 2018: Severe degenerative disc disease at C6-7  Clinical Impression   Pt presents with generalized weakness, impaired balance with heavy posterior bias and history of multiple falls, decreased activity tolerance. Pt to benefit from acute PT to address deficits. Pt requiring mod physical assist for bed mobility and x2 transfers at EOB, pt limited in progression given LE weakness and heavy posterior bias difficult to correct even with max cues. Pt lives alone in a tiny home on his daughter's property, does not have constant supervision or assist. PT recommending low-intensity inpatient rehabilitation post-acutely. PT to progress mobility as tolerated, and will continue to follow acutely.          Assistance Recommended at Discharge PRN  If plan is discharge home, recommend the following:  Can travel by private vehicle  A lot of help with walking and/or transfers;A lot of help with bathing/dressing/bathroom        Equipment Recommendations None recommended by PT  Recommendations for Other Services       Functional Status Assessment Patient has had a recent decline in their functional status and demonstrates the ability to make significant improvements in function in a reasonable and predictable amount of time.     Precautions / Restrictions Precautions Precautions: Fall Restrictions Weight Bearing Restrictions: No      Mobility  Bed Mobility Overal bed mobility: Needs Assistance Bed Mobility: Supine to Sit, Sit to Supine     Supine to sit: Mod assist, HOB elevated Sit to supine: Mod assist, HOB elevated   General bed mobility comments: assist for  trunk elevation, LE lift back into bed    Transfers Overall transfer level: Needs assistance Equipment used: Rolling walker (2 wheels) Transfers: Sit to/from Stand, Bed to chair/wheelchair/BSC Sit to Stand: Mod assist   Step pivot transfers: Mod assist, From elevated surface       General transfer comment: assist for rise and steady, heavy posterior bias once standing and max cues for anterior leaning. Stand x2, lateral stepping towards HOB x3 with increasing posterior bias with fatigue.    Ambulation/Gait                  Stairs            Wheelchair Mobility     Tilt Bed    Modified Rankin (Stroke Patients Only)       Balance Overall balance assessment: Needs assistance, History of Falls Sitting-balance support: No upper extremity supported, Feet supported Sitting balance-Leahy Scale: Fair     Standing balance support: Bilateral upper extremity supported, During functional activity, Reliant on assistive device for balance Standing balance-Leahy Scale: Poor                               Pertinent Vitals/Pain Pain Assessment Pain Assessment: No/denies pain Faces Pain Scale: No hurt Pain Intervention(s): Monitored during session    Home Living Family/patient expects to be discharged to:: Private residence Living Arrangements: Alone Available Help at Discharge: Family;Available PRN/intermittently Type of Home: House (tiny home) Home Access: Ramped entrance;Stairs to enter       Home Layout: One level  Home Equipment: Agricultural consultant (2 wheels);Cane - single point      Prior Function Prior Level of Function : Independent/Modified Independent;History of Falls (last six months)             Mobility Comments: history of multiple falls, states he lives in a tiny home on his daughter's property and she works during the day. Pt uses RW       Hand Dominance   Dominant Hand: Right    Extremity/Trunk Assessment   Upper Extremity  Assessment Upper Extremity Assessment: Defer to OT evaluation    Lower Extremity Assessment Lower Extremity Assessment: Generalized weakness    Cervical / Trunk Assessment Cervical / Trunk Assessment: Kyphotic  Communication   Communication: HOH  Cognition Arousal/Alertness: Awake/alert Behavior During Therapy: WFL for tasks assessed/performed Overall Cognitive Status: Impaired/Different from baseline Area of Impairment: Orientation, Following commands                 Orientation Level: Disoriented to, Time     Following Commands: Follows one step commands with increased time       General Comments: pt states year is 2023, then said "I had a lot of falls in 2003" suspect he was referring to this year and last        General Comments      Exercises     Assessment/Plan    PT Assessment Patient needs continued PT services  PT Problem List Decreased strength;Decreased mobility;Decreased activity tolerance;Decreased balance;Decreased knowledge of use of DME;Decreased safety awareness       PT Treatment Interventions DME instruction;Therapeutic activities;Patient/family education;Therapeutic exercise;Gait training;Balance training;Functional mobility training;Neuromuscular re-education    PT Goals (Current goals can be found in the Care Plan section)  Acute Rehab PT Goals PT Goal Formulation: With patient Time For Goal Achievement: 02/01/23 Potential to Achieve Goals: Good    Frequency Min 2X/week     Co-evaluation               AM-PAC PT "6 Clicks" Mobility  Outcome Measure Help needed turning from your back to your side while in a flat bed without using bedrails?: A Little Help needed moving from lying on your back to sitting on the side of a flat bed without using bedrails?: A Lot Help needed moving to and from a bed to a chair (including a wheelchair)?: A Lot Help needed standing up from a chair using your arms (e.g., wheelchair or bedside  chair)?: A Lot Help needed to walk in hospital room?: Total Help needed climbing 3-5 steps with a railing? : Total 6 Click Score: 11    End of Session   Activity Tolerance: Patient limited by fatigue Patient left: in bed;with call bell/phone within reach;with family/visitor present;Other (comment) (in ED on stretcher) Nurse Communication: Mobility status (NT) PT Visit Diagnosis: Other abnormalities of gait and mobility (R26.89);Muscle weakness (generalized) (M62.81)    Time: 1610-9604 PT Time Calculation (min) (ACUTE ONLY): 18 min   Charges:   PT Evaluation $PT Eval Low Complexity: 1 Low   PT General Charges $$ ACUTE PT VISIT: 1 Visit         Marye Round, PT DPT Acute Rehabilitation Services Secure [facility]  Office [phone]   Lilja Soland Sheliah Plane 01/18/2023, 12:42 PM

## 2023-01-18 NOTE — TOC Initial Note (Signed)
Transition of Care [facility]) - Initial/Assessment Note    Patient Details  Name: [name] MRN: [id number] Date of Birth: Jan 30, 1941  [facility] of [facility]) CM/SW Contact:    Susa Simmonds, LCSWA Phone Number: 01/18/2023, 10:27 AM  Clinical Narrative:  CSW spoke with patient and his daughter [name] at bedside who stated that patient lives on her property in a Tiny home. [name] stated her father has had increasing falls and feels he would benefit from rehab. [name] stated her father currently has home health with [facility] and they come out 2-3 times per week for physical therapy. The family is also looking for possible home aide services in the home as well. Patient doesn't have medicaid at this time. PT consult has been ordered.    Expected Discharge Plan: Skilled Nursing Facility Barriers to Discharge: Continued Medical Work up   Patient Goals and CMS Choice Patient states their goals for this hospitalization and ongoing recovery are:: Return home          Expected Discharge Plan and Services       Living arrangements for the past 2 months: Single Family Home                                      Prior Living Arrangements/Services Living arrangements for the past 2 months: Single Family Home Lives with:: Self Patient language and need for interpreter reviewed:: No Do you feel safe going back to the place where you live?: Yes      Need for Family Participation in Patient Care: Yes (Comment) Care giver support system in place?: Yes (comment) Current home services: DME, Home PT Criminal Activity/Legal Involvement Pertinent to Current Situation/Hospitalization: No - Comment as needed  Activities of Daily Living      Permission Sought/Granted Permission sought to share information with : Family Supports                Emotional Assessment Appearance:: Appears stated age Attitude/Demeanor/Rapport: Engaged Affect (typically observed): Calm Orientation: :  Oriented to Self, Oriented to Place, Oriented to  Time, Oriented to Situation Alcohol / Substance Use: Not Applicable Psych Involvement: No (comment)  Admission diagnosis:  FOT; Head Injury Patient Active Problem List   Diagnosis Date Noted   Muscular deconditioning 08/22/2022   Falls frequently 08/22/2022   Adult BMI <19 kg/sq m 03/03/2022   Mitral regurgitation 01/26/2022   Multifocal atrial tachycardia 01/24/2022   Fall at home 10/08/2021   Generalized weakness 11/14/2020   Statin myopathy 04/15/2020   Diabetic polyneuropathy (HCC) 12/05/2019   Benign prostatic hyperplasia with lower urinary tract symptoms 12/04/2019   Type 2 diabetes mellitus with diabetic peripheral angiopathy without gangrene (HCC) 12/04/2019   Chronic obstructive pulmonary disease (HCC) 12/04/2019   Mixed hyperlipidemia 01/29/2017   Idiopathic thrombocytopenic purpura (HCC) 01/26/2012   Hypertensive heart disease with CHF (HCC) 07/08/2010   DIVERTICULOSIS-COLON 05/12/2010   Diaphragmatic hernia 03/25/2010   Coronary artery disease of native artery of native heart with stable angina pectoris (HCC)    PCP:  Blane Ohara, MD Pharmacy:   [facility] [address], [place] - [address] [facility] [place] [address] Phone: [phone] Fax: [phone]     Social Determinants of Health (SDOH) Social History: SDOH Screenings   Food Insecurity: No Food Insecurity (06/15/2022)  Housing: Low Risk  (01/26/2022)  Transportation Needs: No Transportation Needs (06/15/2022)  Utilities:  Not At Risk (08/19/2022)  Alcohol Screen: Low Risk  (01/26/2022)  Depression (PHQ2-9): Low Risk  (09/11/2022)  Financial Resource Strain: Low Risk  (01/26/2022)  Physical Activity: Sufficiently Active (08/19/2022)  Social Connections: Moderately Isolated (08/19/2022)  Stress: No Stress Concern Present (08/19/2022)  Tobacco Use: Low Risk  (01/18/2023)   SDOH Interventions:     Readmission Risk Interventions      No data to display

## 2023-01-18 NOTE — [place] FL2 (Signed)
[place] MEDICAID FL2 LEVEL OF CARE FORM     IDENTIFICATION  Patient Name: [name] Birthdate: 05/08/1941 Sex: male Admission Date (Current Location): 01/18/2023  [facility] and [place] Number:  Producer, television/film/video and Address:  [facility] [place]. [facility], [address] [place], [place] [address]      Provider Number: [id number]  Attending Physician Name and Address:  Tegeler, Canary Brim, *  Relative Name and Phone Number:  [name] (Daughter)  [phone]    Current Level of Care: Hospital Recommended Level of Care: Skilled Nursing Facility Prior Approval Number:    Date Approved/Denied:   PASRR Number: [id number]  Discharge Plan: SNF    Current Diagnoses: Patient Active Problem List   Diagnosis Date Noted   Muscular deconditioning 08/22/2022   Falls frequently 08/22/2022   Adult BMI <19 kg/sq m 03/03/2022   Mitral regurgitation 01/26/2022   Multifocal atrial tachycardia 01/24/2022   Fall at home 10/08/2021   Generalized weakness 11/14/2020   Statin myopathy 04/15/2020   Diabetic polyneuropathy (HCC) 12/05/2019   Benign prostatic hyperplasia with lower urinary tract symptoms 12/04/2019   Type 2 diabetes mellitus with diabetic peripheral angiopathy without gangrene (HCC) 12/04/2019   Chronic obstructive pulmonary disease (HCC) 12/04/2019   Mixed hyperlipidemia 01/29/2017   Idiopathic thrombocytopenic purpura (HCC) 01/26/2012   Hypertensive heart disease with CHF (HCC) 07/08/2010   DIVERTICULOSIS-COLON 05/12/2010   Diaphragmatic hernia 03/25/2010   Coronary artery disease of native artery of native heart with stable angina pectoris (HCC)     Orientation RESPIRATION BLADDER Height & Weight     Self, Time, Situation, Place  Normal Continent Weight: 146 lbs  Height:  5'9   BEHAVIORAL SYMPTOMS/MOOD NEUROLOGICAL BOWEL NUTRITION STATUS      Continent Diet (Regular)  AMBULATORY STATUS COMMUNICATION OF NEEDS Skin   Extensive Assist Verbally  Normal                       Personal Care Assistance Level of Assistance  Bathing, Feeding, Dressing Bathing Assistance: Maximum assistance Feeding assistance: Independent Dressing Assistance: Maximum assistance     Functional Limitations Info  Sight, Hearing, Speech Sight Info: Impaired (Wears glasses) Hearing Info: Adequate Speech Info: Adequate    SPECIAL CARE FACTORS FREQUENCY                       Contractures Contractures Info: Not present    Additional Factors Info  Code Status, Allergies Code Status Info: Full Allergies Info: Lipitor (atorvastatin Calcium), Crestor (rosuvastatin Calcium), Zocor (simvastatin)           Current Medications (01/18/2023):  This is the current hospital active medication list No current facility-administered medications for this encounter.   Current Outpatient Medications  Medication Sig Dispense Refill   cholecalciferol (VITAMIN D3) 25 MCG (1000 UT) tablet Take 5,000 Units by mouth daily.     clopidogrel (PLAVIX) 75 MG tablet Take 1 tablet (75 mg total) by mouth daily. 90 tablet 3   digoxin (LANOXIN) 0.125 MG tablet Take 1 tablet (0.125 mg total) by mouth daily. 90 tablet 3   ezetimibe (ZETIA) 10 MG tablet Take 1 tablet by mouth once daily 90 tablet 0   furosemide (LASIX) 20 MG tablet Take 1 tablet (20 mg total) by mouth every Monday, Wednesday, and Friday. 45 tablet 3   glucose blood (ONETOUCH ULTRA) test strip AS DIRECTED 100 each 4   Insulin Pen Needle (BD PEN NEEDLE NANO U/F) 32G  X 4 MM MISC 1 each by Does not apply route daily. 100 each 3   Lancets MISC 1 each by Does not apply route daily as needed. Please provide lancets that fit patients lancet device 100 each 4   megestrol (MEGACE) 40 MG tablet Take 1 tablet (40 mg total) by mouth daily. 30 tablet 2   metFORMIN (GLUCOPHAGE) 500 MG tablet Take 1 tablet by mouth 4 times daily 720 tablet 0   metoprolol succinate (TOPROL-XL) 25 MG 24 hr tablet Take 0.5 tablets (12.5 mg  total) by mouth at bedtime. 30 tablet 3   Multiple Vitamin (MULTIVITAMIN) tablet Take 1 tablet by mouth daily.     nitroGLYCERIN (NITROSTAT) 0.4 MG SL tablet Place 1 tablet (0.4 mg total) under the tongue every 5 (five) minutes as needed for chest pain. 90 tablet 3   potassium chloride SA (KLOR-CON M) 20 MEQ tablet Take 1 tablet (20 mEq total) by mouth every Monday, Wednesday, and Friday. 45 tablet 3   Vericiguat (VERQUVO) 2.5 MG TABS Take 2.5 mg by mouth daily. 30 tablet 11     Discharge Medications: Please see discharge summary for a list of discharge medications.  Relevant Imaging Results:  Relevant Lab Results:   Additional Information SSN: [id number]  Susa Simmonds, LCSWA

## 2023-01-18 NOTE — Discharge Planning (Signed)
Pt active with [facility] for home palliative care as confirmed with Raynelle Fanning B.  Pt has follow-up visit scheduled for tomorrow.  RNCM made them aware that pt is in ED.

## 2023-01-18 NOTE — ED Notes (Signed)
Patient transported to CT 

## 2023-01-18 NOTE — Progress Notes (Signed)
Orthopedic Tech Progress Note Patient Details:  [name] 15-Jul-1940 [id number]  Patient ID: [name], male   DOB: [birth year], 82 y.o.   MRN: [id number] I attended trauma page. Trinna Post 01/18/2023, 7:10 AM

## 2023-01-18 NOTE — Progress Notes (Signed)
French Ana with admissions from [facility] is currently reviewing patient.

## 2023-01-18 NOTE — Progress Notes (Signed)
   01/18/23 0727  Spiritual Encounters  Type of Visit Initial  Care provided to: Patient  Conversation partners present during encounter Other (comment) (Paramedic)  Referral source Trauma page  Reason for visit Trauma  OnCall Visit Yes  Interventions  Spiritual Care Interventions Made Established relationship of care and support;Compassionate presence;Reflective listening  Intervention Outcomes  Outcomes Connection to spiritual care;Awareness of support;Reduced anxiety;Reduced isolation  Spiritual Care Plan  Spiritual Care Issues Still Outstanding No further spiritual care needs at this time (see row info)   Patient is a level 2 trauma fall on thinners. Chaplain greeted patient and spoke with him briefly about his fall. Patient thanked chaplain for her presence and declined further spiritual support.   Arlyce Dice, Chaplain Resident [phone]

## 2023-01-19 ENCOUNTER — Other Ambulatory Visit: Payer: Medicare HMO

## 2023-01-19 ENCOUNTER — Emergency Department (HOSPITAL_COMMUNITY): Payer: Medicare HMO

## 2023-01-19 ENCOUNTER — Other Ambulatory Visit: Payer: Self-pay

## 2023-01-19 DIAGNOSIS — E782 Mixed hyperlipidemia: Secondary | ICD-10-CM | POA: Diagnosis not present

## 2023-01-19 DIAGNOSIS — I110 Hypertensive heart disease with heart failure: Secondary | ICD-10-CM

## 2023-01-19 DIAGNOSIS — G9340 Encephalopathy, unspecified: Secondary | ICD-10-CM | POA: Diagnosis not present

## 2023-01-19 DIAGNOSIS — R059 Cough, unspecified: Secondary | ICD-10-CM | POA: Diagnosis not present

## 2023-01-19 DIAGNOSIS — R4182 Altered mental status, unspecified: Secondary | ICD-10-CM | POA: Diagnosis not present

## 2023-01-19 DIAGNOSIS — J168 Pneumonia due to other specified infectious organisms: Secondary | ICD-10-CM | POA: Diagnosis not present

## 2023-01-19 DIAGNOSIS — J90 Pleural effusion, not elsewhere classified: Secondary | ICD-10-CM | POA: Diagnosis not present

## 2023-01-19 DIAGNOSIS — J410 Simple chronic bronchitis: Secondary | ICD-10-CM | POA: Diagnosis not present

## 2023-01-19 DIAGNOSIS — R296 Repeated falls: Secondary | ICD-10-CM | POA: Diagnosis not present

## 2023-01-19 DIAGNOSIS — I5089 Other heart failure: Secondary | ICD-10-CM

## 2023-01-19 DIAGNOSIS — S01112A Laceration without foreign body of left eyelid and periocular area, initial encounter: Secondary | ICD-10-CM | POA: Diagnosis not present

## 2023-01-19 DIAGNOSIS — I5022 Chronic systolic (congestive) heart failure: Secondary | ICD-10-CM

## 2023-01-19 DIAGNOSIS — I25118 Atherosclerotic heart disease of native coronary artery with other forms of angina pectoris: Secondary | ICD-10-CM | POA: Diagnosis not present

## 2023-01-19 DIAGNOSIS — J40 Bronchitis, not specified as acute or chronic: Secondary | ICD-10-CM | POA: Diagnosis not present

## 2023-01-19 DIAGNOSIS — N401 Enlarged prostate with lower urinary tract symptoms: Secondary | ICD-10-CM

## 2023-01-19 DIAGNOSIS — E1142 Type 2 diabetes mellitus with diabetic polyneuropathy: Secondary | ICD-10-CM

## 2023-01-19 DIAGNOSIS — R531 Weakness: Secondary | ICD-10-CM | POA: Diagnosis not present

## 2023-01-19 DIAGNOSIS — J189 Pneumonia, unspecified organism: Secondary | ICD-10-CM | POA: Insufficient documentation

## 2023-01-19 DIAGNOSIS — E1151 Type 2 diabetes mellitus with diabetic peripheral angiopathy without gangrene: Secondary | ICD-10-CM

## 2023-01-19 LAB — CBC WITH DIFFERENTIAL/PLATELET
Abs Immature Granulocytes: 0.02 10*3/uL (ref 0.00–0.07)
Basophils Absolute: 0 10*3/uL (ref 0.0–0.1)
Basophils Relative: 0 %
Eosinophils Absolute: 0.1 10*3/uL (ref 0.0–0.5)
Eosinophils Relative: 2 %
HCT: 30.7 % — ABNORMAL LOW (ref 39.0–52.0)
Hemoglobin: 10.4 g/dL — ABNORMAL LOW (ref 13.0–17.0)
Immature Granulocytes: 0 %
Lymphocytes Relative: 21 %
Lymphs Abs: 1.3 10*3/uL (ref 0.7–4.0)
MCH: 32.9 pg (ref 26.0–34.0)
MCHC: 33.9 g/dL (ref 30.0–36.0)
MCV: 97.2 fL (ref 80.0–100.0)
Monocytes Absolute: 0.7 10*3/uL (ref 0.1–1.0)
Monocytes Relative: 11 %
Neutro Abs: 4.1 10*3/uL (ref 1.7–7.7)
Neutrophils Relative %: 66 %
Platelets: 124 10*3/uL — ABNORMAL LOW (ref 150–400)
RBC: 3.16 MIL/uL — ABNORMAL LOW (ref 4.22–5.81)
RDW: 12.7 % (ref 11.5–15.5)
WBC: 6.2 10*3/uL (ref 4.0–10.5)
nRBC: 0 % (ref 0.0–0.2)

## 2023-01-19 LAB — PROCALCITONIN: Procalcitonin: <0.1 ng/mL

## 2023-01-19 LAB — URINALYSIS, W/ REFLEX TO CULTURE (INFECTION SUSPECTED)
Bacteria, UA: NONE SEEN
Bilirubin Urine: NEGATIVE
Glucose, UA: NEGATIVE mg/dL
Ketones, ur: NEGATIVE mg/dL
Leukocytes,Ua: NEGATIVE
Nitrite: NEGATIVE
Protein, ur: NEGATIVE mg/dL
Specific Gravity, Urine: 1.008 (ref 1.005–1.030)
pH: 7 (ref 5.0–8.0)

## 2023-01-19 LAB — RESPIRATORY PANEL BY PCR

## 2023-01-19 LAB — BASIC METABOLIC PANEL
Anion gap: 8 (ref 5–15)
BUN: 11 mg/dL (ref 8–23)
CO2: 22 mmol/L (ref 22–32)
Calcium: 8.7 mg/dL — ABNORMAL LOW (ref 8.9–10.3)
Chloride: 104 mmol/L (ref 98–111)
Creatinine, Ser: 1.05 mg/dL (ref 0.61–1.24)
GFR, Estimated: >60 mL/min (ref >60)
Glucose, Bld: 148 mg/dL — ABNORMAL HIGH (ref 70–99)
Potassium: 3.8 mmol/L (ref 3.5–5.1)
Sodium: 134 mmol/L — ABNORMAL LOW (ref 135–145)

## 2023-01-19 LAB — SARS CORONAVIRUS 2 BY RT PCR: SARS Coronavirus 2 by RT PCR: NEGATIVE

## 2023-01-19 MED ORDER — SODIUM CHLORIDE 0.9% FLUSH
3.0000 mL | Freq: Two times a day (BID) | INTRAVENOUS | Status: DC
  Administered 2023-01-19 – 2023-01-21 (×4): 3 mL via INTRAVENOUS

## 2023-01-19 MED ORDER — DOXYCYCLINE HYCLATE 100 MG PO TABS
100.0000 mg | ORAL_TABLET | Freq: Once | ORAL | Status: AC
  Administered 2023-01-19: 100 mg via ORAL
  Filled 2023-01-19: qty 1

## 2023-01-19 MED ORDER — ENOXAPARIN SODIUM 40 MG/0.4ML IJ SOSY
40.0000 mg | PREFILLED_SYRINGE | INTRAMUSCULAR | Status: DC
  Administered 2023-01-19 – 2023-01-20 (×2): 40 mg via SUBCUTANEOUS
  Filled 2023-01-19 (×2): qty 0.4

## 2023-01-19 MED ORDER — METFORMIN HCL 500 MG PO TABS
500.0000 mg | ORAL_TABLET | Freq: Three times a day (TID) | ORAL | Status: DC
  Administered 2023-01-20 – 2023-01-21 (×4): 500 mg via ORAL
  Filled 2023-01-19 (×4): qty 1

## 2023-01-19 MED ORDER — POLYETHYLENE GLYCOL 3350 17 G PO PACK: 17.0000 g | PACK | Freq: Every day | ORAL | Status: DC | PRN

## 2023-01-19 MED ORDER — CLOPIDOGREL BISULFATE 75 MG PO TABS
75.0000 mg | ORAL_TABLET | Freq: Every day | ORAL | Status: DC
  Administered 2023-01-19 – 2023-01-21 (×3): 75 mg via ORAL
  Filled 2023-01-19 (×3): qty 1

## 2023-01-19 MED ORDER — ACETAMINOPHEN 325 MG PO TABS: 650.0000 mg | ORAL_TABLET | Freq: Four times a day (QID) | ORAL | Status: DC | PRN

## 2023-01-19 MED ORDER — ACETAMINOPHEN 650 MG RE SUPP: 650.0000 mg | Freq: Four times a day (QID) | RECTAL | Status: DC | PRN

## 2023-01-19 MED ORDER — VERICIGUAT 2.5 MG PO TABS
2.5000 mg | ORAL_TABLET | Freq: Every day | ORAL | Status: DC
  Administered 2023-01-20: 2.5 mg via ORAL
  Filled 2023-01-19 (×3): qty 1

## 2023-01-19 MED ORDER — SODIUM CHLORIDE 0.9 % IV SOLN
1.0000 g | Freq: Once | INTRAVENOUS | Status: AC
  Administered 2023-01-19: 1 g via INTRAVENOUS
  Filled 2023-01-19: qty 10

## 2023-01-19 NOTE — ED Notes (Signed)
Notified phlebotomy of need for culture draw

## 2023-01-19 NOTE — H&P (Signed)
History and Physical   ACHYUTH WILBOURNE RUE:[id number] DOB: Jun 06, 1941 DOA: 01/18/2023  PCP: Blane Ohara, MD   Patient coming from: Home  Chief Complaint: Fall, now confusion  HPI: ROLLAN ELSENPETER is a 82 y.o. male with medical history significant of diabetes, neuropathy, hypertension, hyperlipidemia, CHF, weakness, falls, statin myopathy, CAD, COPD, BPH, MAT presenting after a fall and now developing encephalopathy.  Patient initially presented to the ED yesterday evening when he fell trying to get in a chair left.  He fell on the left side of his face did not have loss of consciousness.  Did have a small laceration and some bruising.  Laceration was repaired without issue.  Patient lives alone and has a history of falls.  In the ED care was discussed between EDP and patient's family.  Currently follows with palliative care and has home health PT but no home health RN.  Initially family declined looking for a skilled nursing facility but ultimately agreed that that would be the best thing for the patient.  This process has been ongoing.  Today, it was noted by daughter that patient had significantly worsening mitten confusion compared to yesterday and worsening cough.  Also reportedly had some hallucinations.  Patient current state he feels okay, without complaint.  ED Course: Vital signs in the ED stable.  Lab workup included 2 BMPs with sodium 132-134, glucose 148-170, calcium 8.7-8.8.  CBC with macrocytic anemia stable at 10.5, platelets 128.  Urinalysis with hemoglobin only.  Blood cultures pending.  Chest x-ray yesterday with evidence of bronchopneumonia and small effusion, repeat x-ray today showed trace bilateral effusion no focal consolidation and improved from yesterday.  CT chest yesterday did confirm groundglass opacities suggestive of pneumonia with small effusion.  There was some cystic structures noted in the upper abdomen and recommended CT abdomen for further evaluation if  indicated, also noted was T12 compression formerly.  CT head and CT C-spine at that time showed no acute abnormality.  Patient was initially restarted on home medications while looking for SNF placement.  Has been accepted to multiple facilities however with his worsening cough, worsening confusion, and concern for possible pneumonia admission has been requested.  Patient is also and ordered ceftriaxone and doxycycline.  Review of Systems: As per HPI otherwise all other systems reviewed and are negative.  Past Medical History:  Diagnosis Date   Acute exacerbation of CHF (congestive heart failure) (HCC) 09/29/2011   Atherosclerotic heart disease of native coronary artery without angina pectoris 02/12/2010   Qualifier: Diagnosis of  By: Alesia Richards     Benign prostatic hyperplasia with lower urinary tract symptoms 12/04/2019   CAD (coronary artery disease)    Hx of    Cellulitis 03/19/2012   Chronic obstructive pulmonary disease (HCC) 12/04/2019   Diverticulosis of colon (without mention of hemorrhage) 2011   Colonoscopy    Hiatal hernia 2011   EGD   Hypertension    Hypertensive heart disease without heart failure 12/04/2019   ITP (idiopathic thrombocytopenic purpura)    He has been diagnosed with   Pancytopenia    Thrombocytopenia, unspecified (HCC) 01/24/2013   TRANSAMINASES, SERUM, ELEVATED 02/12/2010   Qualifier: Diagnosis of  By: Alesia Richards     Type 2 diabetes mellitus with diabetic peripheral angiopathy without gangrene (HCC) 12/04/2019    Past Surgical History:  Procedure Laterality Date   CARDIAC CATHETERIZATION     His last heart catheterization in May of 2009 reveals a patent LIMA   CORONARY  ANGIOPLASTY     Successful percutaneous transluminal coronary angioplasty of the left circumflex obuse marginal vessel   CORONARY ANGIOPLASTY WITH STENT PLACEMENT     CORONARY ARTERY BYPASS GRAFT     x4 with a left internal mammary artery anastomosis to the left  anterior descending coronary artery    CORONARY STENT PLACEMENT     successful percutaneous transluminal coronary angioplasty and stenting of the left main coronary artery   HERNIA REPAIR     RIGHT/LEFT HEART CATH AND CORONARY/GRAFT ANGIOGRAPHY N/A 01/26/2022   Procedure: RIGHT/LEFT HEART CATH AND CORONARY/GRAFT ANGIOGRAPHY;  Surgeon: Lennette Bihari, MD;  Location: [facility];  Service: Cardiovascular;  Laterality: N/A;   SAPHENOUS VEIN GRAFT RESECTION     graft to the first obtuse marginal, a saphenous vein graft to the first diagonal coronary artery  and a saphenous vein graft to the distal right coronary artery   TEE WITHOUT CARDIOVERSION N/A 01/28/2022   Procedure: TRANSESOPHAGEAL ECHOCARDIOGRAM (TEE);  Surgeon: Thomasene Ripple, DO;  Location: [facility] ENDOSCOPY;  Service: Cardiovascular;  Laterality: N/A;   US ECHOCARDIOGRAPHY  02-08-2007   Est. EF 40-45%    Social History  reports that he has never smoked. He has never used smokeless tobacco. He reports that he does not drink alcohol and does not use drugs.  Allergies  Allergen Reactions   Lipitor [Atorvastatin Calcium] Other (See Comments)    Causes memory loss   Crestor [Rosuvastatin Calcium] Other (See Comments)    Causes Muscle Pain   Zocor [Simvastatin] Other (See Comments)    Causes muscle pain    Family History  Problem Relation Age of Onset   Cerebrovascular Accident Mother    Colon cancer Neg Hx   Reviewed on admission  Prior to Admission medications   Medication Sig Start Date End Date Taking? Authorizing Provider  cholecalciferol (VITAMIN D3) 25 MCG (1000 UT) tablet Take 5,000 Units by mouth daily.   Yes [provider]  clopidogrel (PLAVIX) 75 MG tablet Take 1 tablet (75 mg total) by mouth daily. 01/30/22  Yes Marrianne Mood, MD  digoxin (LANOXIN) 0.125 MG tablet Take 1 tablet (0.125 mg total) by mouth daily. 05/29/22  Yes Laurey Morale, MD  ezetimibe (ZETIA) 10 MG tablet Take 1 tablet by mouth once  daily 12/11/22  Yes Nahser, Deloris Ping, MD  furosemide (LASIX) 20 MG tablet Take 1 tablet (20 mg total) by mouth every Monday, Wednesday, and Friday. 08/24/22  Yes Milford, Anderson Malta, FNP  megestrol (MEGACE) 40 MG tablet Take 1 tablet (40 mg total) by mouth daily. 11/24/22  Yes Cox, Fritzi Mandes, MD  metFORMIN (GLUCOPHAGE) 500 MG tablet Take 1 tablet by mouth 4 times daily 12/13/22  Yes Cox, Kirsten, MD  metoprolol succinate (TOPROL-XL) 25 MG 24 hr tablet Take 0.5 tablets (12.5 mg total) by mouth at bedtime. 08/21/22  Yes Milford, Anderson Malta, FNP  Multiple Vitamin (MULTIVITAMIN) tablet Take 1 tablet by mouth daily.   Yes [provider]  nitroGLYCERIN (NITROSTAT) 0.4 MG SL tablet Place 1 tablet (0.4 mg total) under the tongue every 5 (five) minutes as needed for chest pain. 02/02/22 05/30/23 Yes Nahser, Deloris Ping, MD  potassium chloride SA (KLOR-CON M) 20 MEQ tablet Take 1 tablet (20 mEq total) by mouth every Monday, Wednesday, and Friday. 08/24/22  Yes Milford, Jessica M, FNP  Vericiguat (VERQUVO) 2.5 MG TABS Take 2.5 mg by mouth daily. 05/29/22  Yes Laurey Morale, MD  glucose blood (ONETOUCH ULTRA) test strip AS DIRECTED  01/07/23   Cox, Fritzi Mandes, MD  Insulin Pen Needle (BD PEN NEEDLE NANO U/F) 32G X 4 MM MISC 1 each by Does not apply route daily. 03/03/22   [name], MD  Lancets MISC 1 each by Does not apply route daily as needed. Please provide lancets that fit patients lancet device 10/09/19   [name], MD    Physical Exam: Vitals:   01/18/23 1445 01/18/23 1630 01/18/23 2053 01/19/23 0944  BP:  122/81 121/71 116/64  Pulse: 84 91 80 87  Resp: 20 18 15 18   Temp:   98.2 F (36.8 C) 98.4 F (36.9 C)  TempSrc:   Oral Axillary  SpO2: 98% 91% 100% 99%    Physical Exam Constitutional:      General: He is not in acute distress.    Appearance: Normal appearance.  HENT:     Head: Normocephalic and atraumatic.     Mouth/Throat:     Mouth: Mucous membranes are moist.      Pharynx: Oropharynx is clear.  Eyes:     Extraocular Movements: Extraocular movements intact.     Pupils: Pupils are equal, round, and reactive to light.  Cardiovascular:     Rate and Rhythm: Normal rate and regular rhythm.     Pulses: Normal pulses.     Heart sounds: Normal heart sounds.  Pulmonary:     Effort: Pulmonary effort is normal. No respiratory distress.     Breath sounds: Normal breath sounds.  Abdominal:     General: Bowel sounds are normal. There is no distension.     Palpations: Abdomen is soft.     Tenderness: There is no abdominal tenderness.  Musculoskeletal:        General: No swelling or deformity.  Skin:    General: Skin is warm and dry.  Neurological:     General: No focal deficit present.     Comments: Still not back at baseline per family, but alert and appropriate when seen    Labs on Admission: I have personally reviewed following labs and imaging studies  CBC: Recent Labs  Lab 01/18/23 0643 01/19/23 1608  WBC 6.6 6.2  NEUTROABS 4.8 4.1  HGB 10.5* 10.4*  HCT 30.7* 30.7*  MCV 100.7* 97.2  PLT 128* 124*    Basic Metabolic Panel: Recent Labs  Lab 01/18/23 0643 01/19/23 1608  NA 132* 134*  K 3.8 3.8  CL 102 104  CO2 22 22  GLUCOSE 170* 148*  BUN 11 11  CREATININE 0.88 1.05  CALCIUM 8.8* 8.7*    GFR: CrCl cannot be calculated (Unknown ideal weight.).  Liver Function Tests: No results for input(s): "AST", "ALT", "ALKPHOS", "BILITOT", "PROT", "ALBUMIN" in the last 168 hours.  Urine analysis:    Component Value Date/Time   COLORURINE YELLOW 01/19/2023 1248   APPEARANCEUR CLEAR 01/19/2023 1248   LABSPEC 1.008 01/19/2023 1248   PHURINE 7.0 01/19/2023 1248   GLUCOSEU NEGATIVE 01/19/2023 1248   GLUCOSEU 250 (?) 02/12/2010 1055   HGBUR SMALL (A) 01/19/2023 1248   BILIRUBINUR NEGATIVE 01/19/2023 1248   KETONESUR NEGATIVE 01/19/2023 1248   PROTEINUR NEGATIVE 01/19/2023 1248   UROBILINOGEN 1.0 02/12/2010 1055   NITRITE NEGATIVE  01/19/2023 1248   LEUKOCYTESUR NEGATIVE 01/19/2023 1248    Radiological Exams on Admission: DG Chest 2 View  Result Date: 01/19/2023 CLINICAL DATA:  Cough.  Concern for pneumonia. EXAM: CHEST - 2 VIEW COMPARISON:  Chest radiograph dated 01/18/2023. FINDINGS: Trace bilateral pleural effusions. No focal consolidation, or  pneumothorax. There is diffuse chronic interstitial coarsening and bronchitic changes. Overall improved aeration of the lungs and interstitial and peribronchial cuffing since the prior radiograph. Stable cardiac silhouette. Median sternotomy wires. No acute osseous pathology. IMPRESSION: 1. Trace bilateral pleural effusions. No focal consolidation. 2. Improved aeration of the lungs and interstitial prominence since the prior radiograph. Electronically Signed   By: Elgie Collard M.D.   On: 01/19/2023 16:42   CT Chest W Contrast  Result Date: 01/18/2023 CLINICAL DATA:  Pneumonia suspected EXAM: CT CHEST WITH CONTRAST TECHNIQUE: Multidetector CT imaging of the chest was performed during intravenous contrast administration. RADIATION DOSE REDUCTION: This exam was performed according to the departmental dose-optimization program which includes automated exposure control, adjustment of the mA and/or kV according to patient size and/or use of iterative reconstruction technique. CONTRAST:  75mL OMNIPAQUE IOHEXOL 350 MG/ML SOLN COMPARISON:  Chest CT dated December 27, 2009 FINDINGS: Cardiovascular: Cardiac contours are limits of normal in size. No pericardial effusion. Severe coronary artery calcifications status post CABG. Normal caliber thoracic aorta with moderate atherosclerotic disease. Limited evaluation of the pulmonary arteries due to contrast timing. Mediastinum/Nodes: Small hiatal hernia. Thyroid is unremarkable. Mildly enlarged mediastinal lymph nodes, likely reactive. Reference subcarinal lymph node measuring 11 mm in short axis on series 4, image 76. Lungs/Pleura: Central airways are  patent. Mild diffuse ground-glass opacities and interlobular septal thickening. Bibasilar atelectasis. Small bilateral pleural effusions. Upper Abdomen: Partially visualized cystic appearing lesions of the upper abdomen, possibly arising from the pancreas, new when compared with the remote prior. Reference lesion measuring 1.8 x 2.1 cm on series 3, image 153. Musculoskeletal: Age indeterminate severe compression deformity of T12, approximally 3 mm of retropulsion. IMPRESSION: 1. Mild diffuse ground-glass opacities and interlobular septal thickening, likely pulmonary edema. 2. Small bilateral pleural effusions. 3. Partially visualized cystic appearing lesions of the upper abdomen, possibly arising from the pancreas. Recommend further evaluation with abdomen and pelvis CT. 4. Age indeterminate severe compression deformity of T12 with slight retropulsion. Correlate for point tenderness. 5. Aortic Atherosclerosis (ICD10-I70.0). Electronically Signed   By: Allegra Lai M.D.   On: 01/18/2023 11:31   CT Head Wo Contrast  Result Date: 01/18/2023 CLINICAL DATA:  Blunt poly trauma. EXAM: CT HEAD WITHOUT CONTRAST CT CERVICAL SPINE WITHOUT CONTRAST TECHNIQUE: Multidetector CT imaging of the head and cervical spine was performed following the standard protocol without intravenous contrast. Multiplanar CT image reconstructions of the cervical spine were also generated. RADIATION DOSE REDUCTION: This exam was performed according to the departmental dose-optimization program which includes automated exposure control, adjustment of the mA and/or kV according to patient size and/or use of iterative reconstruction technique. COMPARISON:  06/12/2022 FINDINGS: CT HEAD FINDINGS Brain: No evidence of acute infarction, hemorrhage, hydrocephalus, extra-axial collection or mass lesion/mass effect. Generalized brain atrophy. Vascular: No hyperdense vessel or unexpected calcification. Skull: Normal. Negative for fracture or focal  lesion. Sinuses/Orbits: No acute finding. CT CERVICAL SPINE FINDINGS Alignment: No traumatic malalignment.  Mild scoliosis. Skull base and vertebrae: No acute fracture. No primary bone lesion or focal pathologic process. Soft tissues and spinal canal: No prevertebral fluid or swelling. No visible canal hematoma. Disc levels:  Generalized degenerative endplate and facet spurring. Upper chest: Clear apical lungs IMPRESSION: No evidence of acute intracranial or cervical spine injury. Electronically Signed   By: Tiburcio Pea M.D.   On: 01/18/2023 07:15   CT Cervical Spine Wo Contrast  Result Date: 01/18/2023 CLINICAL DATA:  Blunt poly trauma. EXAM: CT HEAD WITHOUT CONTRAST CT CERVICAL SPINE  WITHOUT CONTRAST TECHNIQUE: Multidetector CT imaging of the head and cervical spine was performed following the standard protocol without intravenous contrast. Multiplanar CT image reconstructions of the cervical spine were also generated. RADIATION DOSE REDUCTION: This exam was performed according to the departmental dose-optimization program which includes automated exposure control, adjustment of the mA and/or kV according to patient size and/or use of iterative reconstruction technique. COMPARISON:  06/12/2022 FINDINGS: CT HEAD FINDINGS Brain: No evidence of acute infarction, hemorrhage, hydrocephalus, extra-axial collection or mass lesion/mass effect. Generalized brain atrophy. Vascular: No hyperdense vessel or unexpected calcification. Skull: Normal. Negative for fracture or focal lesion. Sinuses/Orbits: No acute finding. CT CERVICAL SPINE FINDINGS Alignment: No traumatic malalignment.  Mild scoliosis. Skull base and vertebrae: No acute fracture. No primary bone lesion or focal pathologic process. Soft tissues and spinal canal: No prevertebral fluid or swelling. No visible canal hematoma. Disc levels:  Generalized degenerative endplate and facet spurring. Upper chest: Clear apical lungs IMPRESSION: No evidence of acute  intracranial or cervical spine injury. Electronically Signed   By: Tiburcio Pea M.D.   On: 01/18/2023 07:15   DG Chest Portable 1 View  Result Date: 01/18/2023 CLINICAL DATA:  82 year old male with history of trauma from a fall. EXAM: PORTABLE CHEST 1 VIEW COMPARISON:  Chest x-ray 01/25/2022. FINDINGS: Lung volumes are normal. Diffuse interstitial prominence and widespread peribronchial cuffing with patchy ill-defined opacities scattered throughout the lungs bilaterally (left-greater-than-right). Small bilateral pleural effusions (left-greater-than-right). No pneumothorax. No evidence of pulmonary edema. Heart size is normal. The patient is rotated to the left on today's exam, resulting in distortion of the mediastinal contours and reduced diagnostic sensitivity and specificity for mediastinal pathology. Atherosclerotic calcifications are noted in the thoracic aorta. Status post median sternotomy for CABG. IMPRESSION: 1. The appearance of the chest is concerning for severe bronchitis likely with developing multilobar bilateral bronchopneumonia, as above. 2. Small bilateral pleural effusions (left-greater-than-right). 3. Aortic atherosclerosis. Electronically Signed   By: Trudie Reed M.D.   On: 01/18/2023 07:05    EKG: Independently reviewed.  Performed yesterday.  Showed sinus rhythm at 91 bpm.  Intraventricular conduction delay most consistent with left bundle branch block, QRS 138.  Nonspecific T wave flattening.  Similar to previous, but QRS wider.  Assessment/Plan Principal Problem:   Acute encephalopathy Active Problems:   Coronary artery disease of native artery of native heart with stable angina pectoris (HCC)   Hypertensive heart disease with CHF (HCC)   Mixed hyperlipidemia   Benign prostatic hyperplasia with lower urinary tract symptoms   Type 2 diabetes mellitus with diabetic peripheral angiopathy without gangrene (HCC)   Chronic obstructive pulmonary disease (HCC)   Diabetic  polyneuropathy (HCC)   Generalized weakness   Falls frequently   Acute encephalopathy Pneumonia > Patient with worsening confusion and some hallucinations. > In the setting of worsening cough and chest x-ray and CT scan showing evidence of pneumonia. > No leukocytosis thus far, may point towards viral infection versus elderly patient slower to mount immune response with early pneumonia > Patient started on ceftriaxone and doxycycline in the ED > Had at times been combative and so is started on one-to-one safety sitter - Monitor on telemetry overnight - Continue with one-to-one safety sitter - Hold off on further antibiotics as he will be covered through tomorrow - Follow-up respiratory viral panel, COVID screen ordered in the ED to evaluate for possible viral etiology - Trend fever curve and WBC - Procalcitonin to further differentiate between viral and bacterial etiology  Fall Weakness >  History of this with recurrent falls in the past.  Additional fall yesterday as above. > Initially was working to get placed from the ED but with developing encephalopathy and pneumonia will be admitted first.  Family has already reviewed multiple options and have a SNF in mind. - Appreciate assistance of TOC - Continue with PT as ordered in ED  Diabetes - Continue home metformin  Hypertension - Continue home Lasix, metoprolol  CHF > Last echo was in 2023 with EF 25-30%, normal diastolic function, normal RV function. - Continue home Lasix, metoprolol, digoxin - Continue home Viriciguat  - I's and O's, daily weights - Continue potassium supplementation - Trend renal function and electrolytes   CAD Hyperlipidemia - Continue home Zetia, Plavix - Continue home metoprolol  COPD > Listed in chart but not currently on any inhalers  BPH - Not on any medications for this  DVT prophylaxis: Lovenox Code Status:   Full Family Communication:  Updated at beside  Disposition Plan:   Patient is  from:  Home  Anticipated DC to:  SNF  Anticipated DC date:  1 to 3 days  Anticipated DC barriers: Placement  Consults called:  None Admission status:  Observation, telemetry  Severity of Illness: The appropriate patient status for this patient is OBSERVATION. Observation status is judged to be reasonable and necessary in order to provide the required intensity of service to ensure the patient's safety. The patient's presenting symptoms, physical exam findings, and initial radiographic and laboratory data in the context of their medical condition is felt to place them at decreased risk for further clinical deterioration. Furthermore, it is anticipated that the patient will be medically stable for discharge from the hospital within 2 midnights of admission.    Synetta Fail MD [facility]  How to contact the [facility] Attending or Consulting provider 7A - 7P or covering provider during after hours 7P -7A, for this patient?   Check the care team in [facility] and look for a) attending/consulting [facility] provider listed and b) the [facility] team listed Log into [email or web address] and use [facility]'s universal password to access. If you do not have the password, please contact the hospital operator. Locate the [facility] provider you are looking for under [facility] and page to a number that you can be directly reached. If you still have difficulty reaching the provider, please page the [facility] (Director on Call) for the Hospitalists listed on amion for assistance.  01/19/2023, 6:08 PM

## 2023-01-19 NOTE — ED Notes (Signed)
Lab tubes collected and sent to lab: 1 lav, 2 gold, 1 lt green, 1 dk green, 1 blue

## 2023-01-19 NOTE — ED Notes (Signed)
MD at bedside after relaying pt's daughter's concerns about pt hallucinating.

## 2023-01-19 NOTE — Progress Notes (Signed)
Patient on unit and stable.

## 2023-01-19 NOTE — Progress Notes (Addendum)
Presented [facility] at [facility] offer to pt's daughter, Misty Stanley. Misty Stanley accepted offer. Notified Tracey at [employer]. Will initiate Auth.   Addend @ 10:53 AM This CSW requested NPI for [facility] at [facility] and [facility] reports she does not have the NPI  at reach and is currently in a meeting. She stated she will go ahead and initiate auth.   Addend - late entry Informed RN's/EDP on 01/19/23 via secure chat that [facility], admissions rep at [facility] stated pt must be 24 hours sitter free before admitting to SNF. Notified current CSW via secure chat on 7/10.

## 2023-01-19 NOTE — Progress Notes (Signed)
This CSW has outreached to [employer] at [employer] and Photographer at [employer] regarding pt's referral. Awaiting response back.

## 2023-01-19 NOTE — ED Notes (Signed)
ED TO INPATIENT HANDOFF REPORT  ED Nurse Name and Phone #: Brayton Caves [phone]  S Name/Age/Gender [name] 82 y.o. male Room/Bed: 044C/044C  Code Status   Code Status: Full Code  Home/SNF/Other Skilled nursing facility Patient oriented to: self and place Is this baseline? No   Triage Complete: Triage complete  Chief Complaint Acute encephalopathy [G93.40]  Triage Note Lost balance getting into a lift chair, fell and hit the left side of his head/face No c-spine tender, no pain complaints, no C-collar on Pt is on plavix Small avulasion on left side of cheek/face   Allergies Allergies  Allergen Reactions   Lipitor [Atorvastatin Calcium] Other (See Comments)    Causes memory loss   Crestor [Rosuvastatin Calcium] Other (See Comments)    Causes Muscle Pain   Zocor [Simvastatin] Other (See Comments)    Causes muscle pain    Level of Care/Admitting Diagnosis ED Disposition     ED Disposition  Admit   Condition  --   Comment  Hospital Area: [facility] [[id number]]  Level of Care: Telemetry Medical [[id number]]  May place patient in observation at [facility] or [facility] if equivalent level of care is available:: No  Covid Evaluation: Asymptomatic - no recent exposure (last 10 days) testing not required  Diagnosis: Acute encephalopathy [147829]  Admitting Physician: Synetta Fail [[id number]]  Attending Physician: Synetta Fail [[id number]]          B Medical/Surgery History Past Medical History:  Diagnosis Date   Acute exacerbation of CHF (congestive heart failure) (HCC) 09/29/2011   Atherosclerotic heart disease of native coronary artery without angina pectoris 02/12/2010   Qualifier: Diagnosis of  By: Alesia Richards     Benign prostatic hyperplasia with lower urinary tract symptoms 12/04/2019   CAD (coronary artery disease)    Hx of    Cellulitis 03/19/2012   Chronic obstructive pulmonary disease (HCC) 12/04/2019    Diverticulosis of colon (without mention of hemorrhage) 2011   Colonoscopy    Hiatal hernia 2011   EGD   Hypertension    Hypertensive heart disease without heart failure 12/04/2019   ITP (idiopathic thrombocytopenic purpura)    He has been diagnosed with   Pancytopenia    Thrombocytopenia, unspecified (HCC) 01/24/2013   TRANSAMINASES, SERUM, ELEVATED 02/12/2010   Qualifier: Diagnosis of  By: Alesia Richards     Type 2 diabetes mellitus with diabetic peripheral angiopathy without gangrene (HCC) 12/04/2019   Past Surgical History:  Procedure Laterality Date   CARDIAC CATHETERIZATION     His last heart catheterization in May of 2009 reveals a patent LIMA   CORONARY ANGIOPLASTY     Successful percutaneous transluminal coronary angioplasty of the left circumflex obuse marginal vessel   CORONARY ANGIOPLASTY WITH STENT PLACEMENT     CORONARY ARTERY BYPASS GRAFT     x4 with a left internal mammary artery anastomosis to the left anterior descending coronary artery    CORONARY STENT PLACEMENT     successful percutaneous transluminal coronary angioplasty and stenting of the left main coronary artery   HERNIA REPAIR     RIGHT/LEFT HEART CATH AND CORONARY/GRAFT ANGIOGRAPHY N/A 01/26/2022   Procedure: RIGHT/LEFT HEART CATH AND CORONARY/GRAFT ANGIOGRAPHY;  Surgeon: Lennette Bihari, MD;  Location: [facility] INVASIVE CV LAB;  Service: Cardiovascular;  Laterality: N/A;   SAPHENOUS VEIN GRAFT RESECTION     graft to the first obtuse marginal, a saphenous vein graft to the first diagonal coronary artery  and a  saphenous vein graft to the distal right coronary artery   TEE WITHOUT CARDIOVERSION N/A 01/28/2022   Procedure: TRANSESOPHAGEAL ECHOCARDIOGRAM (TEE);  Surgeon: Thomasene Ripple, DO;  Location: [facility] ENDOSCOPY;  Service: Cardiovascular;  Laterality: N/A;   US ECHOCARDIOGRAPHY  02-08-2007   Est. EF 40-45%     A IV Location/Drains/Wounds Patient Lines/Drains/Airways Status     Active Line/Drains/Airways      Name Placement date Placement time Site Days   Peripheral IV 01/19/23 18 G Anterior;Right Forearm 01/19/23  1337  Forearm  less than 1            Intake/Output Last 24 hours  Intake/Output Summary (Last 24 hours) at 01/19/2023 1916 Last data filed at 01/19/2023 1838 Gross per 24 hour  Intake 1060 ml  Output 1400 ml  Net -340 ml    Labs/Imaging Results for orders placed or performed during the hospital encounter of 01/18/23 (from the past 48 hour(s))  CBC with Differential     Status: Abnormal   Collection Time: 01/18/23  6:43 AM  Result Value Ref Range   WBC 6.6 4.0 - 10.5 K/uL   RBC 3.05 (L) 4.22 - 5.81 MIL/uL   Hemoglobin 10.5 (L) 13.0 - 17.0 g/dL   HCT 14.7 (L) 82.9 - 56.2 %   MCV 100.7 (H) 80.0 - 100.0 fL   MCH 34.4 (H) 26.0 - 34.0 pg   MCHC 34.2 30.0 - 36.0 g/dL   RDW 13.0 86.5 - 78.4 %   Platelets 128 (L) 150 - 400 K/uL   nRBC 0.0 0.0 - 0.2 %   Neutrophils Relative % 71 %   Neutro Abs 4.8 1.7 - 7.7 K/uL   Lymphocytes Relative 16 %   Lymphs Abs 1.1 0.7 - 4.0 K/uL   Monocytes Relative 8 %   Monocytes Absolute 0.5 0.1 - 1.0 K/uL   Eosinophils Relative 3 %   Eosinophils Absolute 0.2 0.0 - 0.5 K/uL   Basophils Relative 1 %   Basophils Absolute 0.0 0.0 - 0.1 K/uL   Immature Granulocytes 1 %   Abs Immature Granulocytes 0.04 0.00 - 0.07 K/uL    Comment: Performed at [facility], [address], [place], [place] [address]  Basic metabolic panel     Status: Abnormal   Collection Time: 01/18/23  6:43 AM  Result Value Ref Range   Sodium 132 (L) 135 - 145 mmol/L   Potassium 3.8 3.5 - 5.1 mmol/L   Chloride 102 98 - 111 mmol/L   CO2 22 22 - 32 mmol/L   Glucose, Bld 170 (H) 70 - 99 mg/dL    Comment: Glucose reference range applies only to samples taken after fasting for at least 8 hours.   BUN 11 8 - 23 mg/dL   Creatinine, Ser 5.28 0.61 - 1.24 mg/dL   Calcium 8.8 (L) 8.9 - 10.3 mg/dL   GFR, Estimated >41 >32 mL/min    Comment: (NOTE) Calculated using the  CKD-EPI Creatinine Equation (2021)    Anion gap 8 5 - 15    Comment: Performed at [facility] Lab, [address], [place], [place] [address]  POC CBG, ED     Status: Abnormal   Collection Time: 01/18/23  6:55 AM  Result Value Ref Range   Glucose-Capillary 169 (H) 70 - 99 mg/dL    Comment: Glucose reference range applies only to samples taken after fasting for at least 8 hours.  Urinalysis, w/ Reflex to Culture (Infection Suspected) -Urine, Clean Catch  Status: Abnormal   Collection Time: 01/19/23 12:48 PM  Result Value Ref Range   Specimen Source URINE, CLEAN CATCH    Color, Urine YELLOW YELLOW   APPearance CLEAR CLEAR   Specific Gravity, Urine 1.008 1.005 - 1.030   pH 7.0 5.0 - 8.0   Glucose, UA NEGATIVE NEGATIVE mg/dL   Hgb urine dipstick SMALL (A) NEGATIVE   Bilirubin Urine NEGATIVE NEGATIVE   Ketones, ur NEGATIVE NEGATIVE mg/dL   Protein, ur NEGATIVE NEGATIVE mg/dL   Nitrite NEGATIVE NEGATIVE   Leukocytes,Ua NEGATIVE NEGATIVE   RBC / HPF 0-5 0 - 5 RBC/hpf   WBC, UA 0-5 0 - 5 WBC/hpf    Comment:        Reflex urine culture not performed if WBC <=10, OR if Squamous epithelial cells >5. If Squamous epithelial cells >5 suggest recollection.    Bacteria, UA NONE SEEN NONE SEEN   Squamous Epithelial / HPF 0-5 0 - 5 /HPF   Mucus PRESENT     Comment: Performed at [facility], [address], [place], [place] [address]  CBC with Differential     Status: Abnormal   Collection Time: 01/19/23  4:08 PM  Result Value Ref Range   WBC 6.2 4.0 - 10.5 K/uL   RBC 3.16 (L) 4.22 - 5.81 MIL/uL   Hemoglobin 10.4 (L) 13.0 - 17.0 g/dL   HCT 60.4 (L) 54.0 - 98.1 %   MCV 97.2 80.0 - 100.0 fL   MCH 32.9 26.0 - 34.0 pg   MCHC 33.9 30.0 - 36.0 g/dL   RDW 19.1 47.8 - 29.5 %   Platelets 124 (L) 150 - 400 K/uL   nRBC 0.0 0.0 - 0.2 %   Neutrophils Relative % 66 %   Neutro Abs 4.1 1.7 - 7.7 K/uL   Lymphocytes Relative 21 %   Lymphs Abs 1.3 0.7 - 4.0 K/uL   Monocytes Relative 11 %    Monocytes Absolute 0.7 0.1 - 1.0 K/uL   Eosinophils Relative 2 %   Eosinophils Absolute 0.1 0.0 - 0.5 K/uL   Basophils Relative 0 %   Basophils Absolute 0.0 0.0 - 0.1 K/uL   Immature Granulocytes 0 %   Abs Immature Granulocytes 0.02 0.00 - 0.07 K/uL    Comment: Performed at [facility] Lab, [address] [place], [place] [address]  Basic metabolic panel     Status: Abnormal   Collection Time: 01/19/23  4:08 PM  Result Value Ref Range   Sodium 134 (L) 135 - 145 mmol/L   Potassium 3.8 3.5 - 5.1 mmol/L   Chloride 104 98 - 111 mmol/L   CO2 22 22 - 32 mmol/L   Glucose, Bld 148 (H) 70 - 99 mg/dL    Comment: Glucose reference range applies only to samples taken after fasting for at least 8 hours.   BUN 11 8 - 23 mg/dL   Creatinine, Ser 8.65 0.61 - 1.24 mg/dL   Calcium 8.7 (L) 8.9 - 10.3 mg/dL   GFR, Estimated >78 >46 mL/min    Comment: (NOTE) Calculated using the CKD-EPI Creatinine Equation (2021)    Anion gap 8 5 - 15    Comment: Performed at [facility], [address] [place], [place] [address]   DG Chest 2 View  Result Date: 01/19/2023 CLINICAL DATA:  Cough.  Concern for pneumonia. EXAM: CHEST - 2 VIEW COMPARISON:  Chest radiograph dated 01/18/2023. FINDINGS: Trace bilateral pleural effusions. No focal consolidation, or pneumothorax. There  is diffuse chronic interstitial coarsening and bronchitic changes. Overall improved aeration of the lungs and interstitial and peribronchial cuffing since the prior radiograph. Stable cardiac silhouette. Median sternotomy wires. No acute osseous pathology. IMPRESSION: 1. Trace bilateral pleural effusions. No focal consolidation. 2. Improved aeration of the lungs and interstitial prominence since the prior radiograph. Electronically Signed   By: Elgie Collard M.D.   On: 01/19/2023 16:42   CT Chest W Contrast  Result Date: 01/18/2023 CLINICAL DATA:  Pneumonia suspected EXAM: CT CHEST WITH CONTRAST TECHNIQUE: Multidetector CT imaging of  the chest was performed during intravenous contrast administration. RADIATION DOSE REDUCTION: This exam was performed according to the departmental dose-optimization program which includes automated exposure control, adjustment of the mA and/or kV according to patient size and/or use of iterative reconstruction technique. CONTRAST:  75mL OMNIPAQUE IOHEXOL 350 MG/ML SOLN COMPARISON:  Chest CT dated December 27, 2009 FINDINGS: Cardiovascular: Cardiac contours are limits of normal in size. No pericardial effusion. Severe coronary artery calcifications status post CABG. Normal caliber thoracic aorta with moderate atherosclerotic disease. Limited evaluation of the pulmonary arteries due to contrast timing. Mediastinum/Nodes: Small hiatal hernia. Thyroid is unremarkable. Mildly enlarged mediastinal lymph nodes, likely reactive. Reference subcarinal lymph node measuring 11 mm in short axis on series 4, image 76. Lungs/Pleura: Central airways are patent. Mild diffuse ground-glass opacities and interlobular septal thickening. Bibasilar atelectasis. Small bilateral pleural effusions. Upper Abdomen: Partially visualized cystic appearing lesions of the upper abdomen, possibly arising from the pancreas, new when compared with the remote prior. Reference lesion measuring 1.8 x 2.1 cm on series 3, image 153. Musculoskeletal: Age indeterminate severe compression deformity of T12, approximally 3 mm of retropulsion. IMPRESSION: 1. Mild diffuse ground-glass opacities and interlobular septal thickening, likely pulmonary edema. 2. Small bilateral pleural effusions. 3. Partially visualized cystic appearing lesions of the upper abdomen, possibly arising from the pancreas. Recommend further evaluation with abdomen and pelvis CT. 4. Age indeterminate severe compression deformity of T12 with slight retropulsion. Correlate for point tenderness. 5. Aortic Atherosclerosis (ICD10-I70.0). Electronically Signed   By: Allegra Lai M.D.   On:  01/18/2023 11:31   CT Head Wo Contrast  Result Date: 01/18/2023 CLINICAL DATA:  Blunt poly trauma. EXAM: CT HEAD WITHOUT CONTRAST CT CERVICAL SPINE WITHOUT CONTRAST TECHNIQUE: Multidetector CT imaging of the head and cervical spine was performed following the standard protocol without intravenous contrast. Multiplanar CT image reconstructions of the cervical spine were also generated. RADIATION DOSE REDUCTION: This exam was performed according to the departmental dose-optimization program which includes automated exposure control, adjustment of the mA and/or kV according to patient size and/or use of iterative reconstruction technique. COMPARISON:  06/12/2022 FINDINGS: CT HEAD FINDINGS Brain: No evidence of acute infarction, hemorrhage, hydrocephalus, extra-axial collection or mass lesion/mass effect. Generalized brain atrophy. Vascular: No hyperdense vessel or unexpected calcification. Skull: Normal. Negative for fracture or focal lesion. Sinuses/Orbits: No acute finding. CT CERVICAL SPINE FINDINGS Alignment: No traumatic malalignment.  Mild scoliosis. Skull base and vertebrae: No acute fracture. No primary bone lesion or focal pathologic process. Soft tissues and spinal canal: No prevertebral fluid or swelling. No visible canal hematoma. Disc levels:  Generalized degenerative endplate and facet spurring. Upper chest: Clear apical lungs IMPRESSION: No evidence of acute intracranial or cervical spine injury. Electronically Signed   By: Tiburcio Pea M.D.   On: 01/18/2023 07:15   CT Cervical Spine Wo Contrast  Result Date: 01/18/2023 CLINICAL DATA:  Blunt poly trauma. EXAM: CT HEAD WITHOUT CONTRAST CT CERVICAL SPINE WITHOUT CONTRAST  TECHNIQUE: Multidetector CT imaging of the head and cervical spine was performed following the standard protocol without intravenous contrast. Multiplanar CT image reconstructions of the cervical spine were also generated. RADIATION DOSE REDUCTION: This exam was performed  according to the departmental dose-optimization program which includes automated exposure control, adjustment of the mA and/or kV according to patient size and/or use of iterative reconstruction technique. COMPARISON:  06/12/2022 FINDINGS: CT HEAD FINDINGS Brain: No evidence of acute infarction, hemorrhage, hydrocephalus, extra-axial collection or mass lesion/mass effect. Generalized brain atrophy. Vascular: No hyperdense vessel or unexpected calcification. Skull: Normal. Negative for fracture or focal lesion. Sinuses/Orbits: No acute finding. CT CERVICAL SPINE FINDINGS Alignment: No traumatic malalignment.  Mild scoliosis. Skull base and vertebrae: No acute fracture. No primary bone lesion or focal pathologic process. Soft tissues and spinal canal: No prevertebral fluid or swelling. No visible canal hematoma. Disc levels:  Generalized degenerative endplate and facet spurring. Upper chest: Clear apical lungs IMPRESSION: No evidence of acute intracranial or cervical spine injury. Electronically Signed   By: Tiburcio Pea M.D.   On: 01/18/2023 07:15   DG Chest Portable 1 View  Result Date: 01/18/2023 CLINICAL DATA:  82 year old male with history of trauma from a fall. EXAM: PORTABLE CHEST 1 VIEW COMPARISON:  Chest x-ray 01/25/2022. FINDINGS: Lung volumes are normal. Diffuse interstitial prominence and widespread peribronchial cuffing with patchy ill-defined opacities scattered throughout the lungs bilaterally (left-greater-than-right). Small bilateral pleural effusions (left-greater-than-right). No pneumothorax. No evidence of pulmonary edema. Heart size is normal. The patient is rotated to the left on today's exam, resulting in distortion of the mediastinal contours and reduced diagnostic sensitivity and specificity for mediastinal pathology. Atherosclerotic calcifications are noted in the thoracic aorta. Status post median sternotomy for CABG. IMPRESSION: 1. The appearance of the chest is concerning for severe  bronchitis likely with developing multilobar bilateral bronchopneumonia, as above. 2. Small bilateral pleural effusions (left-greater-than-right). 3. Aortic atherosclerosis. Electronically Signed   By: Trudie Reed M.D.   On: 01/18/2023 07:05    Pending Labs Unresulted Labs (From admission, onward)     Start     Ordered   01/26/23 0500  Creatinine, serum  (enoxaparin (LOVENOX)    CrCl >/= 30 ml/min)  Weekly,   R     Comments: while on enoxaparin therapy    01/19/23 1802   01/20/23 0500  Comprehensive metabolic panel  Tomorrow morning,   R        01/19/23 1802   01/20/23 0500  CBC  Tomorrow morning,   R        01/19/23 1802   01/19/23 1803  Procalcitonin  Once,   R       References:    Procalcitonin Lower Respiratory Tract Infection AND Sepsis Procalcitonin Algorithm   01/19/23 1802   01/19/23 1745  Respiratory (~20 pathogens) panel by PCR  (Respiratory panel by PCR (~20 pathogens, ~24 hr TAT)  w precautions)  Once,   URGENT        01/19/23 1744   01/19/23 1745  SARS Coronavirus 2 by RT PCR (hospital order, performed in [facility] hospital lab) *cepheid single result test*  (SARS Coronavirus 2 by RT PCR (hospital order, performed in [facility] hospital lab) *cepheid single result test*)  Once,   URGENT        01/19/23 1744   01/19/23 1612  Blood culture (routine x 2)  BLOOD CULTURE X 2,   R (with STAT occurrences)      01/19/23 1611  Vitals/Pain Today's Vitals   01/19/23 1300 01/19/23 1830 01/19/23 1839 01/19/23 1845  BP:    98/65  Pulse:  85  79  Resp:    (!) 21  Temp:      TempSrc:      SpO2:  98%  99%  PainSc: Asleep  0-No pain     Isolation Precautions No active isolations  Medications Medications  digoxin (LANOXIN) tablet 0.125 mg (0.125 mg Oral Patient Refused/Not Given 01/18/23 1354)  ezetimibe (ZETIA) tablet 10 mg (10 mg Oral Given 01/19/23 0943)  furosemide (LASIX) tablet 20 mg (20 mg Oral Patient Refused/Not Given 01/18/23 1355)  megestrol (MEGACE)  tablet 20 mg (20 mg Oral Given 01/19/23 0943)  metFORMIN (GLUCOPHAGE) tablet 500 mg (500 mg Oral Given 01/19/23 1323)  metoprolol succinate (TOPROL-XL) 24 hr tablet 12.5 mg (12.5 mg Oral Given 01/18/23 2059)  nitroGLYCERIN (NITROSTAT) SL tablet 0.4 mg (has no administration in time range)  potassium chloride SA (KLOR-CON M) CR tablet 20 mEq (20 mEq Oral Patient Refused/Not Given 01/18/23 1355)  multivitamin with minerals tablet 1 tablet (1 tablet Oral Given 01/19/23 0943)  cholecalciferol (VITAMIN D3) 10 MCG (400 UNIT) tablet 400 Units (400 Units Oral Given 01/19/23 0943)  clopidogrel (PLAVIX) tablet 75 mg (75 mg Oral Given 01/19/23 0943)  enoxaparin (LOVENOX) injection 40 mg (has no administration in time range)  sodium chloride flush (NS) 0.9 % injection 3 mL (has no administration in time range)  acetaminophen (TYLENOL) tablet 650 mg (has no administration in time range)    Or  acetaminophen (TYLENOL) suppository 650 mg (has no administration in time range)  polyethylene glycol (MIRALAX / GLYCOLAX) packet 17 g (has no administration in time range)  lidocaine-EPINEPHrine-tetracaine (LET) topical gel (3 mLs Topical Given 01/18/23 0740)  iohexol (OMNIPAQUE) 350 MG/ML injection 75 mL (75 mLs Intravenous Contrast Given 01/18/23 1040)  cefTRIAXone (ROCEPHIN) 1 g in sodium chloride 0.9 % 100 mL IVPB (0 g Intravenous Stopped 01/19/23 1704)  doxycycline (VIBRA-TABS) tablet 100 mg (100 mg Oral Given 01/19/23 1625)    Mobility walks with person assist     Focused Assessments Cardiac Assessment Handoff:    Lab Results  Component Value Date   CKTOTAL 105 02/12/2010   No results found for: "DDIMER" Does the Patient currently have chest pain? No    R Recommendations: See Admitting Provider Note  Report given to:   Additional Notes: .

## 2023-01-19 NOTE — ED Provider Notes (Addendum)
Emergency Medicine Observation Re-evaluation Note  [name] is a 82 y.o. male, seen on rounds today.  Pt initially presented to the ED for complaints of Fall Currently, the patient is resting.  Physical Exam  BP 121/71 (BP Location: Left Arm)   Pulse 80   Temp 98.2 F (36.8 C) (Oral)   Resp 15   SpO2 100%  Physical Exam General: NAD Cardiac: well perfused Lungs: even and unlabored   ED Course / MDM  EKG:EKG Interpretation Date/Time:  Monday January 18 2023 06:49:53 EDT Ventricular Rate:  91 PR Interval:  199 QRS Duration:  138 QT Interval:  360 QTC Calculation: 443 R Axis:   -31  Text Interpretation: Sinus rhythm IVCD, consider atypical RBBB Confirmed by Palumbo, April ([id number]) on 01/18/2023 6:52:29 AM  I have reviewed the labs performed to date as well as medications administered while in observation.  Recent changes in the last 24 hours include PT recommending SNF placement.  Plan  Current plan is for SNF placement, SW coordinating.   Update: Spoke with the patient's daughter who states that the patient has had a worsening cough.  He has been more confused today.  She feels that he is not at his baseline mental status.  He has had a worsening productive cough.  His chest x-ray from yesterday was reviewed and showed bronchitis with a developing bronchopneumonia.  The patient is not tachycardic, is afebrile however given his worsening cough and confusion, will initiate antibiotic therapy and admit the patient to the hospital.    Ernie Avena, MD 01/19/23 [phone]

## 2023-01-19 NOTE — ED Notes (Signed)
Pt's daughter reports concerns for coughing fits and sitter reported pt coughed after eating. Pt also says he coughs when he wakes up. Notified EDP.

## 2023-01-20 DIAGNOSIS — R531 Weakness: Secondary | ICD-10-CM | POA: Diagnosis not present

## 2023-01-20 DIAGNOSIS — J206 Acute bronchitis due to rhinovirus: Secondary | ICD-10-CM | POA: Insufficient documentation

## 2023-01-20 DIAGNOSIS — I25118 Atherosclerotic heart disease of native coronary artery with other forms of angina pectoris: Secondary | ICD-10-CM | POA: Diagnosis not present

## 2023-01-20 DIAGNOSIS — J440 Chronic obstructive pulmonary disease with acute lower respiratory infection: Secondary | ICD-10-CM | POA: Diagnosis not present

## 2023-01-20 DIAGNOSIS — G9340 Encephalopathy, unspecified: Secondary | ICD-10-CM | POA: Diagnosis not present

## 2023-01-20 DIAGNOSIS — Z955 Presence of coronary angioplasty implant and graft: Secondary | ICD-10-CM | POA: Diagnosis not present

## 2023-01-20 DIAGNOSIS — E1151 Type 2 diabetes mellitus with diabetic peripheral angiopathy without gangrene: Secondary | ICD-10-CM | POA: Diagnosis not present

## 2023-01-20 DIAGNOSIS — E1142 Type 2 diabetes mellitus with diabetic polyneuropathy: Secondary | ICD-10-CM | POA: Diagnosis not present

## 2023-01-20 DIAGNOSIS — I5042 Chronic combined systolic (congestive) and diastolic (congestive) heart failure: Secondary | ICD-10-CM | POA: Diagnosis not present

## 2023-01-20 DIAGNOSIS — S01112A Laceration without foreign body of left eyelid and periocular area, initial encounter: Secondary | ICD-10-CM | POA: Diagnosis not present

## 2023-01-20 DIAGNOSIS — Z7401 Bed confinement status: Secondary | ICD-10-CM | POA: Diagnosis not present

## 2023-01-20 DIAGNOSIS — N401 Enlarged prostate with lower urinary tract symptoms: Secondary | ICD-10-CM | POA: Diagnosis not present

## 2023-01-20 DIAGNOSIS — Z79899 Other long term (current) drug therapy: Secondary | ICD-10-CM | POA: Diagnosis not present

## 2023-01-20 DIAGNOSIS — Z602 Problems related to living alone: Secondary | ICD-10-CM | POA: Diagnosis present

## 2023-01-20 DIAGNOSIS — E782 Mixed hyperlipidemia: Secondary | ICD-10-CM | POA: Diagnosis not present

## 2023-01-20 DIAGNOSIS — G9341 Metabolic encephalopathy: Secondary | ICD-10-CM | POA: Diagnosis not present

## 2023-01-20 DIAGNOSIS — R296 Repeated falls: Secondary | ICD-10-CM | POA: Diagnosis not present

## 2023-01-20 DIAGNOSIS — B9789 Other viral agents as the cause of diseases classified elsewhere: Secondary | ICD-10-CM | POA: Diagnosis not present

## 2023-01-20 DIAGNOSIS — Y92013 Bedroom of single-family (private) house as the place of occurrence of the external cause: Secondary | ICD-10-CM | POA: Diagnosis not present

## 2023-01-20 DIAGNOSIS — I110 Hypertensive heart disease with heart failure: Secondary | ICD-10-CM | POA: Diagnosis not present

## 2023-01-20 DIAGNOSIS — J129 Viral pneumonia, unspecified: Secondary | ICD-10-CM | POA: Diagnosis not present

## 2023-01-20 DIAGNOSIS — E876 Hypokalemia: Secondary | ICD-10-CM | POA: Diagnosis not present

## 2023-01-20 DIAGNOSIS — I4510 Unspecified right bundle-branch block: Secondary | ICD-10-CM | POA: Diagnosis not present

## 2023-01-20 DIAGNOSIS — W07XXXA Fall from chair, initial encounter: Secondary | ICD-10-CM | POA: Diagnosis not present

## 2023-01-20 DIAGNOSIS — Z7902 Long term (current) use of antithrombotics/antiplatelets: Secondary | ICD-10-CM | POA: Diagnosis not present

## 2023-01-20 DIAGNOSIS — Z951 Presence of aortocoronary bypass graft: Secondary | ICD-10-CM | POA: Diagnosis not present

## 2023-01-20 DIAGNOSIS — Z823 Family history of stroke: Secondary | ICD-10-CM | POA: Diagnosis not present

## 2023-01-20 DIAGNOSIS — Z1152 Encounter for screening for COVID-19: Secondary | ICD-10-CM | POA: Diagnosis not present

## 2023-01-20 DIAGNOSIS — Z743 Need for continuous supervision: Secondary | ICD-10-CM | POA: Diagnosis not present

## 2023-01-20 DIAGNOSIS — Z794 Long term (current) use of insulin: Secondary | ICD-10-CM | POA: Diagnosis not present

## 2023-01-20 LAB — COMPREHENSIVE METABOLIC PANEL
ALT: 15 U/L (ref 0–44)
AST: 19 U/L (ref 15–41)
Albumin: 2.6 g/dL — ABNORMAL LOW (ref 3.5–5.0)
Alkaline Phosphatase: 34 U/L — ABNORMAL LOW (ref 38–126)
Anion gap: 8 (ref 5–15)
BUN: 13 mg/dL (ref 8–23)
CO2: 20 mmol/L — ABNORMAL LOW (ref 22–32)
Calcium: 8.6 mg/dL — ABNORMAL LOW (ref 8.9–10.3)
Chloride: 104 mmol/L (ref 98–111)
Creatinine, Ser: 0.8 mg/dL (ref 0.61–1.24)
GFR, Estimated: >60 mL/min (ref >60)
Glucose, Bld: 118 mg/dL — ABNORMAL HIGH (ref 70–99)
Potassium: 3.4 mmol/L — ABNORMAL LOW (ref 3.5–5.1)
Sodium: 132 mmol/L — ABNORMAL LOW (ref 135–145)
Total Bilirubin: 1.1 mg/dL (ref 0.3–1.2)
Total Protein: 5.6 g/dL — ABNORMAL LOW (ref 6.5–8.1)

## 2023-01-20 LAB — CBC
HCT: 28.4 % — ABNORMAL LOW (ref 39.0–52.0)
Hemoglobin: 9.9 g/dL — ABNORMAL LOW (ref 13.0–17.0)
MCH: 33.4 pg (ref 26.0–34.0)
MCHC: 34.9 g/dL (ref 30.0–36.0)
MCV: 95.9 fL (ref 80.0–100.0)
Platelets: 117 10*3/uL — ABNORMAL LOW (ref 150–400)
RBC: 2.96 MIL/uL — ABNORMAL LOW (ref 4.22–5.81)
RDW: 12.8 % (ref 11.5–15.5)
WBC: 6 10*3/uL (ref 4.0–10.5)
nRBC: 0 % (ref 0.0–0.2)

## 2023-01-20 MED ORDER — POTASSIUM CHLORIDE CRYS ER 20 MEQ PO TBCR
40.0000 meq | EXTENDED_RELEASE_TABLET | Freq: Once | ORAL | Status: AC
  Administered 2023-01-20: 40 meq via ORAL
  Filled 2023-01-20: qty 2

## 2023-01-20 NOTE — TOC CAGE-AID Note (Signed)
Transition of Care [facility]) - CAGE-AID Screening   Patient Details  Name: [name] MRN: [id number] Date of Birth: [birth year]  Transition of Care [facility]) CM/SW Contact:    Leota Sauers, RN Phone Number: 01/20/2023, 6:28 AM   Clinical Narrative:  Patient denies use of alcohol and illicit drugs. Education not offered at this time.  CAGE-AID Screening:    Have You Ever Felt You Ought to Cut Down on Your Drinking or Drug Use?: No Have People Annoyed You By Critizing Your Drinking Or Drug Use?: No Have You Felt Bad Or Guilty About Your Drinking Or Drug Use?: No Have You Ever Had a Drink or Used Drugs First Thing In The Morning to Steady Your Nerves or to Get Rid of a Hangover?: No CAGE-AID Score: 0  Substance Abuse Education Offered: No

## 2023-01-20 NOTE — TOC Progression Note (Signed)
Transition of Care [facility]) - Progression Note    Patient Details  Name: MORDECHAI MATUSZAK MRN: [id number] Date of Birth: 1941/03/05  [facility]) CM/SW Contact  Haim Hansson A [place], [place] Phone Number: 01/20/2023, 2:57 PM  Clinical Narrative:     CSW met with pt's daughter, Misty Stanley regarding update on pt's SNF process. CSW informed her that pt's authorization, which had been started by facility, was still pending and that pt would be discharged once that was approved. CSW also informed her that CSW will schedule ambulance transportation at discharge.  CSW said that pt could not have sitter for 24 hours and could delay discharge as well.   CSW provided contact information to reach back out if needed with any questions. No other needs discussed. CSW to provide update when informed.   Barriers to Discharge: Insurance authorization, No sitter for 24 hrs.   TOC will continue to follow.   Expected Discharge Plan: Skilled Nursing Facility Barriers to Discharge: Continued Medical Work up,   Expected Discharge Plan and Services       Living arrangements for the past 2 months: Single Family Home                                       Social Determinants of Health (SDOH) Interventions SDOH Screenings   Food Insecurity: No Food Insecurity (06/15/2022)  Housing: Low Risk  (01/26/2022)  Transportation Needs: No Transportation Needs (06/15/2022)  Utilities: Not At Risk (08/19/2022)  Alcohol Screen: Low Risk  (01/26/2022)  Depression (PHQ2-9): Low Risk  (09/11/2022)  Financial Resource Strain: Low Risk  (01/26/2022)  Physical Activity: Sufficiently Active (08/19/2022)  Social Connections: Moderately Isolated (08/19/2022)  Stress: No Stress Concern Present (08/19/2022)  Tobacco Use: Low Risk  (01/18/2023)    Readmission Risk Interventions     No data to display

## 2023-01-20 NOTE — Evaluation (Signed)
Clinical/Bedside Swallow Evaluation Patient Details  Name: [name] MRN: [id number] Date of Birth: [birth year]  Today's Date: 01/20/2023 Time: SLP Start Time (ACUTE ONLY): 0827 SLP Stop Time (ACUTE ONLY): 0836 SLP Time Calculation (min) (ACUTE ONLY): 9 min  Past Medical History:  Past Medical History:  Diagnosis Date   Acute exacerbation of CHF (congestive heart failure) (HCC) 09/29/2011   Atherosclerotic heart disease of native coronary artery without angina pectoris 02/12/2010   Qualifier: Diagnosis of  By: Alesia Richards     Benign prostatic hyperplasia with lower urinary tract symptoms 12/04/2019   CAD (coronary artery disease)    Hx of    Cellulitis 03/19/2012   Chronic obstructive pulmonary disease (HCC) 12/04/2019   Diverticulosis of colon (without mention of hemorrhage) 2011   Colonoscopy    Hiatal hernia 2011   EGD   Hypertension    Hypertensive heart disease without heart failure 12/04/2019   ITP (idiopathic thrombocytopenic purpura)    He has been diagnosed with   Pancytopenia    Thrombocytopenia, unspecified (HCC) 01/24/2013   TRANSAMINASES, SERUM, ELEVATED 02/12/2010   Qualifier: Diagnosis of  By: Alesia Richards     Type 2 diabetes mellitus with diabetic peripheral angiopathy without gangrene (HCC) 12/04/2019   Past Surgical History:  Past Surgical History:  Procedure Laterality Date   CARDIAC CATHETERIZATION     His last heart catheterization in May of 2009 reveals a patent LIMA   CORONARY ANGIOPLASTY     Successful percutaneous transluminal coronary angioplasty of the left circumflex obuse marginal vessel   CORONARY ANGIOPLASTY WITH STENT PLACEMENT     CORONARY ARTERY BYPASS GRAFT     x4 with a left internal mammary artery anastomosis to the left anterior descending coronary artery    CORONARY STENT PLACEMENT     successful percutaneous transluminal coronary angioplasty and stenting of the left main coronary artery   HERNIA REPAIR      RIGHT/LEFT HEART CATH AND CORONARY/GRAFT ANGIOGRAPHY N/A 01/26/2022   Procedure: RIGHT/LEFT HEART CATH AND CORONARY/GRAFT ANGIOGRAPHY;  Surgeon: Lennette Bihari, MD;  Location: [facility];  Service: Cardiovascular;  Laterality: N/A;   SAPHENOUS VEIN GRAFT RESECTION     graft to the first obtuse marginal, a saphenous vein graft to the first diagonal coronary artery  and a saphenous vein graft to the distal right coronary artery   TEE WITHOUT CARDIOVERSION N/A 01/28/2022   Procedure: TRANSESOPHAGEAL ECHOCARDIOGRAM (TEE);  Surgeon: Thomasene Ripple, DO;  Location: [facility] ENDOSCOPY;  Service: Cardiovascular;  Laterality: N/A;   US ECHOCARDIOGRAPHY  02-08-2007   Est. EF 40-45%   HPI:  82 yo male presents [facility] on 7/8 with fall hitting L side of face. CTH and neck negative for acute findings. PMH includes: CAD s/p CABG, COPD, HLD, and DM II. CT cervical spine 2018: Severe degenerative disc disease at C6-7    Assessment / Plan / Recommendation  Clinical Impression  Pt presents with normal oropharyngeal swallow across consistencies. Per order pt was coughing with liquids when he first woke up. He was alert, upper and lower dentures donned and adequate oromotor abilities. Swallows of thin via straw were brisk and appeared timely as was mastication of solid textures. He denies dysphagia at this time. Recommend he continue regular texture, thin liquids, pills with thin (if difficulty can place whole in puree), ensure upright posture. No further ST needed. SLP Visit Diagnosis: Dysphagia, unspecified (R13.10)    Aspiration Risk  No limitations    Diet Recommendation  Regular;Thin liquid    Liquid Administration via: Cup;Straw Medication Administration: Whole meds with liquid Supervision: Patient able to self feed Postural Changes: Seated upright at 90 degrees    Other  Recommendations Oral Care Recommendations: Oral care BID    Recommendations for follow up therapy are one component of a  multi-disciplinary discharge planning process, led by the attending physician.  Recommendations may be updated based on patient status, additional functional criteria and insurance authorization.  Follow up Recommendations No SLP follow up      Assistance Recommended at Discharge    Functional Status Assessment Patient has not had a recent decline in their functional status  Frequency and Duration            Prognosis        Swallow Study   General Date of Onset: 01/18/23 HPI: 82 yo male presents to [facility] on 7/8 with fall hitting L side of face. CTH and neck negative for acute findings. PMH includes: CAD s/p CABG, COPD, HLD, and DM II. CT cervical spine 2018: Severe degenerative disc disease at C6-7 Type of Study: Bedside Swallow Evaluation Previous Swallow Assessment:  (01/26/22 reg/thin) Diet Prior to this Study: Regular;Thin liquids (Level 0) Temperature Spikes Noted: No Respiratory Status: Room air History of Recent Intubation: No Behavior/Cognition: Alert;Cooperative;Pleasant mood Oral Cavity Assessment: Within Functional Limits Oral Care Completed by SLP: No Oral Cavity - Dentition: Dentures, top;Dentures, bottom Vision: Functional for self-feeding Self-Feeding Abilities: Able to feed self Patient Positioning: Upright in bed Baseline Vocal Quality: Normal Volitional Cough: Weak Volitional Swallow: Able to elicit    Oral/Motor/Sensory Function Overall Oral Motor/Sensory Function: Within functional limits   Ice Chips Ice chips: Not tested   Thin Liquid Thin Liquid: Within functional limits Presentation: Cup;Straw    Nectar Thick Nectar Thick Liquid: Not tested   Honey Thick Honey Thick Liquid: Not tested   Puree Puree: Within functional limits   Solid     Solid: Within functional limits      Royce Macadamia 01/20/2023,8:49 AM

## 2023-01-20 NOTE — Progress Notes (Signed)
PROGRESS NOTE    [name]  UJW:[id number] DOB: 08-Mar-1941 DOA: 01/18/2023 PCP: Blane Ohara, MD   Brief Narrative:  HPI: [name] is a 82 y.o. male with medical history significant of diabetes, neuropathy, hypertension, hyperlipidemia, CHF, weakness, falls, statin myopathy, CAD, COPD, BPH, MAT presenting after a fall and now developing encephalopathy.   Patient initially presented to the ED yesterday evening when he fell trying to get in a chair left.  He fell on the left side of his face did not have loss of consciousness.  Did have a small laceration and some bruising.  Laceration was repaired without issue.  Patient lives alone and has a history of falls.   In the ED care was discussed between EDP and patient's family.  Currently follows with palliative care and has home health PT but no home health RN.  Initially family declined looking for a skilled nursing facility but ultimately agreed that that would be the best thing for the patient.  This process has been ongoing.   Today, it was noted by daughter that patient had significantly worsening mitten confusion compared to yesterday and worsening cough.  Also reportedly had some hallucinations.   Patient current state he feels okay, without complaint.   ED Course: Vital signs in the ED stable.  Lab workup included 2 BMPs with sodium 132-134, glucose 148-170, calcium 8.7-8.8.  CBC with macrocytic anemia stable at 10.5, platelets 128.  Urinalysis with hemoglobin only.  Blood cultures pending.  Chest x-ray yesterday with evidence of bronchopneumonia and small effusion, repeat x-ray today showed trace bilateral effusion no focal consolidation and improved from yesterday.  CT chest yesterday did confirm groundglass opacities suggestive of pneumonia with small effusion.  There was some cystic structures noted in the upper abdomen and recommended CT abdomen for further evaluation if indicated, also noted was T12 compression formerly.  CT  head and CT C-spine at that time showed no acute abnormality.   Patient was initially restarted on home medications while looking for SNF placement.  Has been accepted to multiple facilities however with his worsening cough, worsening confusion, and concern for possible pneumonia admission has been requested.  Patient is also and ordered ceftriaxone and doxycycline.  Assessment & Plan:   Principal Problem:   Acute encephalopathy Active Problems:   Coronary artery disease of native artery of native heart with stable angina pectoris (HCC)   Hypertensive heart disease with CHF (HCC)   Mixed hyperlipidemia   Benign prostatic hyperplasia with lower urinary tract symptoms   Type 2 diabetes mellitus with diabetic peripheral angiopathy without gangrene (HCC)   Chronic obstructive pulmonary disease (HCC)   Diabetic polyneuropathy (HCC)   Generalized weakness   Falls frequently   Pneumonia   Acute bronchitis due to Rhinovirus  Acute metabolic encephalopathy likely secondary to acute bronchitis due to rhinovirus, POA: Although CT scan showing evidence of pneumonia however with procalcitonin being unremarkable and testing positive for rhinovirus, I highly doubt bacterial pneumonia, patient received dose of antibiotics yesterday, no more antibiotics indicated.  Patient is now fully alert and oriented.  Will treat symptomatically and supportive care for viral bronchitis.   Fall /generalized weakness > History of this with recurrent falls in the past.  Additional fall yesterday as above.  PT OT recommend SNF, pending placement.  TOC following.  Hypokalemia: Will replace.   Prediabetes: Hemoglobin A1c has been between 6.1-6.4 for last 1 year making him prediabetic but not diabetic.  He is on metformin at home  which has been continued however I am going to discontinue that.  He does not need SSI.   Hypertension -Blood pressure stable.  Continue home Lasix, metoprolol   Chronic systolic CHF > Last  echo was in 2023 with EF 25-30%, normal diastolic function, normal RV function. - Continue home Lasix, metoprolol, digoxin - Continue home Viriciguat  - I's and O's, daily weights - Continue potassium supplementation - Trend renal function and electrolytes   CAD Hyperlipidemia - Continue home Zetia, Plavix - Continue home metoprolol   COPD > Listed in chart but not currently on any inhalers   BPH - Not on any medications for this  DVT prophylaxis: enoxaparin (LOVENOX) injection 40 mg Start: 01/19/23 2000   Code Status: Full Code  Family Communication: Daughter present at bedside.  Plan of care discussed with patient and her daughter in length and he/she verbalized understanding and agreed with it.  Status is: Observation The patient will require care spanning > 2 midnights and should be moved to inpatient because: Medically stable, pending placement.   Estimated body mass index is 23.44 kg/m as calculated from the following:   Height as of this encounter: 5\' 9"  (1.753 m).   Weight as of this encounter: 72 kg.    Nutritional Assessment: Body mass index is 23.44 kg/m.Marland Kitchen Seen by dietician.  I agree with the assessment and plan as outlined below: Nutrition Status:        . Skin Assessment: I have examined the patient's skin and I agree with the wound assessment as performed by the wound care RN as outlined below:    Consultants:  None  Procedures:  None  Antimicrobials:  Anti-infectives (From admission, onward)    Start     Dose/Rate Route Frequency Ordered Stop   01/19/23 1600  cefTRIAXone (ROCEPHIN) 1 g in sodium chloride 0.9 % 100 mL IVPB        1 g 200 mL/hr over 30 Minutes Intravenous  Once 01/19/23 1550 01/19/23 1704   01/19/23 1600  doxycycline (VIBRA-TABS) tablet 100 mg        100 mg Oral  Once 01/19/23 1550 01/19/23 1625         Subjective: Patient seen and examined this morning.  Daughter at the bedside.  Patient was fully alert and oriented  with no respiratory symptoms at all.  Objective: Vitals:   01/19/23 2351 01/20/23 0407 01/20/23 0500 01/20/23 0807  BP: 112/66 116/77  116/66  Pulse: 77 73  75  Resp: 18 20  18   Temp: 97.6 F (36.4 C) 98.3 F (36.8 C)  97.8 F (36.6 C)  TempSrc: Oral Oral  Oral  SpO2: 98% 97%  100%  Weight:   72 kg   Height:        Intake/Output Summary (Last 24 hours) at 01/20/2023 1433 Last data filed at 01/20/2023 1344 Gross per 24 hour  Intake 1033 ml  Output 2000 ml  Net -967 ml   Filed Weights   01/19/23 2023 01/20/23 0500  Weight: 68.4 kg 72 kg    Examination:  General exam: Appears calm and comfortable  Respiratory system: Clear to auscultation. Respiratory effort normal. Cardiovascular system: S1 & S2 heard, RRR. No JVD, murmurs, rubs, gallops or clicks. No pedal edema. Gastrointestinal system: Abdomen is nondistended, soft and nontender. No organomegaly or masses felt. Normal bowel sounds heard. Central nervous system: Alert and oriented. No focal neurological deficits. Extremities: Symmetric 5 x 5 power. Skin: No rashes, lesions or ulcers Psychiatry: Judgement  and insight appear normal. Mood & affect appropriate.    Data Reviewed: I have personally reviewed following labs and imaging studies  CBC: Recent Labs  Lab 01/18/23 0643 01/19/23 1608 01/20/23 0318  WBC 6.6 6.2 6.0  NEUTROABS 4.8 4.1  --   HGB 10.5* 10.4* 9.9*  HCT 30.7* 30.7* 28.4*  MCV 100.7* 97.2 95.9  PLT 128* 124* 117*   Basic Metabolic Panel: Recent Labs  Lab 01/18/23 0643 01/19/23 1608 01/20/23 0318  NA 132* 134* 132*  K 3.8 3.8 3.4*  CL 102 104 104  CO2 22 22 20*  GLUCOSE 170* 148* 118*  BUN 11 11 13   CREATININE 0.88 1.05 0.80  CALCIUM 8.8* 8.7* 8.6*   GFR: Estimated Creatinine Clearance: 68.7 mL/min (by C-G formula based on SCr of 0.8 mg/dL). Liver Function Tests: Recent Labs  Lab 01/20/23 0318  AST 19  ALT 15  ALKPHOS 34*  BILITOT 1.1  PROT 5.6*  ALBUMIN 2.6*   No results  for input(s): "LIPASE", "AMYLASE" in the last 168 hours. No results for input(s): "AMMONIA" in the last 168 hours. Coagulation Profile: No results for input(s): "INR", "PROTIME" in the last 168 hours. Cardiac Enzymes: No results for input(s): "CKTOTAL", "CKMB", "CKMBINDEX", "TROPONINI" in the last 168 hours. BNP (last 3 results) No results for input(s): "PROBNP" in the last 8760 hours. HbA1C: No results for input(s): "HGBA1C" in the last 72 hours. CBG: Recent Labs  Lab 01/18/23 0655  GLUCAP 169*   Lipid Profile: No results for input(s): "CHOL", "HDL", "LDLCALC", "TRIG", "CHOLHDL", "LDLDIRECT" in the last 72 hours. Thyroid Function Tests: No results for input(s): "TSH", "T4TOTAL", "FREET4", "T3FREE", "THYROIDAB" in the last 72 hours. Anemia Panel: No results for input(s): "VITAMINB12", "FOLATE", "FERRITIN", "TIBC", "IRON", "RETICCTPCT" in the last 72 hours. Sepsis Labs: Recent Labs  Lab 01/19/23 1608  PROCALCITON <0.10    Recent Results (from the past 240 hour(s))  Blood culture (routine x 2)     Status: None (Preliminary result)   Collection Time: 01/19/23  4:12 PM   Specimen: BLOOD RIGHT ARM  Result Value Ref Range Status   Specimen Description BLOOD RIGHT ARM  Final   Special Requests   Final    BOTTLES DRAWN AEROBIC AND ANAEROBIC Blood Culture adequate volume   Culture   Final    NO GROWTH < 12 HOURS Performed at [facility], [address], [place], [place] [address]    Report Status PENDING  Incomplete  Blood culture (routine x 2)     Status: None (Preliminary result)   Collection Time: 01/19/23  6:50 PM   Specimen: BLOOD RIGHT ARM  Result Value Ref Range Status   Specimen Description BLOOD RIGHT ARM  Final   Special Requests   Final    BOTTLES DRAWN AEROBIC AND ANAEROBIC Blood Culture adequate volume   Culture   Final    NO GROWTH < 12 HOURS Performed at [facility], [address] [place], [place] [address]    Report Status PENDING   Incomplete  Respiratory (~20 pathogens) panel by PCR     Status: Abnormal   Collection Time: 01/19/23  6:50 PM   Specimen: Respiratory  Result Value Ref Range Status   Adenovirus NOT DETECTED NOT DETECTED Final   Coronavirus 229E NOT DETECTED NOT DETECTED Final    Comment: (NOTE) The Coronavirus on the Respiratory Panel, DOES NOT test for the novel  Coronavirus (2019 nCoV)    Coronavirus HKU1 NOT DETECTED NOT DETECTED Final  Coronavirus NL63 NOT DETECTED NOT DETECTED Final   Coronavirus OC43 NOT DETECTED NOT DETECTED Final   Metapneumovirus NOT DETECTED NOT DETECTED Final   Rhinovirus / Enterovirus DETECTED (A) NOT DETECTED Final   Influenza A NOT DETECTED NOT DETECTED Final   Influenza B NOT DETECTED NOT DETECTED Final   Parainfluenza Virus 1 NOT DETECTED NOT DETECTED Final   Parainfluenza Virus 2 NOT DETECTED NOT DETECTED Final   Parainfluenza Virus 3 NOT DETECTED NOT DETECTED Final   Parainfluenza Virus 4 NOT DETECTED NOT DETECTED Final   Respiratory Syncytial Virus NOT DETECTED NOT DETECTED Final   Bordetella pertussis NOT DETECTED NOT DETECTED Final   Bordetella Parapertussis NOT DETECTED NOT DETECTED Final   Chlamydophila pneumoniae NOT DETECTED NOT DETECTED Final   Mycoplasma pneumoniae NOT DETECTED NOT DETECTED Final    Comment: Performed at [facility], [address], [place], [place] [address]  SARS Coronavirus 2 by RT PCR (hospital order, performed in [facility] hospital lab) *cepheid single result test*     Status: None   Collection Time: 01/19/23  6:50 PM   Specimen: Nasal Swab  Result Value Ref Range Status   SARS Coronavirus 2 by RT PCR NEGATIVE NEGATIVE Final    Comment: Performed at [facility], [address], [place], [place] [address]     Radiology Studies: DG Chest 2 View  Result Date: 01/19/2023 CLINICAL DATA:  Cough.  Concern for pneumonia. EXAM: CHEST - 2 VIEW COMPARISON:  Chest radiograph dated 01/18/2023. FINDINGS: Trace bilateral  pleural effusions. No focal consolidation, or pneumothorax. There is diffuse chronic interstitial coarsening and bronchitic changes. Overall improved aeration of the lungs and interstitial and peribronchial cuffing since the prior radiograph. Stable cardiac silhouette. Median sternotomy wires. No acute osseous pathology. IMPRESSION: 1. Trace bilateral pleural effusions. No focal consolidation. 2. Improved aeration of the lungs and interstitial prominence since the prior radiograph. Electronically Signed   By: Elgie Collard M.D.   On: 01/19/2023 16:42    Scheduled Meds:  cholecalciferol  400 Units Oral Daily   clopidogrel  75 mg Oral Daily   digoxin  0.125 mg Oral Once   enoxaparin (LOVENOX) injection  40 mg Subcutaneous Q24H   ezetimibe  10 mg Oral Daily   furosemide  20 mg Oral Once per day on Mon Wed Fri   megestrol  20 mg Oral Daily   metFORMIN  500 mg Oral TID with meals   metoprolol succinate  12.5 mg Oral QHS   multivitamin with minerals  1 tablet Oral Daily   potassium chloride  20 mEq Oral Once per day on Mon Wed Fri   sodium chloride flush  3 mL Intravenous Q12H   Vericiguat  2.5 mg Oral Q supper   Continuous Infusions:   LOS: 0 days   Hughie Closs, MD [facility]  01/20/2023, 2:33 PM   *Please note that this is a verbal dictation therefore any spelling or grammatical errors are due to the "[employer]agon [employer] One" system interpretation.  Please page via Amion and do not message via secure chat for urgent patient care matters. Secure chat can be used for non urgent patient care matters.  How to contact the [facility] Attending or Consulting provider 7A - 7P or covering provider during after hours 7P -7A, for this patient?  Check the care team in [facility] and look for a) attending/consulting TR[facility] provider listed and b) the [facility] team listed. Page or secure chat 7A-7P. Log into [email or web address] and use  [place]'s universal password to access. If you do not have the password, please  contact the hospital operator. Locate the [facility] provider you are looking for under [facility] and page to a number that you can be directly reached. If you still have difficulty reaching the provider, please page the [facility] (Director on Call) for the Hospitalists listed on amion for assistance.

## 2023-01-21 DIAGNOSIS — Z9181 History of falling: Secondary | ICD-10-CM | POA: Diagnosis not present

## 2023-01-21 DIAGNOSIS — G9340 Encephalopathy, unspecified: Secondary | ICD-10-CM | POA: Diagnosis not present

## 2023-01-21 DIAGNOSIS — Z7902 Long term (current) use of antithrombotics/antiplatelets: Secondary | ICD-10-CM | POA: Diagnosis not present

## 2023-01-21 DIAGNOSIS — N401 Enlarged prostate with lower urinary tract symptoms: Secondary | ICD-10-CM | POA: Diagnosis not present

## 2023-01-21 DIAGNOSIS — I340 Nonrheumatic mitral (valve) insufficiency: Secondary | ICD-10-CM | POA: Diagnosis not present

## 2023-01-21 DIAGNOSIS — E1142 Type 2 diabetes mellitus with diabetic polyneuropathy: Secondary | ICD-10-CM | POA: Diagnosis not present

## 2023-01-21 DIAGNOSIS — J449 Chronic obstructive pulmonary disease, unspecified: Secondary | ICD-10-CM | POA: Diagnosis not present

## 2023-01-21 DIAGNOSIS — Z743 Need for continuous supervision: Secondary | ICD-10-CM | POA: Diagnosis not present

## 2023-01-21 DIAGNOSIS — I5042 Chronic combined systolic (congestive) and diastolic (congestive) heart failure: Secondary | ICD-10-CM | POA: Diagnosis not present

## 2023-01-21 DIAGNOSIS — R296 Repeated falls: Secondary | ICD-10-CM | POA: Diagnosis not present

## 2023-01-21 DIAGNOSIS — I5031 Acute diastolic (congestive) heart failure: Secondary | ICD-10-CM | POA: Diagnosis not present

## 2023-01-21 DIAGNOSIS — J440 Chronic obstructive pulmonary disease with acute lower respiratory infection: Secondary | ICD-10-CM | POA: Diagnosis not present

## 2023-01-21 DIAGNOSIS — Z515 Encounter for palliative care: Secondary | ICD-10-CM | POA: Diagnosis not present

## 2023-01-21 DIAGNOSIS — J206 Acute bronchitis due to rhinovirus: Secondary | ICD-10-CM | POA: Diagnosis not present

## 2023-01-21 DIAGNOSIS — Z951 Presence of aortocoronary bypass graft: Secondary | ICD-10-CM | POA: Diagnosis not present

## 2023-01-21 DIAGNOSIS — R269 Unspecified abnormalities of gait and mobility: Secondary | ICD-10-CM | POA: Diagnosis not present

## 2023-01-21 DIAGNOSIS — R531 Weakness: Secondary | ICD-10-CM | POA: Diagnosis not present

## 2023-01-21 DIAGNOSIS — E782 Mixed hyperlipidemia: Secondary | ICD-10-CM | POA: Diagnosis not present

## 2023-01-21 DIAGNOSIS — I5022 Chronic systolic (congestive) heart failure: Secondary | ICD-10-CM | POA: Diagnosis not present

## 2023-01-21 DIAGNOSIS — J189 Pneumonia, unspecified organism: Secondary | ICD-10-CM | POA: Diagnosis not present

## 2023-01-21 DIAGNOSIS — I110 Hypertensive heart disease with heart failure: Secondary | ICD-10-CM | POA: Diagnosis not present

## 2023-01-21 DIAGNOSIS — I2510 Atherosclerotic heart disease of native coronary artery without angina pectoris: Secondary | ICD-10-CM | POA: Diagnosis not present

## 2023-01-21 DIAGNOSIS — R9431 Abnormal electrocardiogram [ECG] [EKG]: Secondary | ICD-10-CM | POA: Diagnosis not present

## 2023-01-21 DIAGNOSIS — E1151 Type 2 diabetes mellitus with diabetic peripheral angiopathy without gangrene: Secondary | ICD-10-CM | POA: Diagnosis not present

## 2023-01-21 DIAGNOSIS — Z7401 Bed confinement status: Secondary | ICD-10-CM | POA: Diagnosis not present

## 2023-01-21 LAB — BASIC METABOLIC PANEL
Anion gap: 7 (ref 5–15)
BUN: 15 mg/dL (ref 8–23)
CO2: 20 mmol/L — ABNORMAL LOW (ref 22–32)
Calcium: 8.6 mg/dL — ABNORMAL LOW (ref 8.9–10.3)
Chloride: 105 mmol/L (ref 98–111)
Creatinine, Ser: 0.87 mg/dL (ref 0.61–1.24)
GFR, Estimated: >60 mL/min (ref >60)
Glucose, Bld: 132 mg/dL — ABNORMAL HIGH (ref 70–99)
Potassium: 3.8 mmol/L (ref 3.5–5.1)
Sodium: 132 mmol/L — ABNORMAL LOW (ref 135–145)

## 2023-01-21 MED ORDER — VITAMIN D 25 MCG (1000 UNIT) PO TABS
1000.0000 [IU] | ORAL_TABLET | Freq: Every day | ORAL | Status: DC
  Administered 2023-01-21: 1000 [IU] via ORAL
  Filled 2023-01-21: qty 1

## 2023-01-21 NOTE — Progress Notes (Signed)
Physical Therapy Treatment Patient Details Name: [name] MRN: [id number] DOB: 12/09/1940 Today's Date: 01/21/2023   History of Present Illness 82 yo male presents to [facility] on 7/8 with fall hitting L side of face. CTH and neck negative for acute findings. PMH includes: CAD s/p CABG, COPD, HLD, and DM II. CT cervical spine 2018: Severe degenerative disc disease at C6-7    PT Comments  Pt greeted resting in bed on arrival and eager for mobility with good progress towards acute goals. Pt able to come to sitting EOB with min A to elevate trunk with pt able to scoot out to EOB without assist. Pt continues to demonstrate posterior lean in standing initially, however pt able to correct this session and maintain static standing with min A to steady intermittently and RW for support. Pt able to progress gait with RW support and min A with cues throughout for posture and increased bil foot clearance. Pt continues to be limited by decreased activity tolerance, impaired balance/postural reactions and weakness and continues to benefit from skilled PT services to progress toward functional mobility goals.      Assistance Recommended at Discharge PRN  If plan is discharge home, recommend the following:  Can travel by private vehicle    A lot of help with walking and/or transfers;A lot of help with bathing/dressing/bathroom      Equipment Recommendations  None recommended by PT    Recommendations for Other Services       Precautions / Restrictions Precautions Precautions: Fall Restrictions Weight Bearing Restrictions: No     Mobility  Bed Mobility Overal bed mobility: Needs Assistance Bed Mobility: Supine to Sit     Supine to sit: Min assist     General bed mobility comments: assist to elevate trunk    Transfers Overall transfer level: Needs assistance Equipment used: Rolling walker (2 wheels) Transfers: Sit to/from Stand, Bed to chair/wheelchair/BSC Sit to Stand: Min assist            General transfer comment: assist for rise and steady, light posterior bias once standing with pt able to correct    Ambulation/Gait Ambulation/Gait assistance: Min assist Gait Distance (Feet): 150 Feet Assistive device: Rolling walker (2 wheels) Gait Pattern/deviations: Step-through pattern, Shuffle, Trunk flexed Gait velocity: decr     General Gait Details: slow shuffling gait with RW support noted knee flexion in stance bil , cues for trunk extension and forward gaze and increased foot clearance   Stairs             Wheelchair Mobility     Tilt Bed    Modified Rankin (Stroke Patients Only)       Balance Overall balance assessment: Needs assistance, History of Falls Sitting-balance support: No upper extremity supported, Feet supported Sitting balance-Leahy Scale: Fair     Standing balance support: Bilateral upper extremity supported, During functional activity, Reliant on assistive device for balance Standing balance-Leahy Scale: Poor                              Cognition Arousal/Alertness: Awake/alert Behavior During Therapy: WFL for tasks assessed/performed Overall Cognitive Status: Within Functional Limits for tasks assessed                                          Exercises      General Comments  General comments (skin integrity, edema, etc.): VSS on RA      Pertinent Vitals/Pain Pain Assessment Pain Assessment: Faces Faces Pain Scale: Hurts a little bit Pain Location: BLEs Pain Descriptors / Indicators: Sore Pain Intervention(s): Monitored during session, Limited activity within patient's tolerance, Repositioned    Home Living                          Prior Function            PT Goals (current goals can now be found in the care plan section) Acute Rehab PT Goals PT Goal Formulation: With patient Time For Goal Achievement: 02/01/23 Progress towards PT goals: Progressing toward  goals    Frequency    Min 2X/week      PT Plan      Co-evaluation              AM-PAC PT "6 Clicks" Mobility   Outcome Measure  Help needed turning from your back to your side while in a flat bed without using bedrails?: A Little Help needed moving from lying on your back to sitting on the side of a flat bed without using bedrails?: A Little Help needed moving to and from a bed to a chair (including a wheelchair)?: A Little Help needed standing up from a chair using your arms (e.g., wheelchair or bedside chair)?: A Little Help needed to walk in hospital room?: A Little Help needed climbing 3-5 steps with a railing? : Total 6 Click Score: 16    End of Session Equipment Utilized During Treatment: Gait belt Activity Tolerance: Patient tolerated treatment well Patient left: with call bell/phone within reach;in chair;with chair alarm set Nurse Communication: Mobility status PT Visit Diagnosis: Other abnormalities of gait and mobility (R26.89);Muscle weakness (generalized) (M62.81)     Time: 4098-1191 PT Time Calculation (min) (ACUTE ONLY): 30 min  Charges:    $Gait Training: 8-22 mins $Therapeutic Activity: 8-22 mins PT General Charges $$ ACUTE PT VISIT: 1 Visit                     Giannina Bartolome R. PTA Acute Rehabilitation Services Office: [phone]   Catalina Antigua 01/21/2023, 12:19 PM

## 2023-01-21 NOTE — Discharge Summary (Signed)
Physician Discharge Summary  [name] NWG:[id number] DOB: 04-Aug-1940 DOA: 01/18/2023  PCP: Blane Ohara, MD  Admit date: 01/18/2023 Discharge date: 01/21/2023 30 Day Unplanned Readmission Risk Score    Flowsheet Row ED to Hosp-Admission (Current) from 01/18/2023 in [facility]  30 Day Unplanned Readmission Risk Score (%) 13.64 Filed at 01/21/2023 1200       This score is the patient's risk of an unplanned readmission within 30 days of being discharged (0 -100%). The score is based on dignosis, age, lab data, medications, orders, and past utilization.   Low:  0-14.9   Medium: 15-21.9   High: 22-29.9   Extreme: 30 and above          Admitted From: Home Disposition: SNF  Recommendations for Outpatient Follow-up:  Follow up with PCP in 1-2 weeks Please obtain BMP/CBC in one week Please follow up with your PCP on the following pending results: Unresulted Labs (From admission, onward)     Start     Ordered   01/26/23 0500  Creatinine, serum  (enoxaparin (LOVENOX)    CrCl >/= 30 ml/min)  Weekly,   R     Comments: while on enoxaparin therapy    01/19/23 1802   01/20/23 0423  C Difficile Quick Screen w PCR reflex  (C Difficile quick screen w PCR reflex panel )  Once, for 24 hours,   TIMED       References:    CDiff Information Tool   01/20/23 0422              Home Health: None Equipment/Devices: None  Discharge Condition: Stable CODE STATUS: Full code Diet recommendation: Cardiac  Subjective: Seen and examined feels well.  No complaints.  No shortness of breath.  Fully alert and oriented.  Brief/Interim Summary: [name] is a 82 y.o. male with medical history significant of diabetes, neuropathy, hypertension, hyperlipidemia, CHF, weakness, falls, statin myopathy, CAD, COPD, BPH, MAT presented after a fall.    Patient initially presented to the ED and evening before, when he fell trying to get in a chair left.  He did not have loss of  consciousness.  Did have a small laceration and some bruising.  Laceration was repaired without issue.  Patient lives alone and has a history of falls.   In the ED care was discussed between EDP and patient's family.  Initially family declined looking for a skilled nursing facility but ultimately agreed that that would be the best thing for the patient.  While patient was waiting for placement, patient started having confusion in the ED.  Vital signs in the ED were stable. Chest x-ray yesterday with evidence of bronchopneumonia and small effusion, repeat x-ray today showed trace bilateral effusion no focal consolidation and improved from yesterday.  CT chest yesterday did confirm groundglass opacities suggestive of pneumonia with small effusion.  There was some cystic structures noted in the upper abdomen and recommended CT abdomen for further evaluation if indicated, also noted was T12 compression formerly.  CT head and CT C-spine at that time showed no acute abnormality.   Patient was initially restarted on home medications while looking for SNF placement.  Has been accepted to multiple facilities however with his worsening cough, worsening confusion, and concern for possible pneumonia patient was admitted and he was started on IV antibiotics as well however subsequently he was tested positive for rhinovirus which confirmed viral pneumonia so antibiotics were discontinued.  Patient's acute metabolic encephalopathy was  presumed to be secondary to viral pneumonia which improved as well and patient has remained fully alert and oriented since yesterday morning.  He was assessed by PT OT.  He is now being discharged to SNF.  He will resume all his home medications.  Discharge Diagnoses:  Principal Problem:   Acute encephalopathy Active Problems:   Coronary artery disease of native artery of native heart with stable angina pectoris (HCC)   Hypertensive heart disease with CHF (HCC)   Mixed hyperlipidemia    Benign prostatic hyperplasia with lower urinary tract symptoms   Type 2 diabetes mellitus with diabetic peripheral angiopathy without gangrene (HCC)   Chronic obstructive pulmonary disease (HCC)   Diabetic polyneuropathy (HCC)   Generalized weakness   Falls frequently   Pneumonia   Acute bronchitis due to Rhinovirus   Encephalopathy acute    Discharge Instructions  Discharge Instructions     Face-to-face encounter (required for Medicare/Medicaid patients)   Complete by: As directed    I Smitty Knudsen certify that this patient is under my care and that I, or a nurse practitioner or physician's assistant working with me, had a face-to-face encounter that meets the physician face-to-face encounter requirements with this patient on 01/18/2023. The encounter with the patient was in whole, or in part for the following medical condition(s) which is the primary reason for home health care (List medical condition): Fall, initial encounter, facial laceration, bronchitis   The encounter with the patient was in whole, or in part, for the following medical condition, which is the primary reason for home health care: Falls   I certify that, based on my findings, the following services are medically necessary home health services:  Nursing Physical therapy     Reason for Medically Necessary Home Health Services: Skilled Nursing- Change/Decline in Patient Status   My clinical findings support the need for the above services: Unsafe ambulation due to balance issues   Further, I certify that my clinical findings support that this patient is homebound due to: Unsafe ambulation due to balance issues   Home Health   Complete by: As directed    To provide the following care/treatments:  PT Home Health Aide RN        Allergies as of 01/21/2023       Reactions   Lipitor [atorvastatin Calcium] Other (See Comments)   Causes memory loss   Crestor [rosuvastatin Calcium] Other (See Comments)   Causes  Muscle Pain   Zocor [simvastatin] Other (See Comments)   Causes muscle pain        Medication List     TAKE these medications    BD Pen Needle Nano U/F 32G X 4 MM Misc Generic drug: Insulin Pen Needle 1 each by Does not apply route daily.   cholecalciferol 25 MCG (1000 UNIT) tablet Commonly known as: VITAMIN D3 Take 5,000 Units by mouth daily.   clopidogrel 75 MG tablet Commonly known as: PLAVIX Take 1 tablet (75 mg total) by mouth daily.   digoxin 0.125 MG tablet Commonly known as: LANOXIN Take 1 tablet (0.125 mg total) by mouth daily.   ezetimibe 10 MG tablet Commonly known as: ZETIA Take 1 tablet by mouth once daily   furosemide 20 MG tablet Commonly known as: LASIX Take 1 tablet (20 mg total) by mouth every Monday, Wednesday, and Friday.   Lancets Misc 1 each by Does not apply route daily as needed. Please provide lancets that fit patients lancet device   megestrol 40 MG  tablet Commonly known as: MEGACE Take 1 tablet (40 mg total) by mouth daily.   metFORMIN 500 MG tablet Commonly known as: GLUCOPHAGE Take 1 tablet by mouth 4 times daily   metoprolol succinate 25 MG 24 hr tablet Commonly known as: TOPROL-XL Take 0.5 tablets (12.5 mg total) by mouth at bedtime.   multivitamin tablet Take 1 tablet by mouth daily.   nitroGLYCERIN 0.4 MG SL tablet Commonly known as: NITROSTAT Place 1 tablet (0.4 mg total) under the tongue every 5 (five) minutes as needed for chest pain.   OneTouch Ultra test strip Generic drug: glucose blood AS DIRECTED   potassium chloride SA 20 MEQ tablet Commonly known as: KLOR-CON M Take 1 tablet (20 mEq total) by mouth every Monday, Wednesday, and Friday.   Verquvo 2.5 MG Tabs Generic drug: Vericiguat Take 2.5 mg by mouth daily.        Contact information for follow-up providers     [facility] .   Specialty: Emergency Medicine Why: If symptoms worsen Contact  information: [address] [id number] mc [address] [place] [address] [phone]        Blane Ohara, MD. Call .   Specialty: Family Medicine Contact information: [address] [place] [address] [phone]         Blane Ohara, MD Follow up in 1 week(s).   Specialty: Family Medicine Contact information: [address] [place] [address] [phone]              Contact information for after-discharge care     Destination     [facility] [place] Preferred SNF .   Service: Skilled Nursing Contact information: [address] [place] [address] [phone]                    Allergies  Allergen Reactions   Lipitor [Atorvastatin Calcium] Other (See Comments)    Causes memory loss   Crestor [Rosuvastatin Calcium] Other (See Comments)    Causes Muscle Pain   Zocor [Simvastatin] Other (See Comments)    Causes muscle pain    Consultations: None   Procedures/Studies: DG Chest 2 View  Result Date: 01/19/2023 CLINICAL DATA:  Cough.  Concern for pneumonia. EXAM: CHEST - 2 VIEW COMPARISON:  Chest radiograph dated 01/18/2023. FINDINGS: Trace bilateral pleural effusions. No focal consolidation, or pneumothorax. There is diffuse chronic interstitial coarsening and bronchitic changes. Overall improved aeration of the lungs and interstitial and peribronchial cuffing since the prior radiograph. Stable cardiac silhouette. Median sternotomy wires. No acute osseous pathology. IMPRESSION: 1. Trace bilateral pleural effusions. No focal consolidation. 2. Improved aeration of the lungs and interstitial prominence since the prior radiograph. Electronically Signed   By: Elgie Collard M.D.   On: 01/19/2023 16:42   CT Chest W Contrast  Result Date: 01/18/2023 CLINICAL DATA:  Pneumonia suspected EXAM: CT CHEST WITH CONTRAST TECHNIQUE: Multidetector CT imaging of the chest was performed  during intravenous contrast administration. RADIATION DOSE REDUCTION: This exam was performed according to the departmental dose-optimization program which includes automated exposure control, adjustment of the mA and/or kV according to patient size and/or use of iterative reconstruction technique. CONTRAST:  75mL OMNIPAQUE IOHEXOL 350 MG/ML SOLN COMPARISON:  Chest CT dated December 27, 2009 FINDINGS: Cardiovascular: Cardiac contours are limits of normal in size. No pericardial effusion. Severe coronary artery calcifications status post CABG. Normal caliber thoracic aorta with moderate atherosclerotic disease. Limited evaluation of  the pulmonary arteries due to contrast timing. Mediastinum/Nodes: Small hiatal hernia. Thyroid is unremarkable. Mildly enlarged mediastinal lymph nodes, likely reactive. Reference subcarinal lymph node measuring 11 mm in short axis on series 4, image 76. Lungs/Pleura: Central airways are patent. Mild diffuse ground-glass opacities and interlobular septal thickening. Bibasilar atelectasis. Small bilateral pleural effusions. Upper Abdomen: Partially visualized cystic appearing lesions of the upper abdomen, possibly arising from the pancreas, new when compared with the remote prior. Reference lesion measuring 1.8 x 2.1 cm on series 3, image 153. Musculoskeletal: Age indeterminate severe compression deformity of T12, approximally 3 mm of retropulsion. IMPRESSION: 1. Mild diffuse ground-glass opacities and interlobular septal thickening, likely pulmonary edema. 2. Small bilateral pleural effusions. 3. Partially visualized cystic appearing lesions of the upper abdomen, possibly arising from the pancreas. Recommend further evaluation with abdomen and pelvis CT. 4. Age indeterminate severe compression deformity of T12 with slight retropulsion. Correlate for point tenderness. 5. Aortic Atherosclerosis (ICD10-I70.0). Electronically Signed   By: Allegra Lai M.D.   On: 01/18/2023 11:31   CT Head  Wo Contrast  Result Date: 01/18/2023 CLINICAL DATA:  Blunt poly trauma. EXAM: CT HEAD WITHOUT CONTRAST CT CERVICAL SPINE WITHOUT CONTRAST TECHNIQUE: Multidetector CT imaging of the head and cervical spine was performed following the standard protocol without intravenous contrast. Multiplanar CT image reconstructions of the cervical spine were also generated. RADIATION DOSE REDUCTION: This exam was performed according to the departmental dose-optimization program which includes automated exposure control, adjustment of the mA and/or kV according to patient size and/or use of iterative reconstruction technique. COMPARISON:  06/12/2022 FINDINGS: CT HEAD FINDINGS Brain: No evidence of acute infarction, hemorrhage, hydrocephalus, extra-axial collection or mass lesion/mass effect. Generalized brain atrophy. Vascular: No hyperdense vessel or unexpected calcification. Skull: Normal. Negative for fracture or focal lesion. Sinuses/Orbits: No acute finding. CT CERVICAL SPINE FINDINGS Alignment: No traumatic malalignment.  Mild scoliosis. Skull base and vertebrae: No acute fracture. No primary bone lesion or focal pathologic process. Soft tissues and spinal canal: No prevertebral fluid or swelling. No visible canal hematoma. Disc levels:  Generalized degenerative endplate and facet spurring. Upper chest: Clear apical lungs IMPRESSION: No evidence of acute intracranial or cervical spine injury. Electronically Signed   By: Tiburcio Pea M.D.   On: 01/18/2023 07:15   CT Cervical Spine Wo Contrast  Result Date: 01/18/2023 CLINICAL DATA:  Blunt poly trauma. EXAM: CT HEAD WITHOUT CONTRAST CT CERVICAL SPINE WITHOUT CONTRAST TECHNIQUE: Multidetector CT imaging of the head and cervical spine was performed following the standard protocol without intravenous contrast. Multiplanar CT image reconstructions of the cervical spine were also generated. RADIATION DOSE REDUCTION: This exam was performed according to the departmental  dose-optimization program which includes automated exposure control, adjustment of the mA and/or kV according to patient size and/or use of iterative reconstruction technique. COMPARISON:  06/12/2022 FINDINGS: CT HEAD FINDINGS Brain: No evidence of acute infarction, hemorrhage, hydrocephalus, extra-axial collection or mass lesion/mass effect. Generalized brain atrophy. Vascular: No hyperdense vessel or unexpected calcification. Skull: Normal. Negative for fracture or focal lesion. Sinuses/Orbits: No acute finding. CT CERVICAL SPINE FINDINGS Alignment: No traumatic malalignment.  Mild scoliosis. Skull base and vertebrae: No acute fracture. No primary bone lesion or focal pathologic process. Soft tissues and spinal canal: No prevertebral fluid or swelling. No visible canal hematoma. Disc levels:  Generalized degenerative endplate and facet spurring. Upper chest: Clear apical lungs IMPRESSION: No evidence of acute intracranial or cervical spine injury. Electronically Signed   By: Tiburcio Pea M.D.   On:  01/18/2023 07:15   DG Chest Portable 1 View  Result Date: 01/18/2023 CLINICAL DATA:  82 year old male with history of trauma from a fall. EXAM: PORTABLE CHEST 1 VIEW COMPARISON:  Chest x-ray 01/25/2022. FINDINGS: Lung volumes are normal. Diffuse interstitial prominence and widespread peribronchial cuffing with patchy ill-defined opacities scattered throughout the lungs bilaterally (left-greater-than-right). Small bilateral pleural effusions (left-greater-than-right). No pneumothorax. No evidence of pulmonary edema. Heart size is normal. The patient is rotated to the left on today's exam, resulting in distortion of the mediastinal contours and reduced diagnostic sensitivity and specificity for mediastinal pathology. Atherosclerotic calcifications are noted in the thoracic aorta. Status post median sternotomy for CABG. IMPRESSION: 1. The appearance of the chest is concerning for severe bronchitis likely with  developing multilobar bilateral bronchopneumonia, as above. 2. Small bilateral pleural effusions (left-greater-than-right). 3. Aortic atherosclerosis. Electronically Signed   By: Trudie Reed M.D.   On: 01/18/2023 07:05     Discharge Exam: Vitals:   01/21/23 0515 01/21/23 0803  BP: 111/70 111/74  Pulse: 82 86  Resp: 15 18  Temp: (!) 97.5 F (36.4 C) 98 F (36.7 C)  SpO2: 98% 100%   Vitals:   01/20/23 2227 01/21/23 0500 01/21/23 0515 01/21/23 0803  BP: 117/71  111/70 111/74  Pulse: 89  82 86  Resp:   15 18  Temp:   (!) 97.5 F (36.4 C) 98 F (36.7 C)  TempSrc:      SpO2:   98% 100%  Weight:  68 kg    Height:        General: Pt is alert, awake, not in acute distress Cardiovascular: RRR, S1/S2 +, no rubs, no gallops Respiratory: CTA bilaterally, no wheezing, no rhonchi Abdominal: Soft, NT, ND, bowel sounds + Extremities: no edema, no cyanosis    The results of significant diagnostics from this hospitalization (including imaging, microbiology, ancillary and laboratory) are listed below for reference.     Microbiology: Recent Results (from the past 240 hour(s))  Blood culture (routine x 2)     Status: None (Preliminary result)   Collection Time: 01/19/23  4:12 PM   Specimen: BLOOD RIGHT ARM  Result Value Ref Range Status   Specimen Description BLOOD RIGHT ARM  Final   Special Requests   Final    BOTTLES DRAWN AEROBIC AND ANAEROBIC Blood Culture adequate volume   Culture   Final    NO GROWTH 2 DAYS Performed at [facility], [address], [place], [place] [address]    Report Status PENDING  Incomplete  Blood culture (routine x 2)     Status: None (Preliminary result)   Collection Time: 01/19/23  6:50 PM   Specimen: BLOOD RIGHT ARM  Result Value Ref Range Status   Specimen Description BLOOD RIGHT ARM  Final   Special Requests   Final    BOTTLES DRAWN AEROBIC AND ANAEROBIC Blood Culture adequate volume   Culture   Final    NO GROWTH 2 DAYS Performed  at [facility], [address] [place], [place] [address]    Report Status PENDING  Incomplete  Respiratory (~20 pathogens) panel by PCR     Status: Abnormal   Collection Time: 01/19/23  6:50 PM   Specimen: Respiratory  Result Value Ref Range Status   Adenovirus NOT DETECTED NOT DETECTED Final   Coronavirus 229E NOT DETECTED NOT DETECTED Final    Comment: (NOTE) The Coronavirus on the Respiratory Panel, DOES NOT test for the novel  Coronavirus (2019  nCoV)    Coronavirus HKU1 NOT DETECTED NOT DETECTED Final   Coronavirus NL63 NOT DETECTED NOT DETECTED Final   Coronavirus OC43 NOT DETECTED NOT DETECTED Final   Metapneumovirus NOT DETECTED NOT DETECTED Final   Rhinovirus / Enterovirus DETECTED (A) NOT DETECTED Final   Influenza A NOT DETECTED NOT DETECTED Final   Influenza B NOT DETECTED NOT DETECTED Final   Parainfluenza Virus 1 NOT DETECTED NOT DETECTED Final   Parainfluenza Virus 2 NOT DETECTED NOT DETECTED Final   Parainfluenza Virus 3 NOT DETECTED NOT DETECTED Final   Parainfluenza Virus 4 NOT DETECTED NOT DETECTED Final   Respiratory Syncytial Virus NOT DETECTED NOT DETECTED Final   Bordetella pertussis NOT DETECTED NOT DETECTED Final   Bordetella Parapertussis NOT DETECTED NOT DETECTED Final   Chlamydophila pneumoniae NOT DETECTED NOT DETECTED Final   Mycoplasma pneumoniae NOT DETECTED NOT DETECTED Final    Comment: Performed at [facility], [address], [place], [place] [address]  SARS Coronavirus 2 by RT PCR (hospital order, performed in [facility] hospital lab) *cepheid single result test*     Status: None   Collection Time: 01/19/23  6:50 PM   Specimen: Nasal Swab  Result Value Ref Range Status   SARS Coronavirus 2 by RT PCR NEGATIVE NEGATIVE Final    Comment: Performed at [facility], [address], [place], [place] [address]     Labs: BNP (last 3 results) Recent Labs    02/10/22 0951 03/30/22 1207 08/21/22 1020  BNP 884.3* 1,014.2*  1,224.1*   Basic Metabolic Panel: Recent Labs  Lab 01/18/23 0643 01/19/23 1608 01/20/23 0318 01/21/23 0854  NA 132* 134* 132* 132*  K 3.8 3.8 3.4* 3.8  CL 102 104 104 105  CO2 22 22 20* 20*  GLUCOSE 170* 148* 118* 132*  BUN 11 11 13 15   CREATININE 0.88 1.05 0.80 0.87  CALCIUM 8.8* 8.7* 8.6* 8.6*   Liver Function Tests: Recent Labs  Lab 01/20/23 0318  AST 19  ALT 15  ALKPHOS 34*  BILITOT 1.1  PROT 5.6*  ALBUMIN 2.6*   No results for input(s): "LIPASE", "AMYLASE" in the last 168 hours. No results for input(s): "AMMONIA" in the last 168 hours. CBC: Recent Labs  Lab 01/18/23 0643 01/19/23 1608 01/20/23 0318  WBC 6.6 6.2 6.0  NEUTROABS 4.8 4.1  --   HGB 10.5* 10.4* 9.9*  HCT 30.7* 30.7* 28.4*  MCV 100.7* 97.2 95.9  PLT 128* 124* 117*   Cardiac Enzymes: No results for input(s): "CKTOTAL", "CKMB", "CKMBINDEX", "TROPONINI" in the last 168 hours. BNP: Invalid input(s): "POCBNP" CBG: Recent Labs  Lab 01/18/23 0655  GLUCAP 169*   D-Dimer No results for input(s): "DDIMER" in the last 72 hours. Hgb A1c No results for input(s): "HGBA1C" in the last 72 hours. Lipid Profile No results for input(s): "CHOL", "HDL", "LDLCALC", "TRIG", "CHOLHDL", "LDLDIRECT" in the last 72 hours. Thyroid function studies No results for input(s): "TSH", "T4TOTAL", "T3FREE", "THYROIDAB" in the last 72 hours.  Invalid input(s): "FREET3" Anemia work up No results for input(s): "VITAMINB12", "FOLATE", "FERRITIN", "TIBC", "IRON", "RETICCTPCT" in the last 72 hours. Urinalysis    Component Value Date/Time   COLORURINE YELLOW 01/19/2023 1248   APPEARANCEUR CLEAR 01/19/2023 1248   LABSPEC 1.008 01/19/2023 1248   PHURINE 7.0 01/19/2023 1248   GLUCOSEU NEGATIVE 01/19/2023 1248   GLUCOSEU 250 (?) 02/12/2010 1055   HGBUR SMALL (A) 01/19/2023 1248   BILIRUBINUR NEGATIVE 01/19/2023 1248   KETONESUR NEGATIVE 01/19/2023 1248  PROTEINUR NEGATIVE 01/19/2023 1248   UROBILINOGEN 1.0 02/12/2010  1055   NITRITE NEGATIVE 01/19/2023 1248   LEUKOCYTESUR NEGATIVE 01/19/2023 1248   Sepsis Labs Recent Labs  Lab 01/18/23 0643 01/19/23 1608 01/20/23 0318  WBC 6.6 6.2 6.0   Microbiology Recent Results (from the past 240 hour(s))  Blood culture (routine x 2)     Status: None (Preliminary result)   Collection Time: 01/19/23  4:12 PM   Specimen: BLOOD RIGHT ARM  Result Value Ref Range Status   Specimen Description BLOOD RIGHT ARM  Final   Special Requests   Final    BOTTLES DRAWN AEROBIC AND ANAEROBIC Blood Culture adequate volume   Culture   Final    NO GROWTH 2 DAYS Performed at [facility], [address], [place], [place] [address]    Report Status PENDING  Incomplete  Blood culture (routine x 2)     Status: None (Preliminary result)   Collection Time: 01/19/23  6:50 PM   Specimen: BLOOD RIGHT ARM  Result Value Ref Range Status   Specimen Description BLOOD RIGHT ARM  Final   Special Requests   Final    BOTTLES DRAWN AEROBIC AND ANAEROBIC Blood Culture adequate volume   Culture   Final    NO GROWTH 2 DAYS Performed at [facility], [address] [place], [place] [address]    Report Status PENDING  Incomplete  Respiratory (~20 pathogens) panel by PCR     Status: Abnormal   Collection Time: 01/19/23  6:50 PM   Specimen: Respiratory  Result Value Ref Range Status   Adenovirus NOT DETECTED NOT DETECTED Final   Coronavirus 229E NOT DETECTED NOT DETECTED Final    Comment: (NOTE) The Coronavirus on the Respiratory Panel, DOES NOT test for the novel  Coronavirus (2019 nCoV)    Coronavirus HKU1 NOT DETECTED NOT DETECTED Final   Coronavirus NL63 NOT DETECTED NOT DETECTED Final   Coronavirus OC43 NOT DETECTED NOT DETECTED Final   Metapneumovirus NOT DETECTED NOT DETECTED Final   Rhinovirus / Enterovirus DETECTED (A) NOT DETECTED Final   Influenza A NOT DETECTED NOT DETECTED Final   Influenza B NOT DETECTED NOT DETECTED Final   Parainfluenza Virus 1 NOT  DETECTED NOT DETECTED Final   Parainfluenza Virus 2 NOT DETECTED NOT DETECTED Final   Parainfluenza Virus 3 NOT DETECTED NOT DETECTED Final   Parainfluenza Virus 4 NOT DETECTED NOT DETECTED Final   Respiratory Syncytial Virus NOT DETECTED NOT DETECTED Final   Bordetella pertussis NOT DETECTED NOT DETECTED Final   Bordetella Parapertussis NOT DETECTED NOT DETECTED Final   Chlamydophila pneumoniae NOT DETECTED NOT DETECTED Final   Mycoplasma pneumoniae NOT DETECTED NOT DETECTED Final    Comment: Performed at [facility], [address] [place], [place] [address]  SARS Coronavirus 2 by RT PCR (hospital order, performed in [facility] hospital lab) *cepheid single result test*     Status: None   Collection Time: 01/19/23  6:50 PM   Specimen: Nasal Swab  Result Value Ref Range Status   SARS Coronavirus 2 by RT PCR NEGATIVE NEGATIVE Final    Comment: Performed at [facility], [address], [place], [place] [address]    FURTHER DISCHARGE INSTRUCTIONS:   Get Medicines reviewed and adjusted: Please take all your medications with you for your next visit with your Primary MD   Laboratory/radiological data: Please request your Primary MD to go over all hospital tests and procedure/radiological results at  the follow up, please ask your Primary MD to get all Hospital records sent to his/her office.   In some cases, they will be blood work, cultures and biopsy results pending at the time of your discharge. Please request that your primary care M.D. goes through all the records of your hospital data and follows up on these results.   Also Note the following: If you experience worsening of your admission symptoms, develop shortness of breath, life threatening emergency, suicidal or homicidal thoughts you must seek medical attention immediately by calling 911 or calling your MD immediately  if symptoms less severe.   You must read complete instructions/literature along with all the  possible adverse reactions/side effects for all the Medicines you take and that have been prescribed to you. Take any new Medicines after you have completely understood and accpet all the possible adverse reactions/side effects.    Do not drive when taking Pain medications or sleeping medications (Benzodaizepines)   Do not take more than prescribed Pain, Sleep and Anxiety Medications. It is not advisable to combine anxiety,sleep and pain medications without talking with your primary care practitioner   Special Instructions: If you have smoked or chewed Tobacco  in the last 2 yrs please stop smoking, stop any regular Alcohol  and or any Recreational drug use.   Wear Seat belts while driving.   Please note: You were cared for by a hospitalist during your hospital stay. Once you are discharged, your primary care physician will handle any further medical issues. Please note that NO REFILLS for any discharge medications will be authorized once you are discharged, as it is imperative that you return to your primary care physician (or establish a relationship with a primary care physician if you do not have one) for your post hospital discharge needs so that they can reassess your need for medications and monitor your lab values  Time coordinating discharge: Over 30 minutes  SIGNED:   Hughie Closs, MD  [facility] 01/21/2023, 12:52 PM *Please note that this is a verbal dictation therefore any spelling or grammatical errors are due to the "Dragon Medical One" system interpretation. If 7PM-7AM, please contact night-coverage [email or web address]

## 2023-01-21 NOTE — TOC Transition Note (Signed)
Transition of Care [facility]) - CM/SW Discharge Note   Patient Details  Name: [name] MRN: [id number] Date of Birth: [birth year]  [facility]) CM/SW Contact:  Lou Irigoyen A [place], Theresia Majors Phone Number: 01/21/2023, 2:35 PM   Clinical Narrative:     Patient will DC to: [facility]  Anticipated DC date: 01/21/23  Family notified: Arline Asp  Transport by: Sharin Mons     Per MD patient ready for DC to [facility]. RN, patient, patient's family, and facility notified of DC. Discharge Summary and FL2 sent to facility. RN to call report prior to discharge ([phone], [phone]). DC packet on chart. Ambulance transport requested for patient.     CSW will sign off for now as social work intervention is no longer needed. Please consult [place] again if new needs arise.   Final next level of care: Skilled Nursing Facility Barriers to Discharge: Barriers Resolved   Patient Goals and CMS Choice      Discharge Placement                Patient chooses bed at: [facility] Patient to be transferred to facility by: [facility] Name of family member notified: Arline Asp Patient and family notified of of transfer: 01/21/23  Discharge Plan and Services Additional resources added to the After Visit Summary for                                       Social Determinants of Health (SDOH) Interventions SDOH Screenings   Food Insecurity: No Food Insecurity (01/21/2023)  Housing: Low Risk  (01/21/2023)  Transportation Needs: No Transportation Needs (01/21/2023)  Utilities: Not At Risk (01/21/2023)  Alcohol Screen: Low Risk  (01/26/2022)  Depression (PHQ2-9): Low Risk  (09/11/2022)  Financial Resource Strain: Low Risk  (01/26/2022)  Physical Activity: Sufficiently Active (08/19/2022)  Social Connections: Moderately Isolated (08/19/2022)  Stress: No Stress Concern Present (08/19/2022)  Tobacco Use: Low Risk  (01/18/2023)     Readmission Risk Interventions      No data to display

## 2023-01-22 DIAGNOSIS — J206 Acute bronchitis due to rhinovirus: Secondary | ICD-10-CM | POA: Diagnosis not present

## 2023-01-22 DIAGNOSIS — I5031 Acute diastolic (congestive) heart failure: Secondary | ICD-10-CM | POA: Diagnosis not present

## 2023-01-22 DIAGNOSIS — J440 Chronic obstructive pulmonary disease with acute lower respiratory infection: Secondary | ICD-10-CM | POA: Diagnosis not present

## 2023-01-23 ENCOUNTER — Other Ambulatory Visit: Payer: Self-pay | Admitting: Cardiovascular Disease

## 2023-01-23 LAB — CULTURE, BLOOD (ROUTINE X 2)
Culture: NO GROWTH
Special Requests: ADEQUATE
Special Requests: ADEQUATE

## 2023-01-24 LAB — CULTURE, BLOOD (ROUTINE X 2): Culture: NO GROWTH

## 2023-01-29 DIAGNOSIS — I110 Hypertensive heart disease with heart failure: Secondary | ICD-10-CM | POA: Diagnosis not present

## 2023-01-29 DIAGNOSIS — R269 Unspecified abnormalities of gait and mobility: Secondary | ICD-10-CM | POA: Diagnosis not present

## 2023-01-29 DIAGNOSIS — Z515 Encounter for palliative care: Secondary | ICD-10-CM | POA: Diagnosis not present

## 2023-01-29 DIAGNOSIS — R296 Repeated falls: Secondary | ICD-10-CM | POA: Diagnosis not present

## 2023-02-03 ENCOUNTER — Encounter (HOSPITAL_COMMUNITY): Payer: Self-pay | Admitting: Cardiology

## 2023-02-03 ENCOUNTER — Ambulatory Visit (HOSPITAL_COMMUNITY)
Admission: RE | Admit: 2023-02-03 | Discharge: 2023-02-03 | Disposition: A | Discharge status: Home / Self Care | Payer: Medicare HMO | Source: Ambulatory Visit | Attending: Cardiology | Admitting: Cardiology

## 2023-02-03 DIAGNOSIS — Z951 Presence of aortocoronary bypass graft: Secondary | ICD-10-CM | POA: Diagnosis not present

## 2023-02-03 DIAGNOSIS — I340 Nonrheumatic mitral (valve) insufficiency: Secondary | ICD-10-CM | POA: Diagnosis not present

## 2023-02-03 DIAGNOSIS — I5022 Chronic systolic (congestive) heart failure: Secondary | ICD-10-CM | POA: Diagnosis not present

## 2023-02-03 DIAGNOSIS — I2510 Atherosclerotic heart disease of native coronary artery without angina pectoris: Secondary | ICD-10-CM | POA: Diagnosis not present

## 2023-02-03 DIAGNOSIS — Z7902 Long term (current) use of antithrombotics/antiplatelets: Secondary | ICD-10-CM | POA: Insufficient documentation

## 2023-02-03 DIAGNOSIS — Z9181 History of falling: Secondary | ICD-10-CM | POA: Insufficient documentation

## 2023-02-03 DIAGNOSIS — I110 Hypertensive heart disease with heart failure: Secondary | ICD-10-CM | POA: Diagnosis not present

## 2023-02-03 DIAGNOSIS — R9431 Abnormal electrocardiogram [ECG] [EKG]: Secondary | ICD-10-CM | POA: Diagnosis not present

## 2023-02-03 DIAGNOSIS — I5042 Chronic combined systolic (congestive) and diastolic (congestive) heart failure: Secondary | ICD-10-CM | POA: Diagnosis not present

## 2023-02-03 LAB — BASIC METABOLIC PANEL
Anion gap: 9 (ref 5–15)
BUN: 14 mg/dL (ref 8–23)
CO2: 20 mmol/L — ABNORMAL LOW (ref 22–32)
Calcium: 9 mg/dL (ref 8.9–10.3)
Chloride: 105 mmol/L (ref 98–111)
Creatinine, Ser: 0.86 mg/dL (ref 0.61–1.24)
GFR, Estimated: >60 mL/min (ref >60)
Glucose, Bld: 149 mg/dL — ABNORMAL HIGH (ref 70–99)
Potassium: 4.1 mmol/L (ref 3.5–5.1)
Sodium: 134 mmol/L — ABNORMAL LOW (ref 135–145)

## 2023-02-03 LAB — DIGOXIN LEVEL: Digoxin Level: 0.6 ng/mL — ABNORMAL LOW (ref 0.8–2.0)

## 2023-02-03 LAB — BRAIN NATRIURETIC PEPTIDE: B Natriuretic Peptide: 970.2 pg/mL — ABNORMAL HIGH (ref 0.0–100.0)

## 2023-02-03 MED ORDER — VERQUVO 2.5 MG PO TABS: 5.0000 mg | ORAL_TABLET | Freq: Every day | ORAL | 11 refills | Status: DC

## 2023-02-03 NOTE — Patient Instructions (Signed)
INCREASE Verquvo to 5 mg daily.  Labs done today, your results will be available in MyChart, we will contact you for abnormal readings.  Your physician recommends that you schedule a follow-up appointment in: 2 months   If you have any questions or concerns before your next appointment please send [place] a message through [place] or call our office at [phone].    TO LEAVE A MESSAGE FOR THE NURSE SELECT OPTION 2, PLEASE LEAVE A MESSAGE INCLUDING: YOUR NAME DATE OF BIRTH CALL BACK NUMBER REASON FOR CALL**this is important as we prioritize the call backs  YOU WILL RECEIVE A CALL BACK THE SAME DAY AS LONG AS YOU CALL BEFORE 4:00 PM  At the [facility], you and your health needs are our priority. As part of our continuing mission to provide you with exceptional heart care, we have created designated Provider Care Teams. These Care Teams include your primary Cardiologist (physician) and Advanced Practice Providers (APPs- Physician Assistants and Nurse Practitioners) who all work together to provide you with the care you need, when you need it.   You may see any of the following providers on your designated Care Team at your next follow up: Dr Arvilla Meres Dr Marca Ancona Dr. Marcos Eke, NP Robbie Lis, [place] [facility] [place], [place] Brynda Peon, NP Karle Plumber, PharmD   Please be sure to bring in all your medications bottles to every appointment.    Thank you for choosing [facility]

## 2023-02-04 NOTE — Progress Notes (Signed)
Primary Care: Blane Ohara, MD HF Cardiology: Dr. Shirlee Latch  HPI: Patient referred to HF clinic from transition of care clinic for evaluation of CHF.   82 y.o. male with history of CAD s/p CABG in 2003 with subsequent PCI in 2007, DM II, chronic systolic CHF. He has been followed by Dr. Elease Hashimoto in the Cardiology clinic. Echo in 6/22 showed EF 50-55%, RV okay, RVSP normal, mild to moderate MR  He was admitted on 01/24/22 with progressive weakness and dyspnea. Echo with EF < 20%, RV mildly reduced, RVSP 80 mmHg, moderate to severe MR with concern for torn chord, mild to moderate TR.  [facility] 01/26/22 showed LIMA to LAD patent, patent SVG to distal RCA, occluded SVG to diagonal, SVG to Cx is occluded, RA mean 6, PA 41/23 (30), PCWP mean 21 (v 28), PA sat 58%, Fick CO/CI 4.5/2.6, Thermo CO/CI 3.4/1.9.  No targets for intervention on LHC.  TEE 01/28/22 showed EF < 20%, moderate to severe functional MR, highly mobile echogenic mass appears to be papillary muscle, RV moderately reduced.  He was diuresed with IV lasix. Difficulty tolerating GDMT due to low blood pressures.   He was seen by Dr. Excell Seltzer for consideration of mTEER.    He was started on cardiac medications, but subsequently, spironolactone, Jardiance, and losartan were all stopped due to hypotension and orthostasis.  Patient feels like the problems really started when he began [place].   He followed up with Dr. Excell Seltzer, and it was decided not to proceed with mTEER, moderate-severe MR and unclear how much his fatigue would improve with MR treatment.   Echo in 10/23 showed EF 25-30%, moderate LV dilation, normal RV, mod-severe ischemic MR, normal IVC.   Follow up 11/23, remained weak with NYHA III symptoms. Vericiguat 2.5 mg added. Seen in ED 06/13/22 after sustaining a mechanical fall. He hit his head but did not have LOC. CT head showed no large skull fracture of ICH. Discharged home.  Follow up (1/24) with Dr. Lalla Brothers to discuss CCM. He had  improvement in symptoms by addition of vericiguat and plan will be to continue medical therapy, and avoid invasive procedures.  Zio monitor in 3/24 showed no arrhythmias to explain his falls. He was admitted in 7/24 after a fall with no loss of consciousness.  He was found to have viral PNA (rhinovirus) and ended up discharged to SNF ([facility]).   Today he returns for HF follow up with his daughter. He is still living at [facility].  He has had 1 mechanical fall since his last hospitalization.  Thinks he just loses his balance.  No lightheadedness with standing, no loss of consciousness.  He is getting PT to help with balance.  He is not doing a lot of walking, but is able to walk up the hall at the SNF without dyspnea.  He gets fatigued easily.  No chest pain.  No orthopnea/PND.  Weight is up a few pounds.  BP is stable.   ECG (personally reviewed): NSR, IVCD 128 msec  Labs (723): LDL 85 Labs (8/23): K 4.3, creatinine 1.19 Labs (11/23): K 4.3, creatinine 0.84, LDL 87 Labs (1/24): K 4, creatinine 0.84 Labs (7/24): K 3.8, creatinine 0.87, hgb 9.9  Review of Systems: All systems reviewed and negative except as per HPI.   PMH: 1. BPH 2. COPD 3. HTN 4. H/o ITP 5. Type 2 diabetes 6. Hyperlipidemia 7. CAD: S/p CABG.  - LHC 01/26/22 showed LIMA to LAD patent, patent SVG to distal RCA, occluded SVG  to diagonal, SVG to Cx is occluded 8. Chronic systolic CHF: Ischemic cardiomyopathy.  - Echo (7/23): EF < 20%, RV mildly reduced, RVSP 80 mmHg, moderate to severe MR with concern for torn chord, mild to moderate TR. - RHC (7/23):  RA mean 6, PA 41/23 (30), PCWP mean 21 (v 28), PA sat 58%, Fick CO/CI 4.5/2.6, Thermo CO/CI 3.4/1.9 - Echo (10/23): EF 25-30%, moderate LV dilation, normal RV, mod-severe ischemic MR, normal IVC.  9. Mitral regurgitation: Functional.  - TEE 01/28/22 showed EF < 20%, moderate to severe functional MR, highly mobile echogenic mass appears to be papillary muscle, RV moderately reduced.   10. Frequent falls: Zio monitor (3/24) showed no atrial fibrillation, rare PVCs/PACs.  Nothing to explain falls.   Current Outpatient Medications  Medication Sig Dispense Refill   cholecalciferol (VITAMIN D3) 25 MCG (1000 UT) tablet Take 5,000 Units by mouth daily.     clopidogrel (PLAVIX) 75 MG tablet Take 1 tablet by mouth once daily 90 tablet 0   digoxin (LANOXIN) 0.125 MG tablet Take 1 tablet (0.125 mg total) by mouth daily. 90 tablet 3   ezetimibe (ZETIA) 10 MG tablet Take 1 tablet by mouth once daily 90 tablet 0   furosemide (LASIX) 20 MG tablet Take 1 tablet (20 mg total) by mouth every Monday, Wednesday, and Friday. 45 tablet 3   glucose blood (ONETOUCH ULTRA) test strip AS DIRECTED 100 each 4   Insulin Pen Needle (BD PEN NEEDLE NANO U/F) 32G X 4 MM MISC 1 each by Does not apply route daily. 100 each 3   Lancets MISC 1 each by Does not apply route daily as needed. Please provide lancets that fit patients lancet device 100 each 4   megestrol (MEGACE) 40 MG tablet Take 1 tablet (40 mg total) by mouth daily. 30 tablet 2   metFORMIN (GLUCOPHAGE) 500 MG tablet Take 1 tablet by mouth 4 times daily 720 tablet 0   metoprolol succinate (TOPROL-XL) 25 MG 24 hr tablet Take 0.5 tablets (12.5 mg total) by mouth at bedtime. 30 tablet 3   Multiple Vitamin (MULTIVITAMIN) tablet Take 1 tablet by mouth daily.     nitroGLYCERIN (NITROSTAT) 0.4 MG SL tablet Place 1 tablet (0.4 mg total) under the tongue every 5 (five) minutes as needed for chest pain. 90 tablet 3   potassium chloride SA (KLOR-CON M) 20 MEQ tablet Take 1 tablet (20 mEq total) by mouth every Monday, Wednesday, and Friday. 45 tablet 3   Vericiguat (VERQUVO) 2.5 MG TABS Take 2 tablets (5 mg total) by mouth daily. 30 tablet 11   No current facility-administered medications for this encounter.   Allergies  Allergen Reactions   Lipitor [Atorvastatin Calcium] Other (See Comments)    Causes memory loss   Crestor [Rosuvastatin Calcium] Other  (See Comments)    Causes Muscle Pain   Zocor [Simvastatin] Other (See Comments)    Causes muscle pain   Social History   Socioeconomic History   Marital status: Married    Spouse name: Doris   Number of children: 3   Years of education: Not on file   Highest education level: High school graduate  Occupational History   Occupation: Retired   Tobacco Use   Smoking status: Never   Smokeless tobacco: Never  Vaping Use   Vaping status: Never Used  Substance and Sexual Activity   Alcohol use: No   Drug use: No   Sexual activity: Not Currently  Other Topics Concern   Not on  file  Social History Narrative   Daily caffeine use    Social Determinants of Health   Financial Resource Strain: Low Risk  (01/26/2022)   Overall Financial Resource Strain (CARDIA)    Difficulty of Paying Living Expenses: Not hard at all  Food Insecurity: No Food Insecurity (01/21/2023)   Hunger Vital Sign    Worried About Running Out of Food in the Last Year: Never true    Ran Out of Food in the Last Year: Never true  Transportation Needs: No Transportation Needs (01/21/2023)   PRAPARE - Administrator, Civil Service (Medical): No    Lack of Transportation (Non-Medical): No  Physical Activity: Sufficiently Active (08/19/2022)   Exercise Vital Sign    Days of Exercise per Week: 7 days    Minutes of Exercise per Session: 30 min  Stress: No Stress Concern Present (08/19/2022)   [employer] of Occupational Health - Occupational Stress Questionnaire    Feeling of Stress : Not at all  Social Connections: Moderately Isolated (08/19/2022)   Social Connection and Isolation Panel [NHANES]    Frequency of Communication with Friends and Family: Three times a week    Frequency of Social Gatherings with Friends and Family: Three times a week    Attends Religious Services: More than 4 times per year    Active Member of Clubs or Organizations: No    Attends Banker Meetings: Never    Marital  Status: Widowed  Intimate Partner Violence: Not At Risk (01/21/2023)   Humiliation, Afraid, Rape, and Kick questionnaire    Fear of Current or Ex-Partner: No    Emotionally Abused: No    Physically Abused: No    Sexually Abused: No   Family History  Problem Relation Age of Onset   Cerebrovascular Accident Mother    Colon cancer Neg Hx    There were no vitals taken for this visit.  Wt Readings from Last 3 Encounters:  01/21/23 68 kg (149 lb 14.6 oz)  11/18/22 66.2 kg (146 lb)  08/21/22 64.4 kg (142 lb)   PHYSICAL EXAM: General: NAD, frail Neck: No JVD, no thyromegaly or thyroid nodule.  Lungs: Clear to auscultation bilaterally with normal respiratory effort. CV: Nondisplaced PMI.  Heart regular S1/S2, no S3/S4, no murmur.  No peripheral edema.  No carotid bruit.  Normal pedal pulses.  Abdomen: Soft, nontender, no hepatosplenomegaly, no distention.  Skin: Intact without lesions or rashes.  Neurologic: Alert and oriented x 3.  Psych: Normal affect. Extremities: No clubbing or cyanosis.  HEENT: Normal.   ASSESSMENT & PLAN: 1. Chronic systolic CHF: Ischemic cardiomyopathy. Echo 7/23 showed EF < 20%, RV mildly reduced, RVSP 80 mmHg, moderate to severe MR, mild to moderate TR.  R/LHC in 7/23 showed LIMA to LAD patent, patent SVG to distal RCA, occluded SVG to diagonal, SVG to Cx is occluded, mildly elevated PCWP, Fick CO/CI 4.5/2.6, Thermo CO/CI 3.4/1.9.  No targets for intervention on LHC.  TEE in 7/23 showed EF < 20%, moderate to severe functional MR, highly mobile echogenic mass appears to be papillary muscle, RV moderately reduced. Echo in 10/23 showed EF 25-30%, moderate LV dilation, normal RV, mod-severe ischemic MR, normal IVC.  GDMT has been limited by hypotension/orthostasis. NYHA class III symptoms, more limited by fatigue than dyspnea.  He is not volume overloaded.  - Continue Toprol XL 12.5 mg qhs. - Continue digoxin 0.125 mg daily. Check digoxin level today. - He feels like  vericiguat has helped, increase to  5 mg daily.  - BMET/BNP today.  - He seems to be intolerant of Jardiance.  - Unable to tolerate low dose spironolactone.  - I suspect he would not tolerate ARB/ACEI/ARNI.  - QRS not wide enough to be CRT candidate.  Given age, no primary prevention ICD.  - He has seen EP to discuss CCM. Plan to continue medical management and defer invasive procedures.  2. Mitral regurgitation: TEE in 7/23 suggests moderate-severe MR, likely functional.  Seen by structural heart team during 7/23 admit. It was recommended to optimize GDMT for HF and repeat echo in 2-3 months. Echo in 10/23 showed moderate-severe MR.  He saw Dr. Excell Seltzer and they decided against mTEER due to frailty and uncertainty that he would benefit significantly.  3. CAD: Hx CABG in 2003 and PCI in 2007.  LHC 07/23 with LIMA to LAD patent, patent SVG to distal RCA, occluded SVG to diagonal, SVG to Cx is occluded. No targets for intervention. No ischemic chest pain.  - Continue Plavix 75 mg daily.  - Did not tolerate statins due to myalgias and memory loss, has been on Zetia.  4. Frequent Falls: Not related to orthostatic symptoms or loss of consciousness.  Seem to be balance-related.  Zio monitor in 3/24 showed no worrisome arrhythmias.  - Continue PT for balance training.   Followup 2 months APP  Marca Ancona  02/04/2023

## 2023-02-09 ENCOUNTER — Telehealth: Payer: Self-pay

## 2023-02-09 NOTE — Telephone Encounter (Signed)
Dr. Sedalia Muta,  When and where can we schedule this patient?  A lady with [facility] called stating that the patient is going to be discharged today and will need an appointment within 14 days. She was notified that we do not have an opening within the timeframe that he needs to be seen and will follow-up with the provider for further advisement. She was told that we would call the patient back with an appointment. The patient has an appointment on 8/19 but the lady with [facility] stated that he will have to be seen sooner.  Admitted into [facility] on 7/11 Patient was admitted at [facility] on 7/8

## 2023-02-09 NOTE — Telephone Encounter (Signed)
Appointment has been scheduled.

## 2023-02-11 ENCOUNTER — Telehealth: Payer: Self-pay

## 2023-02-11 DIAGNOSIS — E1142 Type 2 diabetes mellitus with diabetic polyneuropathy: Secondary | ICD-10-CM | POA: Diagnosis not present

## 2023-02-11 DIAGNOSIS — J449 Chronic obstructive pulmonary disease, unspecified: Secondary | ICD-10-CM | POA: Diagnosis not present

## 2023-02-11 DIAGNOSIS — J189 Pneumonia, unspecified organism: Secondary | ICD-10-CM | POA: Diagnosis not present

## 2023-02-11 DIAGNOSIS — Z7984 Long term (current) use of oral hypoglycemic drugs: Secondary | ICD-10-CM | POA: Diagnosis not present

## 2023-02-11 DIAGNOSIS — I110 Hypertensive heart disease with heart failure: Secondary | ICD-10-CM | POA: Diagnosis not present

## 2023-02-11 DIAGNOSIS — J440 Chronic obstructive pulmonary disease with acute lower respiratory infection: Secondary | ICD-10-CM | POA: Diagnosis not present

## 2023-02-11 DIAGNOSIS — E1151 Type 2 diabetes mellitus with diabetic peripheral angiopathy without gangrene: Secondary | ICD-10-CM | POA: Diagnosis not present

## 2023-02-11 DIAGNOSIS — M6281 Muscle weakness (generalized): Secondary | ICD-10-CM | POA: Diagnosis not present

## 2023-02-11 DIAGNOSIS — I2510 Atherosclerotic heart disease of native coronary artery without angina pectoris: Secondary | ICD-10-CM | POA: Diagnosis not present

## 2023-02-11 DIAGNOSIS — I5032 Chronic diastolic (congestive) heart failure: Secondary | ICD-10-CM | POA: Diagnosis not present

## 2023-02-11 NOTE — Telephone Encounter (Signed)
Patient's daughter called stating that he was recently discharged home from [facility] and has an appointment here next Wednesday the 7th.  She would like to get him in sooner if possible because he needs assistance at home.  He is unable to get out of bed by himself or walk without assistance.  She states that she thinks that he was discharged too soon.  Do you have anywhere on your schedule to work him in sooner?

## 2023-02-11 NOTE — Telephone Encounter (Signed)
I called to offer earlier appointment and it was declined because she thought he would be more comfortable with Dr Sedalia Muta on Wednesday.  She stated that [facility] came out today to continue PT that patient was doing before hospitalization.

## 2023-02-15 ENCOUNTER — Telehealth: Payer: Self-pay

## 2023-02-15 NOTE — Telephone Encounter (Signed)
Billie from [facility] called requesting a referral for Nursing and Home Physical Therapy.  They do not offer an Home Care Aid but they will evaluate and let us know what is needed

## 2023-02-17 ENCOUNTER — Ambulatory Visit (INDEPENDENT_AMBULATORY_CARE_PROVIDER_SITE_OTHER): Payer: Medicare HMO | Admitting: Family Medicine

## 2023-02-17 ENCOUNTER — Other Ambulatory Visit: Payer: Self-pay

## 2023-02-17 ENCOUNTER — Encounter: Payer: Self-pay | Admitting: Family Medicine

## 2023-02-17 VITALS — BP 132/74 | HR 78 | Temp 97.1°F | Ht 69.0 in | Wt 149.0 lb

## 2023-02-17 DIAGNOSIS — J189 Pneumonia, unspecified organism: Secondary | ICD-10-CM

## 2023-02-17 DIAGNOSIS — B9689 Other specified bacterial agents as the cause of diseases classified elsewhere: Secondary | ICD-10-CM | POA: Diagnosis not present

## 2023-02-17 DIAGNOSIS — R5381 Other malaise: Secondary | ICD-10-CM

## 2023-02-17 DIAGNOSIS — R3911 Hesitancy of micturition: Secondary | ICD-10-CM | POA: Diagnosis not present

## 2023-02-17 DIAGNOSIS — R296 Repeated falls: Secondary | ICD-10-CM

## 2023-02-17 DIAGNOSIS — E1142 Type 2 diabetes mellitus with diabetic polyneuropathy: Secondary | ICD-10-CM | POA: Diagnosis not present

## 2023-02-17 DIAGNOSIS — I110 Hypertensive heart disease with heart failure: Secondary | ICD-10-CM

## 2023-02-17 DIAGNOSIS — I5089 Other heart failure: Secondary | ICD-10-CM

## 2023-02-17 DIAGNOSIS — D693 Immune thrombocytopenic purpura: Secondary | ICD-10-CM | POA: Diagnosis not present

## 2023-02-17 DIAGNOSIS — H109 Unspecified conjunctivitis: Secondary | ICD-10-CM

## 2023-02-17 LAB — POCT URINALYSIS DIP (CLINITEK)
Bilirubin, UA: NEGATIVE
Glucose, UA: NEGATIVE mg/dL
Ketones, POC UA: NEGATIVE mg/dL
Leukocytes, UA: NEGATIVE
Nitrite, UA: NEGATIVE
POC PROTEIN,UA: NEGATIVE
Spec Grav, UA: >=1.03 — AB (ref 1.010–1.025)
Urobilinogen, UA: 0.2 E.U./dL
pH, UA: 5.5 (ref 5.0–8.0)

## 2023-02-17 MED ORDER — GENTAMICIN SULFATE 0.3 % OP SOLN
2.0000 [drp] | Freq: Four times a day (QID) | OPHTHALMIC | 0 refills | Status: AC
Start: 2023-02-17 — End: 2023-02-24

## 2023-02-17 NOTE — Patient Instructions (Signed)
Increase protein intake 

## 2023-02-17 NOTE — Progress Notes (Unsigned)
Subjective:  Patient ID: [name], male    DOB: [birth year]  Age: 82 y.o. MRN: [id number]  Chief Complaint  Patient presents with   Hospitalization Follow-up    HPI Accompanied by his daughter.   [name] is a 82 y.o. male with medical history significant of diabetes, neuropathy, hypertension, hyperlipidemia, CHF, weakness, falls, statin myopathy, CAD, COPD, BPH, MAT presented to [facility] emergency department on January 18, 2023 after a fall. In the emergency department there was discussion about him returning home, but he then became acutely confused.  CT of the head and C-spine showed no acute abnormalities.  Chest x-ray was found to have bronchopneumonia and small pleural effusion.  CT of the chest yesterday showed groundglass opacities suggestive of pneumonia with small effusion.  Some cystic structures noted in the upper abdomen resulted in further scanning with a CT of the abdomen.  Patient also with severe compression deformity of T12 which is age indeterminant.  Had aortic atherosclerosis.  Patient was admitted and started on IV antibiotics.  He tested positive for rhinovirus confirming this was a viral pneumonia and antibiotics were discontinued.  Patient's acute metabolic encephalopathy improved and was felt to be secondary to the viral pneumonia.  He was discharged to [facility], a skilled nursing facility on January 21, 2023 due to deconditioning.  Final diagnoses included acute encephalopathy, viral pneumonia secondary to rhinovirus, generalized weakness and deconditioning.  Other chronic diagnoses include CAD with stable angina, hypertensive heart disease with CHF, mixed hyperlipidemia, BPH, type 2 diabetes, COPD and diabetic neuropathy.  Other tests that include blood cultures x 2 which were negative.  A full viral panel which was negative other than the rhinovirus.,  COVID-negative.  Kidney function normal.  Liver function normal.  Albumin was low at 2.6.  Calcium was low at  8.8.  Sugars ranged from 119-170.  Anemia: Hemoglobin 10.5.  Platelets low at 128.  UA normal except a small amount of blood.  Upon discharge from [facility] home home health care was ordered through [facility].  Problems/ambulation, home safety strength training and med management. PT, OT, skilled nursing was ordered. PT 3x week. Patient family is very concerned about leaving him alone during the day.  They check on him every day but also work outside of the home.  At home readings since d/c have been: 111/64 p83 FBS 164  92/56 p96 FBS 140  98/66 p90 FBS 232  115/77 p81 FBS 115  117/66 p87 FBS 140  131/81 p84 FBS 140  123/74 p87 FBS 147     02/17/2023    1:40 PM 09/11/2022   11:12 AM 08/19/2022    8:25 AM 05/13/2022    1:02 PM 05/13/2021    8:13 AM  Depression screen PHQ 2/9  Decreased Interest 0 0 0 0 0  Down, Depressed, Hopeless 0 0 0 0 0  PHQ - 2 Score 0 0 0 0 0        02/17/2023    1:40 PM  Fall Risk   Falls in the past year? 1  Number falls in past yr: 1  Injury with Fall? 1  Risk for fall due to : Impaired balance/gait;History of fall(s)  Follow up Falls evaluation completed;Falls prevention discussed    Patient Care Team: Blane Ohara, MD as PCP - General (Family Medicine) Lanier Prude, MD as PCP - Electrophysiology (Cardiology)   Review of Systems  Constitutional:  Negative for chills, fatigue and fever.  HENT:  Negative for congestion, ear pain and sore throat.   Eyes:  Positive for discharge (BL).  Respiratory:  Positive for cough and shortness of breath.   Cardiovascular:  Negative for chest pain.  Gastrointestinal:  Negative for abdominal pain, constipation, diarrhea, nausea and vomiting.  Endocrine: Negative for polydipsia, polyphagia and polyuria.  Genitourinary:  Negative for dysuria and frequency.  Musculoskeletal:  Negative for arthralgias and myalgias.  Neurological:  Negative for dizziness and headaches.  Psychiatric/Behavioral:   Negative for dysphoric mood.        No dysphoria    Current Outpatient Medications on File Prior to Visit  Medication Sig Dispense Refill   cholecalciferol (VITAMIN D3) 25 MCG (1000 UT) tablet Take 5,000 Units by mouth daily.     clopidogrel (PLAVIX) 75 MG tablet Take 1 tablet by mouth once daily 90 tablet 0   digoxin (LANOXIN) 0.125 MG tablet Take 1 tablet (0.125 mg total) by mouth daily. 90 tablet 3   ezetimibe (ZETIA) 10 MG tablet Take 1 tablet by mouth once daily 90 tablet 0   furosemide (LASIX) 20 MG tablet Take 1 tablet (20 mg total) by mouth every Monday, Wednesday, and Friday. 45 tablet 3   glucose blood (ONETOUCH ULTRA) test strip AS DIRECTED 100 each 4   Insulin Pen Needle (BD PEN NEEDLE NANO U/F) 32G X 4 MM MISC 1 each by Does not apply route daily. 100 each 3   Lancets MISC 1 each by Does not apply route daily as needed. Please provide lancets that fit patients lancet device 100 each 4   megestrol (MEGACE) 40 MG tablet Take 1 tablet (40 mg total) by mouth daily. (Patient not taking: Reported on 02/17/2023) 30 tablet 2   metFORMIN (GLUCOPHAGE) 500 MG tablet Take 1 tablet by mouth 4 times daily (Patient taking differently: Take 500 mg by mouth 3 (three) times daily.) 720 tablet 0   metoprolol succinate (TOPROL-XL) 25 MG 24 hr tablet Take 0.5 tablets (12.5 mg total) by mouth at bedtime. 30 tablet 3   Multiple Vitamin (MULTIVITAMIN) tablet Take 1 tablet by mouth daily.     nitroGLYCERIN (NITROSTAT) 0.4 MG SL tablet Place 1 tablet (0.4 mg total) under the tongue every 5 (five) minutes as needed for chest pain. 90 tablet 3   potassium chloride SA (KLOR-CON M) 20 MEQ tablet Take 1 tablet (20 mEq total) by mouth every Monday, Wednesday, and Friday. 45 tablet 3   Vericiguat (VERQUVO) 2.5 MG TABS Take 2 tablets (5 mg total) by mouth daily. 30 tablet 11   No current facility-administered medications on file prior to visit.   Past Medical History:  Diagnosis Date   Acute exacerbation of CHF  (congestive heart failure) (HCC) 09/29/2011   Atherosclerotic heart disease of native coronary artery without angina pectoris 02/12/2010   Qualifier: Diagnosis of  By: Alesia Richards     Benign prostatic hyperplasia with lower urinary tract symptoms 12/04/2019   CAD (coronary artery disease)    Hx of    Cellulitis 03/19/2012   Chronic obstructive pulmonary disease (HCC) 12/04/2019   Diverticulosis of colon (without mention of hemorrhage) 2011   Colonoscopy    Hiatal hernia 2011   EGD   Hypertension    Hypertensive heart disease without heart failure 12/04/2019   ITP (idiopathic thrombocytopenic purpura)    He has been diagnosed with   Pancytopenia    Thrombocytopenia, unspecified (HCC) 01/24/2013   TRANSAMINASES, SERUM, ELEVATED 02/12/2010   Qualifier: Diagnosis of  By: Kem Boroughs,  Barbara     Type 2 diabetes mellitus with diabetic peripheral angiopathy without gangrene (HCC) 12/04/2019   Past Surgical History:  Procedure Laterality Date   CARDIAC CATHETERIZATION     His last heart catheterization in May of 2009 reveals a patent LIMA   CORONARY ANGIOPLASTY     Successful percutaneous transluminal coronary angioplasty of the left circumflex obuse marginal vessel   CORONARY ANGIOPLASTY WITH STENT PLACEMENT     CORONARY ARTERY BYPASS GRAFT     x4 with a left internal mammary artery anastomosis to the left anterior descending coronary artery    CORONARY STENT PLACEMENT     successful percutaneous transluminal coronary angioplasty and stenting of the left main coronary artery   HERNIA REPAIR     RIGHT/LEFT HEART CATH AND CORONARY/GRAFT ANGIOGRAPHY N/A 01/26/2022   Procedure: RIGHT/LEFT HEART CATH AND CORONARY/GRAFT ANGIOGRAPHY;  Surgeon: Lennette Bihari, MD;  Location: [facility] INVASIVE CV LAB;  Service: Cardiovascular;  Laterality: N/A;   SAPHENOUS VEIN GRAFT RESECTION     graft to the first obtuse marginal, a saphenous vein graft to the first diagonal coronary artery  and a saphenous  vein graft to the distal right coronary artery   TEE WITHOUT CARDIOVERSION N/A 01/28/2022   Procedure: TRANSESOPHAGEAL ECHOCARDIOGRAM (TEE);  Surgeon: Thomasene Ripple, DO;  Location: [facility] ENDOSCOPY;  Service: Cardiovascular;  Laterality: N/A;   US ECHOCARDIOGRAPHY  02-08-2007   Est. EF 40-45%    Family History  Problem Relation Age of Onset   Cerebrovascular Accident Mother    Colon cancer Neg Hx    Social History   Socioeconomic History   Marital status: Married    Spouse name: Doris   Number of children: 3   Years of education: Not on file   Highest education level: High school graduate  Occupational History   Occupation: Retired   Tobacco Use   Smoking status: Never   Smokeless tobacco: Never  Vaping Use   Vaping status: Never Used  Substance and Sexual Activity   Alcohol use: No   Drug use: No   Sexual activity: Not Currently  Other Topics Concern   Not on file  Social History Narrative   Daily caffeine use    Social Determinants of Health   Financial Resource Strain: Low Risk  (01/26/2022)   Overall Financial Resource Strain (CARDIA)    Difficulty of Paying Living Expenses: Not hard at all  Food Insecurity: No Food Insecurity (01/21/2023)   Hunger Vital Sign    Worried About Running Out of Food in the Last Year: Never true    Ran Out of Food in the Last Year: Never true  Transportation Needs: No Transportation Needs (01/21/2023)   PRAPARE - Administrator, Civil Service (Medical): No    Lack of Transportation (Non-Medical): No  Physical Activity: Sufficiently Active (08/19/2022)   Exercise Vital Sign    Days of Exercise per Week: 7 days    Minutes of Exercise per Session: 30 min  Stress: No Stress Concern Present (08/19/2022)   [employer] of Occupational Health - Occupational Stress Questionnaire    Feeling of Stress : Not at all  Social Connections: Moderately Isolated (08/19/2022)   Social Connection and Isolation Panel [NHANES]    Frequency of  Communication with Friends and Family: Three times a week    Frequency of Social Gatherings with Friends and Family: Three times a week    Attends Religious Services: More than 4 times per year    Active  Member of Clubs or Organizations: No    Attends Banker Meetings: Never    Marital Status: Widowed    Objective:  BP 132/74   Pulse 78   Temp (!) 97.1 F (36.2 C)   Ht 5\' 9"  (1.753 m)   Wt 149 lb (67.6 kg)   SpO2 94%   BMI 22.00 kg/m      02/17/2023    1:38 PM 01/21/2023    8:03 AM 01/21/2023    5:15 AM  BP/Weight  Systolic BP 132 111 111  Diastolic BP 74 74 70  Wt. (Lbs) 149    BMI 22 kg/m2      Physical Exam Vitals reviewed.  Constitutional:      Appearance: Normal appearance. He is normal weight.  Eyes:     General:        Right eye: Discharge present.        Left eye: Discharge present. Cardiovascular:     Rate and Rhythm: Normal rate and regular rhythm.     Heart sounds: Murmur heard.  Pulmonary:     Effort: Pulmonary effort is normal.     Breath sounds: Normal breath sounds.  Abdominal:     General: Abdomen is flat. Bowel sounds are normal.     Palpations: Abdomen is soft.     Tenderness: There is no abdominal tenderness.  Neurological:     Mental Status: He is alert and oriented to person, place, and time.  Psychiatric:        Mood and Affect: Mood normal.        Behavior: Behavior normal.     Diabetic Foot Exam - Simple   No data filed      Lab Results  Component Value Date   WBC 7.3 02/17/2023   HGB 11.6 (L) 02/17/2023   HCT 34.0 (L) 02/17/2023   PLT 179 02/17/2023   GLUCOSE 147 (H) 02/17/2023   CHOL 142 08/19/2022   TRIG 61 08/19/2022   HDL 55 08/19/2022   LDLCALC 74 08/19/2022   ALT 12 02/17/2023   AST 16 02/17/2023   NA 137 02/17/2023   K 4.7 02/17/2023   CL 100 02/17/2023   CREATININE 0.92 02/17/2023   BUN 14 02/17/2023   CO2 21 02/17/2023   TSH 2.684 01/24/2022   INR 1.3 (H) 03/24/2011   HGBA1C 6.2 (H)  08/19/2022   MICROALBUR 10 05/13/2021      Assessment & Plan:    Diabetic polyneuropathy associated with type 2 diabetes mellitus (HCC) Assessment & Plan: Control: well control Recommend check sugars fasting daily. Recommend check feet daily. Recommend annual eye exams. Medicines: Decrease metformin 500 mg one pill before breakfast and supper.  Continue to work on eating a healthy diet and exercise.  Labs drawn today.     Orders: -     Hemoglobin A1c  Hypertensive heart disease with other congestive heart failure (HCC) Assessment & Plan: Severe. EF 20%. Moderate MR.  Management per specialist, but  No changes to medicines. On vericiquat 2.5 mg, toprol xl 25 mg daily, and digoxin 0.125 mg daily. Continue to work on eating a healthy diet and exercise.  Labs drawn today.   Orders: -     CBC with Differential/Platelet -     Comprehensive metabolic panel -     AMB Referral to Community Care Coordinaton (ACO Patients) -     Lipid panel  Urinary hesitancy Assessment & Plan: Check UA and Order Urine culture.  Orders: -  POCT URINALYSIS DIP (CLINITEK) -     Urine Culture  Acute ITP (HCC) Assessment & Plan: Platelets stable.   Pneumonia due to infectious organism, unspecified laterality, unspecified part of lung Assessment & Plan: Order Chest xray.  Orders: -     DG Chest 2 View  Bacterial conjunctivitis of both eyes -     Gentamicin Sulfate; Place 2 drops into both eyes 4 (four) times daily for 7 days.  Dispense: 5 mL; Refill: 0  Physical deconditioning Assessment & Plan: Continue home therapy. Given resources for personal care services   Frequent falls Assessment & Plan: Continue home therapy.  Needs personal care services.      Meds ordered this encounter  Medications   gentamicin (GARAMYCIN) 0.3 % ophthalmic solution    Sig: Place 2 drops into both eyes 4 (four) times daily for 7 days.    Dispense:  5 mL    Refill:  0    Orders Placed This  Encounter  Procedures   Urine Culture   DG Chest 2 View   CBC with Differential/Platelet   Comprehensive metabolic panel   Lipid panel   Hemoglobin A1c   AMB Referral to Community Care Coordinaton (ACO Patients)   POCT URINALYSIS DIP (CLINITEK)     Follow-up: Return in about 3 months (around 05/20/2023) for chronic follow up.   I,Aadi Bordner,acting as a Neurosurgeon for Blane Ohara, MD.,have documented all relevant documentation on the behalf of Blane Ohara, MD,as directed by  Blane Ohara, MD while in the presence of Blane Ohara, MD.   Clayborn Bigness I Leal-Borjas,acting as a scribe for Blane Ohara, MD.,have documented all relevant documentation on the behalf of Blane Ohara, MD,as directed by  Blane Ohara, MD while in the presence of Blane Ohara, MD.    An After Visit Summary was printed and given to the patient.  Blane Ohara, MD [facility] [phone]

## 2023-02-18 ENCOUNTER — Telehealth: Payer: Self-pay | Admitting: *Deleted

## 2023-02-18 NOTE — Progress Notes (Signed)
  Care Coordination   Note   02/18/2023 Name: [name] MRN: [id number] DOB: 07-21-1940  [name] is a 82 y.o. year old male who sees Cox, Kirsten, MD for primary care. I reached out to [name] by phone today to offer care coordination services.  [name] was given information about Care Coordination services today including:   The Care Coordination services include support from the care team which includes your Nurse Coordinator, Clinical Social Worker, or Pharmacist.  The Care Coordination team is here to help remove barriers to the health concerns and goals most important to you. Care Coordination services are voluntary, and the patient may decline or stop services at any time by request to their care team member.   Care Coordination Consent Status: Patient agreed to services and verbal consent obtained.   Follow up plan:  Telephone appointment with care coordination team member scheduled for:  02/22/2023  Encounter Outcome:  Pt. Scheduled from referral   Burman Nieves, [facility] Care Coordination Care Guide Direct Dial: [phone]

## 2023-02-19 ENCOUNTER — Telehealth: Payer: Self-pay | Admitting: Family Medicine

## 2023-02-19 NOTE — Telephone Encounter (Signed)
[facility] HOME HEALTH POC [phone]

## 2023-02-20 DIAGNOSIS — R3911 Hesitancy of micturition: Secondary | ICD-10-CM | POA: Insufficient documentation

## 2023-02-20 NOTE — Assessment & Plan Note (Signed)
Order Chest xray.

## 2023-02-20 NOTE — Assessment & Plan Note (Signed)
Control: well control Recommend check sugars fasting daily. Recommend check feet daily. Recommend annual eye exams. Medicines: Decrease metformin 500 mg one pill before breakfast and supper.  Continue to work on eating a healthy diet and exercise.  Labs drawn today.

## 2023-02-20 NOTE — Assessment & Plan Note (Signed)
Platelets stable 

## 2023-02-20 NOTE — Assessment & Plan Note (Signed)
Check UA and Order Urine culture.

## 2023-02-20 NOTE — Assessment & Plan Note (Signed)
Severe. EF 20%. Moderate MR.  Management per specialist, but  No changes to medicines. On vericiquat 2.5 mg, toprol xl 25 mg daily, and digoxin 0.125 mg daily. Continue to work on eating a healthy diet and exercise.  Labs drawn today.

## 2023-02-21 DIAGNOSIS — R296 Repeated falls: Secondary | ICD-10-CM | POA: Insufficient documentation

## 2023-02-21 DIAGNOSIS — H109 Unspecified conjunctivitis: Secondary | ICD-10-CM | POA: Insufficient documentation

## 2023-02-21 DIAGNOSIS — R5381 Other malaise: Secondary | ICD-10-CM | POA: Insufficient documentation

## 2023-02-21 NOTE — Assessment & Plan Note (Signed)
Continue home therapy. Given resources for personal care services

## 2023-02-21 NOTE — Assessment & Plan Note (Signed)
Continue home therapy.  Needs personal care services.

## 2023-02-22 ENCOUNTER — Ambulatory Visit: Payer: Self-pay

## 2023-02-22 NOTE — Patient Instructions (Signed)
Visit Information  Thank you for taking time to visit with me today. Please don't hesitate to contact me if I can be of assistance to you.   Following are the goals we discussed today:   Goals Addressed             This Visit's Progress    Care Coordination Activities       Care Coordination Interventions: Home Health Care-Patient lives in a home on his daughters property.  The family assist with care.  Patient struggles with bathing, using the toilet, and other mobility related issues.  Patient has communicated with the medical provider the need for home health and was provided a list.  It is not clear if the list has in or out of network providers.  T/c to [employer] to confirm coverage.  Patient is eligible at 100% and 20% for DME.  Staff from [place] provided family a list of in network providers via email.  [employer] also confirms OTC coverage [facility]45 at [facility] quarterly.  Family was not aware that patient was eligible for OTC benefits.  Patient can call [phone] to inquire or place an online order.  Daughter [name] agreed to call Home Health Providers to select a company and contact the doctor to inform of the one patient selects.             Our next appointment is by telephone on 03/01/23 at 3:30 pm  Please call the care guide team at [phone] if you need to cancel or reschedule your appointment.   If you are experiencing a Mental Health or Behavioral Health Crisis or need someone to talk to, please call 911  Patient verbalizes understanding of instructions and care plan provided today and agrees to view in MyChart. Active MyChart status and patient understanding of how to access instructions and care plan via MyChart confirmed with patient.     Telephone follow up appointment with care management team member scheduled for:03/01/23 at 3:30pm  Lysle Morales, BSW Social Worker [facility] Care Management  [phone]

## 2023-02-22 NOTE — Patient Outreach (Signed)
  Care Coordination   Initial Visit Note   02/22/2023 Name: [name] MRN: [id number] DOB: [birth year]  [name] is a 82 y.o. year old male who sees Cox, Kirsten, MD for primary care. I spoke with  [name] and his daughter [name] by phone today.  What matters to the patients health and wellness today?  Patient needs additional support in the home.  The family helps, but patient needs support with bathing and using the toilet, in addition to other needs due to mobility issues.      Goals Addressed             This Visit's Progress    Care Coordination Activities       Care Coordination Interventions: Home Health Care-Patient lives in a home on his daughters property.  The family assist with care.  Patient struggles with bathing, using the toilet, and other mobility related issues.  Patient has communicated with the medical provider the need for home health and was provided a list.  It is not clear if the list has in or out of network providers.  T/c to [employer] to confirm coverage.  Patient is eligible at 100% and 20% for DME.  Staff from [place] provided family a list of in network providers via email.  [employer] also confirms OTC coverage [facility] quarterly.  Family was not aware that patient was eligible for OTC benefits.  Patient can call [phone] to inquire or place an online order.  Daughter [name] agreed to call Home Health Providers to select a company and contact the doctor to inform of the one patient selects.             SDOH assessments and interventions completed:  Yes  SDOH Interventions Today    Flowsheet Row Most Recent Value  SDOH Interventions   Food Insecurity Interventions Intervention Not Indicated  Housing Interventions Intervention Not Indicated  Transportation Interventions Intervention Not Indicated, Other (Comment)  Utilities Interventions Intervention Not Indicated        Care Coordination Interventions:  Yes, provided   Interventions Today    Flowsheet Row Most Recent Value  General Interventions   General Interventions Discussed/Reviewed General Interventions Discussed, Communication with  [T/c [employer] to confirm coverage, provider list, OTC benefit]       Follow up plan: Follow up call scheduled for 03/01/23 at 3:30pm    Encounter Outcome:  Pt. Visit Completed

## 2023-02-23 ENCOUNTER — Ambulatory Visit: Payer: Self-pay

## 2023-02-23 DIAGNOSIS — E782 Mixed hyperlipidemia: Secondary | ICD-10-CM | POA: Diagnosis not present

## 2023-02-23 DIAGNOSIS — E1142 Type 2 diabetes mellitus with diabetic polyneuropathy: Secondary | ICD-10-CM | POA: Diagnosis not present

## 2023-02-23 DIAGNOSIS — E1151 Type 2 diabetes mellitus with diabetic peripheral angiopathy without gangrene: Secondary | ICD-10-CM | POA: Diagnosis not present

## 2023-02-23 DIAGNOSIS — M6281 Muscle weakness (generalized): Secondary | ICD-10-CM | POA: Diagnosis not present

## 2023-02-23 DIAGNOSIS — J189 Pneumonia, unspecified organism: Secondary | ICD-10-CM | POA: Diagnosis not present

## 2023-02-23 DIAGNOSIS — R296 Repeated falls: Secondary | ICD-10-CM | POA: Diagnosis not present

## 2023-02-23 DIAGNOSIS — J449 Chronic obstructive pulmonary disease, unspecified: Secondary | ICD-10-CM | POA: Diagnosis not present

## 2023-02-23 DIAGNOSIS — I5032 Chronic diastolic (congestive) heart failure: Secondary | ICD-10-CM | POA: Diagnosis not present

## 2023-02-23 DIAGNOSIS — J440 Chronic obstructive pulmonary disease with acute lower respiratory infection: Secondary | ICD-10-CM | POA: Diagnosis not present

## 2023-02-23 DIAGNOSIS — Z7984 Long term (current) use of oral hypoglycemic drugs: Secondary | ICD-10-CM | POA: Diagnosis not present

## 2023-02-23 DIAGNOSIS — I110 Hypertensive heart disease with heart failure: Secondary | ICD-10-CM | POA: Diagnosis not present

## 2023-02-23 DIAGNOSIS — I2510 Atherosclerotic heart disease of native coronary artery without angina pectoris: Secondary | ICD-10-CM | POA: Diagnosis not present

## 2023-02-23 NOTE — Patient Outreach (Signed)
  Care Coordination   Follow Up Visit Note   02/23/2023 Name: DEVRY SEGAR MRN: [id number] DOB: [birth year]  KRIYANSH MATLEY is a 82 y.o. year old male who sees Cox, Kirsten, MD for primary care. I spoke with  Alejandro Mulling daughter [name] by phone today.  What matters to the patients health and wellness today?  Ms. Milas Gain request clarification the difference between Home Health and [facility] Services.   SW explained the difference.  Patient is already getting PT/OT/Nurse visit.  Would not qualify for PCS because of his insurance type.   Goals Addressed   None     SDOH assessments and interventions completed:  No     Care Coordination Interventions:  Yes, provided   Interventions Today    Flowsheet Row Most Recent Value  Education Interventions   Education Provided Provided Education  Provided Verbal Education On Other  [Home Health and PCS Services]       Follow up plan: No further intervention required.   Encounter Outcome:  Pt. Visit Completed

## 2023-02-26 ENCOUNTER — Telehealth: Payer: Self-pay

## 2023-02-26 ENCOUNTER — Other Ambulatory Visit: Payer: Self-pay | Admitting: Family Medicine

## 2023-02-26 ENCOUNTER — Other Ambulatory Visit: Payer: Medicare HMO

## 2023-02-26 ENCOUNTER — Encounter: Payer: Self-pay | Admitting: Family Medicine

## 2023-02-26 MED ORDER — OFLOXACIN 0.3 % OP SOLN: 1.0000 [drp] | Freq: Four times a day (QID) | OPHTHALMIC | 0 refills | Status: DC

## 2023-02-26 NOTE — Telephone Encounter (Signed)
Patient's daughter called reporting that his eye medication helped the infection but it has not cleared.  She is requesting another medication .  She can be called at [phone].

## 2023-03-01 ENCOUNTER — Ambulatory Visit: Payer: Medicare HMO | Admitting: Family Medicine

## 2023-03-01 ENCOUNTER — Ambulatory Visit: Payer: Self-pay

## 2023-03-01 NOTE — Patient Instructions (Signed)
Visit Information  Thank you for taking time to visit with me today. Please don't hesitate to contact me if I can be of assistance to you.   Following are the goals we discussed today:  Patients daughter will anticipate phone call for personal care services for assessment.   Our next appointment is by telephone on 03/16/23 at 1pm  Please call the care guide team at [phone] if you need to cancel or reschedule your appointment.   If you are experiencing a Mental Health or Behavioral Health Crisis or need someone to talk to, please call 911  Patient verbalizes understanding of instructions and care plan provided today and agrees to view in MyChart. Active MyChart status and patient understanding of how to access instructions and care plan via MyChart confirmed with patient.     Telephone follow up appointment with care management team member scheduled for:03/16/23 1pm.  Lysle Morales, BSW Social Worker [facility] Care Management  [phone]

## 2023-03-01 NOTE — Patient Outreach (Signed)
  Care Coordination   Follow Up Visit Note   03/01/2023 Name: [name] MRN: [id number] DOB: Feb 20, 1941  [name] is a 82 y.o. year old male who sees Cox, Kirsten, MD for primary care. I spoke with  [name] daughter [name] by phone today.  What matters to the patients health and wellness today?  Patient is in need of home health services.    Goals Addressed             This Visit's Progress    Care Coordination Activities       Care Coordination Interventions: Interventions Today    Flowsheet Row Most Recent Value  Chronic Disease   Chronic disease during today's visit Diabetes, Congestive Heart Failure (CHF)  General Interventions   General Interventions Discussed/Reviewed General Interventions Discussed, General Interventions Reviewed, Level of Care  Level of Care Personal Care Services  [Daughter confirms communicate with home health agency to schedule assessment for personal care]                    SDOH assessments and interventions completed:  No     Care Coordination Interventions:  Yes, provided   Follow up plan: Follow up call scheduled for 03/16/23 1pm    Encounter Outcome:  Pt. Visit Completed

## 2023-03-09 ENCOUNTER — Other Ambulatory Visit: Payer: Self-pay | Admitting: Cardiovascular Disease

## 2023-03-12 NOTE — Progress Notes (Signed)
Primary Care: Blane Ohara, MD HF Cardiology: Dr. Shirlee Latch  HPI: Patient referred to HF clinic from transition of care clinic for evaluation of CHF.   82 y.o. male with history of CAD s/p CABG in 2003 with subsequent PCI in 2007, DM II, chronic systolic CHF. He has been followed by Dr. Elease Hashimoto in the Cardiology clinic. Echo in 6/22 showed EF 50-55%, RV okay, RVSP normal, mild to moderate MR  He was admitted on 01/24/22 with progressive weakness and dyspnea. Echo with EF < 20%, RV mildly reduced, RVSP 80 mmHg, moderate to severe MR with concern for torn chord, mild to moderate TR.  [facility] 01/26/22 showed LIMA to LAD patent, patent SVG to distal RCA, occluded SVG to diagonal, SVG to Cx is occluded, RA mean 6, PA 41/23 (30), PCWP mean 21 (v 28), PA sat 58%, Fick CO/CI 4.5/2.6, Thermo CO/CI 3.4/1.9.  No targets for intervention on LHC.  TEE 01/28/22 showed EF < 20%, moderate to severe functional MR, highly mobile echogenic mass appears to be papillary muscle, RV moderately reduced.  He was diuresed with IV lasix. Difficulty tolerating GDMT due to low blood pressures.   He was seen by Dr. Excell Seltzer for consideration of mTEER.    He was started on cardiac medications, but subsequently, spironolactone, Jardiance, and losartan were all stopped due to hypotension and orthostasis.  Patient feels like the problems really started when he began [place].   He followed up with Dr. Excell Seltzer, and it was decided not to proceed with mTEER, moderate-severe MR and unclear how much his fatigue would improve with MR treatment.   Echo in 10/23 showed EF 25-30%, moderate LV dilation, normal RV, mod-severe ischemic MR, normal IVC.   Follow up 11/23, remained weak with NYHA III symptoms. Vericiguat 2.5 mg added. Seen in ED 06/13/22 after sustaining a mechanical fall. He hit his head but did not have LOC. CT head showed no large skull fracture of ICH. Discharged home.  Follow up (1/24) with Dr. Lalla Brothers to discuss CCM. He had  improvement in symptoms by addition of vericiguat and plan will be to continue medical therapy, and avoid invasive procedures.  Zio monitor in 3/24 showed no arrhythmias to explain his falls. He was admitted in 7/24 after a fall with no loss of consciousness.  He was found to have viral PNA (rhinovirus) and ended up discharged to SNF ([facility]).   Today he returns for HF follow up with his daughter. He is still living at [facility].  He has had 1 mechanical fall since his last hospitalization.  Thinks he just loses his balance.  No lightheadedness with standing, no loss of consciousness.  He is getting PT to help with balance.  He is not doing a lot of walking, but is able to walk up the hall at the SNF without dyspnea.  He gets fatigued easily.  No chest pain.  No orthopnea/PND.  Weight is up a few pounds.  BP is stable.   ECG (personally reviewed): NSR, IVCD 128 msec  Labs (723): LDL 85 Labs (8/23): K 4.3, creatinine 1.19 Labs (11/23): K 4.3, creatinine 0.84, LDL 87 Labs (1/24): K 4, creatinine 0.84 Labs (7/24): K 3.8, creatinine 0.87, hgb 9.9  Review of Systems: All systems reviewed and negative except as per HPI.   PMH: 1. BPH 2. COPD 3. HTN 4. H/o ITP 5. Type 2 diabetes 6. Hyperlipidemia 7. CAD: S/p CABG.  - LHC 01/26/22 showed LIMA to LAD patent, patent SVG to distal RCA, occluded SVG  to diagonal, SVG to Cx is occluded 8. Chronic systolic CHF: Ischemic cardiomyopathy.  - Echo (7/23): EF < 20%, RV mildly reduced, RVSP 80 mmHg, moderate to severe MR with concern for torn chord, mild to moderate TR. - RHC (7/23):  RA mean 6, PA 41/23 (30), PCWP mean 21 (v 28), PA sat 58%, Fick CO/CI 4.5/2.6, Thermo CO/CI 3.4/1.9 - Echo (10/23): EF 25-30%, moderate LV dilation, normal RV, mod-severe ischemic MR, normal IVC.  9. Mitral regurgitation: Functional.  - TEE 01/28/22 showed EF < 20%, moderate to severe functional MR, highly mobile echogenic mass appears to be papillary muscle, RV moderately reduced.   10. Frequent falls: Zio monitor (3/24) showed no atrial fibrillation, rare PVCs/PACs.  Nothing to explain falls.   Current Outpatient Medications  Medication Sig Dispense Refill   ofloxacin (OCUFLOX) 0.3 % ophthalmic solution Place 1 drop into both eyes 4 (four) times daily. 5 mL 0   cholecalciferol (VITAMIN D3) 25 MCG (1000 UT) tablet Take 5,000 Units by mouth daily.     clopidogrel (PLAVIX) 75 MG tablet Take 1 tablet by mouth once daily 90 tablet 0   digoxin (LANOXIN) 0.125 MG tablet Take 1 tablet (0.125 mg total) by mouth daily. 90 tablet 3   ezetimibe (ZETIA) 10 MG tablet Take 1 tablet by mouth once daily 90 tablet 2   furosemide (LASIX) 20 MG tablet Take 1 tablet (20 mg total) by mouth every Monday, Wednesday, and Friday. 45 tablet 3   glucose blood (ONETOUCH ULTRA) test strip AS DIRECTED 100 each 4   Insulin Pen Needle (BD PEN NEEDLE NANO U/F) 32G X 4 MM MISC 1 each by Does not apply route daily. 100 each 3   Lancets MISC 1 each by Does not apply route daily as needed. Please provide lancets that fit patients lancet device 100 each 4   megestrol (MEGACE) 40 MG tablet Take 1 tablet (40 mg total) by mouth daily. (Patient not taking: Reported on 02/17/2023) 30 tablet 2   metFORMIN (GLUCOPHAGE) 500 MG tablet Take 1 tablet by mouth 4 times daily (Patient taking differently: Take 500 mg by mouth 3 (three) times daily.) 720 tablet 0   metoprolol succinate (TOPROL-XL) 25 MG 24 hr tablet Take 0.5 tablets (12.5 mg total) by mouth at bedtime. 30 tablet 3   Multiple Vitamin (MULTIVITAMIN) tablet Take 1 tablet by mouth daily.     nitroGLYCERIN (NITROSTAT) 0.4 MG SL tablet Place 1 tablet (0.4 mg total) under the tongue every 5 (five) minutes as needed for chest pain. 90 tablet 3   potassium chloride SA (KLOR-CON M) 20 MEQ tablet Take 1 tablet (20 mEq total) by mouth every Monday, Wednesday, and Friday. 45 tablet 3   Vericiguat (VERQUVO) 2.5 MG TABS Take 2 tablets (5 mg total) by mouth daily. 30 tablet 11    No current facility-administered medications for this visit.   Allergies  Allergen Reactions   Lipitor [Atorvastatin Calcium] Other (See Comments)    Causes memory loss   Crestor [Rosuvastatin Calcium] Other (See Comments)    Causes Muscle Pain   Zocor [Simvastatin] Other (See Comments)    Causes muscle pain   Social History   Socioeconomic History   Marital status: Married    Spouse name: Doris   Number of children: 3   Years of education: Not on file   Highest education level: High school graduate  Occupational History   Occupation: Retired   Tobacco Use   Smoking status: Never   Smokeless tobacco:  Never  Vaping Use   Vaping status: Never Used  Substance and Sexual Activity   Alcohol use: No   Drug use: No   Sexual activity: Not Currently  Other Topics Concern   Not on file  Social History Narrative   Daily caffeine use    Social Determinants of Health   Financial Resource Strain: Low Risk  (01/26/2022)   Overall Financial Resource Strain (CARDIA)    Difficulty of Paying Living Expenses: Not hard at all  Food Insecurity: No Food Insecurity (02/22/2023)   Hunger Vital Sign    Worried About Running Out of Food in the Last Year: Never true    Ran Out of Food in the Last Year: Never true  Transportation Needs: No Transportation Needs (02/22/2023)   PRAPARE - Administrator, Civil Service (Medical): No    Lack of Transportation (Non-Medical): No  Physical Activity: Sufficiently Active (08/19/2022)   Exercise Vital Sign    Days of Exercise per Week: 7 days    Minutes of Exercise per Session: 30 min  Stress: No Stress Concern Present (08/19/2022)   [employer] of Occupational Health - Occupational Stress Questionnaire    Feeling of Stress : Not at all  Social Connections: Moderately Isolated (08/19/2022)   Social Connection and Isolation Panel [NHANES]    Frequency of Communication with Friends and Family: Three times a week    Frequency of Social  Gatherings with Friends and Family: Three times a week    Attends Religious Services: More than 4 times per year    Active Member of Clubs or Organizations: No    Attends Banker Meetings: Never    Marital Status: Widowed  Intimate Partner Violence: Not At Risk (01/21/2023)   Humiliation, Afraid, Rape, and Kick questionnaire    Fear of Current or Ex-Partner: No    Emotionally Abused: No    Physically Abused: No    Sexually Abused: No   Family History  Problem Relation Age of Onset   Cerebrovascular Accident Mother    Colon cancer Neg Hx    There were no vitals taken for this visit.  Wt Readings from Last 3 Encounters:  02/17/23 67.6 kg (149 lb)  01/21/23 68 kg (149 lb 14.6 oz)  11/18/22 66.2 kg (146 lb)   PHYSICAL EXAM: General: NAD, frail Neck: No JVD, no thyromegaly or thyroid nodule.  Lungs: Clear to auscultation bilaterally with normal respiratory effort. CV: Nondisplaced PMI.  Heart regular S1/S2, no S3/S4, no murmur.  No peripheral edema.  No carotid bruit.  Normal pedal pulses.  Abdomen: Soft, nontender, no hepatosplenomegaly, no distention.  Skin: Intact without lesions or rashes.  Neurologic: Alert and oriented x 3.  Psych: Normal affect. Extremities: No clubbing or cyanosis.  HEENT: Normal.   ASSESSMENT & PLAN: 1. Chronic systolic CHF: Ischemic cardiomyopathy. Echo 7/23 showed EF < 20%, RV mildly reduced, RVSP 80 mmHg, moderate to severe MR, mild to moderate TR.  R/LHC in 7/23 showed LIMA to LAD patent, patent SVG to distal RCA, occluded SVG to diagonal, SVG to Cx is occluded, mildly elevated PCWP, Fick CO/CI 4.5/2.6, Thermo CO/CI 3.4/1.9.  No targets for intervention on LHC.  TEE in 7/23 showed EF < 20%, moderate to severe functional MR, highly mobile echogenic mass appears to be papillary muscle, RV moderately reduced. Echo in 10/23 showed EF 25-30%, moderate LV dilation, normal RV, mod-severe ischemic MR, normal IVC.  GDMT has been limited by  hypotension/orthostasis. NYHA class III symptoms,  more limited by fatigue than dyspnea.  He is not volume overloaded.  - Continue Toprol XL 12.5 mg qhs. - Continue digoxin 0.125 mg daily. Check digoxin level today. - He feels like vericiguat has helped, increase to 5 mg daily.  - BMET/BNP today.  - He seems to be intolerant of Jardiance.  - Unable to tolerate low dose spironolactone.  - I suspect he would not tolerate ARB/ACEI/ARNI.  - QRS not wide enough to be CRT candidate.  Given age, no primary prevention ICD.  - He has seen EP to discuss CCM. Plan to continue medical management and defer invasive procedures.  2. Mitral regurgitation: TEE in 7/23 suggests moderate-severe MR, likely functional.  Seen by structural heart team during 7/23 admit. It was recommended to optimize GDMT for HF and repeat echo in 2-3 months. Echo in 10/23 showed moderate-severe MR.  He saw Dr. Excell Seltzer and they decided against mTEER due to frailty and uncertainty that he would benefit significantly.  3. CAD: Hx CABG in 2003 and PCI in 2007.  LHC 07/23 with LIMA to LAD patent, patent SVG to distal RCA, occluded SVG to diagonal, SVG to Cx is occluded. No targets for intervention. No ischemic chest pain.  - Continue Plavix 75 mg daily.  - Did not tolerate statins due to myalgias and memory loss, has been on Zetia.  4. Frequent Falls: Not related to orthostatic symptoms or loss of consciousness.  Seem to be balance-related.  Zio monitor in 3/24 showed no worrisome arrhythmias.  - Continue PT for balance training.   Followup 2 months APP  Anderson [facility]  03/12/2023

## 2023-03-16 ENCOUNTER — Ambulatory Visit: Payer: Self-pay

## 2023-03-16 NOTE — Patient Outreach (Signed)
  Care Coordination   Follow Up Visit Note   03/16/2023 Name: [name] MRN: [id number] DOB: 10-19-1940  [name] is a 82 y.o. year old male who sees Cox, Kirsten, MD for primary care. I spoke with  [name] by phone today.  What matters to the patients health and wellness today?  Patient has been able to obtain in home personal care.  No additional assistance in needed.  Daughter will communicate with provider for any future needs.    Goals Addressed             This Visit's Progress    Care Coordination Activities       Interventions Today    Flowsheet Row Most Recent Value  General Interventions   General Interventions Discussed/Reviewed General Interventions Discussed, General Interventions Reviewed, Level of Care  [SW spoke to daughter and pt has a Sports coach coming to the home.  This is the long term plan.  No additional assistance is needed.]                      SDOH assessments and interventions completed:  No     Care Coordination Interventions:  Yes, provided   Follow up plan: No further intervention required.   Encounter Outcome:  Pt. Visit Completed

## 2023-03-16 NOTE — Patient Instructions (Signed)
Visit Information  Thank you for taking time to visit with me today. Please don't hesitate to contact me if I can be of assistance to you.   Following are the goals we discussed today:   Patient has achieved goal of finding in home personal care.   If you are experiencing a Mental Health or Behavioral Health Crisis or need someone to talk to, please call 911  Patient verbalizes understanding of instructions and care plan provided today and agrees to view in MyChart. Active MyChart status and patient understanding of how to access instructions and care plan via MyChart confirmed with patient.     No further follow up required: Per daughter no further intervention needed at this time. Lysle Morales, BSW Social Worker [phone]

## 2023-03-17 ENCOUNTER — Ambulatory Visit (HOSPITAL_COMMUNITY)
Admission: RE | Admit: 2023-03-17 | Discharge: 2023-03-17 | Disposition: A | Discharge status: Home / Self Care | Payer: Medicare HMO | Source: Ambulatory Visit | Attending: Family Medicine | Admitting: Family Medicine

## 2023-03-17 ENCOUNTER — Encounter (HOSPITAL_COMMUNITY): Payer: Self-pay

## 2023-03-17 VITALS — BP 122/70 | HR 77 | Wt 154.8 lb

## 2023-03-17 DIAGNOSIS — I2510 Atherosclerotic heart disease of native coronary artery without angina pectoris: Secondary | ICD-10-CM | POA: Diagnosis not present

## 2023-03-17 DIAGNOSIS — R531 Weakness: Secondary | ICD-10-CM | POA: Diagnosis not present

## 2023-03-17 DIAGNOSIS — Z79899 Other long term (current) drug therapy: Secondary | ICD-10-CM | POA: Diagnosis not present

## 2023-03-17 DIAGNOSIS — I255 Ischemic cardiomyopathy: Secondary | ICD-10-CM | POA: Diagnosis not present

## 2023-03-17 DIAGNOSIS — R54 Age-related physical debility: Secondary | ICD-10-CM | POA: Diagnosis not present

## 2023-03-17 DIAGNOSIS — E119 Type 2 diabetes mellitus without complications: Secondary | ICD-10-CM | POA: Diagnosis not present

## 2023-03-17 DIAGNOSIS — R296 Repeated falls: Secondary | ICD-10-CM | POA: Diagnosis not present

## 2023-03-17 DIAGNOSIS — I25810 Atherosclerosis of coronary artery bypass graft(s) without angina pectoris: Secondary | ICD-10-CM | POA: Insufficient documentation

## 2023-03-17 DIAGNOSIS — R413 Other amnesia: Secondary | ICD-10-CM | POA: Diagnosis not present

## 2023-03-17 DIAGNOSIS — I2584 Coronary atherosclerosis due to calcified coronary lesion: Secondary | ICD-10-CM

## 2023-03-17 DIAGNOSIS — I340 Nonrheumatic mitral (valve) insufficiency: Secondary | ICD-10-CM | POA: Diagnosis not present

## 2023-03-17 DIAGNOSIS — I110 Hypertensive heart disease with heart failure: Secondary | ICD-10-CM | POA: Insufficient documentation

## 2023-03-17 DIAGNOSIS — W19XXXA Unspecified fall, initial encounter: Secondary | ICD-10-CM | POA: Diagnosis not present

## 2023-03-17 DIAGNOSIS — Z7902 Long term (current) use of antithrombotics/antiplatelets: Secondary | ICD-10-CM | POA: Diagnosis not present

## 2023-03-17 DIAGNOSIS — I5022 Chronic systolic (congestive) heart failure: Secondary | ICD-10-CM | POA: Insufficient documentation

## 2023-03-17 DIAGNOSIS — M791 Myalgia, unspecified site: Secondary | ICD-10-CM | POA: Diagnosis not present

## 2023-03-17 DIAGNOSIS — I951 Orthostatic hypotension: Secondary | ICD-10-CM | POA: Insufficient documentation

## 2023-03-17 MED ORDER — VERQUVO 5 MG PO TABS: 5.0000 mg | ORAL_TABLET | Freq: Every day | ORAL | 8 refills | Status: DC

## 2023-03-17 NOTE — Patient Instructions (Addendum)
Thank you for coming in today  If you had labs drawn today, any labs that are abnormal the clinic will call you No news is good news  Medications: Changed tablet size of Verquvo to 5 mg 1 tablet daily   Follow up appointments:  Your physician recommends that you schedule a follow-up appointment in:  4 months With Dr. Earlean Shawl will receive a reminder letter in the mail a few months in advance. If you don't receive a letter, please call our office to schedule the follow-up appointment.    Do the following things EVERYDAY: Weigh yourself in the morning before breakfast. Write it down and keep it in a log. Take your medicines as prescribed Eat low salt foods--Limit salt (sodium) to 2000 mg per day.  Stay as active as you can everyday Limit all fluids for the day to less than 2 liters   At the [facility], you and your health needs are our priority. As part of our continuing mission to provide you with exceptional heart care, we have created designated Provider Care Teams. These Care Teams include your primary Cardiologist (physician) and Advanced Practice Providers (APPs- Physician Assistants and Nurse Practitioners) who all work together to provide you with the care you need, when you need it.   You may see any of the following providers on your designated Care Team at your next follow up: Dr Arvilla Meres Dr Marca Ancona Dr. Marcos Eke, NP Robbie Lis, [place] [facility] [place], [place] Brynda Peon, NP Karle Plumber, PharmD   Please be sure to bring in all your medications bottles to every appointment.    Thank you for choosing [facility]  If you have any questions or concerns before your next appointment please send [place] a message through [place] or call our office at [phone].    TO LEAVE A MESSAGE FOR THE NURSE SELECT OPTION 2, PLEASE LEAVE A MESSAGE INCLUDING: YOUR NAME DATE OF  BIRTH CALL BACK NUMBER REASON FOR CALL**this is important as we prioritize the call backs  YOU WILL RECEIVE A CALL BACK THE SAME DAY AS LONG AS YOU CALL BEFORE 4:00 PM

## 2023-03-23 DIAGNOSIS — Z7984 Long term (current) use of oral hypoglycemic drugs: Secondary | ICD-10-CM | POA: Diagnosis not present

## 2023-03-23 DIAGNOSIS — E119 Type 2 diabetes mellitus without complications: Secondary | ICD-10-CM | POA: Diagnosis not present

## 2023-03-23 LAB — HM DIABETES EYE EXAM

## 2023-03-29 ENCOUNTER — Other Ambulatory Visit: Payer: Self-pay

## 2023-03-29 DIAGNOSIS — E1151 Type 2 diabetes mellitus with diabetic peripheral angiopathy without gangrene: Secondary | ICD-10-CM

## 2023-03-29 MED ORDER — METFORMIN HCL 500 MG PO TABS: 500.0000 mg | ORAL_TABLET | Freq: Four times a day (QID) | ORAL | 0 refills | Status: DC

## 2023-03-30 ENCOUNTER — Telehealth: Payer: Self-pay | Admitting: Family Medicine

## 2023-03-30 NOTE — Telephone Encounter (Signed)
[facility] home health order number [id number]

## 2023-03-31 ENCOUNTER — Ambulatory Visit: Payer: Medicare HMO | Admitting: Podiatry

## 2023-03-31 DIAGNOSIS — L84 Corns and callosities: Secondary | ICD-10-CM | POA: Diagnosis not present

## 2023-03-31 DIAGNOSIS — B070 Plantar wart: Secondary | ICD-10-CM | POA: Diagnosis not present

## 2023-03-31 DIAGNOSIS — B351 Tinea unguium: Secondary | ICD-10-CM

## 2023-03-31 DIAGNOSIS — E1151 Type 2 diabetes mellitus with diabetic peripheral angiopathy without gangrene: Secondary | ICD-10-CM | POA: Diagnosis not present

## 2023-03-31 NOTE — Progress Notes (Signed)
Subjective:  Patient ID: [name], male    DOB: 10/23/1940,  MRN: [id number]  [name] presents to clinic today for:  Chief Complaint  Patient presents with   Nail Problem    Diabetic Foot Care-nail trim    Callouses    Corn to bilateral feet.    Patient notes nails are thick and elongated, causing pain in shoe gear when ambulating.  Painful corns bilateral  PCP is Cox, Fritzi Mandes, MD. date last seen was 02/17/2023.  Past Medical History:  Diagnosis Date   Acute exacerbation of CHF (congestive heart failure) (HCC) 09/29/2011   Atherosclerotic heart disease of native coronary artery without angina pectoris 02/12/2010   Qualifier: Diagnosis of  By: Alesia Richards     Benign prostatic hyperplasia with lower urinary tract symptoms 12/04/2019   CAD (coronary artery disease)    Hx of    Cellulitis 03/19/2012   Chronic obstructive pulmonary disease (HCC) 12/04/2019   Diverticulosis of colon (without mention of hemorrhage) 2011   Colonoscopy    Hiatal hernia 2011   EGD   Hypertension    Hypertensive heart disease without heart failure 12/04/2019   ITP (idiopathic thrombocytopenic purpura)    He has been diagnosed with   Pancytopenia    Thrombocytopenia, unspecified (HCC) 01/24/2013   TRANSAMINASES, SERUM, ELEVATED 02/12/2010   Qualifier: Diagnosis of  By: Alesia Richards     Type 2 diabetes mellitus with diabetic peripheral angiopathy without gangrene (HCC) 12/04/2019    Allergies  Allergen Reactions   Lipitor [Atorvastatin Calcium] Other (See Comments)    Causes memory loss   Crestor [Rosuvastatin Calcium] Other (See Comments)    Causes Muscle Pain   Zocor [Simvastatin] Other (See Comments)    Causes muscle pain    Objective:  There were no vitals filed for this visit.  [name] is a pleasant 82 y.o. male in NAD. AAO x 3.  Vascular Examination: Patient has palpable DP pulse, absent PT pulse bilateral.  Delayed capillary refill bilateral  toes.  Sparse digital hair bilateral.  Proximal to distal cooling WNL bilateral.    Dermatological Examination: Interspaces are clear with no open lesions noted bilateral.  Skin is shiny and atrophic bilateral.  Nails are 3-47mm thick, with yellowish/brown discoloration, subungual debris and distal onycholysis x10.  There is pain with compression of nails x10.  There are focal hyperkeratotic lesions noted right submet 3 and left submet 5.  There is spongy hyperkeratosis right submet 2 with black pinpoint dots within the lesion.  This has more hyperkeratosis than the other lesions.  Upon shaving there is pinpoint bleeding indicative of plantar wart.     Latest Ref Rng & Units 08/19/2022    9:22 AM 05/13/2022    8:23 AM  Hemoglobin A1C  Hemoglobin-A1c 4.8 - 5.6 % 6.2  6.1    Patient qualifies for at-risk foot care because of diabetes with PVD.  Assessment/Plan: 1. Dermatophytosis of nail   2. Type II diabetes mellitus with peripheral circulatory disorder (HCC)   3. Callus of foot    Mycotic nails x10 were sharply debrided with sterile nail nippers and power debriding burr to decrease bulk and length.  Hyperkeratotic lesions were shaved with #312 blade.  The plantar wart right submet 2 was shaved with a sterile #312 blade.  Salinocaine and a Band-Aid were applied.  He will remove this at bedtime.  Will reevaluate this on next visit and possibly treat with Cantharone solution if  we have it in stock   Return in about 3 months (around 06/30/2023) for RFC.   Clerance Lav, DPM, FACFAS [facility]     [address] [place], [place] [address]                Office [phone]  Fax [phone]

## 2023-04-12 ENCOUNTER — Telehealth: Payer: Self-pay

## 2023-04-12 NOTE — Progress Notes (Unsigned)
Acute Office Visit  Subjective:    Patient ID: [name], male    DOB: 1940-09-16, 82 y.o.   MRN: [id number]  Chief Complaint  Patient presents with   Urinary Tract Infection    HPI: The patient, accompanied by his son, presents today with a dry cough that started last week. The patient reports no fever, chills, or body aches. The son says he is just weak and that his balance is off. He also stated he has been confused more than usual and questions if he possibly has a UTI. The patient denies urinary symptoms. The patient reports shortness of breath and bilateral lower extremity swelling. The patient stated that PT "whooped" him after only six laps today at home, which is unusual for him. He also denies chest pain with exertion or rest.    Past Medical History:  Diagnosis Date   Acute exacerbation of CHF (congestive heart failure) (HCC) 09/29/2011   Atherosclerotic heart disease of native coronary artery without angina pectoris 02/12/2010   Qualifier: Diagnosis of  By: Alesia Richards     Benign prostatic hyperplasia with lower urinary tract symptoms 12/04/2019   CAD (coronary artery disease)    Hx of    Cellulitis 03/19/2012   Chronic obstructive pulmonary disease (HCC) 12/04/2019   Diverticulosis of colon (without mention of hemorrhage) 2011   Colonoscopy    Hiatal hernia 2011   EGD   Hypertension    Hypertensive heart disease without heart failure 12/04/2019   ITP (idiopathic thrombocytopenic purpura)    He has been diagnosed with   Pancytopenia    Thrombocytopenia, unspecified (HCC) 01/24/2013   TRANSAMINASES, SERUM, ELEVATED 02/12/2010   Qualifier: Diagnosis of  By: Alesia Richards     Type 2 diabetes mellitus with diabetic peripheral angiopathy without gangrene (HCC) 12/04/2019    Past Surgical History:  Procedure Laterality Date   CARDIAC CATHETERIZATION     His last heart catheterization in May of 2009 reveals a patent LIMA   CORONARY ANGIOPLASTY      Successful percutaneous transluminal coronary angioplasty of the left circumflex obuse marginal vessel   CORONARY ANGIOPLASTY WITH STENT PLACEMENT     CORONARY ARTERY BYPASS GRAFT     x4 with a left internal mammary artery anastomosis to the left anterior descending coronary artery    CORONARY STENT PLACEMENT     successful percutaneous transluminal coronary angioplasty and stenting of the left main coronary artery   HERNIA REPAIR     RIGHT/LEFT HEART CATH AND CORONARY/GRAFT ANGIOGRAPHY N/A 01/26/2022   Procedure: RIGHT/LEFT HEART CATH AND CORONARY/GRAFT ANGIOGRAPHY;  Surgeon: Lennette Bihari, MD;  Location: [facility];  Service: Cardiovascular;  Laterality: N/A;   SAPHENOUS VEIN GRAFT RESECTION     graft to the first obtuse marginal, a saphenous vein graft to the first diagonal coronary artery  and a saphenous vein graft to the distal right coronary artery   TEE WITHOUT CARDIOVERSION N/A 01/28/2022   Procedure: TRANSESOPHAGEAL ECHOCARDIOGRAM (TEE);  Surgeon: Thomasene Ripple, DO;  Location: [facility];  Service: Cardiovascular;  Laterality: N/A;   US ECHOCARDIOGRAPHY  02-08-2007   Est. EF 40-45%    Family History  Problem Relation Age of Onset   Cerebrovascular Accident Mother    Colon cancer Neg Hx     Social History   Socioeconomic History   Marital status: Married    Spouse name: Doris   Number of children: 3   Years of education: Not on file  Highest education level: High school graduate  Occupational History   Occupation: Retired   Tobacco Use   Smoking status: Never   Smokeless tobacco: Never  Vaping Use   Vaping status: Never Used  Substance and Sexual Activity   Alcohol use: No   Drug use: No   Sexual activity: Not Currently  Other Topics Concern   Not on file  Social History Narrative   Daily caffeine use    Social Determinants of Health   Financial Resource Strain: Low Risk  (01/26/2022)   Overall Financial Resource Strain (CARDIA)    Difficulty of  Paying Living Expenses: Not hard at all  Food Insecurity: No Food Insecurity (02/22/2023)   Hunger Vital Sign    Worried About Running Out of Food in the Last Year: Never true    Ran Out of Food in the Last Year: Never true  Transportation Needs: No Transportation Needs (02/22/2023)   PRAPARE - Administrator, Civil Service (Medical): No    Lack of Transportation (Non-Medical): No  Physical Activity: Sufficiently Active (08/19/2022)   Exercise Vital Sign    Days of Exercise per Week: 7 days    Minutes of Exercise per Session: 30 min  Stress: No Stress Concern Present (08/19/2022)   [employer] of Occupational Health - Occupational Stress Questionnaire    Feeling of Stress : Not at all  Social Connections: Moderately Isolated (08/19/2022)   Social Connection and Isolation Panel [NHANES]    Frequency of Communication with Friends and Family: Three times a week    Frequency of Social Gatherings with Friends and Family: Three times a week    Attends Religious Services: More than 4 times per year    Active Member of Clubs or Organizations: No    Attends Banker Meetings: Never    Marital Status: Widowed  Intimate Partner Violence: Not At Risk (01/21/2023)   Humiliation, Afraid, Rape, and Kick questionnaire    Fear of Current or Ex-Partner: No    Emotionally Abused: No    Physically Abused: No    Sexually Abused: No    Outpatient Medications Prior to Visit  Medication Sig Dispense Refill   cholecalciferol (VITAMIN D3) 25 MCG (1000 UT) tablet Take 5,000 Units by mouth daily.     clopidogrel (PLAVIX) 75 MG tablet Take 1 tablet by mouth once daily 90 tablet 0   digoxin (LANOXIN) 0.125 MG tablet Take 1 tablet (0.125 mg total) by mouth daily. 90 tablet 3   ezetimibe (ZETIA) 10 MG tablet Take 1 tablet by mouth once daily 90 tablet 2   glucose blood (ONETOUCH ULTRA) test strip AS DIRECTED 100 each 4   Insulin Pen Needle (BD PEN NEEDLE NANO U/F) 32G X 4 MM MISC 1 each by  Does not apply route daily. 100 each 3   Lancets MISC 1 each by Does not apply route daily as needed. Please provide lancets that fit patients lancet device 100 each 4   metFORMIN (GLUCOPHAGE) 500 MG tablet Take 1 tablet (500 mg total) by mouth 4 (four) times daily. 720 tablet 0   metoprolol succinate (TOPROL-XL) 25 MG 24 hr tablet Take 0.5 tablets (12.5 mg total) by mouth at bedtime. 30 tablet 3   Multiple Vitamin (MULTIVITAMIN) tablet Take 1 tablet by mouth daily.     nitroGLYCERIN (NITROSTAT) 0.4 MG SL tablet Place 1 tablet (0.4 mg total) under the tongue every 5 (five) minutes as needed for chest pain. 90 tablet 3  ofloxacin (OCUFLOX) 0.3 % ophthalmic solution Place 1 drop into both eyes 4 (four) times daily. (Patient taking differently: Place 1 drop into both eyes 4 (four) times daily. As needed) 5 mL 0   potassium chloride SA (KLOR-CON M) 20 MEQ tablet Take 1 tablet (20 mEq total) by mouth every Monday, Wednesday, and Friday. 45 tablet 3   Vericiguat (VERQUVO) 2.5 MG TABS Take 2 tablets (5 mg total) by mouth daily. 30 tablet 11   Vericiguat (VERQUVO) 5 MG TABS Take 1 tablet (5 mg total) by mouth daily. 30 tablet 8   furosemide (LASIX) 20 MG tablet Take 1 tablet (20 mg total) by mouth every Monday, Wednesday, and Friday. 45 tablet 3   megestrol (MEGACE) 40 MG tablet Take 1 tablet (40 mg total) by mouth daily. 30 tablet 2   No facility-administered medications prior to visit.    Allergies  Allergen Reactions   Lipitor [Atorvastatin Calcium] Other (See Comments)    Causes memory loss   Crestor [Rosuvastatin Calcium] Other (See Comments)    Causes Muscle Pain   Zocor [Simvastatin] Other (See Comments)    Causes muscle pain    Review of Systems  Constitutional:  Positive for fatigue. Negative for chills and fever.  HENT:  Negative for congestion, ear pain and sore throat.   Respiratory:  Positive for cough and shortness of breath.   Cardiovascular:  Positive for leg swelling. Negative  for chest pain.  Gastrointestinal:  Negative for abdominal pain and nausea.  Genitourinary:  Negative for dysuria, flank pain, frequency and urgency.  Musculoskeletal:  Negative for back pain.  Psychiatric/Behavioral:  Positive for confusion.                Objective:        04/13/2023    1:27 PM 03/17/2023    8:53 AM 02/17/2023    1:38 PM  Vitals with BMI  Height 5\' 9"   5\' 9"   Weight 157 lbs 154 lbs 13 oz 149 lbs  BMI 23.17  21.99  Systolic 124 122 045  Diastolic 70 70 74  Pulse 86 77 78    No data found.   Physical Exam Vitals reviewed.  Constitutional:      General: He is not in acute distress.    Appearance: Normal appearance. He is normal weight.  HENT:     Right Ear: Tympanic membrane normal.     Left Ear: There is impacted cerumen.     Nose:     Right Turbinates: Pale.     Left Turbinates: Pale.     Right Sinus: No maxillary sinus tenderness or frontal sinus tenderness.     Left Sinus: No maxillary sinus tenderness or frontal sinus tenderness.     Mouth/Throat:     Mouth: Mucous membranes are moist.     Pharynx: No posterior oropharyngeal erythema.  Cardiovascular:     Rate and Rhythm: Normal rate and regular rhythm.     Heart sounds: Murmur heard.  Pulmonary:     Effort: Pulmonary effort is normal.     Breath sounds: Decreased air movement present. Decreased breath sounds present.  Musculoskeletal:     Right lower leg: 2+ Edema present.     Left lower leg: 2+ Edema present.  Neurological:     Mental Status: He is alert and oriented to person, place, and time.  Psychiatric:        Mood and Affect: Mood normal.        Behavior: Behavior  normal.     Health Maintenance Due  Topic Date Due   Zoster Vaccines- Shingrix (1 of 2) Never done   FOOT EXAM  02/04/2023   INFLUENZA VACCINE  02/11/2023   HEMOGLOBIN A1C  02/17/2023   COVID-19 Vaccine (5 - 2023-24 season) 03/14/2023    There are no preventive care reminders to display for this  patient.   Lab Results  Component Value Date   TSH 2.684 01/24/2022   Lab Results  Component Value Date   WBC 7.3 02/17/2023   HGB 11.6 (L) 02/17/2023   HCT 34.0 (L) 02/17/2023   MCV 97 02/17/2023   PLT 179 02/17/2023   Lab Results  Component Value Date   NA 137 02/17/2023   K 4.7 02/17/2023   CHLORIDE 103 07/14/2016   CO2 21 02/17/2023   GLUCOSE 147 (H) 02/17/2023   BUN 14 02/17/2023   CREATININE 0.92 02/17/2023   BILITOT 0.6 02/17/2023   ALKPHOS 54 02/17/2023   AST 16 02/17/2023   ALT 12 02/17/2023   PROT 6.9 02/17/2023   ALBUMIN 4.1 02/17/2023   CALCIUM 9.6 02/17/2023   ANIONGAP 9 02/03/2023   EGFR 82 02/17/2023   Lab Results  Component Value Date   CHOL 142 08/19/2022   Lab Results  Component Value Date   HDL 55 08/19/2022   Lab Results  Component Value Date   LDLCALC 74 08/19/2022   Lab Results  Component Value Date   TRIG 61 08/19/2022   Lab Results  Component Value Date   CHOLHDL 2.6 08/19/2022   Lab Results  Component Value Date   HGBA1C 6.2 (H) 08/19/2022       Assessment & Plan:  Shortness of breath Assessment & Plan: Await labs/testing for assessment and recommendations.  Update Labs: Probnp 6360. Cmp and cbc per verbal report were normal.  CXR showed trace bilateral pleural effusions and bilateral atelectasis.  I have instructed the patient to increase his furosemide by 20 mg a day for the next week.  So on Monday, Wednesday, Friday he will take Lasix 20 mg in am and at lunch. On Tuesday, Thursday, Saturday, and Sunday he will take lasix 20 mg in am.  FU on Monday Oct. 7th  Orders: -     CBC with Differential/Platelet -     Comprehensive metabolic panel -     Pro b natriuretic peptide (BNP)  Acute cough Assessment & Plan: Tessalon pearls 200 mg by mouth TIC as needed for cough FU on Monday  Orders: -     DG Chest 2 View; Future -     Benzonatate; Take 1 capsule (200 mg total) by mouth 3 (three) times daily as needed for  cough.  Dispense: 30 capsule; Refill: 0  Mental confusion Assessment & Plan: Await labs/testing for assessment and recommendations.   Orders: -     UA/M w/rflx Culture, Comp     Meds ordered this encounter  Medications   benzonatate (TESSALON) 200 MG capsule    Sig: Take 1 capsule (200 mg total) by mouth 3 (three) times daily as needed for cough.    Dispense:  30 capsule    Refill:  0    Orders Placed This Encounter  Procedures   DG Chest 2 View   UA/M w/rflx Culture, Comp   CBC with Differential   Comprehensive metabolic panel   Pro b natriuretic peptide     Follow-up: No follow-ups on file.  An After Visit Summary was printed and given to  the patient.  Renne Crigler, FNP [facility] [phone]

## 2023-04-12 NOTE — Telephone Encounter (Signed)
Corrie Dandy from [facility] called concerning patient stating that she went out to see him today and stated that she had noticed that since last Thursday the patient had a 5lb weight gain and stated that she was concerned because they are supposed to report that if the patient had a weight gain and the daughter stated that the CHF clinic stated she didn't need to report it if it wasn't more than 3lbs over one day. But she states that she needed to report this per her Guidelines from [place] [facility]. This has been documented and patient is schedule to come in for an appointment tomorrow too as well.

## 2023-04-13 ENCOUNTER — Other Ambulatory Visit: Payer: Self-pay | Admitting: Family Medicine

## 2023-04-13 ENCOUNTER — Encounter: Payer: Self-pay | Admitting: Family Medicine

## 2023-04-13 ENCOUNTER — Telehealth: Payer: Self-pay

## 2023-04-13 ENCOUNTER — Ambulatory Visit (INDEPENDENT_AMBULATORY_CARE_PROVIDER_SITE_OTHER): Payer: Medicare HMO | Admitting: Family Medicine

## 2023-04-13 ENCOUNTER — Telehealth: Payer: Self-pay | Admitting: Family Medicine

## 2023-04-13 VITALS — BP 124/70 | HR 86 | Temp 97.1°F | Ht 69.0 in | Wt 157.0 lb

## 2023-04-13 DIAGNOSIS — J40 Bronchitis, not specified as acute or chronic: Secondary | ICD-10-CM | POA: Diagnosis not present

## 2023-04-13 DIAGNOSIS — R410 Disorientation, unspecified: Secondary | ICD-10-CM | POA: Insufficient documentation

## 2023-04-13 DIAGNOSIS — J9811 Atelectasis: Secondary | ICD-10-CM | POA: Diagnosis not present

## 2023-04-13 DIAGNOSIS — R0602 Shortness of breath: Secondary | ICD-10-CM | POA: Diagnosis not present

## 2023-04-13 DIAGNOSIS — R051 Acute cough: Secondary | ICD-10-CM | POA: Diagnosis not present

## 2023-04-13 DIAGNOSIS — R059 Cough, unspecified: Secondary | ICD-10-CM | POA: Diagnosis not present

## 2023-04-13 DIAGNOSIS — J90 Pleural effusion, not elsewhere classified: Secondary | ICD-10-CM | POA: Diagnosis not present

## 2023-04-13 DIAGNOSIS — I5022 Chronic systolic (congestive) heart failure: Secondary | ICD-10-CM

## 2023-04-13 MED ORDER — FUROSEMIDE 20 MG PO TABS: ORAL_TABLET | ORAL | 1 refills | Status: DC

## 2023-04-13 MED ORDER — BENZONATATE 200 MG PO CAPS
200.0000 mg | ORAL_CAPSULE | Freq: Three times a day (TID) | ORAL | 0 refills | Status: DC | PRN
Start: 2023-04-13 — End: 2023-04-22

## 2023-04-13 NOTE — Assessment & Plan Note (Addendum)
Await labs/testing for assessment and recommendations.  Update Labs: Probnp 6360. Cmp and cbc per verbal report were normal.  CXR showed trace bilateral pleural effusions and bilateral atelectasis.  I have instructed the patient to increase his furosemide by 20 mg a day for the next week.  So on Monday, Wednesday, Friday he will take Lasix 20 mg in am and at lunch. On Tuesday, Thursday, Saturday, and Sunday he will take lasix 20 mg in am.  FU on Monday Oct. 7th

## 2023-04-13 NOTE — Telephone Encounter (Signed)
I left a message with [name], [name] son.  I then spoke with his daughter [name].  I have instructed her to increase his furosemide BY 20 mg a day for the next week.  So on Monday, Wednesday, Friday he will take Lasix 20 mg in am and at Klein. On Tuesday, Thursday, Saturday, and Sunday he will take lasix 20 mg in am.  He should take kcl 20 meq with each lasix.  If she would like me to send a smaller form of potassium, she may call back and I will send microkcl 10 meq.   Labs: Probnp 6360. Cmp and cbc per verbal report were normal.   CXR showed trace BL pleural effusions and BL atelectasis. I did not have this report when I spoke with his children. This will need to be called in the morning.   Patient to schedule appointment on Monday October 7th with Lajuana Matte or myself.   Dr. Sedalia Muta

## 2023-04-13 NOTE — Assessment & Plan Note (Signed)
Tessalon pearls 200 mg by mouth TIC as needed for cough Increase lasix to 20 mg daily and potassium chloride 20 mEq daily with lasix FU on Monday

## 2023-04-13 NOTE — Telephone Encounter (Signed)
[facility] order #: [id number]

## 2023-04-13 NOTE — Assessment & Plan Note (Signed)
Await labs/testing for assessment and recommendations. ?

## 2023-04-14 ENCOUNTER — Encounter: Payer: Self-pay | Admitting: Family Medicine

## 2023-04-19 DIAGNOSIS — Z7984 Long term (current) use of oral hypoglycemic drugs: Secondary | ICD-10-CM

## 2023-04-19 DIAGNOSIS — R296 Repeated falls: Secondary | ICD-10-CM

## 2023-04-19 DIAGNOSIS — N401 Enlarged prostate with lower urinary tract symptoms: Secondary | ICD-10-CM

## 2023-04-19 DIAGNOSIS — E782 Mixed hyperlipidemia: Secondary | ICD-10-CM

## 2023-04-19 DIAGNOSIS — J449 Chronic obstructive pulmonary disease, unspecified: Secondary | ICD-10-CM

## 2023-04-19 DIAGNOSIS — M6281 Muscle weakness (generalized): Secondary | ICD-10-CM

## 2023-04-19 DIAGNOSIS — I2510 Atherosclerotic heart disease of native coronary artery without angina pectoris: Secondary | ICD-10-CM

## 2023-04-19 DIAGNOSIS — E1151 Type 2 diabetes mellitus with diabetic peripheral angiopathy without gangrene: Secondary | ICD-10-CM

## 2023-04-19 DIAGNOSIS — I110 Hypertensive heart disease with heart failure: Secondary | ICD-10-CM

## 2023-04-19 DIAGNOSIS — Z7901 Long term (current) use of anticoagulants: Secondary | ICD-10-CM

## 2023-04-19 DIAGNOSIS — E1142 Type 2 diabetes mellitus with diabetic polyneuropathy: Secondary | ICD-10-CM

## 2023-04-19 DIAGNOSIS — I5032 Chronic diastolic (congestive) heart failure: Secondary | ICD-10-CM

## 2023-04-20 ENCOUNTER — Ambulatory Visit (INDEPENDENT_AMBULATORY_CARE_PROVIDER_SITE_OTHER): Payer: Medicare HMO | Admitting: Family Medicine

## 2023-04-20 ENCOUNTER — Encounter: Payer: Self-pay | Admitting: Family Medicine

## 2023-04-20 VITALS — BP 122/78 | HR 117 | Temp 98.0°F | Resp 18 | Ht 69.0 in | Wt 160.0 lb

## 2023-04-20 DIAGNOSIS — R051 Acute cough: Secondary | ICD-10-CM

## 2023-04-20 DIAGNOSIS — R0609 Other forms of dyspnea: Secondary | ICD-10-CM | POA: Diagnosis not present

## 2023-04-20 DIAGNOSIS — R3911 Hesitancy of micturition: Secondary | ICD-10-CM | POA: Diagnosis not present

## 2023-04-20 DIAGNOSIS — I5089 Other heart failure: Secondary | ICD-10-CM | POA: Diagnosis not present

## 2023-04-20 DIAGNOSIS — J029 Acute pharyngitis, unspecified: Secondary | ICD-10-CM

## 2023-04-20 DIAGNOSIS — I110 Hypertensive heart disease with heart failure: Secondary | ICD-10-CM

## 2023-04-20 DIAGNOSIS — J9811 Atelectasis: Secondary | ICD-10-CM

## 2023-04-20 LAB — POCT URINALYSIS DIP (CLINITEK)
Bilirubin, UA: NEGATIVE
Blood, UA: NEGATIVE
Glucose, UA: NEGATIVE mg/dL
Ketones, POC UA: NEGATIVE mg/dL
Leukocytes, UA: NEGATIVE
Nitrite, UA: NEGATIVE
POC PROTEIN,UA: NEGATIVE
Spec Grav, UA: 1.01 (ref 1.010–1.025)
Urobilinogen, UA: 0.2 U/dL
pH, UA: 6.5 (ref 5.0–8.0)

## 2023-04-20 LAB — POC COVID19 BINAXNOW: SARS Coronavirus 2 Ag: NEGATIVE

## 2023-04-20 LAB — POCT INFLUENZA A/B: Influenza A, POC: NEGATIVE

## 2023-04-20 LAB — POCT RAPID STREP A (OFFICE): Rapid Strep A Screen: NEGATIVE

## 2023-04-20 MED ORDER — GUAIFENESIN ER 600 MG PO TB12
600.0000 mg | ORAL_TABLET | Freq: Once | ORAL | 0 refills | Status: DC
Start: 2023-04-20 — End: 2023-05-04

## 2023-04-20 NOTE — Patient Instructions (Signed)
DeBrox ear drops to loosen wax  Mucinex

## 2023-04-20 NOTE — Progress Notes (Signed)
Subjective:  Patient ID: [name], male    DOB: March 25, 1941  Age: 82 y.o. MRN: [id number]  Chief Complaint  Patient presents with   Shortness of Breath    Follow-up   Urinary Tract Infection    Follow-up    HPI Hypertensive heart disease with CHF: Patient had increased shortness of breath last week and bilateral lower extremity swelling. Labs: Probnp 6360. Dr. Sedalia Muta instructed that patient to take an extra Lasix 20 mg per day. So on Monday, Wednesday, Friday he will take Lasix 20 mg in am and at lunch. On Tuesday, Thursday, Saturday, and Sunday he will take lasix 20 mg in am. He should take kcl 20 meq with each lasix. Patient still winded and stated that he just feels bad and experiencing dyspnea on exertion. Denies chest pain and his lower extremity swelling is much better.   Urinary tract infection: Patient has no symptoms of dysuria, urgency or frequency. He states that until they started the lasix that he was hardly going. He was unable to void at the office last week or at the hospital.  Patient brought a urine sample today.   Atelectasis of bilateral lungs: Chest xray showed trace BL pleural effusions and BL atelectasis. Patient reports that he felt better this morning until he had to walk so far this morning to the office.   Sore throat: Patient reports that he throat hurts some. He has not fever, chills, or night sweats. Patient reports that he hasn't taken anything for his throat. Patient does report decreased appetite and just overall not feeling good. He did receive the Flu and Covid vaccine on the same day (9.20.24) and his caretaker was sick before that. He says this is when all these symptoms started.       02/17/2023    1:40 PM 09/11/2022   11:12 AM 08/19/2022    8:25 AM 05/13/2022    1:02 PM 05/13/2021    8:13 AM  Depression screen PHQ 2/9  Decreased Interest 0 0 0 0 0  Down, Depressed, Hopeless 0 0 0 0 0  PHQ - 2 Score 0 0 0 0 0        02/22/2023    4:29 PM  Fall  Risk   Falls in the past year? 1  Number falls in past yr: 1  Comment At least 8  Injury with Fall? 1  Comment Injury to face (cut)  Risk for fall due to : Impaired balance/gait;Impaired mobility    Patient Care Team: Blane Ohara, MD as PCP - General (Family Medicine) Lanier Prude, MD as PCP - Electrophysiology (Cardiology) Dalbert Garnet as Social Worker   Review of Systems  Constitutional:  Positive for appetite change and fatigue. Negative for fever.  HENT:  Positive for sore throat. Negative for ear pain.   Respiratory:  Positive for cough and shortness of breath.   Cardiovascular:  Negative for chest pain and leg swelling.  Gastrointestinal:  Negative for diarrhea, nausea and vomiting.  Genitourinary:  Negative for dysuria, frequency and urgency.  Musculoskeletal:  Negative for back pain and myalgias.    Current Outpatient Medications on File Prior to Visit  Medication Sig Dispense Refill   benzonatate (TESSALON) 200 MG capsule Take 1 capsule (200 mg total) by mouth 3 (three) times daily as needed for cough. 30 capsule 0   cholecalciferol (VITAMIN D3) 25 MCG (1000 UT) tablet Take 5,000 Units by mouth daily.     clopidogrel (PLAVIX) 75 MG  tablet Take 1 tablet by mouth once daily 90 tablet 0   digoxin (LANOXIN) 0.125 MG tablet Take 1 tablet (0.125 mg total) by mouth daily. 90 tablet 3   ezetimibe (ZETIA) 10 MG tablet Take 1 tablet by mouth once daily 90 tablet 2   furosemide (LASIX) 20 MG tablet One in am and one before lunch. 60 tablet 1   glucose blood (ONETOUCH ULTRA) test strip AS DIRECTED 100 each 4   Insulin Pen Needle (BD PEN NEEDLE NANO U/F) 32G X 4 MM MISC 1 each by Does not apply route daily. 100 each 3   Lancets MISC 1 each by Does not apply route daily as needed. Please provide lancets that fit patients lancet device 100 each 4   metFORMIN (GLUCOPHAGE) 500 MG tablet Take 1 tablet (500 mg total) by mouth 4 (four) times daily. 720 tablet 0   metoprolol  succinate (TOPROL-XL) 25 MG 24 hr tablet Take 0.5 tablets (12.5 mg total) by mouth at bedtime. 30 tablet 3   Multiple Vitamin (MULTIVITAMIN) tablet Take 1 tablet by mouth daily.     nitroGLYCERIN (NITROSTAT) 0.4 MG SL tablet Place 1 tablet (0.4 mg total) under the tongue every 5 (five) minutes as needed for chest pain. 90 tablet 3   ofloxacin (OCUFLOX) 0.3 % ophthalmic solution Place 1 drop into both eyes 4 (four) times daily. (Patient taking differently: Place 1 drop into both eyes 4 (four) times daily. As needed) 5 mL 0   potassium chloride SA (KLOR-CON M) 20 MEQ tablet Take 1 tablet (20 mEq total) by mouth every Monday, Wednesday, and Friday. 45 tablet 3   Vericiguat (VERQUVO) 2.5 MG TABS Take 2 tablets (5 mg total) by mouth daily. 30 tablet 11   Vericiguat (VERQUVO) 5 MG TABS Take 1 tablet (5 mg total) by mouth daily. 30 tablet 8   No current facility-administered medications on file prior to visit.   Past Medical History:  Diagnosis Date   Acute exacerbation of CHF (congestive heart failure) (HCC) 09/29/2011   Atherosclerotic heart disease of native coronary artery without angina pectoris 02/12/2010   Qualifier: Diagnosis of  By: Alesia Richards     Benign prostatic hyperplasia with lower urinary tract symptoms 12/04/2019   CAD (coronary artery disease)    Hx of    Cellulitis 03/19/2012   Chronic obstructive pulmonary disease (HCC) 12/04/2019   Diverticulosis of colon (without mention of hemorrhage) 2011   Colonoscopy    Hiatal hernia 2011   EGD   Hypertension    Hypertensive heart disease without heart failure 12/04/2019   ITP (idiopathic thrombocytopenic purpura)    He has been diagnosed with   Pancytopenia    Thrombocytopenia, unspecified (HCC) 01/24/2013   TRANSAMINASES, SERUM, ELEVATED 02/12/2010   Qualifier: Diagnosis of  By: Alesia Richards     Type 2 diabetes mellitus with diabetic peripheral angiopathy without gangrene (HCC) 12/04/2019   Past Surgical History:   Procedure Laterality Date   CARDIAC CATHETERIZATION     His last heart catheterization in May of 2009 reveals a patent LIMA   CORONARY ANGIOPLASTY     Successful percutaneous transluminal coronary angioplasty of the left circumflex obuse marginal vessel   CORONARY ANGIOPLASTY WITH STENT PLACEMENT     CORONARY ARTERY BYPASS GRAFT     x4 with a left internal mammary artery anastomosis to the left anterior descending coronary artery    CORONARY STENT PLACEMENT     successful percutaneous transluminal coronary angioplasty and stenting of  the left main coronary artery   HERNIA REPAIR     RIGHT/LEFT HEART CATH AND CORONARY/GRAFT ANGIOGRAPHY N/A 01/26/2022   Procedure: RIGHT/LEFT HEART CATH AND CORONARY/GRAFT ANGIOGRAPHY;  Surgeon: Lennette Bihari, MD;  Location: [facility];  Service: Cardiovascular;  Laterality: N/A;   SAPHENOUS VEIN GRAFT RESECTION     graft to the first obtuse marginal, a saphenous vein graft to the first diagonal coronary artery  and a saphenous vein graft to the distal right coronary artery   TEE WITHOUT CARDIOVERSION N/A 01/28/2022   Procedure: TRANSESOPHAGEAL ECHOCARDIOGRAM (TEE);  Surgeon: Thomasene Ripple, DO;  Location: [facility];  Service: Cardiovascular;  Laterality: N/A;   US ECHOCARDIOGRAPHY  02-08-2007   Est. EF 40-45%    Family History  Problem Relation Age of Onset   Cerebrovascular Accident Mother    Colon cancer Neg Hx    Social History   Socioeconomic History   Marital status: Married    Spouse name: Doris   Number of children: 3   Years of education: Not on file   Highest education level: High school graduate  Occupational History   Occupation: Retired   Tobacco Use   Smoking status: Never   Smokeless tobacco: Never  Vaping Use   Vaping status: Never Used  Substance and Sexual Activity   Alcohol use: No   Drug use: No   Sexual activity: Not Currently  Other Topics Concern   Not on file  Social History Narrative   Daily caffeine use     Social Determinants of Health   Financial Resource Strain: Low Risk  (01/26/2022)   Overall Financial Resource Strain (CARDIA)    Difficulty of Paying Living Expenses: Not hard at all  Food Insecurity: No Food Insecurity (02/22/2023)   Hunger Vital Sign    Worried About Running Out of Food in the Last Year: Never true    Ran Out of Food in the Last Year: Never true  Transportation Needs: No Transportation Needs (02/22/2023)   PRAPARE - Administrator, Civil Service (Medical): No    Lack of Transportation (Non-Medical): No  Physical Activity: Sufficiently Active (08/19/2022)   Exercise Vital Sign    Days of Exercise per Week: 7 days    Minutes of Exercise per Session: 30 min  Stress: No Stress Concern Present (08/19/2022)   [employer] of Occupational Health - Occupational Stress Questionnaire    Feeling of Stress : Not at all  Social Connections: Moderately Isolated (08/19/2022)   Social Connection and Isolation Panel [NHANES]    Frequency of Communication with Friends and Family: Three times a week    Frequency of Social Gatherings with Friends and Family: Three times a week    Attends Religious Services: More than 4 times per year    Active Member of Clubs or Organizations: No    Attends Banker Meetings: Never    Marital Status: Widowed    Objective:  BP 122/78   Pulse (!) 117   Temp 98 F (36.7 C)   Resp 18   Ht 5\' 9"  (1.753 m)   Wt 160 lb (72.6 kg)   SpO2 94%   BMI 23.63 kg/m      04/20/2023    9:14 AM 04/13/2023    1:27 PM 03/17/2023    8:53 AM  BP/Weight  Systolic BP 122 124 122  Diastolic BP 78 70 70  Wt. (Lbs) 160 157 154.8  BMI 23.63 kg/m2 23.18 kg/m2 22.86 kg/m2  Physical Exam Constitutional:      General: He is not in acute distress.    Appearance: Normal appearance. He is ill-appearing.  HENT:     Right Ear: Tympanic membrane normal.     Left Ear: There is impacted cerumen.     Nose:     Right Turbinates: Pale.      Left Turbinates: Pale.     Right Sinus: No maxillary sinus tenderness or frontal sinus tenderness.     Left Sinus: No maxillary sinus tenderness or frontal sinus tenderness.     Mouth/Throat:     Mouth: Mucous membranes are moist.  Cardiovascular:     Rate and Rhythm: Normal rate and regular rhythm.     Heart sounds: Normal heart sounds.  Pulmonary:     Effort: Pulmonary effort is normal.     Breath sounds: Normal breath sounds and air entry.     Comments:   Abdominal:     General: Bowel sounds are normal.     Palpations: Abdomen is soft.     Tenderness: There is no abdominal tenderness.  Skin:    General: Skin is warm.  Neurological:     General: No focal deficit present.     Mental Status: He is alert. Mental status is at baseline.  Psychiatric:        Mood and Affect: Mood normal.        Behavior: Behavior normal.     Lab Results  Component Value Date   WBC 7.3 02/17/2023   HGB 11.6 (L) 02/17/2023   HCT 34.0 (L) 02/17/2023   PLT 179 02/17/2023   GLUCOSE 147 (H) 02/17/2023   CHOL 142 08/19/2022   TRIG 61 08/19/2022   HDL 55 08/19/2022   LDLCALC 74 08/19/2022   ALT 12 02/17/2023   AST 16 02/17/2023   NA 137 02/17/2023   K 4.7 02/17/2023   CL 100 02/17/2023   CREATININE 0.92 02/17/2023   BUN 14 02/17/2023   CO2 21 02/17/2023   TSH 2.684 01/24/2022   INR 1.3 (H) 03/24/2011   HGBA1C 6.2 (H) 08/19/2022   MICROALBUR 10 05/13/2021      Assessment & Plan:    Urinary hesitancy Assessment & Plan: UA negative Continue to drink fluids as tolerated   Orders: -     POCT URINALYSIS DIP (CLINITEK)  Dyspnea on exertion Assessment & Plan: Labs drawn today Await labs/testing for assessment and recommendations. Based on results we will continue to increase his Lasix 20 mg   Atelectasis of both lungs Assessment & Plan: Labs drawn today Flutter valve ordered Await labs/testing for assessment and recommendations.     Hypertensive heart disease with other  congestive heart failure (HCC) Assessment & Plan: Severe. EF 20%. Moderate MR.  Management per specialist On vericiquat 2.5 mg, toprol xl 25 mg daily, and digoxin 0.125 mg daily. Lasix and potassium ordered last week   Continue to work on eating a healthy diet and exercise.  Labs drawn today.   Orders: -     Comprehensive metabolic panel -     Pro b natriuretic peptide (BNP)  Sore throat Assessment & Plan: Strep negative Gargle with warm salt water  Orders: -     POCT rapid strep A  Acute cough Assessment & Plan: Not improving Continue safe tussin, tessalon pearls as needed for cough Guaifenesin 600 mg by mouth once daily  Continue to drink plenty of water on this medication  Orders: -     guaiFENesin  ER; Take 1 tablet (600 mg total) by mouth once for 1 dose.  Dispense: 1 tablet; Refill: 0 -     POC COVID-19 BinaxNow -     POCT Influenza A/B     Meds ordered this encounter  Medications   guaiFENesin (MUCINEX) 600 MG 12 hr tablet    Sig: Take 1 tablet (600 mg total) by mouth once for 1 dose.    Dispense:  1 tablet    Refill:  0    Orders Placed This Encounter  Procedures   Comprehensive metabolic panel   Pro b natriuretic peptide   POCT URINALYSIS DIP (CLINITEK)   Rapid Strep A   POC COVID-19   Influenza A/B     Follow-up: Return if symptoms worsen or fail to improve.   Madelynn Done Smith,acting as a Neurosurgeon for Renne Crigler, FNP.,have documented all relevant documentation on the behalf of Renne Crigler, FNP,as directed by  Renne Crigler, FNP while in the presence of Renne Crigler, FNP.  Total time spent on today's visit was greater than 30 minutes, including both face-to-face time and non face-to-face time personally spent on review of chart (labs and imaging), discussing labs and goals, discussing further work-up, treatment options, referrals to specialist if needed, reviewing outside records of pertinent, answering patient's questions, and coordinating  care.   An After Visit Summary was printed and given to the patient.   Lajuana Matte, FNP [facility] [phone]

## 2023-04-20 NOTE — Assessment & Plan Note (Addendum)
UA negative Continue to drink fluids as tolerated

## 2023-04-20 NOTE — Assessment & Plan Note (Signed)
Severe. EF 20%. Moderate MR.  Management per specialist On vericiquat 2.5 mg, toprol xl 25 mg daily, and digoxin 0.125 mg daily. Lasix and potassium ordered last week   Continue to work on eating a healthy diet and exercise.  Labs drawn today.

## 2023-04-20 NOTE — Assessment & Plan Note (Addendum)
Strep - negative Gargle with warm salt water

## 2023-04-20 NOTE — Assessment & Plan Note (Signed)
Not improving Continue safe tussin, tessalon pearls as needed for cough Guaifenesin 600 mg by mouth once daily  Continue to drink plenty of water on this medication

## 2023-04-20 NOTE — Assessment & Plan Note (Signed)
Labs drawn today Await labs/testing for assessment and recommendations. Based on results we will continue to increase his Lasix 20 mg

## 2023-04-20 NOTE — Assessment & Plan Note (Signed)
Labs drawn today Flutter valve ordered Await labs/testing for assessment and recommendations.

## 2023-04-21 LAB — COMPREHENSIVE METABOLIC PANEL
ALT: 18 [IU]/L (ref 0–44)
AST: 21 [IU]/L (ref 0–40)
Albumin: 3.6 g/dL — ABNORMAL LOW (ref 3.7–4.7)
Alkaline Phosphatase: 60 [IU]/L (ref 44–121)
BUN/Creatinine Ratio: 12 (ref 10–24)
BUN: 12 mg/dL (ref 8–27)
Bilirubin Total: 0.9 mg/dL (ref 0.0–1.2)
CO2: 22 mmol/L (ref 20–29)
Calcium: 9.4 mg/dL (ref 8.6–10.2)
Chloride: 97 mmol/L (ref 96–106)
Creatinine, Ser: 1.03 mg/dL (ref 0.76–1.27)
Globulin, Total: 3.2 g/dL (ref 1.5–4.5)
Glucose: 226 mg/dL — ABNORMAL HIGH (ref 70–99)
Potassium: 4.9 mmol/L (ref 3.5–5.2)
Sodium: 134 mmol/L (ref 134–144)
Total Protein: 6.8 g/dL (ref 6.0–8.5)
eGFR: 72 mL/min/{1.73_m2} (ref >59)

## 2023-04-21 LAB — PRO B NATRIURETIC PEPTIDE: NT-Pro BNP: 5629 pg/mL — ABNORMAL HIGH (ref 0–486)

## 2023-04-22 ENCOUNTER — Other Ambulatory Visit: Payer: Self-pay

## 2023-04-22 DIAGNOSIS — R051 Acute cough: Secondary | ICD-10-CM

## 2023-04-22 DIAGNOSIS — I5022 Chronic systolic (congestive) heart failure: Secondary | ICD-10-CM

## 2023-04-22 MED ORDER — FUROSEMIDE 40 MG PO TABS: ORAL_TABLET | ORAL | 2 refills | Status: DC

## 2023-04-22 MED ORDER — BENZONATATE 200 MG PO CAPS: 200.0000 mg | ORAL_CAPSULE | Freq: Three times a day (TID) | ORAL | 0 refills | Status: DC | PRN

## 2023-04-25 ENCOUNTER — Encounter (HOSPITAL_COMMUNITY): Payer: Self-pay | Admitting: Emergency Medicine

## 2023-04-25 ENCOUNTER — Inpatient Hospital Stay (HOSPITAL_COMMUNITY)
Admission: EM | Admit: 2023-04-25 | Discharge: 2023-05-04 | DRG: 291 | Disposition: A | Discharge status: Skilled Nursing Facility | Payer: Medicare HMO | Attending: Family Medicine | Admitting: Family Medicine

## 2023-04-25 ENCOUNTER — Emergency Department (HOSPITAL_COMMUNITY): Payer: Medicare HMO

## 2023-04-25 ENCOUNTER — Other Ambulatory Visit: Payer: Self-pay

## 2023-04-25 DIAGNOSIS — R54 Age-related physical debility: Secondary | ICD-10-CM | POA: Diagnosis present

## 2023-04-25 DIAGNOSIS — E871 Hypo-osmolality and hyponatremia: Secondary | ICD-10-CM | POA: Diagnosis present

## 2023-04-25 DIAGNOSIS — I480 Paroxysmal atrial fibrillation: Secondary | ICD-10-CM | POA: Diagnosis present

## 2023-04-25 DIAGNOSIS — E1151 Type 2 diabetes mellitus with diabetic peripheral angiopathy without gangrene: Secondary | ICD-10-CM | POA: Diagnosis present

## 2023-04-25 DIAGNOSIS — I272 Pulmonary hypertension, unspecified: Secondary | ICD-10-CM | POA: Diagnosis present

## 2023-04-25 DIAGNOSIS — Z823 Family history of stroke: Secondary | ICD-10-CM

## 2023-04-25 DIAGNOSIS — I110 Hypertensive heart disease with heart failure: Principal | ICD-10-CM | POA: Diagnosis present

## 2023-04-25 DIAGNOSIS — Z743 Need for continuous supervision: Secondary | ICD-10-CM | POA: Diagnosis not present

## 2023-04-25 DIAGNOSIS — Z515 Encounter for palliative care: Secondary | ICD-10-CM

## 2023-04-25 DIAGNOSIS — I255 Ischemic cardiomyopathy: Secondary | ICD-10-CM | POA: Diagnosis not present

## 2023-04-25 DIAGNOSIS — R296 Repeated falls: Secondary | ICD-10-CM | POA: Diagnosis not present

## 2023-04-25 DIAGNOSIS — I493 Ventricular premature depolarization: Secondary | ICD-10-CM | POA: Diagnosis present

## 2023-04-25 DIAGNOSIS — Z7401 Bed confinement status: Secondary | ICD-10-CM | POA: Diagnosis not present

## 2023-04-25 DIAGNOSIS — E440 Moderate protein-calorie malnutrition: Secondary | ICD-10-CM | POA: Diagnosis present

## 2023-04-25 DIAGNOSIS — I5023 Acute on chronic systolic (congestive) heart failure: Secondary | ICD-10-CM | POA: Diagnosis present

## 2023-04-25 DIAGNOSIS — R509 Fever, unspecified: Secondary | ICD-10-CM | POA: Insufficient documentation

## 2023-04-25 DIAGNOSIS — I4719 Other supraventricular tachycardia: Secondary | ICD-10-CM | POA: Diagnosis not present

## 2023-04-25 DIAGNOSIS — D638 Anemia in other chronic diseases classified elsewhere: Secondary | ICD-10-CM | POA: Diagnosis not present

## 2023-04-25 DIAGNOSIS — Z66 Do not resuscitate: Secondary | ICD-10-CM | POA: Diagnosis present

## 2023-04-25 DIAGNOSIS — I2510 Atherosclerotic heart disease of native coronary artery without angina pectoris: Secondary | ICD-10-CM | POA: Diagnosis present

## 2023-04-25 DIAGNOSIS — Z955 Presence of coronary angioplasty implant and graft: Secondary | ICD-10-CM

## 2023-04-25 DIAGNOSIS — Z7984 Long term (current) use of oral hypoglycemic drugs: Secondary | ICD-10-CM

## 2023-04-25 DIAGNOSIS — Z79899 Other long term (current) drug therapy: Secondary | ICD-10-CM

## 2023-04-25 DIAGNOSIS — D649 Anemia, unspecified: Secondary | ICD-10-CM | POA: Insufficient documentation

## 2023-04-25 DIAGNOSIS — R0602 Shortness of breath: Secondary | ICD-10-CM | POA: Diagnosis not present

## 2023-04-25 DIAGNOSIS — N179 Acute kidney failure, unspecified: Secondary | ICD-10-CM | POA: Diagnosis not present

## 2023-04-25 DIAGNOSIS — E785 Hyperlipidemia, unspecified: Secondary | ICD-10-CM | POA: Diagnosis not present

## 2023-04-25 DIAGNOSIS — Z751 Person awaiting admission to adequate facility elsewhere: Secondary | ICD-10-CM

## 2023-04-25 DIAGNOSIS — I509 Heart failure, unspecified: Secondary | ICD-10-CM | POA: Diagnosis not present

## 2023-04-25 DIAGNOSIS — J9601 Acute respiratory failure with hypoxia: Secondary | ICD-10-CM | POA: Diagnosis not present

## 2023-04-25 DIAGNOSIS — E8770 Fluid overload, unspecified: Secondary | ICD-10-CM | POA: Diagnosis not present

## 2023-04-25 DIAGNOSIS — R9431 Abnormal electrocardiogram [ECG] [EKG]: Secondary | ICD-10-CM | POA: Insufficient documentation

## 2023-04-25 DIAGNOSIS — T380X5A Adverse effect of glucocorticoids and synthetic analogues, initial encounter: Secondary | ICD-10-CM | POA: Diagnosis not present

## 2023-04-25 DIAGNOSIS — W44F3XA Food entering into or through a natural orifice, initial encounter: Secondary | ICD-10-CM | POA: Diagnosis not present

## 2023-04-25 DIAGNOSIS — N401 Enlarged prostate with lower urinary tract symptoms: Secondary | ICD-10-CM | POA: Diagnosis present

## 2023-04-25 DIAGNOSIS — Z1152 Encounter for screening for COVID-19: Secondary | ICD-10-CM | POA: Diagnosis not present

## 2023-04-25 DIAGNOSIS — E1165 Type 2 diabetes mellitus with hyperglycemia: Secondary | ICD-10-CM | POA: Diagnosis not present

## 2023-04-25 DIAGNOSIS — T17928A Food in respiratory tract, part unspecified causing other injury, initial encounter: Secondary | ICD-10-CM | POA: Diagnosis not present

## 2023-04-25 DIAGNOSIS — W19XXXA Unspecified fall, initial encounter: Secondary | ICD-10-CM | POA: Diagnosis not present

## 2023-04-25 DIAGNOSIS — R059 Cough, unspecified: Secondary | ICD-10-CM | POA: Diagnosis not present

## 2023-04-25 DIAGNOSIS — I4891 Unspecified atrial fibrillation: Secondary | ICD-10-CM

## 2023-04-25 DIAGNOSIS — Y92019 Unspecified place in single-family (private) house as the place of occurrence of the external cause: Secondary | ICD-10-CM | POA: Diagnosis not present

## 2023-04-25 DIAGNOSIS — Z951 Presence of aortocoronary bypass graft: Secondary | ICD-10-CM | POA: Diagnosis not present

## 2023-04-25 DIAGNOSIS — Z7189 Other specified counseling: Secondary | ICD-10-CM | POA: Diagnosis not present

## 2023-04-25 DIAGNOSIS — R410 Disorientation, unspecified: Secondary | ICD-10-CM | POA: Diagnosis not present

## 2023-04-25 DIAGNOSIS — J441 Chronic obstructive pulmonary disease with (acute) exacerbation: Secondary | ICD-10-CM | POA: Diagnosis present

## 2023-04-25 DIAGNOSIS — J90 Pleural effusion, not elsewhere classified: Secondary | ICD-10-CM | POA: Diagnosis not present

## 2023-04-25 DIAGNOSIS — I059 Rheumatic mitral valve disease, unspecified: Secondary | ICD-10-CM | POA: Diagnosis not present

## 2023-04-25 DIAGNOSIS — Z888 Allergy status to other drugs, medicaments and biological substances status: Secondary | ICD-10-CM

## 2023-04-25 DIAGNOSIS — E119 Type 2 diabetes mellitus without complications: Secondary | ICD-10-CM | POA: Diagnosis not present

## 2023-04-25 DIAGNOSIS — Z7902 Long term (current) use of antithrombotics/antiplatelets: Secondary | ICD-10-CM

## 2023-04-25 DIAGNOSIS — Z6821 Body mass index (BMI) 21.0-21.9, adult: Secondary | ICD-10-CM

## 2023-04-25 DIAGNOSIS — R062 Wheezing: Secondary | ICD-10-CM | POA: Diagnosis not present

## 2023-04-25 DIAGNOSIS — I700 Atherosclerosis of aorta: Secondary | ICD-10-CM | POA: Diagnosis not present

## 2023-04-25 DIAGNOSIS — R000 Tachycardia, unspecified: Secondary | ICD-10-CM | POA: Diagnosis not present

## 2023-04-25 DIAGNOSIS — R001 Bradycardia, unspecified: Secondary | ICD-10-CM | POA: Diagnosis not present

## 2023-04-25 DIAGNOSIS — I340 Nonrheumatic mitral (valve) insufficiency: Secondary | ICD-10-CM | POA: Diagnosis not present

## 2023-04-25 HISTORY — DX: Supraventricular tachycardia, unspecified: I47.10

## 2023-04-25 HISTORY — DX: Atrial premature depolarization: I49.1

## 2023-04-25 HISTORY — DX: Nonrheumatic mitral (valve) insufficiency: I34.0

## 2023-04-25 HISTORY — DX: Chronic systolic (congestive) heart failure: I50.22

## 2023-04-25 HISTORY — DX: Ventricular premature depolarization: I49.3

## 2023-04-25 HISTORY — DX: Other supraventricular tachycardia: I47.19

## 2023-04-25 HISTORY — DX: Other ventricular tachycardia: I47.29

## 2023-04-25 HISTORY — DX: Nonrheumatic aortic (valve) insufficiency: I35.1

## 2023-04-25 LAB — CBC WITH DIFFERENTIAL/PLATELET
Abs Immature Granulocytes: 0.12 10*3/uL — ABNORMAL HIGH (ref 0.00–0.07)
Basophils Absolute: 0 10*3/uL (ref 0.0–0.1)
Basophils Relative: 0 %
Eosinophils Absolute: 0 10*3/uL (ref 0.0–0.5)
Eosinophils Relative: 0 %
HCT: 30 % — ABNORMAL LOW (ref 39.0–52.0)
Hemoglobin: 9.7 g/dL — ABNORMAL LOW (ref 13.0–17.0)
Immature Granulocytes: 1 %
Lymphocytes Relative: 6 %
Lymphs Abs: 0.7 10*3/uL (ref 0.7–4.0)
MCH: 31.3 pg (ref 26.0–34.0)
MCHC: 32.3 g/dL (ref 30.0–36.0)
MCV: 96.8 fL (ref 80.0–100.0)
Monocytes Absolute: 1.1 10*3/uL — ABNORMAL HIGH (ref 0.1–1.0)
Monocytes Relative: 9 %
Neutro Abs: 10.1 10*3/uL — ABNORMAL HIGH (ref 1.7–7.7)
Neutrophils Relative %: 84 %
Platelets: 253 10*3/uL (ref 150–400)
RBC: 3.1 MIL/uL — ABNORMAL LOW (ref 4.22–5.81)
RDW: 13.8 % (ref 11.5–15.5)
WBC: 12 10*3/uL — ABNORMAL HIGH (ref 4.0–10.5)
nRBC: 0 % (ref 0.0–0.2)

## 2023-04-25 LAB — COMPREHENSIVE METABOLIC PANEL
ALT: 39 U/L (ref 0–44)
AST: 43 U/L — ABNORMAL HIGH (ref 15–41)
Albumin: 2.9 g/dL — ABNORMAL LOW (ref 3.5–5.0)
Alkaline Phosphatase: 56 U/L (ref 38–126)
Anion gap: 13 (ref 5–15)
BUN: 24 mg/dL — ABNORMAL HIGH (ref 8–23)
CO2: 19 mmol/L — ABNORMAL LOW (ref 22–32)
Calcium: 9.1 mg/dL (ref 8.9–10.3)
Chloride: 100 mmol/L (ref 98–111)
Creatinine, Ser: 1.25 mg/dL — ABNORMAL HIGH (ref 0.61–1.24)
GFR, Estimated: 57 mL/min — ABNORMAL LOW (ref >60)
Glucose, Bld: 244 mg/dL — ABNORMAL HIGH (ref 70–99)
Potassium: 5 mmol/L (ref 3.5–5.1)
Sodium: 132 mmol/L — ABNORMAL LOW (ref 135–145)
Total Bilirubin: 1.9 mg/dL — ABNORMAL HIGH (ref 0.3–1.2)
Total Protein: 6.9 g/dL (ref 6.5–8.1)

## 2023-04-25 LAB — I-STAT ARTERIAL BLOOD GAS, ED
Acid-base deficit: 5 mmol/L — ABNORMAL HIGH (ref 0.0–2.0)
Bicarbonate: 17.6 mmol/L — ABNORMAL LOW (ref 20.0–28.0)
Calcium, Ion: 1.18 mmol/L (ref 1.15–1.40)
HCT: 29 % — ABNORMAL LOW (ref 39.0–52.0)
Hemoglobin: 9.9 g/dL — ABNORMAL LOW (ref 13.0–17.0)
O2 Saturation: 98 %
Patient temperature: 99.1
Potassium: 4.9 mmol/L (ref 3.5–5.1)
Sodium: 130 mmol/L — ABNORMAL LOW (ref 135–145)
TCO2: 18 mmol/L — ABNORMAL LOW (ref 22–32)
pCO2 arterial: 24.1 mm[Hg] — ABNORMAL LOW (ref 32–48)
pH, Arterial: 7.472 — ABNORMAL HIGH (ref 7.35–7.45)
pO2, Arterial: 104 mm[Hg] (ref 83–108)

## 2023-04-25 LAB — RESPIRATORY PANEL BY PCR

## 2023-04-25 LAB — HEMOGLOBIN A1C
Hgb A1c MFr Bld: 6.5 % — ABNORMAL HIGH (ref 4.8–5.6)
Mean Plasma Glucose: 139.85 mg/dL

## 2023-04-25 LAB — TROPONIN I (HIGH SENSITIVITY)
Troponin I (High Sensitivity): 43 ng/L — ABNORMAL HIGH (ref <18)
Troponin I (High Sensitivity): 33 ng/L — ABNORMAL HIGH (ref <18)

## 2023-04-25 LAB — RESP PANEL BY RT-PCR (RSV, FLU A&B, COVID)  RVPGX2
Influenza A by PCR: NEGATIVE
Influenza B by PCR: NEGATIVE
Resp Syncytial Virus by PCR: NEGATIVE
SARS Coronavirus 2 by RT PCR: NEGATIVE

## 2023-04-25 LAB — BRAIN NATRIURETIC PEPTIDE: B Natriuretic Peptide: 1878.8 pg/mL — ABNORMAL HIGH (ref 0.0–100.0)

## 2023-04-25 LAB — LACTIC ACID, PLASMA
Lactic Acid, Venous: 2.6 mmol/L (ref 0.5–1.9)
Lactic Acid, Venous: 1.4 mmol/L (ref 0.5–1.9)

## 2023-04-25 LAB — DIGOXIN LEVEL: Digoxin Level: 1 ng/mL (ref 0.8–2.0)

## 2023-04-25 LAB — TSH: TSH: 2.444 u[IU]/mL (ref 0.350–4.500)

## 2023-04-25 MED ORDER — CLOPIDOGREL BISULFATE 75 MG PO TABS
75.0000 mg | ORAL_TABLET | Freq: Every day | ORAL | Status: DC
  Administered 2023-04-26 – 2023-05-04 (×9): 75 mg via ORAL
  Filled 2023-04-25 (×9): qty 1

## 2023-04-25 MED ORDER — ACETAMINOPHEN 325 MG PO TABS
650.0000 mg | ORAL_TABLET | Freq: Four times a day (QID) | ORAL | Status: DC | PRN
  Administered 2023-04-25 – 2023-05-04 (×4): 650 mg via ORAL
  Filled 2023-04-25 (×4): qty 2

## 2023-04-25 MED ORDER — BENZONATATE 100 MG PO CAPS
200.0000 mg | ORAL_CAPSULE | Freq: Three times a day (TID) | ORAL | Status: DC | PRN
  Administered 2023-04-25: 200 mg via ORAL
  Filled 2023-04-25: qty 2

## 2023-04-25 MED ORDER — ACETAMINOPHEN 650 MG RE SUPP
650.0000 mg | Freq: Four times a day (QID) | RECTAL | Status: DC | PRN
  Administered 2023-05-01: 650 mg via RECTAL
  Filled 2023-04-25: qty 1

## 2023-04-25 MED ORDER — VERICIGUAT 5 MG PO TABS: 5.0000 mg | ORAL_TABLET | Freq: Every day | ORAL | Status: DC

## 2023-04-25 MED ORDER — FUROSEMIDE 10 MG/ML IJ SOLN
40.0000 mg | Freq: Once | INTRAMUSCULAR | Status: AC
  Administered 2023-04-25: 40 mg via INTRAVENOUS
  Filled 2023-04-25: qty 4

## 2023-04-25 MED ORDER — METOPROLOL SUCCINATE ER 25 MG PO TB24
12.5000 mg | ORAL_TABLET | Freq: Every day | ORAL | Status: DC
  Administered 2023-04-25 – 2023-05-04 (×10): 12.5 mg via ORAL
  Filled 2023-04-25 (×10): qty 1

## 2023-04-25 MED ORDER — OLOPATADINE HCL 0.1 % OP SOLN
1.0000 [drp] | Freq: Two times a day (BID) | OPHTHALMIC | Status: DC
  Administered 2023-04-26 – 2023-05-04 (×17): 1 [drp] via OPHTHALMIC
  Filled 2023-04-25: qty 5

## 2023-04-25 MED ORDER — AZITHROMYCIN 250 MG PO TABS
500.0000 mg | ORAL_TABLET | Freq: Every day | ORAL | Status: AC
  Administered 2023-04-25 – 2023-04-27 (×3): 500 mg via ORAL
  Filled 2023-04-25 (×5): qty 2

## 2023-04-25 MED ORDER — ENOXAPARIN SODIUM 40 MG/0.4ML IJ SOSY
40.0000 mg | PREFILLED_SYRINGE | INTRAMUSCULAR | Status: DC
  Administered 2023-04-25 – 2023-05-03 (×9): 40 mg via SUBCUTANEOUS
  Filled 2023-04-25 (×9): qty 0.4

## 2023-04-25 MED ORDER — PREDNISONE 20 MG PO TABS
40.0000 mg | ORAL_TABLET | Freq: Every day | ORAL | Status: DC
  Administered 2023-04-26 – 2023-04-27 (×2): 40 mg via ORAL
  Filled 2023-04-25 (×2): qty 2

## 2023-04-25 MED ORDER — DIGOXIN 125 MCG PO TABS
0.1250 mg | ORAL_TABLET | Freq: Every day | ORAL | Status: DC
  Administered 2023-04-26 – 2023-05-04 (×9): 0.125 mg via ORAL
  Filled 2023-04-25 (×9): qty 1

## 2023-04-25 MED ORDER — EZETIMIBE 10 MG PO TABS
10.0000 mg | ORAL_TABLET | Freq: Every day | ORAL | Status: DC
  Administered 2023-04-26 – 2023-04-30 (×5): 10 mg via ORAL
  Filled 2023-04-25 (×5): qty 1

## 2023-04-25 MED ORDER — LEVALBUTEROL HCL 1.25 MG/0.5ML IN NEBU
1.2500 mg | INHALATION_SOLUTION | Freq: Once | RESPIRATORY_TRACT | Status: AC
  Administered 2023-04-25: 1.25 mg via RESPIRATORY_TRACT
  Filled 2023-04-25: qty 0.5

## 2023-04-25 MED ORDER — SODIUM CHLORIDE 0.9 % IV SOLN: 500.0000 mg | INTRAVENOUS | Status: DC

## 2023-04-25 NOTE — ED Notes (Signed)
When assessing patient, I asked if he had a pacemaker, to which he advised that he did. No documentation in chart supported this, and physical exam revealed no evidence of one. When I told him I did not see one, he advised he did not have one. He was also confused regarding location and current date.

## 2023-04-25 NOTE — Assessment & Plan Note (Signed)
Per chart review, this is a new problem in 2023 in which she presented with acute

## 2023-04-25 NOTE — Assessment & Plan Note (Addendum)
Dyspnea and orthopnea with peripheral edema and bibasilar crackles on exam and BNP 1878. Most recent echo in Oct 2023 with EF 25-30%. Failed outpatient diuresis with PO lasix, though was only taking 20mg  BID vs prescribed 40mg  BID from PCP note on 10/1. S/p 1x Lasix 40mg  IV in the ED. Heart failure team agreed with plan, will place formal consult in AM. --admit to FMTS, med-tele, Dr Lum Babe attending --repeat Lasix 40mg  IV x1 --continue home digoxin, metoprolol --repeat Echo -- Risk stratification labs: TSH, A1c  --AM BMP --strict I/Os, daily weights --continuous telemetry given history of Afib with RVR --heart failure consult in AM

## 2023-04-25 NOTE — ED Provider Notes (Signed)
[place] [facility]GENCY DEPARTMENT AT [facility] Provider Note   CSN: [id number] Arrival date & time: 04/25/23  1133     History  Chief Complaint  Patient presents with   Shortness of Breath    [name] is a 82 y.o. male.  The history is provided by the patient and medical records. No language interpreter was used.  Shortness of Breath Severity:  Severe Onset quality:  Gradual Duration:  2 weeks Timing:  Constant Progression:  Worsening Chronicity:  New Context: URI   Relieved by:  Nothing Worsened by:  Coughing Ineffective treatments:  None tried Associated symptoms: cough and sputum production   Associated symptoms: no abdominal pain, no chest pain, no ear pain, no fever, no headaches, no hemoptysis, no neck pain, no rash, no sore throat, no vomiting and no wheezing   Risk factors: no hx of PE/DVT        Home Medications Prior to Admission medications   Medication Sig Start Date End Date Taking? Authorizing Provider  benzonatate (TESSALON) 200 MG capsule Take 1 capsule (200 mg total) by mouth 3 (three) times daily as needed for cough. 04/22/23   Renne Crigler, FNP  cholecalciferol (VITAMIN D3) 25 MCG (1000 UT) tablet Take 5,000 Units by mouth daily.    [provider]  clopidogrel (PLAVIX) 75 MG tablet Take 1 tablet by mouth once daily 01/25/23   Nahser, Deloris Ping, MD  digoxin (LANOXIN) 0.125 MG tablet Take 1 tablet (0.125 mg total) by mouth daily. 05/29/22   Laurey Morale, MD  ezetimibe (ZETIA) 10 MG tablet Take 1 tablet by mouth once daily 03/09/23   Nahser, Deloris Ping, MD  furosemide (LASIX) 40 MG tablet One in am and one before lunch. 04/22/23   Renne Crigler, FNP  glucose blood [facility] ULTRA) test strip AS DIRECTED 01/07/23   CoxFritzi Mandes, MD  guaiFENesin (MUCINEX) 600 MG 12 hr tablet Take 1 tablet (600 mg total) by mouth once for 1 dose. 04/20/23 04/20/23  Renne Crigler, FNP  Insulin Pen Needle (BD PEN NEEDLE NANO U/F) 32G X 4 MM MISC 1  each by Does not apply route daily. 03/03/22   [name], MD  Lancets MISC 1 each by Does not apply route daily as needed. Please provide lancets that fit patients lancet device 10/09/19   [name], MD  metFORMIN (GLUCOPHAGE) 500 MG tablet Take 1 tablet (500 mg total) by mouth 4 (four) times daily. 03/29/23   CoxFritzi Mandes, MD  metoprolol succinate (TOPROL-XL) 25 MG 24 hr tablet Take 0.5 tablets (12.5 mg total) by mouth at bedtime. 08/21/22   Jacklynn Ganong, FNP  Multiple Vitamin (MULTIVITAMIN) tablet Take 1 tablet by mouth daily.    [provider]  nitroGLYCERIN (NITROSTAT) 0.4 MG SL tablet Place 1 tablet (0.4 mg total) under the tongue every 5 (five) minutes as needed for chest pain. 02/02/22 05/30/23  Nahser, Deloris Ping, MD  ofloxacin (OCUFLOX) 0.3 % ophthalmic solution Place 1 drop into both eyes 4 (four) times daily. Patient taking differently: Place 1 drop into both eyes 4 (four) times daily. As needed 02/26/23   CoxFritzi Mandes, MD  potassium chloride SA (KLOR-CON M) 20 MEQ tablet Take 1 tablet (20 mEq total) by mouth every Monday, Wednesday, and Friday. 08/24/22   [place], Anderson Malta, FNP  Vericiguat (VERQUVO) 2.5 MG TABS Take 2 tablets (5 mg total) by mouth daily. 02/03/23   Laurey Morale, MD  Vericiguat [facility]) 5  MG TABS Take 1 tablet (5 mg total) by mouth daily. 03/17/23   [place], Anderson Malta, FNP      Allergies    Lipitor [atorvastatin calcium], Crestor [rosuvastatin calcium], and Zocor [simvastatin]    Review of Systems   Review of Systems  Constitutional:  Positive for chills and fatigue. Negative for fever.  HENT:  Negative for congestion, ear pain and sore throat.   Eyes:  Negative for pain and visual disturbance.  Respiratory:  Positive for cough, sputum production, chest tightness and shortness of breath. Negative for hemoptysis, wheezing and stridor.   Cardiovascular:  Positive for leg swelling (chronic and unchantged per pt). Negative for chest  pain and palpitations.  Gastrointestinal:  Negative for abdominal pain, constipation, diarrhea, nausea and vomiting.  Genitourinary:  Negative for dysuria and hematuria.  Musculoskeletal:  Negative for arthralgias, back pain and neck pain.  Skin:  Negative for color change and rash.  Neurological:  Negative for seizures, syncope, light-headedness and headaches.  Psychiatric/Behavioral:  Negative for agitation.   All other systems reviewed and are negative.   Physical Exam Updated Vital Signs BP 124/76   Pulse (!) 115   Temp 99.1 F (37.3 C) (Rectal)   Resp (!) 28   Ht 5\' 9"  (1.753 m)   Wt 72.6 kg   SpO2 97%   BMI 23.63 kg/m  Physical Exam Vitals and nursing note reviewed.  Constitutional:      General: He is not in acute distress.    Appearance: He is well-developed. He is ill-appearing. He is not toxic-appearing or diaphoretic.  HENT:     Head: Normocephalic and atraumatic.     Mouth/Throat:     Mouth: Mucous membranes are moist.  Eyes:     Conjunctiva/sclera: Conjunctivae normal.     Pupils: Pupils are equal, round, and reactive to light.  Cardiovascular:     Rate and Rhythm: Regular rhythm. Tachycardia present.     Heart sounds: No murmur heard. Pulmonary:     Effort: Tachypnea present. No respiratory distress.     Breath sounds: No stridor. Rhonchi and rales present. No wheezing.  Chest:     Chest wall: No tenderness.  Abdominal:     Palpations: Abdomen is soft.     Tenderness: There is no abdominal tenderness.  Musculoskeletal:        General: No swelling.     Cervical back: Neck supple.     Right lower leg: No tenderness. Edema present.     Left lower leg: No tenderness. Edema present.  Skin:    General: Skin is warm and dry.     Capillary Refill: Capillary refill takes less than 2 seconds.     Findings: No erythema.  Neurological:     General: No focal deficit present.     Mental Status: He is alert.  Psychiatric:        Mood and Affect: Mood normal.      ED Results / Procedures / Treatments   Labs (all labs ordered are listed, but only abnormal results are displayed) Labs Reviewed  CBC WITH DIFFERENTIAL/PLATELET - Abnormal; Notable for the following components:      Result Value   WBC 12.0 (*)    RBC 3.10 (*)    Hemoglobin 9.7 (*)    HCT 30.0 (*)    Neutro Abs 10.1 (*)    Monocytes Absolute 1.1 (*)    Abs Immature Granulocytes 0.12 (*)    All other components within normal limits  COMPREHENSIVE METABOLIC PANEL - Abnormal; Notable for the following components:   Sodium 132 (*)    CO2 19 (*)    Glucose, Bld 244 (*)    BUN 24 (*)    Creatinine, Ser 1.25 (*)    Albumin 2.9 (*)    AST 43 (*)    Total Bilirubin 1.9 (*)    GFR, Estimated 57 (*)    All other components within normal limits  BRAIN NATRIURETIC PEPTIDE - Abnormal; Notable for the following components:   B Natriuretic Peptide 1,878.8 (*)    All other components within normal limits  LACTIC ACID, PLASMA - Abnormal; Notable for the following components:   Lactic Acid, Venous 2.6 (*)    All other components within normal limits  I-STAT ARTERIAL BLOOD GAS, ED - Abnormal; Notable for the following components:   pH, Arterial 7.472 (*)    pCO2 arterial 24.1 (*)    Bicarbonate 17.6 (*)    TCO2 18 (*)    Acid-base deficit 5.0 (*)    Sodium 130 (*)    HCT 29.0 (*)    Hemoglobin 9.9 (*)    All other components within normal limits  TROPONIN I (HIGH SENSITIVITY) - Abnormal; Notable for the following components:   Troponin I (High Sensitivity) 43 (*)    All other components within normal limits  RESP PANEL BY RT-PCR (RSV, FLU A&B, COVID)  RVPGX2  RESPIRATORY PANEL BY PCR  LACTIC ACID, PLASMA  BLOOD GAS, ARTERIAL  DIGOXIN LEVEL  TROPONIN I (HIGH SENSITIVITY)    EKG EKG Interpretation Date/Time:  Sunday April 25 2023 11:40:51 EDT Ventricular Rate:  113 PR Interval:  171 QRS Duration:  139 QT Interval:  320 QTC Calculation: 439 R Axis:   -40  Text  Interpretation: Sinus or ectopic atrial tachycardia Multiple ventricular premature complexes RBBB and LAFB Nonspecific T abnormalities, lateral leads when compared to prior, faster rate and more PVC No STEMI Confirmed by Theda Belfast ([id number]) on 04/25/2023 11:45:02 AM  Radiology DG Chest Portable 1 View  Result Date: 04/25/2023 CLINICAL DATA:  82 year old male with shortness of breath. EXAM: PORTABLE CHEST 1 VIEW COMPARISON:  Chest radiographs 04/13/2023 and earlier. FINDINGS: Portable AP upright view at 1220 hours. Small pleural effusions are less apparent. Stable CABG, mediastinal contours. Stable pulmonary vascularity, no overt edema. No pneumothorax or consolidation. No acute osseous abnormality identified. Negative visible bowel gas. IMPRESSION: Small pleural effusions on 04/13/2023 are less apparent. No new cardiopulmonary abnormality. Electronically Signed   By: Odessa Fleming M.D.   On: 04/25/2023 13:29    Procedures Procedures    CRITICAL CARE Performed by: Canary Brim Geisha Abernathy Total critical care time: 20 minutes Critical care time was exclusive of separately billable procedures and treating other patients. Critical care was necessary to treat or prevent imminent or life-threatening deterioration. Critical care was time spent personally by me on the following activities: development of treatment plan with patient and/or surrogate as well as nursing, discussions with consultants, evaluation of patient's response to treatment, examination of patient, obtaining history from patient or surrogate, ordering and performing treatments and interventions, ordering and review of laboratory studies, ordering and review of radiographic studies, pulse oximetry and re-evaluation of patient's condition.  Medications Ordered in ED Medications  furosemide (LASIX) injection 40 mg (40 mg Intravenous Given 04/25/23 1425)    ED Course/ Medical Decision Making/ A&P  Medical  Decision Making Amount and/or Complexity of Data Reviewed Labs: ordered. Radiology: ordered.  Risk Prescription drug management. Decision regarding hospitalization.    SERGEI DELO is a 82 y.o. male with a past medical history significant for CAD status post PCI, CHF with previous EF of 25 to 30%, prior ITP, diabetes, diverticulosis and hiatal hernia who presents with cough, fatigue, and shortness of breath.  According to patient, for the last few weeks he has had worsening shortness of breath and cough with some mild phlegm.  No hemoptysis reported.  Denies any chest pain but reports his breathing was worsened to where he needed help.  He said he was feeling more fatigued and tired.  He denied any leg pain or worsened leg swelling.  He reports some chronic leg swelling that is not different.  Denies any fevers but may have had some chills.  Denies any nausea, vomiting, constipation, or diarrhea.  On exam, patient is warm to the touch.  Lungs have rales and rhonchi bilaterally.  No wheezing initially.  Chest and abdomen nontender.  Good pulses in extremities.  Very minimal edema in the legs.  Patient on 2 L nasal cannula due to oxygen saturation being around 90 with EMS and he reports he checked it at home and it was in the 80s recently.  EKG does not show STEMI.  Clinically I am concerned about pneumonia versus COVID versus CHF exacerbation given the patient's presentation.  He will remain on oxygen and we will get workup started.  Anticipate admission given his new oxygen requirement and his ill appearance.  His rectal temp was not elevated but he does have some tachycardia and tachypnea.  Will wait for labs to return but anticipate antibiotics if there is concern for pneumonia and COVID test negative.  1:55 PM Workup continues to return.  Patient's BNP is much more elevated at 1878 up from 900 several months ago.  Patient does have a leukocytosis and mild anemia both worse than prior.   Metabolic panel shows mild changes from prior.   COVID flu and RSV negative.  Lactic acid elevated but will trend.  Troponin elevated at 43 nearly what it was in the past.  X-ray does not show pneumonia, shows improved effusions.  I am clinically concerned about a viral URI on top of fluid overload leading to his shortness of breath and hypoxia.  Will call for admission for diuresis and discuss if they feel that he needs antibiotics or not.        Final Clinical Impression(s) / ED Diagnoses Final diagnoses:  SOB (shortness of breath)  Hypervolemia, unspecified hypervolemia type    Clinical Impression: 1. SOB (shortness of breath)   2. Hypervolemia, unspecified hypervolemia type     Disposition: Admit  This note was prepared with assistance of Dragon voice recognition software. Occasional wrong-word or sound-a-like substitutions may have occurred due to the inherent limitations of voice recognition software.      Lory Galan, Canary Brim, MD 04/25/23 1524

## 2023-04-25 NOTE — Assessment & Plan Note (Signed)
Reported difficulty with urination lately. -If decreased urine output, would consider adding tamsulosin 0.4 mg -Monitor for signs of urinary retention, bladder scans if needed

## 2023-04-25 NOTE — ED Notes (Signed)
ED TO INPATIENT HANDOFF REPORT  ED Nurse Name and Phone #:  [phone]  S Name/Age/Gender [name] 82 y.o. male Room/Bed: 039C/039C  Code Status   Code Status: Full Code  Home/SNF/Other Home Patient oriented to: self  Is this baseline? No   Triage Complete: Triage complete  Chief Complaint Acute exacerbation of CHF (congestive heart failure) (HCC) [I50.9]  Triage Note Per [facility] pt coming from home family reports cough and shortness of breath x 1 month. States he has been seen by PCP. Right sided rhonchi and wheezing. Given 2.5 mg albuterol en route. Initially 90% on room air and placed on 3L [place]. Hx of CHF and EF 20.    Allergies Allergies  Allergen Reactions   Lipitor [Atorvastatin Calcium] Other (See Comments)    Causes memory loss   Crestor [Rosuvastatin Calcium] Other (See Comments)    Causes Muscle Pain   Zocor [Simvastatin] Other (See Comments)    Causes muscle pain    Level of Care/Admitting Diagnosis ED Disposition     ED Disposition  Admit   Condition  --   Comment  Hospital [facility]: [facility] [[id number]]  Level of Care: Telemetry Medical [[id number]]  May admit patient to [facility] or [facility] if equivalent level of care is available:: Yes  Covid Evaluation: Confirmed COVID Negative  Diagnosis: Acute exacerbation of CHF (congestive heart failure) [facility]) [[id number]]  Admitting Physician: Lorayne Bender [[id number]]  Attending Physician: Doreene Eland [[id number]]  Certification:: I certify this patient will need inpatient services for at least 2 midnights  Expected Medical Readiness: 04/27/2023          B Medical/Surgery History Past Medical History:  Diagnosis Date   Acute exacerbation of CHF (congestive heart failure) (HCC) 09/29/2011   Atherosclerotic heart disease of native coronary artery without angina pectoris 02/12/2010   Qualifier: Diagnosis of  By: Alesia Richards     Benign prostatic hyperplasia with lower urinary  tract symptoms 12/04/2019   CAD (coronary artery disease)    Hx of    Cellulitis 03/19/2012   Chronic obstructive pulmonary disease (HCC) 12/04/2019   Diverticulosis of colon (without mention of hemorrhage) 2011   Colonoscopy    Hiatal hernia 2011   EGD   Hypertension    Hypertensive heart disease without heart failure 12/04/2019   ITP (idiopathic thrombocytopenic purpura)    He has been diagnosed with   Pancytopenia    Thrombocytopenia, unspecified (HCC) 01/24/2013   TRANSAMINASES, SERUM, ELEVATED 02/12/2010   Qualifier: Diagnosis of  By: Alesia Richards     Type 2 diabetes mellitus with diabetic peripheral angiopathy without gangrene (HCC) 12/04/2019   Past Surgical History:  Procedure Laterality Date   CARDIAC CATHETERIZATION     His last heart catheterization in May of 2009 reveals a patent LIMA   CORONARY ANGIOPLASTY     Successful percutaneous transluminal coronary angioplasty of the left circumflex obuse marginal vessel   CORONARY ANGIOPLASTY WITH STENT PLACEMENT     CORONARY ARTERY BYPASS GRAFT     x4 with a left internal mammary artery anastomosis to the left anterior descending coronary artery    CORONARY STENT PLACEMENT     successful percutaneous transluminal coronary angioplasty and stenting of the left main coronary artery   HERNIA REPAIR     RIGHT/LEFT HEART CATH AND CORONARY/GRAFT ANGIOGRAPHY N/A 01/26/2022   Procedure: RIGHT/LEFT HEART CATH AND CORONARY/GRAFT ANGIOGRAPHY;  Surgeon: Lennette Bihari, MD;  Location: [facility] INVASIVE CV LAB;  Service: Cardiovascular;  Laterality: N/A;   SAPHENOUS VEIN GRAFT RESECTION     graft to the first obtuse marginal, a saphenous vein graft to the first diagonal coronary artery  and a saphenous vein graft to the distal right coronary artery   TEE WITHOUT CARDIOVERSION N/A 01/28/2022   Procedure: TRANSESOPHAGEAL ECHOCARDIOGRAM (TEE);  Surgeon: Thomasene Ripple, DO;  Location: [facility] ENDOSCOPY;  Service: Cardiovascular;  Laterality: N/A;    US ECHOCARDIOGRAPHY  02-08-2007   Est. EF 40-45%     A IV Location/Drains/Wounds Patient Lines/Drains/Airways Status     Active Line/Drains/Airways     Name Placement date Placement time Site Days   Peripheral IV 04/25/23 22 G 1" Posterior;Right Hand 04/25/23  1144  Hand  less than 1            Intake/Output Last 24 hours No intake or output data in the 24 hours ending 04/25/23 1910  Labs/Imaging Results for orders placed or performed during the hospital encounter of 04/25/23 (from the past 48 hour(s))  Lactic acid, plasma     Status: Abnormal   Collection Time: 04/25/23 12:35 PM  Result Value Ref Range   Lactic Acid, Venous 2.6 (HH) 0.5 - 1.9 mmol/L    Comment: FIRST CRITICAL CALL ATTEMPTED @1306  04/25/23 E,BENTON CRITICAL RESULT CALLED TO, READ BACK BY AND VERIFIED WITH K,BRANCH P EMT @1316  04/25/23 E,BENTON Performed at [facility] Lab, [address], [place], [place] [address]   Resp panel by RT-PCR (RSV, Flu A&B, Covid) Anterior Nasal Swab     Status: None   Collection Time: 04/25/23 12:35 PM   Specimen: Anterior Nasal Swab  Result Value Ref Range   SARS Coronavirus 2 by RT PCR NEGATIVE NEGATIVE   Influenza A by PCR NEGATIVE NEGATIVE   Influenza B by PCR NEGATIVE NEGATIVE    Comment: (NOTE) The Xpert Xpress SARS-CoV-2/FLU/RSV plus assay is intended as an aid in the diagnosis of influenza from Nasopharyngeal swab specimens and should not be used as a sole basis for treatment. Nasal washings and aspirates are unacceptable for Xpert Xpress SARS-CoV-2/FLU/RSV testing.  Fact Sheet for Patients: BloggerCourse.com  Fact Sheet for Healthcare Providers: SeriousBroker.it  This test is not yet approved or cleared by the [place] FDA and has been authorized for detection and/or diagnosis of SARS-CoV-2 by FDA under an Emergency Use Authorization (EUA). This EUA will remain in effect (meaning this test can be used)  for the duration of the COVID-19 declaration under Section 564(b)(1) of the Act, 21 U.S.C. section 360bbb-3(b)(1), unless the authorization is terminated or revoked.     Resp Syncytial Virus by PCR NEGATIVE NEGATIVE    Comment: (NOTE) Fact Sheet for Patients: BloggerCourse.com  Fact Sheet for Healthcare Providers: SeriousBroker.it  This test is not yet approved or cleared by the [place] FDA and has been authorized for detection and/or diagnosis of SARS-CoV-2 by FDA under an Emergency Use Authorization (EUA). This EUA will remain in effect (meaning this test can be used) for the duration of the COVID-19 declaration under Section 564(b)(1) of the Act, 21 U.S.C. section 360bbb-3(b)(1), unless the authorization is terminated or revoked.  Performed at [facility], [address], [place], [place] [address]   CBC with Differential     Status: Abnormal   Collection Time: 04/25/23 12:36 PM  Result Value Ref Range   WBC 12.0 (H) 4.0 - 10.5 K/uL   RBC 3.10 (L) 4.22 - 5.81 MIL/uL   Hemoglobin 9.7 (L) 13.0 - 17.0 g/dL  HCT 30.0 (L) 39.0 - 52.0 %   MCV 96.8 80.0 - 100.0 fL   MCH 31.3 26.0 - 34.0 pg   MCHC 32.3 30.0 - 36.0 g/dL   RDW 56.4 33.2 - 95.1 %   Platelets 253 150 - 400 K/uL   nRBC 0.0 0.0 - 0.2 %   Neutrophils Relative % 84 %   Neutro Abs 10.1 (H) 1.7 - 7.7 K/uL   Lymphocytes Relative 6 %   Lymphs Abs 0.7 0.7 - 4.0 K/uL   Monocytes Relative 9 %   Monocytes Absolute 1.1 (H) 0.1 - 1.0 K/uL   Eosinophils Relative 0 %   Eosinophils Absolute 0.0 0.0 - 0.5 K/uL   Basophils Relative 0 %   Basophils Absolute 0.0 0.0 - 0.1 K/uL   Immature Granulocytes 1 %   Abs Immature Granulocytes 0.12 (H) 0.00 - 0.07 K/uL    Comment: Performed at [facility], [address] [place], [place] [address]  Comprehensive metabolic panel     Status: Abnormal   Collection Time: 04/25/23 12:36 PM  Result Value Ref Range   Sodium  132 (L) 135 - 145 mmol/L   Potassium 5.0 3.5 - 5.1 mmol/L   Chloride 100 98 - 111 mmol/L   CO2 19 (L) 22 - 32 mmol/L   Glucose, Bld 244 (H) 70 - 99 mg/dL    Comment: Glucose reference range applies only to samples taken after fasting for at least 8 hours.   BUN 24 (H) 8 - 23 mg/dL   Creatinine, Ser 6.06 (H) 0.61 - 1.24 mg/dL   Calcium 9.1 8.9 - 30.1 mg/dL   Total Protein 6.9 6.5 - 8.1 g/dL   Albumin 2.9 (L) 3.5 - 5.0 g/dL   AST 43 (H) 15 - 41 U/L   ALT 39 0 - 44 U/L   Alkaline Phosphatase 56 38 - 126 U/L   Total Bilirubin 1.9 (H) 0.3 - 1.2 mg/dL   GFR, Estimated 57 (L) >60 mL/min    Comment: (NOTE) Calculated using the CKD-EPI Creatinine Equation (2021)    Anion gap 13 5 - 15    Comment: Performed at [facility], [address], [place], [place] [address]  Brain natriuretic peptide     Status: Abnormal   Collection Time: 04/25/23 12:36 PM  Result Value Ref Range   B Natriuretic Peptide 1,878.8 (H) 0.0 - 100.0 pg/mL    Comment: Performed at [facility] Lab, [address], [place], [place] [address]  Troponin I (High Sensitivity)     Status: Abnormal   Collection Time: 04/25/23 12:36 PM  Result Value Ref Range   Troponin I (High Sensitivity) 43 (H) <18 ng/L    Comment: (NOTE) Elevated high sensitivity troponin I (hsTnI) values and significant  changes across serial measurements may suggest ACS but many other  chronic and acute conditions are known to elevate hsTnI results.  Refer to the "Links" section for chest pain algorithms and additional  guidance. Performed at [facility], [address] [place], [place] [address]   I-Stat arterial blood gas, ED     Status: Abnormal   Collection Time: 04/25/23 12:56 PM  Result Value Ref Range   pH, Arterial 7.472 (H) 7.35 - 7.45   pCO2 arterial 24.1 (L) 32 - 48 mmHg   pO2, Arterial 104 83 - 108 mmHg   Bicarbonate 17.6 (L) 20.0 - 28.0 mmol/L   TCO2 18 (L) 22 - 32 mmol/L   O2  Saturation 98 %   Acid-base deficit 5.0  (H) 0.0 - 2.0 mmol/L   Sodium 130 (L) 135 - 145 mmol/L   Potassium 4.9 3.5 - 5.1 mmol/L   Calcium, Ion 1.18 1.15 - 1.40 mmol/L   HCT 29.0 (L) 39.0 - 52.0 %   Hemoglobin 9.9 (L) 13.0 - 17.0 g/dL   Patient temperature 16.1 F    Collection site RADIAL, ALLEN'S TEST ACCEPTABLE    Drawn by RT    Sample type ARTERIAL   Lactic acid, plasma     Status: None   Collection Time: 04/25/23  2:24 PM  Result Value Ref Range   Lactic Acid, Venous 1.4 0.5 - 1.9 mmol/L    Comment: Performed at [facility] Lab, [address] [place], [place] [address]  Troponin I (High Sensitivity)     Status: Abnormal   Collection Time: 04/25/23  2:24 PM  Result Value Ref Range   Troponin I (High Sensitivity) 33 (H) <18 ng/L    Comment: (NOTE) Elevated high sensitivity troponin I (hsTnI) values and significant  changes across serial measurements may suggest ACS but many other  chronic and acute conditions are known to elevate hsTnI results.  Refer to the "Links" section for chest pain algorithms and additional  guidance. Performed at [facility], [address] [place], [place] [address]   Digoxin level     Status: None   Collection Time: 04/25/23  2:24 PM  Result Value Ref Range   Digoxin Level 1.0 0.8 - 2.0 ng/mL    Comment: Performed at [facility] Lab, [address] [place], [place] [address]  Respiratory (~20 pathogens) panel by PCR     Status: None   Collection Time: 04/25/23  2:53 PM   Specimen: Nasopharyngeal Swab; Respiratory  Result Value Ref Range   Adenovirus NOT DETECTED NOT DETECTED   Coronavirus 229E NOT DETECTED NOT DETECTED    Comment: (NOTE) The Coronavirus on the Respiratory Panel, DOES NOT test for the novel  Coronavirus (2019 nCoV)    Coronavirus HKU1 NOT DETECTED NOT DETECTED   Coronavirus NL63 NOT DETECTED NOT DETECTED   Coronavirus OC43 NOT DETECTED NOT DETECTED   Metapneumovirus NOT DETECTED NOT DETECTED   Rhinovirus / Enterovirus NOT DETECTED NOT DETECTED    Influenza A NOT DETECTED NOT DETECTED   Influenza B NOT DETECTED NOT DETECTED   Parainfluenza Virus 1 NOT DETECTED NOT DETECTED   Parainfluenza Virus 2 NOT DETECTED NOT DETECTED   Parainfluenza Virus 3 NOT DETECTED NOT DETECTED   Parainfluenza Virus 4 NOT DETECTED NOT DETECTED   Respiratory Syncytial Virus NOT DETECTED NOT DETECTED   Bordetella pertussis NOT DETECTED NOT DETECTED   Bordetella Parapertussis NOT DETECTED NOT DETECTED   Chlamydophila pneumoniae NOT DETECTED NOT DETECTED   Mycoplasma pneumoniae NOT DETECTED NOT DETECTED    Comment: Performed at [facility] Lab, [address], [place], [place] [address]   DG Chest Portable 1 View  Result Date: 04/25/2023 CLINICAL DATA:  82 year old male with shortness of breath. EXAM: PORTABLE CHEST 1 VIEW COMPARISON:  Chest radiographs 04/13/2023 and earlier. FINDINGS: Portable AP upright view at 1220 hours. Small pleural effusions are less apparent. Stable CABG, mediastinal contours. Stable pulmonary vascularity, no overt edema. No pneumothorax or consolidation. No acute osseous abnormality identified. Negative visible bowel gas. IMPRESSION: Small pleural effusions on 04/13/2023 are less apparent. No new cardiopulmonary abnormality. Electronically Signed   By: Odessa Fleming M.D.   On: 04/25/2023 13:29  Pending Labs Unresulted Labs (From admission, onward)     Start     Ordered   04/26/23 0500  Basic metabolic panel  Tomorrow morning,   R        04/25/23 1555   04/26/23 0500  CBC  Tomorrow morning,   R        04/25/23 1555   04/25/23 1612  Hemoglobin A1c  Once,   R        04/25/23 1611   04/25/23 1612  TSH  Once,   R        04/25/23 1611   04/25/23 1240  Blood gas, arterial (at [facility] & AP)  Once,   R        04/25/23 1240            Vitals/Pain Today's Vitals   04/25/23 1445 04/25/23 1630 04/25/23 1700 04/25/23 1850  BP: 115/78  125/70 98/64  Pulse: 60  (!) 114 (!) 24  Resp: (!) 27  (!) 27 (!) 26  Temp:  97.9 F (36.6 C)  99.2  F (37.3 C)  TempSrc:  Oral  Axillary  SpO2: 99%  100% 100%  Weight:      Height:      PainSc:        Isolation Precautions Droplet precaution  Medications Medications  ezetimibe (ZETIA) tablet 10 mg (has no administration in time range)  clopidogrel (PLAVIX) tablet 75 mg (has no administration in time range)  benzonatate (TESSALON) capsule 200 mg (has no administration in time range)  olopatadine (PATANOL) 0.1 % ophthalmic solution 1 drop (has no administration in time range)  digoxin (LANOXIN) tablet 0.125 mg (has no administration in time range)  metoprolol succinate (TOPROL-XL) 24 hr tablet 12.5 mg (has no administration in time range)  enoxaparin (LOVENOX) injection 40 mg (has no administration in time range)  acetaminophen (TYLENOL) tablet 650 mg (has no administration in time range)    Or  acetaminophen (TYLENOL) suppository 650 mg (has no administration in time range)  predniSONE (DELTASONE) tablet 40 mg (has no administration in time range)  furosemide (LASIX) injection 40 mg (has no administration in time range)  azithromycin (ZITHROMAX) tablet 500 mg (has no administration in time range)  furosemide (LASIX) injection 40 mg (40 mg Intravenous Given 04/25/23 1425)    Mobility walks with device     Focused Assessments SOB /CHF   R Recommendations: See Admitting Provider Note  Report given to:   Additional Notes:

## 2023-04-25 NOTE — Assessment & Plan Note (Signed)
Many recurrent falls over the past year. -PT/OT -Fall precautions

## 2023-04-25 NOTE — ED Triage Notes (Signed)
Per [facility] pt coming from home family reports cough and shortness of breath x 1 month. States he has been seen by PCP. Right sided rhonchi and wheezing. Given 2.5 mg albuterol en route. Initially 90% on room air and placed on 3L [place]. Hx of CHF and EF 20.

## 2023-04-25 NOTE — H&P (Cosign Needed Addendum)
Hospital Admission History and Physical Service Pager: [phone]  Patient name: [name] Medical record number: [id number] Date of Birth: 01/06/1941 Age: 82 y.o. Gender: male  Primary Care Provider: Blane Ohara, MD Consultants: none Code Status: full code which was confirmed with family if patient unable to confirm   Preferred Emergency Contact:   Contact Information     Name Relation Home Work [place] Daughter  [phone]   [facility] Daughter   [phone]   Samaad, Hashem   [phone]      Other Contacts   None on File      Chief Complaint: shortness of breath and cough  Assessment and Plan: [name] is a 82 y.o. male presenting with 3 weeks of cough and shortness of breath. Differential for this patient's presentation of this includes CHF exacerbation, COPD exacerbation secondary to pneumonia, atelectasis 2/2 deconditioning, PE, ACS.   Elevated BNP combined with physical exam findings of LE edema and bibasilar crackles and increased pulmonary congestion on CXR points to CHF exacerbation. It appears the patient has been underdiuresed based on his overall picture and per family's report, pt has been taking about half of the recommended dosage of lasix prescribed by his PCP.   Several weeks of cough with purulent sputum plus CXR findings as above points to possible CAP and subsequent COPD exacerbation also contributing to current picture. PE considered given dyspnea and tachycardia but less likely given . Although patient has an extensive history of CAD, ACS is unlikely given lack of chest pain, stable EKG and slight elevation in troponin with downward trend. Assessment & Plan Acute on chronic systolic congestive heart failure (HCC) Dyspnea and orthopnea with peripheral edema and bibasilar crackles on exam and BNP 1878. Most recent echo in Oct 2023 with EF 25-30%. Failed outpatient diuresis with PO lasix, though was only  taking 20mg  BID vs prescribed 40mg  BID from PCP note on 10/1. S/p 1x Lasix 40mg  IV in the ED. Heart failure team agreed with plan, will place formal consult in AM. --admit to FMTS, med-tele, Dr Lum Babe attending --repeat Lasix 40mg  IV x1 --continue home digoxin, metoprolol --repeat Echo -- Risk stratification labs: TSH, A1c  --AM BMP --strict I/Os, daily weights --continuous telemetry given history of Afib with RVR --heart failure consult in AM  COPD exacerbation (HCC) Meets 3/3 cardinal symptoms- increased purulence, sputum volume, dyspnea. CXR with increased pulmonary vascular congestion and rhonchorous lung sounds. COVID/flu/RSV and RVP negative. Most recent PFTs with FEV1/FVC of 67.5% in 2021. Not on any home inhalers.  --azithromycin 500mg  PO x 3 days --prednisone 40mg  x5 days --tessalon 200mg  PRN --continuous pulse ox, maintain saturation 88-92% -- consider initiating LABA/LAMA --AM CBC Frailty Per family and chart review, pt has a history of falls and previously seen by palliative care in an outpatient setting, last appointment around March 2024. Lives in a tiny home in daughter's backyard and has help with medications. Per daughter, pt desires full code at this time. Pt unable to participate fully in code status conversation, appears to have element of cognitive decline of aging vs dementia though no documented diagnosis.  Daughter, [name], is HCPOA but all children are very actively involved in their father's care. Kim manages his medications, which were confirmed today. --PT/OT eval and treat --RD consult to assess nutrition status --discuss code status with patient once more stable --consider palliative consult to help discern GOC with pt and family  Chronic and Stable Conditions: CAD s/p  CABG x4: plavix 75mg  daily T2DM: euglycemic, holding home metformin, consider SSI based on CBG HLD: zetia  FEN/GI: carb modified VTE Prophylaxis: lovenox  Disposition: admit to  med-tele  History of Present Illness:  [name] is a 82 y.o. male presenting with dyspnea and cough.  He says his shortness of breath started 4 weeks ago, and just can't seem to get rid of his cough. Daughter reports he has not had any fever, a bit elevated to 99.0 F this morning. Coughing up a lot of phlegm lately- different colors but mostly brown and gray.  Daughter reports more falls lately. Fell between [employer] and bed yesterday.  He does not know his medications. Has been taking Mucinex and the tessalon perrl and Lasix 20 mg without much relief. Making good urine with this. Noticed a little bit of swelling in his legs but not elsewhere. No soreness.  No sick contacts that he is aware of. No chest pain or pleuritic pain.   Daughter [name] manages his medications.  In the ED, pt was tachycardic, tachypnic and afebrile. He was placed on 2L oxygen via [place]. BNP elevated to 1878, lactic acid elevated at 2.6, and troponin slightly elevated at 43. EKG negative for STEMI. Negative for flu, COVID, and RSV. CXR with more prominent vascular markings and less apparent small pleural effusions compared to prior image on 10/10  Review Of Systems: Per HPI with the following additions: none  Pertinent Past Medical History: COPD with FEV1/FVC 67.5 in 2021 CHF EF 25-30% Afib with RVR BPH CAD s/p CABG x4 Diverticulosis Hiatal hernia HTN ITP T2DM   Pertinent Past Surgical History: Past Surgical History:  Procedure Laterality Date   CARDIAC CATHETERIZATION     His last heart catheterization in May of 2009 reveals a patent LIMA   CORONARY ANGIOPLASTY     Successful percutaneous transluminal coronary angioplasty of the left circumflex obuse marginal vessel   CORONARY ANGIOPLASTY WITH STENT PLACEMENT     CORONARY ARTERY BYPASS GRAFT     x4 with a left internal mammary artery anastomosis to the left anterior descending coronary artery    CORONARY STENT PLACEMENT     successful  percutaneous transluminal coronary angioplasty and stenting of the left main coronary artery   HERNIA REPAIR     RIGHT/LEFT HEART CATH AND CORONARY/GRAFT ANGIOGRAPHY N/A 01/26/2022   Procedure: RIGHT/LEFT HEART CATH AND CORONARY/GRAFT ANGIOGRAPHY;  Surgeon: Lennette Bihari, MD;  Location: [facility] INVASIVE CV LAB;  Service: Cardiovascular;  Laterality: N/A;   SAPHENOUS VEIN GRAFT RESECTION     graft to the first obtuse marginal, a saphenous vein graft to the first diagonal coronary artery  and a saphenous vein graft to the distal right coronary artery   TEE WITHOUT CARDIOVERSION N/A 01/28/2022   Procedure: TRANSESOPHAGEAL ECHOCARDIOGRAM (TEE);  Surgeon: Thomasene Ripple, DO;  Location: [facility] ENDOSCOPY;  Service: Cardiovascular;  Laterality: N/A;   US ECHOCARDIOGRAPHY  02-08-2007   Est. EF 40-45%    Remainder reviewed in history tab.  Pertinent Social History: Tobacco use: Never Alcohol use: Denies Other Substance use: Denies Lives in a tiny home next to his daughter [name]  Pertinent Family History:  Remainder reviewed in history tab.   Important Outpatient Medications: Lasix- 20mg  BID MWF, once daily T/Th Vitamin D3 Plavix 75mg   Digoxin 0.125mg  daily Zetia 10mg  daily Metformin 500mg  once daily Metoprolol 12.5mg  daily Verquavo 5mg  daily   Remainder reviewed in medication history.   Objective: BP 125/70   Pulse (!) 114  Temp 97.9 F (36.6 C) (Oral)   Resp (!) 27   Ht 5\' 9"  (1.753 m)   Wt 72.6 kg   SpO2 100%   BMI 23.63 kg/m  Exam: General: Elderly, frail appearing male laying in hospital bed in NAD, pleasant and cooperative with exam Eyes: clear conjunctiva bilaterally ENTM: moist mucus membranes Neck: supple, no masses or thyromegaly Cardiovascular: RRR, normal S1/S2 Respiratory: speaking in short sentences, increased work of breathing on RA, diffusely rhonchorous with bibasilar crackles, good air movement. Coughs up copious amounts of white/yellow purulent sputum Gastrointestinal:  normoactive bowel sounds, soft, nontender, nondistended Derm: no lesions or rashes Neuro: 1/5 grip strength bilaterally, moving all extremities spontaneously Psych: appropriate mood and affect  Labs:  CBC BMET  Recent Labs  Lab 04/25/23 1236 04/25/23 1256  WBC 12.0*  --   HGB 9.7* 9.9*  HCT 30.0* 29.0*  PLT 253  --    Recent Labs  Lab 04/25/23 1236 04/25/23 1256  NA 132* 130*  K 5.0 4.9  CL 100  --   CO2 19*  --   BUN 24*  --   CREATININE 1.25*  --   GLUCOSE 244*  --   CALCIUM 9.1  --     BNP 1878 Digoxin level 1.0 Troponin 43 >>  Lactic acid: 2.6 >>  RVP: no viruses detected  EKG: Sinus tachycardia, multiple PVCs   Imaging Studies Performed:  CXR: Less blunting of costophrenic angles compared to CXR from 04/13/23, small pleural effusions on 04/13/2023 are less apparent. No new cardiopulmonary abnormality.   Darral Dash, DO 04/25/2023, 5:50 PM PGY-1, [facility]  FPTS Intern pager: [phone], text pages welcome Secure chat group [facility] Teaching Service    I evaluated the patient with Dr. Dolan Amen.  I agree with the above assessment and plan, with my edits within the note.  Darral Dash, DO PGY-3, [facility]

## 2023-04-25 NOTE — Hospital Course (Addendum)
[name] is a 82 y.o.male with a history of HFrEF (25-30%), COPD, Hx recurrent falls, Hx ITP, Cognitive decline, HTN, T2DM, Weakness, CAD s/p CABG x4 who was admitted to the [facility] Medicine Teaching Service at [facility] for dyspnea 2/2 acute HFpEF exacerbation and COPD exacerbation. His hospital course is detailed below:  Acute hypoxic respiratory failure  acute HFrEF exacerbation  Presented dyspneic with productive cough. CXR showed small pleural effusions, no new cardiopulmonary abnormality. Repeat echocardiogram showed 20-25% LVEF. BNP 1879. Treated with IV Lasix 80 mg with brisk diuresis then transitioned to oral Lasix 60 mg BID then back to home dose of 40 mg BID prior to discharge. Heart failure team was consulted and recommended continued treatment with digoxin and metoprolol.  GDMT in the outpatient setting not recommended given intolerance with hypotension, recurrent falls, frailty. Pt clinically improved with decreased edema, SOB and wheezing upon discharge and sating well. Pt also had suspected MAT on telemetry, managed medically with metoprolol.   COPD exacerbation Given increased sputum, purulence, dyspnea treated for presumed exacerbation with p.o. azithromycin 500 mg x 3 days, p.o. prednisone 40 mg x 3 days however only given 2 day course due to hyperglycemia. Started Anoro Ellipta inhaler then transitioned to revefenacin nebulizer. Was on O2 2L [place] but weaned off oxygen to 0.5 L for comfort measures.   Frailty Progressive decline over the past few months with increased falls at home, weakness.  Worked with PT/OT who recommended OT 1x/week and PT 3x/week minimum. Family aware of poor prognosis.  Goals of care Previously followed outpatient with palliative medicine. Daughter, [name] [facility]), agreed to consulting palliative care while inpatient. These discussions led to SNF while awaiting home hospice and transition to DNR/DNI.  Other chronic conditions were medically managed with home  medications and formulary alternatives as necessary. Pt transitioned from metformin 500 mg TID to BID then 1000 mg BID. Pt given Novolog for elevated glucose temporarily and prednisone discontinued.   PCP Follow-up Recommendations: Do no restart Vericiguat

## 2023-04-25 NOTE — Progress Notes (Signed)
FMTS Brief Progress Note  S: Patient was resting comfortably in the bed on evaluation.  He denies any chest pain or shortness of breath at this time and states that he feels better than he did earlier in the day.  He does not recall urinating since this morning.  He did cough several times throughout the evaluation without any sputum production.  O: BP (!) 115/98   Pulse (!) 118   Temp 99.2 F (37.3 C) (Axillary)   Resp (!) 26   Ht 5\' 9"  (1.753 m)   Wt 72.6 kg   SpO2 100%   BMI 23.63 kg/m   General: Elderly appearing man in no current distress. Cardiac: Tachycardic.  No M/R/G Respiratory: Mild intermittent wheezing appreciated in the right middle lung field.  No increased work of breathing. Extremities: 2+ pitting edema in the bilateral lower extremity.  Pedal pulses could not be palpated.    A/P: Mr. Gikas is an 82 year old gentleman with a history of CAD status post CABG x 4, type 2 diabetes mellitus, hyperlipidemia, COPD, CHF presenting with 2 weeks of worsening cough and shortness of breath, now suspected acute on chronic CHF and COPD exacerbation.  On exam his heart rate was elevated and irregular, we will get a repeat EKG to evaluate for new onset A-fib as a possible cause for CHF exacerbation.  We will also add on Xopenex for his wheezing. - Orders reviewed. Labs for AM ordered, which was adjusted as needed.  - If condition changes, plan includes reevaluation and redosing of diuresis.Gerrit Heck, DO 04/25/2023, 8:13 PM PGY-1, [facility] Night Resident  Please page [phone] with questions.

## 2023-04-26 ENCOUNTER — Encounter (HOSPITAL_COMMUNITY): Payer: Self-pay | Admitting: Family Medicine

## 2023-04-26 ENCOUNTER — Inpatient Hospital Stay (HOSPITAL_COMMUNITY): Payer: Medicare HMO

## 2023-04-26 ENCOUNTER — Other Ambulatory Visit (HOSPITAL_COMMUNITY): Payer: Self-pay

## 2023-04-26 DIAGNOSIS — E440 Moderate protein-calorie malnutrition: Secondary | ICD-10-CM | POA: Insufficient documentation

## 2023-04-26 DIAGNOSIS — R0602 Shortness of breath: Secondary | ICD-10-CM

## 2023-04-26 DIAGNOSIS — R54 Age-related physical debility: Secondary | ICD-10-CM | POA: Insufficient documentation

## 2023-04-26 DIAGNOSIS — R000 Tachycardia, unspecified: Secondary | ICD-10-CM

## 2023-04-26 DIAGNOSIS — I2510 Atherosclerotic heart disease of native coronary artery without angina pectoris: Secondary | ICD-10-CM

## 2023-04-26 DIAGNOSIS — I340 Nonrheumatic mitral (valve) insufficiency: Secondary | ICD-10-CM

## 2023-04-26 DIAGNOSIS — I5023 Acute on chronic systolic (congestive) heart failure: Secondary | ICD-10-CM | POA: Diagnosis not present

## 2023-04-26 DIAGNOSIS — R9431 Abnormal electrocardiogram [ECG] [EKG]: Secondary | ICD-10-CM | POA: Insufficient documentation

## 2023-04-26 DIAGNOSIS — I255 Ischemic cardiomyopathy: Secondary | ICD-10-CM | POA: Diagnosis not present

## 2023-04-26 DIAGNOSIS — J441 Chronic obstructive pulmonary disease with (acute) exacerbation: Secondary | ICD-10-CM | POA: Insufficient documentation

## 2023-04-26 LAB — BASIC METABOLIC PANEL
Anion gap: 10 (ref 5–15)
BUN: 23 mg/dL (ref 8–23)
CO2: 22 mmol/L (ref 22–32)
Calcium: 8.9 mg/dL (ref 8.9–10.3)
Chloride: 100 mmol/L (ref 98–111)
Creatinine, Ser: 1.05 mg/dL (ref 0.61–1.24)
GFR, Estimated: >60 mL/min (ref >60)
Glucose, Bld: 198 mg/dL — ABNORMAL HIGH (ref 70–99)
Potassium: 3.9 mmol/L (ref 3.5–5.1)
Sodium: 132 mmol/L — ABNORMAL LOW (ref 135–145)

## 2023-04-26 LAB — ECHOCARDIOGRAM COMPLETE
Calc EF: 23.3 %
Height: 70 in
MV M vel: 3.77 m/s
MV Peak grad: 56.9 mm[Hg]
Radius: 0.55 cm
S' Lateral: 5.5 cm
Single Plane A2C EF: 20.8 %
Single Plane A4C EF: 22.6 %
Weight: 2486.79 [oz_av]

## 2023-04-26 LAB — CBC
HCT: 27.2 % — ABNORMAL LOW (ref 39.0–52.0)
Hemoglobin: 9.2 g/dL — ABNORMAL LOW (ref 13.0–17.0)
MCH: 32.4 pg (ref 26.0–34.0)
MCHC: 33.8 g/dL (ref 30.0–36.0)
MCV: 95.8 fL (ref 80.0–100.0)
Platelets: 206 10*3/uL (ref 150–400)
RBC: 2.84 MIL/uL — ABNORMAL LOW (ref 4.22–5.81)
RDW: 13.9 % (ref 11.5–15.5)
WBC: 9.5 10*3/uL (ref 4.0–10.5)
nRBC: 0 % (ref 0.0–0.2)

## 2023-04-26 LAB — GLUCOSE, CAPILLARY: Glucose-Capillary: 228 mg/dL — ABNORMAL HIGH (ref 70–99)

## 2023-04-26 MED ORDER — ENSURE ENLIVE PO LIQD
237.0000 mL | Freq: Two times a day (BID) | ORAL | Status: DC
  Administered 2023-04-27 – 2023-05-04 (×14): 237 mL via ORAL

## 2023-04-26 MED ORDER — UMECLIDINIUM-VILANTEROL 62.5-25 MCG/ACT IN AEPB
1.0000 | INHALATION_SPRAY | Freq: Every day | RESPIRATORY_TRACT | Status: DC
  Filled 2023-04-26 (×2): qty 14

## 2023-04-26 MED ORDER — ADULT MULTIVITAMIN W/MINERALS CH
1.0000 | ORAL_TABLET | Freq: Every day | ORAL | Status: DC
  Administered 2023-04-26 – 2023-05-04 (×9): 1 via ORAL
  Filled 2023-04-26 (×9): qty 1

## 2023-04-26 MED ORDER — MELATONIN 3 MG PO TABS
3.0000 mg | ORAL_TABLET | Freq: Every day | ORAL | Status: DC
  Administered 2023-04-27 – 2023-05-03 (×5): 3 mg via ORAL
  Filled 2023-04-26 (×7): qty 1

## 2023-04-26 MED ORDER — FUROSEMIDE 10 MG/ML IJ SOLN
80.0000 mg | Freq: Once | INTRAMUSCULAR | Status: AC
  Administered 2023-04-26: 80 mg via INTRAVENOUS
  Filled 2023-04-26: qty 8

## 2023-04-26 NOTE — Progress Notes (Signed)
Initial Nutrition Assessment  DOCUMENTATION CODES:   Non-severe (moderate) malnutrition in context of chronic illness  INTERVENTION:  Assistance with feeding Liberalize diet to regular to promote adequate PO intake in the setting of moderate malnutrition, advanced age and increased nutrition needs Ensure Enlive po BID, each supplement provides 350 kcal and 20 grams of protein. MVI with minerals daily Recommend DM coordinator evaluation for insulin recommendations d/t history of diabetes and steroid use; reached out to MD team  "High Calorie, High Protein Nutrition Therapy" handout AVS  NUTRITION DIAGNOSIS:   Moderate Malnutrition related to chronic illness (COPD, CHF, CAD) as evidenced by severe fat depletion, moderate muscle depletion, edema.  GOAL:   Patient will meet greater than or equal to 90% of their needs  MONITOR:   PO intake, Supplement acceptance, Labs, Weight trends  REASON FOR ASSESSMENT:   Consult Assessment of nutrition requirement/status  ASSESSMENT:   Pt admitted with SOB and cough. PMH significant for CHF, COPD, afib, CAD, T2DM  Spoke with pt at bedside. NT also present during visit. NT reports pt needing assistance with meals d/t shaky hands. Pt receives assistance at home with meals. Reached out to OT   Nutrition history limited d/t pt with frequent coughing. Pt states that he typically eats 3 meals per day however portions have been less. He denies difficulty chewing/swallowing foods.    Meal completions: 10/14: 50% breakfast  Pt feels that he has likely lost some weight however is unable to recall amount. Per review of weight history, it appears his weight has gradually been increasing over the last year  Moderate pitting BLE edema documented per nursing today.   Medications: zithromax, lasix, prednisone  Labs: sodium 132, HgbA1c 6.5%, CBG's 228  NUTRITION - FOCUSED PHYSICAL EXAM:  Flowsheet Row Most Recent Value  Orbital Region Severe  depletion  Upper Arm Region Severe depletion  Thoracic and Lumbar Region Mild depletion  Buccal Region Moderate depletion  Temple Region Moderate depletion  Clavicle Bone Region Severe depletion  Clavicle and Acromion Bone Region Moderate depletion  Scapular Bone Region Moderate depletion  Dorsal Hand Moderate depletion  Patellar Region Moderate depletion  Anterior Thigh Region Moderate depletion  Posterior Calf Region Mild depletion  Edema (RD Assessment) None  Hair Reviewed  Eyes Reviewed  Mouth Reviewed  Skin Reviewed  Nails Reviewed       Diet Order:   Diet Order             Diet Carb Modified Fluid consistency: Thin; Room service appropriate? Yes  Diet effective now                   EDUCATION NEEDS:   Education needs have been addressed  Skin:  Skin Assessment: Reviewed RN Assessment  Last BM:  10/13  Height:   Ht Readings from Last 1 Encounters:  04/25/23 5\' 10"  (1.778 m)    Weight:   Wt Readings from Last 1 Encounters:  04/26/23 70.5 kg   BMI:  Body mass index is 22.3 kg/m.  Estimated Nutritional Needs:   Kcal:  1800-2000  Protein:  90-105g  Fluid:  >/=1.8L  Drusilla Kanner, RDN, LDN Clinical Nutrition

## 2023-04-26 NOTE — TOC Progression Note (Signed)
Transition of Care [facility]) - Progression Note    Patient Details  Name: [name] MRN: [id number] Date of Birth: 27-Nov-1940  [facility]) CM/SW Contact  Valeska Haislip A [place], [place] Phone Number: 04/26/2023, 4:29 PM  Clinical Narrative:     CSW met with pt at bedside along with pt's daughter Misty Stanley. She stated that pt lived in a "tiny home" on her sister, pt's other daughter, Kim's property. She said pt was living alone and had been experiencing increased falls over the last 6 months. She said that pt would need SNF but pt is reluctant. Pt went to SNF around July to [facility] and that is their preference.   SNF work up completed. Bed offers pending.   TOC will continue to follow.   Expected Discharge Plan: Skilled Nursing Facility Barriers to Discharge: Continued Medical Work up, English as a second language teacher, SNF Pending bed offer  Expected Discharge Plan and Services       Living arrangements for the past 2 months: Single Family Home                                       Social Determinants of Health (SDOH) Interventions SDOH Screenings   Food Insecurity: No Food Insecurity (04/25/2023)  Housing: Low Risk  (04/25/2023)  Transportation Needs: No Transportation Needs (04/25/2023)  Utilities: Not At Risk (04/25/2023)  Alcohol Screen: Low Risk  (01/26/2022)  Depression (PHQ2-9): Low Risk  (02/17/2023)  Financial Resource Strain: Low Risk  (01/26/2022)  Physical Activity: Sufficiently Active (08/19/2022)  Social Connections: Moderately Isolated (08/19/2022)  Stress: No Stress Concern Present (08/19/2022)  Tobacco Use: Low Risk  (04/25/2023)  Health Literacy: Adequate Health Literacy (02/17/2023)    Readmission Risk Interventions     No data to display

## 2023-04-26 NOTE — Assessment & Plan Note (Signed)
Meets 3/3 cardinal symptoms- increased purulence, sputum volume, dyspnea. CXR with increased pulmonary vascular congestion and rhonchorous lung sounds. COVID/flu/RSV and RVP negative. Most recent PFTs with FEV1/FVC of 67.5% in 2021. Not on any home inhalers.  --azithromycin 500mg  PO x 3 days --prednisone 40mg  x5 days --tessalon 200mg  PRN --continuous pulse ox, maintain saturation 88-92% -- consider initiating LABA/LAMA --AM CBC

## 2023-04-26 NOTE — Assessment & Plan Note (Addendum)
Dyspnea and orthopnea with peripheral edema and bibasilar crackles on exam and BNP 1878. Most recent echo in Oct 2023 with EF 25-30%. Failed outpatient diuresis with PO lasix, though was only taking 20mg  BID vs prescribed 40mg  BID from PCP note on 10/1. TSH WNL, A1c 6.5. S/p 2x Lasix 40mg  IV yesterday wit [facility]. --Lasix 80mg  IV today --formal heart failure consult today --continue home digoxin, metoprolol --repeat Echo --AM BMP --strict I/Os, daily weights

## 2023-04-26 NOTE — Consult Note (Addendum)
Cardiology Consultation   Patient ID: [name] MRN: [id number]; DOB: Apr 11, 1941  Admit date: 04/25/2023 Date of Consult: 04/26/2023  PCP:  Blane Ohara, MD   [facility] Providers Cardiologist:  Kristeen Miss, MD  Electrophysiologist:  Lanier Prude, MD  Advanced Heart Failure:  Marca Ancona, MD       Patient Profile:   [name] is a 82 y.o. male with a hx of CAD s/p CABG 2007 maintained on Plavix, chronic HFrEF, moderate to severe MR and mild AI, HLD (intolerant of statins), multifocal atrial tach 01/2022, NSVT/PSVT/PACs/PVCs by monitor 09/2022, DM, COPD, BPH, hiatal hernia, thrombocytopenia, cystic lesion in upper abdomen by CT 01/2023 who is being seen 04/26/2023 for the evaluation of CHF at the request of Dr. Lum Babe.  History of Present Illness:   [name] has followed with Dr. Elease Hashimoto and CHF clinic for the above history. In 01/2022 he was found to have severely depressed LVEF of <20% with moderate-severe MR, severe pulmonary HTN. Cardiac cath showed severe multivessel CAD, patent LIMA to LAD, patent SVG to distal RCA, occluded SVG to diagonal, occluded SVG to Cx. There were no targets for intervention. Mean PA pressure was 30 mmHg. TEE showed EF <20%, mitral annular dilatation with severe secondary mitral regurgitation. At the time of initial evaluation, titration of GDMT was recommended. He saw Dr. Excell Seltzer and they decided against mTEER due to frailty and uncertainty that he would benefit significantly. GDMT has been limited by orthostasis/fatigue - he was unable to tolerate Jardiance, low dose spironolactone and losartan due to this. He saw Dr. Lalla Brothers 07/2022 to discuss CCM. He had had some improvement in symptoms by addition of vericiguat and plan was to continue medical therapy and avoid invasive procedures. He was not felt to be a candidate for ICD given age; QRS too narrow to benefit from CRT. Monitor 09/2022 done for falls showed predominantly NSR, 14  runs NSVT, 528 runs of PSVT (longest 17 sec), 2.6% PVCs, 3.1% PACs, felt to be reassuring. He was admitted in 01/2023 after a fall with no loss of consciousness. He was found to have viral PNA/rhinovirus and ended up discharged to SNF. He has since come back home. He lives by himself but on the property of his daughter who looks after him regularly. He uses a pill box to take his medications.  He returned to the hospital via EMS with worsening cough and SOB for several weeks. He had an additional mechanical fall as well. He saw PCP 10/1 with trace pleural effusions so Lasix was increased to 20mg  daily up from 20mg  MWF. On recheck he still had elevated pBNP so Lasix was increased to 40mg  once daily (clarified in result note 10/8 to take 40mg  once daily, rx listed as 40mg  BID). The patient's son today says they've been taking the Lasix exactly how this was relayed. Due to continued symptoms he came to the ER. In the ED, pt was tachycardic, tachypnic and afebrile. He was placed on 2L oxygen via [place]. CXR c/w small pleural effusions, BNP 1878. Lactic acid 2.6, NA 132, Cr 1.25, hsTroponin 43->33, digoxin 1.0. Flu/Covid/RSV/RVP neg. He was given nebs, abx for possible AECOPD, and multiple doses of IV Lasix (40+40 yesterday, 80mg  IV today). Primary team notes also outline plan to re-engage palliative care; they were involved in prior admission. Recent weights variable, 149-160lb. He feels marginally better today. He continues with LE edema which they report is chronic. He has not had any chest pain. EKG  and telemetry shows an irregular tachycardia, felt most consistent with MAT on telemetry.  Past Medical History:  Diagnosis Date   Aortic insufficiency    Benign prostatic hyperplasia with lower urinary tract symptoms 12/04/2019   CAD (coronary artery disease)    Hx of    Cellulitis 03/19/2012   Chronic HFrEF (heart failure with reduced ejection fraction) (HCC)    Chronic obstructive pulmonary disease (HCC)  12/04/2019   Diverticulosis of colon (without mention of hemorrhage) 2011   Colonoscopy    Hiatal hernia 2011   EGD   Hypertension    Hypertensive heart disease without heart failure 12/04/2019   ITP (idiopathic thrombocytopenic purpura)    He has been diagnosed with   Mitral regurgitation    Multifocal atrial tachycardia (HCC)    NSVT (nonsustained ventricular tachycardia) (HCC)    Pancytopenia    Premature atrial contractions    PSVT (paroxysmal supraventricular tachycardia) (HCC)    PVC's (premature ventricular contractions)    Thrombocytopenia, unspecified (HCC) 01/24/2013   TRANSAMINASES, SERUM, ELEVATED 02/12/2010   Qualifier: Diagnosis of  By: Alesia Richards     Type 2 diabetes mellitus with diabetic peripheral angiopathy without gangrene (HCC) 12/04/2019    Past Surgical History:  Procedure Laterality Date   CARDIAC CATHETERIZATION     His last heart catheterization in May of 2009 reveals a patent LIMA   CORONARY ANGIOPLASTY     Successful percutaneous transluminal coronary angioplasty of the left circumflex obuse marginal vessel   CORONARY ANGIOPLASTY WITH STENT PLACEMENT     CORONARY ARTERY BYPASS GRAFT     x4 with a left internal mammary artery anastomosis to the left anterior descending coronary artery    CORONARY STENT PLACEMENT     successful percutaneous transluminal coronary angioplasty and stenting of the left main coronary artery   HERNIA REPAIR     RIGHT/LEFT HEART CATH AND CORONARY/GRAFT ANGIOGRAPHY N/A 01/26/2022   Procedure: RIGHT/LEFT HEART CATH AND CORONARY/GRAFT ANGIOGRAPHY;  Surgeon: Lennette Bihari, MD;  Location: [facility] INVASIVE CV LAB;  Service: Cardiovascular;  Laterality: N/A;   SAPHENOUS VEIN GRAFT RESECTION     graft to the first obtuse marginal, a saphenous vein graft to the first diagonal coronary artery  and a saphenous vein graft to the distal right coronary artery   TEE WITHOUT CARDIOVERSION N/A 01/28/2022   Procedure: TRANSESOPHAGEAL  ECHOCARDIOGRAM (TEE);  Surgeon: Thomasene Ripple, DO;  Location: [facility] ENDOSCOPY;  Service: Cardiovascular;  Laterality: N/A;   US ECHOCARDIOGRAPHY  02-08-2007   Est. EF 40-45%     Home Medications:  Prior to Admission medications   Medication Sig Start Date End Date Taking? Authorizing Provider  benzonatate (TESSALON) 200 MG capsule Take 1 capsule (200 mg total) by mouth 3 (three) times daily as needed for cough. 04/22/23  Yes Renne Crigler, FNP  cholecalciferol (VITAMIN D3) 25 MCG (1000 UT) tablet Take 5,000 Units by mouth daily.   Yes [provider]  clopidogrel (PLAVIX) 75 MG tablet Take 1 tablet by mouth once daily 01/25/23  Yes Nahser, Deloris Ping, MD  dextromethorphan-guaiFENesin (SAFETUSSIN DM COUGH/CHEST CONG) 10-100 MG/5ML liquid Take 30 mLs by mouth 4 (four) times daily.   Yes [provider]  digoxin (LANOXIN) 0.125 MG tablet Take 1 tablet (0.125 mg total) by mouth daily. 05/29/22  Yes Laurey Morale, MD  ezetimibe (ZETIA) 10 MG tablet Take 1 tablet by mouth once daily 03/09/23  Yes Nahser, Deloris Ping, MD  furosemide (LASIX) 40 MG tablet One in am  and one before lunch. 04/22/23  Yes Renne Crigler, FNP  guaiFENesin (MUCINEX) 600 MG 12 hr tablet Take 1 tablet (600 mg total) by mouth once for 1 dose. 04/20/23 04/25/23 Yes Renne Crigler, FNP  metFORMIN (GLUCOPHAGE) 500 MG tablet Take 1 tablet (500 mg total) by mouth 4 (four) times daily. 03/29/23  Yes Cox, Kirsten, MD  metoprolol succinate (TOPROL-XL) 25 MG 24 hr tablet Take 0.5 tablets (12.5 mg total) by mouth at bedtime. 08/21/22  Yes Milford, Anderson Malta, FNP  Multiple Vitamin (MULTIVITAMIN) tablet Take 1 tablet by mouth daily.   Yes [provider]  nitroGLYCERIN (NITROSTAT) 0.4 MG SL tablet Place 1 tablet (0.4 mg total) under the tongue every 5 (five) minutes as needed for chest pain. 02/02/22 05/30/23 Yes Nahser, Deloris Ping, MD  olopatadine (PATADAY) 0.1 % ophthalmic solution Place 1 drop into both eyes 2 (two) times daily.    Yes [provider]  potassium chloride SA (KLOR-CON M) 20 MEQ tablet Take 1 tablet (20 mEq total) by mouth every Monday, Wednesday, and Friday. Patient taking differently: Take 40 mEq by mouth daily. 08/24/22  Yes Milford, Anderson Malta, FNP  Vericiguat (VERQUVO) 5 MG TABS Take 1 tablet (5 mg total) by mouth daily. 03/17/23  Yes Milford, Anderson Malta, FNP  glucose blood (ONETOUCH ULTRA) test strip AS DIRECTED 01/07/23   Cox, Fritzi Mandes, MD  Insulin Pen Needle (BD PEN NEEDLE NANO U/F) 32G X 4 MM MISC 1 each by Does not apply route daily. 03/03/22   [name], MD  Lancets MISC 1 each by Does not apply route daily as needed. Please provide lancets that fit patients lancet device 10/09/19   [name], MD  ofloxacin (OCUFLOX) 0.3 % ophthalmic solution Place 1 drop into both eyes 4 (four) times daily. Patient not taking: Reported on 04/25/2023 02/26/23   CoxFritzi Mandes, MD    Inpatient Medications: Scheduled Meds:  azithromycin  500 mg Oral Daily   clopidogrel  75 mg Oral Daily   digoxin  0.125 mg Oral Daily   enoxaparin (LOVENOX) injection  40 mg Subcutaneous Q24H   ezetimibe  10 mg Oral Daily   melatonin  3 mg Oral QHS   metoprolol succinate  12.5 mg Oral Daily   olopatadine  1 drop Both Eyes BID   predniSONE  40 mg Oral Q breakfast   umeclidinium-vilanterol  1 puff Inhalation Daily   Continuous Infusions:  PRN Meds: acetaminophen **OR** acetaminophen, benzonatate  Allergies:    Allergies  Allergen Reactions   Lipitor [Atorvastatin Calcium] Other (See Comments)    Causes memory loss   Crestor [Rosuvastatin Calcium] Other (See Comments)    Causes Muscle Pain   Zocor [Simvastatin] Other (See Comments)    Causes muscle pain    Social History:   Social History   Socioeconomic History   Marital status: Married    Spouse name: Doris   Number of children: 3   Years of education: Not on file   Highest education level: High school graduate  Occupational History    Occupation: Retired   Tobacco Use   Smoking status: Never   Smokeless tobacco: Never  Vaping Use   Vaping status: Never Used  Substance and Sexual Activity   Alcohol use: No   Drug use: No   Sexual activity: Not Currently  Other Topics Concern   Not on file  Social History Narrative   Daily caffeine use    Social Determinants of Health   Financial  Resource Strain: Low Risk  (01/26/2022)   Overall Financial Resource Strain (CARDIA)    Difficulty of Paying Living Expenses: Not hard at all  Food Insecurity: No Food Insecurity (04/25/2023)   Hunger Vital Sign    Worried About Running Out of Food in the Last Year: Never true    Ran Out of Food in the Last Year: Never true  Transportation Needs: No Transportation Needs (04/25/2023)   PRAPARE - Administrator, Civil Service (Medical): No    Lack of Transportation (Non-Medical): No  Physical Activity: Sufficiently Active (08/19/2022)   Exercise Vital Sign    Days of Exercise per Week: 7 days    Minutes of Exercise per Session: 30 min  Stress: No Stress Concern Present (08/19/2022)   [employer] of Occupational Health - Occupational Stress Questionnaire    Feeling of Stress : Not at all  Social Connections: Moderately Isolated (08/19/2022)   Social Connection and Isolation Panel [NHANES]    Frequency of Communication with Friends and Family: Three times a week    Frequency of Social Gatherings with Friends and Family: Three times a week    Attends Religious Services: More than 4 times per year    Active Member of Clubs or Organizations: No    Attends Banker Meetings: Never    Marital Status: Widowed  Intimate Partner Violence: Not At Risk (04/25/2023)   Humiliation, Afraid, Rape, and Kick questionnaire    Fear of Current or Ex-Partner: No    Emotionally Abused: No    Physically Abused: No    Sexually Abused: No    Family History:   Family History  Problem Relation Age of Onset   Cerebrovascular  Accident Mother    Colon cancer Neg Hx      ROS:  Please see the history of present illness.  All other ROS reviewed and negative.     Physical Exam/Data:   Vitals:   04/26/23 0356 04/26/23 0747 04/26/23 1117 04/26/23 1322  BP: 102/73 107/74 108/73   Pulse: (!) 101 (!) 108 (!) 113 (!) 105  Resp: 18 16 (!) 28 20  Temp: 99.1 F (37.3 C) (!) 97.5 F (36.4 C) 99.4 F (37.4 C)   TempSrc: Oral Oral Oral   SpO2: 94% 98% 98% 100%  Weight: 70.5 kg     Height:        Intake/Output Summary (Last 24 hours) at 04/26/2023 1424 Last data filed at 04/26/2023 0850 Gross per 24 hour  Intake 360 ml  Output 1200 ml  Net -840 ml      04/26/2023    3:56 AM 04/25/2023   10:24 PM 04/25/2023   11:41 AM  Last 3 Weights  Weight (lbs) 155 lb 6.8 oz 156 lb 8.4 oz 160 lb  Weight (kg) 70.5 kg 71 kg 72.576 kg     Body mass index is 22.3 kg/m.  General: Thin frail elderly WM in no acute distress. Head: Normocephalic, atraumatic, sclera non-icteric, no xanthomas, nares are without discharge. Neck: Negative for carotid bruits. JVP not elevated. Lungs: Diminished at bases, no wheezes, rales, or rhonchi. Breathing is unlabored. Heart: Irregularly irregular S1 S2 without murmurs, rubs, or gallops.  Abdomen: Soft, non-tender, non-distended with normoactive bowel sounds. No rebound/guarding. Extremities: No clubbing or cyanosis. 1+ soft pitting BLE edema. Distal pedal pulses are 2+ and equal bilaterally. Neuro: Alert and oriented X 3. Moves all extremities spontaneously. Psych:  Responds to questions appropriately with a normal affect.   EKG:  The EKG was personally reviewed and demonstrates:   Challenging EKG to interpret given baseline artifact, question sinus tach with occasional PVCs, RBBB+LAFB. F/u EKG appears similar.    Telemetry:  Telemetry was personally reviewed and demonstrates:  outlined above  Relevant CV Studies: Monitor 09/2022  Patch Wear Time:  14 days and 0 hours  (2024-02-09T10:26:25-0500 to 2024-02-23T10:26:25-0500)   Patient had a min HR of 62 bpm, max HR of 194 bpm, and avg HR of 81 bpm. Predominant underlying rhythm was Sinus Rhythm. 14 Ventricular Tachycardia runs occurred, the run with the fastest interval lasting 6 beats with a max rate of 194 bpm, the longest  lasting 8 beats with an avg rate of 111 bpm. 528 Supraventricular Tachycardia runs occurred, the run with the fastest interval lasting 17 beats with a max rate of 167 bpm, the longest lasting 16.9 secs with an avg rate of 107 bpm. Isolated SVEs were  occasional (3.1%, 50267), SVE Couplets were rare (<1.0%, 4375), and SVE Triplets were rare (<1.0%, 1462). Isolated VEs were occasional (2.6%, 41429), VE Couplets were rare (<1.0%, 962), and VE Triplets were rare (<1.0%, 39). Ventricular Bigeminy and  Trigeminy were present.    Conclusion:  1. Predominantly NSR.  2. 14 runs NSVT, longest was 8 beats.  3. 528 runs of short SVT.  4. No atrial fibrillation noted.  5. 2.6% PVCs 6. 3.1% PACs  2D echo 04/2022   1. Global hypokinesis worse in septum/apex and inferior base ? ischemic  MR. Left ventricular ejection fraction, by estimation, is 25 to 30%. The  left ventricle has severely decreased function. The left ventricle has no  regional wall motion  abnormalities. The left ventricular internal cavity size was moderately  dilated. Left ventricular diastolic parameters were normal.   2. Right ventricular systolic function is normal. The right ventricular  size is normal. There is normal pulmonary artery systolic pressure.   3. Left atrial size was moderately dilated.   4. Likely ischemic MR similar to TEE done in July . The mitral valve is  abnormal. Moderate to severe mitral valve regurgitation. No evidence of  mitral stenosis.   5. The aortic valve is tricuspid. There is mild calcification of the  aortic valve. There is mild thickening of the aortic valve. Aortic valve  regurgitation is  mild. Aortic valve sclerosis/calcification is present,  without any evidence of aortic  stenosis.   6. The inferior vena cava is normal in size with greater than 50%  respiratory variability, suggesting right atrial pressure of 3 mmHg.    Cath 01/2022    1st Diag lesion is 90% stenosed.   Prox RCA to Mid RCA lesion is 100% stenosed.   Origin to Prox Graft lesion is 100% stenosed.   Prox Cx to Mid Cx lesion is 90% stenosed.   2nd Mrg lesion is 90% stenosed.   Lat 2nd Mrg lesion is 95% stenosed.   Origin to Prox Graft lesion is 100% stenosed.   Severe native coronary artery disease with significant coronary calcification.  The LAD is totally occluded after the first diagonal vessel and the first diagonal vessel has 90% ostial stenosis.   Left circumflex vessel has a 90% focal proximal stenosis and 90% stenosis in the marginal branch with distal 95% branch stenosis.   Right coronary artery is totally occluded proximally.   The LIMA graft supplying the LAD is widely patent.   The vein graft supplying the distal RCA is patent.  There is irregularity of the distal  PD/PLA vasculature.   The vein graft which had supplied the diagonal vessel is totally occluded. (Old)   The vein graft had supplied the circumflex vessel is totally occluded. (Old)   Mildly elevated right heart pressures with mild pulmonary venous hypertension and PA pressure at 30 mmHg.   RECOMMENDATION: Patient will be undergoing TEE for further evaluation of mitral valve and valvular apparatus with concern for possible torn chordae on the transthoracic echo.  We will have colleagues review angiographic images with high-grade circumflex stenoses.     Laboratory Data:  High Sensitivity Troponin:   Recent Labs  Lab 04/25/23 1236 04/25/23 1424  TROPONINIHS 43* 33*     Chemistry Recent Labs  Lab 04/20/23 1023 04/25/23 1236 04/25/23 1256 04/26/23 0323  NA 134 132* 130* 132*  K 4.9 5.0 4.9 3.9  CL 97 100  --  100   CO2 22 19*  --  22  GLUCOSE 226* 244*  --  198*  BUN 12 24*  --  23  CREATININE 1.03 1.25*  --  1.05  CALCIUM 9.4 9.1  --  8.9  GFRNONAA  --  57*  --  >60  ANIONGAP  --  13  --  10    Recent Labs  Lab 04/20/23 1023 04/25/23 1236  PROT 6.8 6.9  ALBUMIN 3.6* 2.9*  AST 21 43*  ALT 18 39  ALKPHOS 60 56  BILITOT 0.9 1.9*   Lipids No results for input(s): "CHOL", "TRIG", "HDL", "LABVLDL", "LDLCALC", "CHOLHDL" in the last 168 hours.  Hematology Recent Labs  Lab 04/25/23 1236 04/25/23 1256 04/26/23 0323  WBC 12.0*  --  9.5  RBC 3.10*  --  2.84*  HGB 9.7* 9.9* 9.2*  HCT 30.0* 29.0* 27.2*  MCV 96.8  --  95.8  MCH 31.3  --  32.4  MCHC 32.3  --  33.8  RDW 13.8  --  13.9  PLT 253  --  206   Thyroid  Recent Labs  Lab 04/25/23 2143  TSH 2.444    BNP Recent Labs  Lab 04/20/23 1023 04/25/23 1236  BNP  --  1,878.8*  PROBNP 5,629*  --     DDimer No results for input(s): "DDIMER" in the last 168 hours.   Radiology/Studies:  DG Chest Portable 1 View  Result Date: 04/25/2023 CLINICAL DATA:  82 year old male with shortness of breath. EXAM: PORTABLE CHEST 1 VIEW COMPARISON:  Chest radiographs 04/13/2023 and earlier. FINDINGS: Portable AP upright view at 1220 hours. Small pleural effusions are less apparent. Stable CABG, mediastinal contours. Stable pulmonary vascularity, no overt edema. No pneumothorax or consolidation. No acute osseous abnormality identified. Negative visible bowel gas. IMPRESSION: Small pleural effusions on 04/13/2023 are less apparent. No new cardiopulmonary abnormality. Electronically Signed   By: Odessa Fleming M.D.   On: 04/25/2023 13:29     Assessment and Plan:   1. Acute on chronic HFrEF - received 80mg  IV Lasix yesterday then again today; patient reports mild improvement in dyspnea - will discuss re-dosing with MD - 2D echo pending - getting steroids, abx, inhalers by primary team for AECOPD as well - continue digoxin, metoprolol - traditionally has not  tolerated advancement of GDMT otherwise - resume vericiguat upon dc  2. Tachycardia, suspected MAT - also h/o PSVT, NSVT, PACs, PVCs - TSH wnl - will discuss tele & subsequent management with MD  2. Moderate-severe mitral regurgitation, mild aortic insufficiency - has been managed conservatively, mTEER not previously pursued - repeat echo pending  3.  CAD s/p CABG 2007, last cath 01/2022 managed medically - hsTroponins low/flat not reflective of ACS  - appears to have been managed with long term Plavix without aspirin - continue Zetia  4. AKI, hyponatremia - Cr improved - following Na in context above  Risk Assessment/Risk Scores:        New York Heart Association (NYHA) Functional Class NYHA Class III-IV    For questions or updates, please contact [facility] Please consult [email or web address] for contact info under    Signed, Laurann Montana, PA-C  04/26/2023 2:24 PM  History and all data above reviewed.  Patient examined.  I agree with the findings as above.  The patient was brought in via EMS because of cough and wheezing.  He lives in a small house but is seen and attended to by his family.  He been having upper respiratory-like symptoms for couple of weeks with a cough.  However, he been doing okay at home.  He does not have home O2.  He gets around slowly in a small house with a walker.  His family checks on him all the time.  He had been getting a little more cough and weakness so his granddaughter was staying with him this weekend.  They do have a caregiver every day for a few hours.  Her last day on Saturday she got some [employer] as a treat for the patient.   The granddaughter noticed that late that evening he was having increased which she felt was wheezing but she videotaped and he was more stridorous.  He was more somnolent and little confused.  He had more cough productive of green sputum.  He had a little more lower extremity swelling.   Today the history is  mostly from the granddaughter as the patient is somnolent.   The patient exam reveals COR: Regular rate and rhythm,  Lungs: Decreased breath sounds with scattered coarse crackles,  Abd: Positive bowel sounds normal frequency pitch, bruits, rebound, guarding, Ext 2+ pulses, mild edema at the ankles bilaterally.  All available labs, radiology testing, previous records reviewed. Agree with documented assessment and plan.  Acute on chronic systolic heart failure the patient has not been tolerant of any med titration in the past.  He probably is volume overloaded and may have gotten a little extra salt in his diet.  At this point he seems to be diuresing well with IV diuresis and per his granddaughter looks better today.  He is also being managed for possible COPD exacerbation.  It is hard to keep him awake but he is not in distress lying flat on 2 L.  For now no other change in therapy.    Tachycardia:  Runs of MAT.  No symptoms.  No change in therapy.  Call Mr. Hallenbeck with the results and send results to Blane Ohara, MD   Rollene Rotunda  4:39 PM  04/26/2023

## 2023-04-26 NOTE — Assessment & Plan Note (Addendum)
Per family and chart review, pt has a history of falls and previously seen by palliative care in an outpatient setting, last appointment around March 2024. Lives in a tiny home in daughter's backyard. Per daughter, pt desires full code at this time. Pt unable to participate fully in code status conversation, appears to have element of cognitive decline of aging vs dementia though no documented diagnosis.  Daughter, [name], is HCPOA but all children are very actively involved in their father's care. Kim manages his medications, which were confirmed today. --palliative consult to help discern GOC with pt and family --PT/OT eval and treat --RD consult to assess nutrition status --discuss code status with patient once more stable

## 2023-04-26 NOTE — Consult Note (Addendum)
WOC Nurse Consult Note: Reason for Consult: Consult requested for left hand.  Pt fell prior to admission and has abrasions to anterior area and and palm of hand.  Anterior hand with linear abrasion; 3X.1X.2cm, red and moist Palm of hand 1X1X.2cm, red and moist Palm of hand with dark red-purple bruise .5X.5cm Dressing procedure/placement/frequency: Topical treatment orders provided for bedside nurses to perform as follows to promote healing: Foam dressing to left hand, change Q 3 days or PRN soiling. Please re-consult if further assistance is needed.  Thank-you,  Cammie Mcgee MSN, RN, CWOCN, [place], CNS [phone]

## 2023-04-26 NOTE — Progress Notes (Signed)
Heart Failure Navigator Progress Note  Assessed for Heart & Vascular TOC clinic readiness.  Patient does not meet criteria due to Advanced Heart Failure patient (Dr. Shirlee Latch).   Navigator will sign off at this time.  Roxy Horseman, RN, BSN [facility] Heart Failure Navigator Secure Chat Only

## 2023-04-26 NOTE — Progress Notes (Signed)
Daily Progress Note Intern Pager: [phone]  Patient name: [name] Medical record number: [id number] Date of birth: [birth year] Age: 82 y.o. Gender: male  Primary Care Provider: Blane Ohara, MD Consultants: heart failure Code Status: full  Pt Overview and Major Events to Date:  10/13: admitted to FMTS  Assessment and Plan: [name] is a 82 y.o. male presenting with 3 weeks of cough and shortness of breath being treated with lasix for CHF exacerbation and azithromycin and prednisone for likely COPD exacerbation. Heart failure team reviewed plan on admission and was in agreement, will place formal consult today. Pt with UOP <1L and ongoing respiratory difficulties and signs of volume overload today. Will continue to diurese with IV lasix and consult heart failure as well as add LABA/LAMA inhaler today.  Assessment & Plan Acute exacerbation of CHF (congestive heart failure) (HCC) Dyspnea and orthopnea with peripheral edema and bibasilar crackles on exam and BNP 1878. Most recent echo in Oct 2023 with EF 25-30%. Failed outpatient diuresis with PO lasix, though was only taking 20mg  BID vs prescribed 40mg  BID from PCP note on 10/1. TSH WNL, A1c 6.5. S/p 2x Lasix 40mg  IV yesterday wit UOP. --Lasix 80mg  IV today --formal heart failure consult today --continue home digoxin, metoprolol --repeat Echo --AM BMP --strict I/Os, daily weights  EKG abnormality Per chart review, had atrial tachycardia in 2023 that was thought to be Afib. Does not appear to have been on medications for Afib in the past. Has been tachycardic since admission with EKG suggesting multifocal atrial tachycardia vs afib overnight.  --formal heart failure consult today --continuous telemetry COPD exacerbation (HCC) Meets 3/3 cardinal symptoms- increased purulence, sputum volume, dyspnea. CXR with increased pulmonary vascular congestion and rhonchorous lung sounds. COVID/flu/RSV and RVP negative. Most  recent PFTs with FEV1/FVC of 67.5% in 2021. Not on any home inhalers. Wheezing overnight, added Xopenex inhaler. --add Anoro ellipta inhaler --continue azithromycin 500mg  PO x 2 more days --continue prednisone 40mg  x4 more days --tessalon 200mg  PRN --continuous pulse ox, maintain saturation 88-92% --AM CBC Frailty Per family and chart review, pt has a history of falls and previously seen by palliative care in an outpatient setting, last appointment around March 2024. Lives in a tiny home in daughter's backyard. Per daughter, pt desires full code at this time. Pt unable to participate fully in code status conversation, appears to have element of cognitive decline of aging vs dementia though no documented diagnosis.  Daughter, [name], is HCPOA but all children are very actively involved in their father's care. Kim manages his medications, which were confirmed today. --palliative consult to help discern GOC with pt and family --PT/OT eval and treat --RD consult to assess nutrition status --discuss code status with patient once more stable  Benign prostatic hyperplasia with lower urinary tract symptoms (Resolved: 04/26/2023) Reported difficulty with urination lately. UOP overnight. -If decreased urine output, would consider adding tamsulosin 0.4 mg -Monitor for signs of urinary retention, bladder scans if needed   Chronic and Stable Issues: CAD s/p CABG x4: plavix 75mg  daily T2DM: euglycemic, holding home metformin, consider SSI based on CBG HLD: zetia BPH: UOP overnight, consider adding flomax 0.4mg  if having difficulty  FEN/GI: carb modified PPx: lovenox Dispo: pending clinical improvement  Subjective:  Patient states he is still not feeling great today.  Family is not present in the room, patient states he is not sure where they are today.  Patient coughs several times during interaction.  Increased  oxygen from 1 to 2 L due to apparent increased work of  breathing.  Objective: Temp:  [97.9 F (36.6 C)-99.3 F (37.4 C)] 99.1 F (37.3 C) (10/14 0356) Pulse Rate:  [24-118] 101 (10/14 0356) Resp:  [17-35] 18 (10/14 0356) BP: (98-125)/(64-98) 102/73 (10/14 0356) SpO2:  [90 %-100 %] 94 % (10/14 0356) Weight:  [70.5 kg-72.6 kg] 70.5 kg (10/14 0356) Physical Exam: General: Elderly, frail-appearing male sitting up in bed in no acute distress Cardiovascular: Tachycardic, regular rhythm, normal S1/S2 Respiratory: Speaking in short sentences, increased work of breathing on 1 L [place], coarse breath sounds at anterior lung fields, exam limited by patient inability to lean forward Abdomen: Normoactive bowel sounds, soft, nontender, nondistended Extremities: 1+ pitting edema to distal L calf/foot, trace edema to right distal calf/foot  Laboratory: Most recent CBC Lab Results  Component Value Date   WBC 9.5 04/26/2023   HGB 9.2 (L) 04/26/2023   HCT 27.2 (L) 04/26/2023   MCV 95.8 04/26/2023   PLT 206 04/26/2023   Most recent BMP    Latest Ref Rng & Units 04/26/2023    3:23 AM  BMP  Glucose 70 - 99 mg/dL 956   BUN 8 - 23 mg/dL 23   Creatinine 2.13 - 1.24 mg/dL 0.86   Sodium 578 - 469 mmol/L 132   Potassium 3.5 - 5.1 mmol/L 3.9   Chloride 98 - 111 mmol/L 100   CO2 22 - 32 mmol/L 22   Calcium 8.9 - 10.3 mg/dL 8.9      Imaging/Diagnostic Tests:  EKG: irregularly irregular rate and rhythm, minimally discernable p waves  Lorayne Bender, MD 04/26/2023, 7:19 AM  PGY-1, [facility] FPTS Intern pager: [phone], text pages welcome Secure chat group [facility] Teaching Service

## 2023-04-26 NOTE — Discharge Instructions (Signed)

## 2023-04-26 NOTE — Assessment & Plan Note (Addendum)
Per chart review, had atrial tachycardia in 2023 that was thought to be Afib. Does not appear to have been on medications for Afib in the past. Has been tachycardic since admission with EKG suggesting multifocal atrial tachycardia vs afib overnight.  --formal heart failure consult today --continuous telemetry

## 2023-04-26 NOTE — Evaluation (Signed)
Occupational Therapy Evaluation Patient Details Name: [name] MRN: [id number] DOB: [birth year] Today's Date: 04/26/2023   History of Present Illness Pt is an 82 y.o. male presenting 10/13 from home with SOB 1 month. CXR negative for PNA. Admitted with acute on chronic CHF exacerbation. PMH includes: CAD s/p CABG, COPD, HLD, and DM II. CT cervical spine 2018: Severe degenerative disc disease at C6-7.   Clinical Impression   PTA, pt lived alone and family frequently assisted with ADL and IADL. Upon eval, pt with profound weakness, decreased balance, coordination, and cognition. Pt needing up to max A for SPT to chair, total A for LB ADL, and max A for UB ADL. Pt with BUE decr shoulder mobility that he reports has been going on for a good amount of time, but reports he is typically able to feed self. Pt needing min A and AE today. Will continue to follow. Patient will benefit from continued inpatient follow up therapy, <3 hours/day       If plan is discharge home, recommend the following: Two people to help with walking and/or transfers;Two people to help with bathing/dressing/bathroom;Direct supervision/assist for financial management;Help with stairs or ramp for entrance;Assist for transportation;Assistance with feeding;Direct supervision/assist for medications management;Assistance with cooking/housework    Functional Status Assessment  Patient has had a recent decline in their functional status and demonstrates the ability to make significant improvements in function in a reasonable and predictable amount of time.  Equipment Recommendations  Other (comment) (defer)    Recommendations for Other Services       Precautions / Restrictions Precautions Precautions: Fall Restrictions Weight Bearing Restrictions: No      Mobility Bed Mobility Overal bed mobility: Needs Assistance Bed Mobility: Supine to Sit, Sit to Supine     Supine to sit: HOB elevated, Used rails, Max  assist Sit to supine: Mod assist   General bed mobility comments: step by step cues    Transfers Overall transfer level: Needs assistance Equipment used: Rolling walker (2 wheels) Transfers: Sit to/from Stand, Bed to chair/wheelchair/BSC Sit to Stand: Mod assist, From elevated surface Stand pivot transfers: Max assist         General transfer comment: strong posterior lean; max A and multimodal cues with face to face transfer to chair      Balance Overall balance assessment: Needs assistance Sitting-balance support: Single extremity supported, Bilateral upper extremity supported, Feet supported Sitting balance-Leahy Scale: Poor Sitting balance - Comments: posterior lean, minA   Standing balance support: Bilateral upper extremity supported, Reliant on assistive device for balance Standing balance-Leahy Scale: Poor Standing balance comment: mod-maxA                           ADL either performed or assessed with clinical judgement   ADL Overall ADL's : Needs assistance/impaired Eating/Feeding: Minimal assistance;Sitting;With adaptive utensils Eating/Feeding Details (indicate cue type and reason): In supported sititng in recliner. Pt with decr shoulder flexion requiring max positioning to be able to appropriately reach food tray. pt with decr vision affecting ability to load fork or spoon and neeidng glasses donned. Some improvement but continues with difficulty. Tremor present; initially trialed built up grip but pt with limited improvement. Upgraded to built up grip and decr tremor but poor proprioception and motor control secondary to weakness Grooming: Minimal assistance;Sitting Grooming Details (indicate cue type and reason): supported Upper Body Bathing: Maximal assistance;Sitting   Lower Body Bathing: Sit to/from stand;Total assistance   Upper  Body Dressing : Maximal assistance;Sitting   Lower Body Dressing: Total assistance;Sit to/from stand   Toilet  Transfer: Maximal assistance;Stand-pivot Toilet Transfer Details (indicate cue type and reason): Mod A and cues for STS up to RW; unable to progress LE; sat and performed face to face transfer with pt needinmg max A to maintain balance as well as progress LE         Functional mobility during ADLs: Maximal assistance       Vision Baseline Vision/History: 1 Wears glasses Ability to See in Adequate Light: 0 Adequate Patient Visual Report: No change from baseline Vision Assessment?: Vision impaired- to be further tested in functional context Additional Comments: Pt with max difficulty spearing food on plate; donned glasses and pt with improvement.     Perception Perception: Not tested       Praxis Praxis: Not tested       Pertinent Vitals/Pain Pain Assessment Pain Assessment: No/denies pain     Extremity/Trunk Assessment Upper Extremity Assessment Upper Extremity Assessment: Generalized weakness;Right hand dominant;RUE deficits/detail;LUE deficits/detail RUE Deficits / Details: BUE shoulder decr AROM 0-10. BUE tremor, profound weakness. LUE Deficits / Details: BUE shoulder decr AROM 0-10. BUE tremor, profound weakness.   Lower Extremity Assessment Lower Extremity Assessment: Defer to PT evaluation   Cervical / Trunk Assessment Cervical / Trunk Assessment: Kyphotic   Communication Communication Communication: Hearing impairment Cueing Techniques: Verbal cues   Cognition Arousal: Alert Behavior During Therapy: Flat affect Overall Cognitive Status: Impaired/Different from baseline Area of Impairment: Following commands, Safety/judgement, Awareness, Problem solving, Attention, Memory                   Current Attention Level: Sustained Memory: Decreased short-term memory Following Commands: Follows one step commands with increased time, Follows multi-step commands with increased time Safety/Judgement: Decreased awareness of safety, Decreased awareness of  deficits Awareness: Intellectual Problem Solving: Slow processing, Decreased initiation, Difficulty sequencing General Comments: Slow processing, needing up to mod cues for attention and initiation. pleasant and conversational generally, but seems to have decr conversation as compared to baseline.     General Comments  HR 110-130    Exercises     Shoulder Instructions      Home Living Family/patient expects to be discharged to:: Private residence Living Arrangements: Children Available Help at Discharge: Family;Available PRN/intermittently Type of Home: House Home Access: Ramped entrance;Stairs to enter     Home Layout: One level     Bathroom Shower/Tub: Producer, television/film/video: Standard     Home Equipment: Agricultural consultant (2 wheels);Cane - single point          Prior Functioning/Environment Prior Level of Function : Independent/Modified Independent;History of Falls (last six months)             Mobility Comments: pt reports ambulating with a RW most recently, history of falls ADLs Comments: Pt reports assist with all IADL. Family attempts to stay with him at night and assists with most ADL        OT Problem List: Decreased strength;Impaired balance (sitting and/or standing);Decreased activity tolerance;Impaired vision/perception;Decreased coordination;Decreased cognition;Impaired UE functional use;Cardiopulmonary status limiting activity      OT Treatment/Interventions: Self-care/ADL training;Therapeutic exercise;DME and/or AE instruction;Balance training;Patient/family education;Therapeutic activities;Cognitive remediation/compensation    OT Goals(Current goals can be found in the care plan section) Acute Rehab OT Goals Patient Stated Goal: move better OT Goal Formulation: With patient Time For Goal Achievement: 05/10/23 Potential to Achieve Goals: Good  OT Frequency: Min 1X/week  Co-evaluation              AM-PAC OT "6 Clicks" Daily  Activity     Outcome Measure Help from another person eating meals?: A Little Help from another person taking care of personal grooming?: A Lot Help from another person toileting, which includes using toliet, bedpan, or urinal?: A Lot Help from another person bathing (including washing, rinsing, drying)?: A Lot Help from another person to put on and taking off regular upper body clothing?: A Lot Help from another person to put on and taking off regular lower body clothing?: Total 6 Click Score: 12   End of Session Equipment Utilized During Treatment: Gait belt;Rolling walker (2 wheels) Nurse Communication: Mobility status  Activity Tolerance: Patient tolerated treatment well Patient left: in chair;with call bell/phone within reach;with chair alarm set;with family/visitor present;with nursing/sitter in room  OT Visit Diagnosis: Unsteadiness on feet (R26.81);Muscle weakness (generalized) (M62.81);Other symptoms and signs involving cognitive function;Feeding difficulties (R63.3)                Time: 8295-6213 OT Time Calculation (min): 26 min Charges:  OT General Charges $OT Visit: 1 Visit OT Evaluation $OT Eval Moderate Complexity: 1 Mod OT Treatments $Self Care/Home Management : 8-22 mins  Tyler Deis, OTR/L [facility] Acute Rehabilitation Office: [phone]   Myrla Halsted 04/26/2023, 2:03 PM

## 2023-04-26 NOTE — Progress Notes (Signed)
  Echocardiogram 2D Echocardiogram has been performed.  Milda Smart 04/26/2023, 2:38 PM

## 2023-04-26 NOTE — Evaluation (Signed)
Physical Therapy Evaluation Patient Details Name: [name] MRN: [id number] DOB: 07-03-1941 Today's Date: 04/26/2023  History of Present Illness  Pt is an 82 y.o. male presenting 10/13 from home with SOB 1 month. CXR negative for PNA. Admitted with acute on chronic CHF exacerbation. PMH includes: CAD s/p CABG, COPD, HLD, and DM II. CT cervical spine 2018: Severe degenerative disc disease at C6-7.  Clinical Impression  Pt presents to PT with deficits in functional mobility, gait, balance, strength, power, cognition, endurance. Pt fatigues rather quickly during session, standing three times but unable to ambulate. Pt demonstrates a persistent posterior lean in sitting and standing, placing him at a high risk for falls. Pt will benefit from continued aggressive mobilization in an effort to reduce falls risk and to restore his prior level of function. PT recommends short term inpatient PT services at the time of discharge.        If plan is discharge home, recommend the following: A lot of help with walking and/or transfers;A lot of help with bathing/dressing/bathroom;Assistance with cooking/housework;Assistance with feeding;Direct supervision/assist for medications management;Direct supervision/assist for financial management;Assist for transportation;Help with stairs or ramp for entrance;Supervision due to cognitive status   Can travel by private vehicle   No    Equipment Recommendations Wheelchair (measurements PT);Wheelchair cushion (measurements PT);BSC/3in1  Recommendations for Other Services       Functional Status Assessment Patient has had a recent decline in their functional status and demonstrates the ability to make significant improvements in function in a reasonable and predictable amount of time.     Precautions / Restrictions Precautions Precautions: Fall Restrictions Weight Bearing Restrictions: No      Mobility  Bed Mobility Overal bed mobility: Needs  Assistance Bed Mobility: Supine to Sit, Sit to Supine     Supine to sit: HOB elevated, Used rails, Max assist Sit to supine: Max assist        Transfers Overall transfer level: Needs assistance Equipment used: Rolling walker (2 wheels) Transfers: Sit to/from Stand Sit to Stand: Mod assist, From elevated surface           General transfer comment: strong posterior lean    Ambulation/Gait             Pre-gait activities: pt attempts to march in place, weight shifts laterally but not enough to clear either LE to step    Stairs            Wheelchair Mobility     Tilt Bed    Modified Rankin (Stroke Patients Only)       Balance Overall balance assessment: Needs assistance Sitting-balance support: Single extremity supported, Bilateral upper extremity supported, Feet supported Sitting balance-Leahy Scale: Poor Sitting balance - Comments: posterior lean, minA   Standing balance support: Bilateral upper extremity supported, Reliant on assistive device for balance Standing balance-Leahy Scale: Poor Standing balance comment: mod-maxA                             Pertinent Vitals/Pain Pain Assessment Pain Assessment: No/denies pain    Home Living Family/patient expects to be discharged to:: Private residence Living Arrangements: Children Available Help at Discharge: Family;Available PRN/intermittently Type of Home: House Home Access: Ramped entrance;Stairs to enter       Home Layout: One level Home Equipment: Agricultural consultant (2 wheels);Cane - single point      Prior Function Prior Level of Function : Independent/Modified Independent;History of Falls (last six months)  Mobility Comments: pt reports ambulating with a RW most recently, history of falls       Extremity/Trunk Assessment   Upper Extremity Assessment Upper Extremity Assessment: Generalized weakness    Lower Extremity Assessment Lower Extremity  Assessment: Generalized weakness    Cervical / Trunk Assessment Cervical / Trunk Assessment: Kyphotic  Communication   Communication Communication: Hearing impairment Cueing Techniques: Verbal cues  Cognition Arousal: Alert Behavior During Therapy: Flat affect Overall Cognitive Status: Impaired/Different from baseline Area of Impairment: Following commands, Safety/judgement, Awareness, Problem solving, Attention, Memory                   Current Attention Level: Sustained Memory: Decreased short-term memory Following Commands: Follows one step commands with increased time, Follows multi-step commands with increased time Safety/Judgement: Decreased awareness of safety, Decreased awareness of deficits Awareness: Intellectual Problem Solving: Slow processing, Decreased initiation, Difficulty sequencing          General Comments General comments (skin integrity, edema, etc.): pt with HR 118-130 during session, SpO2 in mid-90s on 1L at rest. Pt does temporarily desaturate to 80s when mobilizing on room air    Exercises     Assessment/Plan    PT Assessment Patient needs continued PT services  PT Problem List Decreased strength;Decreased activity tolerance;Decreased balance;Decreased mobility;Decreased knowledge of use of DME;Decreased safety awareness;Decreased knowledge of precautions       PT Treatment Interventions DME instruction;Gait training;Functional mobility training;Therapeutic activities;Therapeutic exercise;Balance training;Neuromuscular re-education;Cognitive remediation;Patient/family education    PT Goals (Current goals can be found in the Care Plan section)  Acute Rehab PT Goals Patient Stated Goal: to return to ambulation PT Goal Formulation: With patient Time For Goal Achievement: 05/10/23 Potential to Achieve Goals: Fair    Frequency Min 3X/week     Co-evaluation               AM-PAC PT "6 Clicks" Mobility  Outcome Measure Help needed  turning from your back to your side while in a flat bed without using bedrails?: A Lot Help needed moving from lying on your back to sitting on the side of a flat bed without using bedrails?: A Lot Help needed moving to and from a bed to a chair (including a wheelchair)?: A Lot Help needed standing up from a chair using your arms (e.g., wheelchair or bedside chair)?: A Lot Help needed to walk in hospital room?: Total Help needed climbing 3-5 steps with a railing? : Total 6 Click Score: 10    End of Session Equipment Utilized During Treatment: Gait belt Activity Tolerance: Patient tolerated treatment well Patient left: in bed;with call bell/phone within reach;with bed alarm set Nurse Communication: Mobility status;Need for lift equipment PT Visit Diagnosis: Other abnormalities of gait and mobility (R26.89);Muscle weakness (generalized) (M62.81);History of falling (Z91.81)    Time: 1027-2536 PT Time Calculation (min) (ACUTE ONLY): 33 min   Charges:   PT Evaluation $PT Eval Low Complexity: 1 Low PT Treatments $Therapeutic Activity: 8-22 mins PT General Charges $$ ACUTE PT VISIT: 1 Visit         Arlyss Gandy, PT, DPT Acute Rehabilitation Office [phone]   Arlyss Gandy 04/26/2023, 9:47 AM

## 2023-04-26 NOTE — Progress Notes (Addendum)
Called patient's daughter [name], who is his medical power of attorney, to give updates and discuss plan for patient's treatment.  Reviewed patient's current status and treatment plan including diuresis with Lasix and consultation by the heart failure team.  Also discussed palliative care consultation.  [name] states that patient was being seen by palliative care in an outpatient setting and that he was discharged because he was doing so well.  Kim agreed that getting palliative care on board would be helpful.  [name] states that patient has had a decline ever since his "sugars get out of control" and she has noticed that his he has not been acting like himself since then.  Can assess whether it is my medical opinion that the patient will return to his baseline after this hospitalization.  Stated that while I believe the patient will improve from an acute standpoint, I am not confident that he will be able to go back to where he was prior to his health taking a turn for the worse.

## 2023-04-26 NOTE — Assessment & Plan Note (Addendum)
Meets 3/3 cardinal symptoms- increased purulence, sputum volume, dyspnea. CXR with increased pulmonary vascular congestion and rhonchorous lung sounds. COVID/flu/RSV and RVP negative. Most recent PFTs with FEV1/FVC of 67.5% in 2021. Not on any home inhalers. Wheezing overnight, added Xopenex inhaler. --add Anoro ellipta inhaler --continue azithromycin 500mg  PO x 2 more days --continue prednisone 40mg  x4 more days --tessalon 200mg  PRN --continuous pulse ox, maintain saturation 88-92% --AM CBC

## 2023-04-26 NOTE — Assessment & Plan Note (Signed)
Per family and chart review, pt has a history of falls and previously seen by palliative care in an outpatient setting, last appointment around March 2024. Lives in a tiny home in daughter's backyard and has help with medications. Per daughter, pt desires full code at this time. Pt unable to participate fully in code status conversation, appears to have element of cognitive decline of aging vs dementia though no documented diagnosis.  Daughter, [name], is HCPOA but all children are very actively involved in their father's care. Kim manages his medications, which were confirmed today. --PT/OT eval and treat --RD consult to assess nutrition status --discuss code status with patient once more stable --consider palliative consult to help discern GOC with pt and family

## 2023-04-26 NOTE — NC FL2 (Signed)
[place] MEDICAID FL2 LEVEL OF CARE FORM     IDENTIFICATION  Patient Name: [name] Birthdate: September 29, 1940 Sex: male Admission Date (Current Location): 04/25/2023  [facility] and [place] Number:  Producer, television/film/video and Address:  [facility], [address] [place], [place] [address]      Provider Number: [id number]  Attending Physician Name and Address:  Doreene Eland, MD  Relative Name and Phone Number:  [name] (Daughter) [phone]    Current Level of Care: Hospital Recommended Level of Care: Skilled Nursing Facility Prior Approval Number:    Date Approved/Denied:   PASRR Number: [id number]  Discharge Plan: SNF    Current Diagnoses: Patient Active Problem List   Diagnosis Date Noted   EKG abnormality 04/26/2023   COPD exacerbation (HCC) 04/26/2023   Frailty 04/26/2023   Malnutrition of moderate degree 04/26/2023   Acute exacerbation of CHF (congestive heart failure) (HCC) 04/25/2023   Atrial fibrillation (HCC) 04/25/2023   Sore throat 04/20/2023   Atelectasis of both lungs 04/20/2023   Acute cough 04/13/2023   Mental confusion 04/13/2023   Bacterial conjunctivitis of both eyes 02/21/2023   Physical deconditioning 02/21/2023   Frequent falls 02/21/2023   Urinary hesitancy 02/20/2023   Acute bronchitis due to Rhinovirus 01/20/2023   Encephalopathy acute 01/20/2023   Acute encephalopathy 01/19/2023   Pneumonia 01/19/2023   Muscular deconditioning 08/22/2022   Falls frequently 08/22/2022   Adult BMI <19 kg/sq m 03/03/2022   Mitral regurgitation 01/26/2022   SOB (shortness of breath) 01/24/2022   Multifocal atrial tachycardia (HCC) 01/24/2022   Fall at home 10/08/2021   Generalized weakness 11/14/2020   Statin myopathy 04/15/2020   Diabetic polyneuropathy (HCC) 12/05/2019   Type 2 diabetes mellitus with diabetic peripheral angiopathy without gangrene (HCC) 12/04/2019   Chronic obstructive pulmonary disease (HCC)  12/04/2019   Mixed hyperlipidemia 01/29/2017   Acute ITP (HCC) 01/26/2012   Hypertensive heart disease with CHF (HCC) 07/08/2010   DIVERTICULOSIS-COLON 05/12/2010   Diaphragmatic hernia 03/25/2010   Coronary artery disease of native artery of native heart with stable angina pectoris (HCC)     Orientation RESPIRATION BLADDER Height & Weight     Self, Situation, Time  O2 (2L) Continent Weight: 155 lb 6.8 oz (70.5 kg) Height:  5\' 10"  (177.8 cm)  BEHAVIORAL SYMPTOMS/MOOD NEUROLOGICAL BOWEL NUTRITION STATUS      Continent Diet (see discharge summary)  AMBULATORY STATUS COMMUNICATION OF NEEDS Skin   Extensive Assist Verbally Normal, Other (Comment) (Wound / Incision (Open or Dehisced) 04/26/23 Other (Comment) Hand Left full thickness abrasions)                       Personal Care Assistance Level of Assistance  Bathing, Feeding, Dressing Bathing Assistance: Maximum assistance Feeding assistance: Limited assistance Dressing Assistance: Maximum assistance     Functional Limitations Info  Sight, Hearing, Speech Sight Info: Impaired (wears glasses) Hearing Info: Adequate Speech Info: Adequate    SPECIAL CARE FACTORS FREQUENCY  PT (By licensed PT), OT (By licensed OT)     PT Frequency: 5x/week OT Frequency: 5x/week            Contractures Contractures Info: Not present    Additional Factors Info  Code Status, Allergies Code Status Info: FULL Allergies Info: Lipitor (Atorvastatin Calcium)  Crestor (Rosuvastatin Calcium)  Zocor (Simvastatin)           Current Medications (04/26/2023):  This is the current hospital active medication list Current Facility-Administered  Medications  Medication Dose Route Frequency Provider Last Rate Last Admin   acetaminophen (TYLENOL) tablet 650 mg  650 mg Oral Q6H PRN Hindel, Leah, MD   650 mg at 04/25/23 1940   Or   acetaminophen (TYLENOL) suppository 650 mg  650 mg Rectal Q6H PRN Hindel, Leah, MD       azithromycin (ZITHROMAX)  tablet 500 mg  500 mg Oral Daily Dameron, Marisa, DO   500 mg at 04/26/23 0803   benzonatate (TESSALON) capsule 200 mg  200 mg Oral TID PRN Hindel, Tacey Ruiz, MD   200 mg at 04/25/23 1942   clopidogrel (PLAVIX) tablet 75 mg  75 mg Oral Daily Hindel, Leah, MD   75 mg at 04/26/23 0804   digoxin (LANOXIN) tablet 0.125 mg  0.125 mg Oral Daily Hindel, Leah, MD   0.125 mg at 04/26/23 0804   enoxaparin (LOVENOX) injection 40 mg  40 mg Subcutaneous Q24H Hindel, Leah, MD   40 mg at 04/25/23 1948   ezetimibe (ZETIA) tablet 10 mg  10 mg Oral Daily Hindel, Leah, MD   10 mg at 04/26/23 0804   [START ON 04/27/2023] feeding supplement (ENSURE ENLIVE / ENSURE PLUS) liquid 237 mL  237 mL Oral BID BM Eniola, Kehinde T, MD       melatonin tablet 3 mg  3 mg Oral QHS Dameron, Marisa, DO       metoprolol succinate (TOPROL-XL) 24 hr tablet 12.5 mg  12.5 mg Oral Daily Hindel, Leah, MD   12.5 mg at 04/26/23 0804   multivitamin with minerals tablet 1 tablet  1 tablet Oral Daily Eniola, Kehinde T, MD       olopatadine (PATANOL) 0.1 % ophthalmic solution 1 drop  1 drop Both Eyes BID Hindel, Leah, MD   1 drop at 04/26/23 0806   predniSONE (DELTASONE) tablet 40 mg  40 mg Oral Q breakfast Hindel, Leah, MD   40 mg at 04/26/23 0804   umeclidinium-vilanterol (ANORO ELLIPTA) 62.5-25 MCG/ACT 1 puff  1 puff Inhalation Daily Darral Dash, DO         Discharge Medications: Please see discharge summary for a list of discharge medications.  Relevant Imaging Results:  Relevant Lab Results:   Additional Information SSN:[id number]  Sharolyn Weber A [place], [place]

## 2023-04-26 NOTE — TOC Benefit Eligibility Note (Signed)
Patient Product/process development scientist completed.    The patient is insured through [employer]. Patient has Medicare and is not eligible for a copay card, but may be able to apply for patient assistance, if available.    Ran test claim for Anoro Ellipta 62.5-25 mcg and the current 30 day co-pay is $116.73 due to being in Coverage Gap (donut hole).   This test claim was processed through [facility]- copay amounts may vary at other pharmacies due to pharmacy/plan contracts, or as the patient moves through the different stages of their insurance plan.     [name], CPHT Pharmacy Technician III Certified Patient Advocate [facility] Patient Advocate Team Direct Number: [phone]  Fax: [phone]

## 2023-04-27 DIAGNOSIS — E1165 Type 2 diabetes mellitus with hyperglycemia: Secondary | ICD-10-CM | POA: Insufficient documentation

## 2023-04-27 DIAGNOSIS — Z7189 Other specified counseling: Secondary | ICD-10-CM

## 2023-04-27 DIAGNOSIS — R54 Age-related physical debility: Secondary | ICD-10-CM

## 2023-04-27 DIAGNOSIS — J441 Chronic obstructive pulmonary disease with (acute) exacerbation: Secondary | ICD-10-CM

## 2023-04-27 DIAGNOSIS — E440 Moderate protein-calorie malnutrition: Secondary | ICD-10-CM

## 2023-04-27 DIAGNOSIS — Z515 Encounter for palliative care: Secondary | ICD-10-CM | POA: Diagnosis not present

## 2023-04-27 DIAGNOSIS — I4891 Unspecified atrial fibrillation: Secondary | ICD-10-CM

## 2023-04-27 DIAGNOSIS — R0602 Shortness of breath: Secondary | ICD-10-CM | POA: Diagnosis not present

## 2023-04-27 DIAGNOSIS — I5023 Acute on chronic systolic (congestive) heart failure: Secondary | ICD-10-CM

## 2023-04-27 DIAGNOSIS — R000 Tachycardia, unspecified: Secondary | ICD-10-CM | POA: Diagnosis not present

## 2023-04-27 LAB — BASIC METABOLIC PANEL
Anion gap: 12 (ref 5–15)
BUN: 25 mg/dL — ABNORMAL HIGH (ref 8–23)
CO2: 24 mmol/L (ref 22–32)
Calcium: 9.2 mg/dL (ref 8.9–10.3)
Chloride: 95 mmol/L — ABNORMAL LOW (ref 98–111)
Creatinine, Ser: 1.2 mg/dL (ref 0.61–1.24)
GFR, Estimated: 60 mL/min — ABNORMAL LOW (ref >60)
Glucose, Bld: 242 mg/dL — ABNORMAL HIGH (ref 70–99)
Potassium: 3.6 mmol/L (ref 3.5–5.1)
Sodium: 131 mmol/L — ABNORMAL LOW (ref 135–145)

## 2023-04-27 LAB — CBC
HCT: 28.1 % — ABNORMAL LOW (ref 39.0–52.0)
Hemoglobin: 9.5 g/dL — ABNORMAL LOW (ref 13.0–17.0)
MCH: 32.3 pg (ref 26.0–34.0)
MCHC: 33.8 g/dL (ref 30.0–36.0)
MCV: 95.6 fL (ref 80.0–100.0)
Platelets: 212 10*3/uL (ref 150–400)
RBC: 2.94 MIL/uL — ABNORMAL LOW (ref 4.22–5.81)
RDW: 13.8 % (ref 11.5–15.5)
WBC: 10.2 10*3/uL (ref 4.0–10.5)
nRBC: 0 % (ref 0.0–0.2)

## 2023-04-27 MED ORDER — FUROSEMIDE 10 MG/ML IJ SOLN
80.0000 mg | Freq: Once | INTRAMUSCULAR | Status: AC
  Administered 2023-04-27: 80 mg via INTRAVENOUS
  Filled 2023-04-27: qty 8

## 2023-04-27 MED ORDER — METFORMIN HCL 500 MG PO TABS
500.0000 mg | ORAL_TABLET | Freq: Two times a day (BID) | ORAL | Status: DC
  Administered 2023-04-27 – 2023-04-29 (×4): 500 mg via ORAL
  Filled 2023-04-27 (×4): qty 1

## 2023-04-27 MED ORDER — GUAIFENESIN 100 MG/5ML PO LIQD
5.0000 mL | Freq: Four times a day (QID) | ORAL | Status: DC | PRN
  Administered 2023-04-30 – 2023-05-03 (×3): 5 mL via ORAL
  Filled 2023-04-27 (×3): qty 10

## 2023-04-27 MED ORDER — PREDNISONE 20 MG PO TABS: 40.0000 mg | ORAL_TABLET | Freq: Every day | ORAL | Status: DC

## 2023-04-27 NOTE — Assessment & Plan Note (Addendum)
NYHA Class III-IV. Dyspnea and orthopnea with peripheral edema and bibasilar crackles on exam and BNP 1878. Most recent echo in Oct 2023 with EF 25-30%. Failed outpatient diuresis with PO lasix, though was only taking 20mg  BID vs prescribed 40mg  BID from PCP note on 10/1. TSH WNL, A1c 6.5. Repeat Echo (10/14): Left ventricular ejection fraction, by estimation, is 20 to 25%. Moderate to severe mitral valve regurgitation. Moderate tricuspid regurgitation.  --Lasix 80mg  IV today: urine output yesterday ~2L. Weight 70.5 kg today. Elevated from 68 kg from discharge on 7/11.   --heart failure consult: no new changes. resume vericiguat upon discharge.  --continue home digoxin, metoprolol --AM BMP, add Mg --strict I/Os, daily standing weights

## 2023-04-27 NOTE — Progress Notes (Signed)
Progress Note  Patient Name: [name] Date of Encounter: 04/27/2023  Primary Cardiologist:   Kristeen Miss, MD   Subjective   He is awake this morning but confused.  Says that he is breathing better. Still with a cough  Inpatient Medications    Scheduled Meds:  azithromycin  500 mg Oral Daily   clopidogrel  75 mg Oral Daily   digoxin  0.125 mg Oral Daily   enoxaparin (LOVENOX) injection  40 mg Subcutaneous Q24H   ezetimibe  10 mg Oral Daily   feeding supplement  237 mL Oral BID BM   melatonin  3 mg Oral QHS   metoprolol succinate  12.5 mg Oral Daily   multivitamin with minerals  1 tablet Oral Daily   olopatadine  1 drop Both Eyes BID   predniSONE  40 mg Oral Q breakfast   umeclidinium-vilanterol  1 puff Inhalation Daily   Continuous Infusions:  PRN Meds: acetaminophen **OR** acetaminophen, benzonatate   Vital Signs    Vitals:   04/27/23 0015 04/27/23 0146 04/27/23 0348 04/27/23 0747  BP: 110/71  102/78 98/66  Pulse: 100  98 99  Resp: (!) 21  17 20   Temp: 98.7 F (37.1 C)  98.7 F (37.1 C) 98.1 F (36.7 C)  TempSrc:    Oral  SpO2: 98%  91% 95%  Weight:  70.5 kg    Height:        Intake/Output Summary (Last 24 hours) at 04/27/2023 0846 Last data filed at 04/27/2023 1610 Gross per 24 hour  Intake 660 ml  Output 2075 ml  Net -1415 ml   Filed Weights   04/25/23 2224 04/26/23 0356 04/27/23 0146  Weight: 71 kg 70.5 kg 70.5 kg    Telemetry    NSR, PVCs with couplets - Personally Reviewed  ECG    NA - Personally Reviewed  Physical Exam   GEN: No acute distress.   Neck: No  JVD Cardiac: RRR, 3/6 apical systolic murmur at the apex, no diastolic murmurs, rubs, or gallops.  Respiratory: Clear  to auscultation bilaterally. GI: Soft, nontender, non-distended  MS:    Mild edema; No deformity. Neuro:  Nonfocal  Psych: Normal affect   Labs    Chemistry Recent Labs  Lab 04/20/23 1023 04/20/23 1023 04/25/23 1236 04/25/23 1256  04/26/23 0323 04/27/23 0357  NA 134   < > 132* 130* 132* 131*  K 4.9  --  5.0 4.9 3.9 3.6  CL 97  --  100  --  100 95*  CO2 22  --  19*  --  22 24  GLUCOSE 226*  --  244*  --  198* 242*  BUN 12  --  24*  --  23 25*  CREATININE 1.03  --  1.25*  --  1.05 1.20  CALCIUM 9.4  --  9.1  --  8.9 9.2  PROT 6.8  --  6.9  --   --   --   ALBUMIN 3.6*  --  2.9*  --   --   --   AST 21  --  43*  --   --   --   ALT 18  --  39  --   --   --   ALKPHOS 60  --  56  --   --   --   BILITOT 0.9  --  1.9*  --   --   --   GFRNONAA  --   --  57*  --  >  60 60*  ANIONGAP  --   --  13  --  10 12   < > = values in this interval not displayed.     Hematology Recent Labs  Lab 04/25/23 1236 04/25/23 1256 04/26/23 0323 04/27/23 0357  WBC 12.0*  --  9.5 10.2  RBC 3.10*  --  2.84* 2.94*  HGB 9.7* 9.9* 9.2* 9.5*  HCT 30.0* 29.0* 27.2* 28.1*  MCV 96.8  --  95.8 95.6  MCH 31.3  --  32.4 32.3  MCHC 32.3  --  33.8 33.8  RDW 13.8  --  13.9 13.8  PLT 253  --  206 212    Cardiac EnzymesNo results for input(s): "TROPONINI" in the last 168 hours. No results for input(s): "TROPIPOC" in the last 168 hours.   BNP Recent Labs  Lab 04/20/23 1023 04/25/23 1236  BNP  --  1,878.8*  PROBNP 5,629*  --      DDimer No results for input(s): "DDIMER" in the last 168 hours.   Radiology    ECHOCARDIOGRAM COMPLETE  Result Date: 04/26/2023    ECHOCARDIOGRAM REPORT   Patient Name:   SHELTON SQUARE Date of Exam: 04/26/2023 Medical Rec #:  [id number]        Height:       70.0 in Accession #:    [id number]       Weight:       155.4 lb Date of Birth:  Nov 01, 1940        BSA:          1.875 m Patient Age:    82 years         BP:           108/73 mmHg Patient Gender: M                HR:           118 bpm. Exam Location:  Inpatient Procedure: 2D Echo, Cardiac Doppler and Color Doppler Indications:    CAD, ischemic cardiomyopathy, mitral regurgitation  History:        Patient has prior history of Echocardiogram examinations.   Sonographer:    Milda Smart Referring Phys: MARISA DAMERON IMPRESSIONS  1. Left ventricular ejection fraction, by estimation, is 20 to 25%. The left ventricle has severely decreased function. The left ventricle demonstrates global hypokinesis. The left ventricular internal cavity size was mildly dilated. Left ventricular diastolic parameters are indeterminate.  2. Right ventricular systolic function is mildly reduced. The right ventricular size is normal. There is moderately elevated pulmonary artery systolic pressure. The estimated right ventricular systolic pressure is 55.2 mmHg.  3. Left atrial size was mildly dilated.  4. The mitral valve is abnormal. Moderate to severe mitral valve regurgitation. Suspect functional MR, posterior leaflet is restricted. No evidence of mitral stenosis.  5. The tricuspid valve is abnormal. Tricuspid valve regurgitation is moderate.  6. The aortic valve is tricuspid. Aortic valve regurgitation is mild. No aortic stenosis is present.  7. The inferior vena cava is dilated in size with <50% respiratory variability, suggesting right atrial pressure of 15 mmHg. FINDINGS  Left Ventricle: Left ventricular ejection fraction, by estimation, is 20 to 25%. The left ventricle has severely decreased function. The left ventricle demonstrates global hypokinesis. The left ventricular internal cavity size was mildly dilated. There is no left ventricular hypertrophy. Left ventricular diastolic parameters are indeterminate. Right Ventricle: The right ventricular size is normal. No increase in right ventricular wall thickness. Right ventricular systolic  function is mildly reduced. There is moderately elevated pulmonary artery systolic pressure. The tricuspid regurgitant velocity is 3.17 m/s, and with an assumed right atrial pressure of 15 mmHg, the estimated right ventricular systolic pressure is 55.2 mmHg. Left Atrium: Left atrial size was mildly dilated. Right Atrium: Right atrial size was normal  in size. Pericardium: There is no evidence of pericardial effusion. Mitral Valve: The mitral valve is abnormal. Moderate to severe mitral valve regurgitation. No evidence of mitral valve stenosis. Tricuspid Valve: The tricuspid valve is abnormal. Tricuspid valve regurgitation is moderate. Aortic Valve: The aortic valve is tricuspid. Aortic valve regurgitation is mild. No aortic stenosis is present. Pulmonic Valve: The pulmonic valve was normal in structure. Pulmonic valve regurgitation is trivial. Aorta: The aortic root is normal in size and structure. Venous: The inferior vena cava is dilated in size with less than 50% respiratory variability, suggesting right atrial pressure of 15 mmHg. IAS/Shunts: No atrial level shunt detected by color flow Doppler.  LEFT VENTRICLE PLAX 2D LVIDd:         6.20 cm      Diastology LVIDs:         5.50 cm      LV e' medial:  5.27 cm/s LV PW:         0.70 cm      LV e' lateral: 6.37 cm/s LV IVS:        0.80 cm LVOT diam:     2.30 cm LV SV:         43 LV SV Index:   23 LVOT Area:     4.15 cm  LV Volumes (MOD) LV vol d, MOD A2C: 120.0 ml LV vol d, MOD A4C: 104.0 ml LV vol s, MOD A2C: 95.1 ml LV vol s, MOD A4C: 80.5 ml LV SV MOD A2C:     24.9 ml LV SV MOD A4C:     104.0 ml LV SV MOD BP:      26.8 ml RIGHT VENTRICLE RV S prime:     8.24 cm/s TAPSE (M-mode): 0.9 cm LEFT ATRIUM             Index        RIGHT ATRIUM           Index LA diam:        4.80 cm 2.56 cm/m   RA Area:     13.40 cm LA Vol (A2C):   53.5 ml 28.53 ml/m  RA Volume:   27.80 ml  14.83 ml/m LA Vol (A4C):   53.7 ml 28.64 ml/m LA Biplane Vol: 53.8 ml 28.69 ml/m  AORTIC VALVE LVOT Vmax:   73.20 cm/s LVOT Vmean:  61.000 cm/s LVOT VTI:    0.104 m  AORTA Ao Root diam: 3.50 cm Ao Asc diam:  3.10 cm MR Peak grad:    56.9 mmHg    TRICUSPID VALVE MR Mean grad:    39.0 mmHg    TR Peak grad:   40.2 mmHg MR Vmax:         377.00 cm/s  TR Mean grad:   28.0 mmHg MR Vmean:        300.0 cm/s   TR Vmax:        317.00 cm/s MR PISA:          1.90 cm     TR Vmean:       256.0 cm/s MR PISA Eff ROA: 16 mm MR PISA Radius:  0.55 cm  SHUNTS                               Systemic VTI:  0.10 m                               Systemic Diam: 2.30 cm Dalton McleanMD Electronically signed by Wilfred Lacy Signature Date/Time: 04/26/2023/2:39:18 PM    Final    DG Chest Portable 1 View  Result Date: 04/25/2023 CLINICAL DATA:  82 year old male with shortness of breath. EXAM: PORTABLE CHEST 1 VIEW COMPARISON:  Chest radiographs 04/13/2023 and earlier. FINDINGS: Portable AP upright view at 1220 hours. Small pleural effusions are less apparent. Stable CABG, mediastinal contours. Stable pulmonary vascularity, no overt edema. No pneumothorax or consolidation. No acute osseous abnormality identified. Negative visible bowel gas. IMPRESSION: Small pleural effusions on 04/13/2023 are less apparent. No new cardiopulmonary abnormality. Electronically Signed   By: Odessa Fleming M.D.   On: 04/25/2023 13:29    Cardiac Studies   Echo:    1. Left ventricular ejection fraction, by estimation, is 20 to 25%. The  left ventricle has severely decreased function. The left ventricle  demonstrates global hypokinesis. The left ventricular internal cavity size  was mildly dilated. Left ventricular  diastolic parameters are indeterminate.   2. Right ventricular systolic function is mildly reduced. The right  ventricular size is normal. There is moderately elevated pulmonary artery  systolic pressure. The estimated right ventricular systolic pressure is  55.2 mmHg.   3. Left atrial size was mildly dilated.   4. The mitral valve is abnormal. Moderate to severe mitral valve  regurgitation. Suspect functional MR, posterior leaflet is restricted. No  evidence of mitral stenosis.   5. The tricuspid valve is abnormal. Tricuspid valve regurgitation is  moderate.   6. The aortic valve is tricuspid. Aortic valve regurgitation is mild. No  aortic stenosis is present.   7.  The inferior vena cava is dilated in size with <50% respiratory  variability, suggesting right atrial pressure of 15 mmHg.    Patient Profile     82 y.o. male with a hx of CAD s/p CABG 2007 maintained on Plavix, chronic HFrEF, moderate to severe MR and mild AI, HLD (intolerant of statins), multifocal atrial tach 01/2022, NSVT/PSVT/PACs/PVCs by monitor 09/2022, DM, COPD, BPH, hiatal hernia, thrombocytopenia, cystic lesion in upper abdomen by CT 01/2023 who is being seen 04/26/2023 for the evaluation of CHF at the request of Dr. Lum Babe.   Assessment & Plan    Acute on chronic HFrEF:  He has only tolerated metoprolol and low dose dig.  Resume vericiguat on discharge.    Net negative 2.2 liters this admission.  Still on steroids, antibiotics and bronchodilators.  I would suggest continued diuresis again today.     Tachycardia, suspected MAT:   Sinus with PVCs with couplets.  Tolerating low dose beta blocker but unable to titrate otherwise.  Continue to manage underlying lung issues    Moderate-severe mitral regurgitation, mild aortic insufficiency:    Medical management.  No other advanced or acute therapies to order.   CAD s/p CABG 2007:  Medical management.    AKI, hyponatremia:  Na is stable.  Creat is tolerating diuresis.    For questions or updates, please contact [facility] Please consult [email or web address] for contact info under Cardiology/STEMI.   Signed, Rollene Rotunda, MD  04/27/2023, 8:46 AM

## 2023-04-27 NOTE — Assessment & Plan Note (Addendum)
Per family and chart review, pt has a history of falls and previously seen by palliative care in an outpatient setting, last appointment around March 2024. Lives in a tiny home in daughter's backyard. Per daughter, pt desires full code at this time. Pt unable to participate fully in code status conversation, appears to have element of cognitive decline of aging vs dementia though no documented diagnosis.  Daughter, [name], is HCPOA but all children are very actively involved in their father's care. Kim manages his medications, which were confirmed today. --palliative consult to help discern GOC with pt and family: son will check with his sisters about their availability to meet and discuss code status as well as options for getting him home with hospice. Leaning towards DNR/DNI.  --PT/OT eval and treat, fall precautions

## 2023-04-27 NOTE — Assessment & Plan Note (Addendum)
Meets 3/3 cardinal symptoms- increased purulence, sputum volume, dyspnea. CXR with increased pulmonary vascular congestion and rhonchorous lung sounds. COVID/flu/RSV and RVP negative. Most recent PFTs with FEV1/FVC of 67.5% in 2021. Not on any home inhalers or O2. Wheezing overnight on 10/13 and added Xopenex inhaler. --Anoro ellipta inhaler --azithromycin 500mg  PO Day 3/3 --prednisone 40mg  Day 2/3 --tessalon 200mg  PRN --guaiFENesin PRN --continuous pulse ox, maintain saturation 88-92%, wean off O2 as tolerated --AM CBC

## 2023-04-27 NOTE — Assessment & Plan Note (Addendum)
CBG elevated at 190-244. Not meeting goal 140-180. Outpatient on metformin 500 mg TID.  - transition to metformin 500 mg BID

## 2023-04-27 NOTE — Consult Note (Signed)
Consultation Note Date: 04/27/2023   Patient Name: [name]  DOB: 1941/04/14  MRN: [id number]  Age / Sex: 82 y.o., male  PCP: Blane Ohara, MD Referring Physician: Doreene Eland, MD  Reason for Consultation: Establishing goals of care  HPI/Patient Profile: 82 y.o. male  with past medical history of CHF, COPD, CAD, HTN, HLD, ITP and DM  admitted on 04/25/2023 with cough and SOB for 3-4 weeks.   Patient is admitted with acute hypoxic respiratory failure, acute CHF exacerbation and COPD exacerbation.  He has been previously seen by palliative care in an outpatient setting, last appointment around March 2024. PMT has been consulted to assist with goals of care conversation.  Clinical Assessment and Goals of Care:  I have reviewed medical records including EPIC notes, labs and imaging, assessed the patient and then met at the bedside with patient's son [name] to discuss diagnosis prognosis, GOC, EOL wishes, disposition and options. We then met again later in the afternoon with the addition of patient's daughters [name] and Misty Stanley.    I introduced Palliative Medicine as specialized medical care for people living with serious illness. It focuses on providing relief from the symptoms and stress of a serious illness. The goal is to improve quality of life for both the patient and the family.  We discussed a brief life review of the patient and then focused on their current illness.  The natural disease trajectory and expectations at EOL were discussed.  I attempted to elicit values and goals of care important to the patient.    Medical History Review and Understanding:  Patient's son has a good understanding of the severity of patient's illness after discussing with primary team and cardiology.   Social History: Patient is widowed. He has 2 daughters and 1 son, all living close by. He lives in a tiny home on his  daughter's property with frequent check ins. He is a [place] of strong faith. Unfortunately, he has not been able to attend church in the past 5-6 weeks due to his worsening health.  Functional and Nutritional State: Patient ambulates with a walker, falling more frequently and most recently within the past 2 weeks.  Palliative Symptoms: Cough, dyspnea  Advance Directives: A detailed discussion regarding advanced directives was had. Patient's daughter [name] is reported HCPOA. No documentation currently on file.   Code Status: Concepts specific to code status, artifical feeding and hydration, and rehospitalization were considered and discussed. Recommended consideration of DNR status, understanding evidenced-based poor outcomes in similar hospitalized patients, as the cause of the arrest is likely associated with chronic/terminal disease rather than a reversible acute cardio-pulmonary event.   Discussion: Patient's son shares his clear understanding of limited treatment options and patient's poor prognosis with or without ongoing medical interventions. His priority at this time is ensuring patient is comfortable, ideally in his own home as SNF is not an option. Patient came home worse off than before when he went to SNF in July. He shares the family's hope that patient can live out his remaining time in his own home, even if a bed needs to be delivered. We briefly discussed hospice philosophy given goals of care, as well as typical resources and possible need for additional caregivers with hospice not providing 24/7 care in the home setting. [name] would like to discuss this further with his siblings present. We privately discussed prognosis of weeks to months and Tommy shared that he would call PMT phone line with preferred date/time for family meeting  pending his sisters' available. I later received voicemail requesting a meeting at 3:30pm later today. Returned to the bedside for scheduled family  meeting.  Reviewed the difference between palliative care and hospice, as well as hospice resources available in different settings. While they are currently aligned with hospice philosophy, they are very concerned about the ability to support patient 24/7 in the home setting.  After deliberation and review of the SNF referral process, family is in agreement that it would be best to proceed with SNF for short-term rehab as they make other arrangements.  MOST form was introduced and recommended to complete in anticipation of discharge soon.  Outpatient palliative care was explained and offered for ongoing goals of care discussions and transition to hospice once additional arrangements have been made.  Family are agreeable.   The difference between aggressive medical intervention and comfort care was considered in light of the patient's goals of care. Hospice and Palliative Care services outpatient were explained and offered.   Discussed the importance of continued conversation with family and the medical providers regarding overall plan of care and treatment options, ensuring decisions are within the context of the patient's values and GOCs.   Questions and concerns were addressed.  Hard Choices booklet left for review. The family was encouraged to call with questions or concerns.  PMT will continue to support holistically.   SUMMARY OF RECOMMENDATIONS   -Continue full code for now.  Patient's original agreement with DNR/DNI but would like to discuss with patient further per his request -Continue current care -Family have decided on SNF placement for short-term rehab.  Goal is to make additional arrangements for caregiver support in the home setting if possible, versus transition to long-term care facility with hospice -[facility] consulted for assistance with outpatient palliative care referral and HCPOA's insurance questions -MOST form introduced today -Psychosocial and emotional support provided -PMT  will continue to follow and support  Prognosis:  Weeks to months  Discharge Planning: Skilled Nursing Facility for rehab with Palliative care service follow-up      Primary Diagnoses: Present on Admission:  (Resolved) Benign prostatic hyperplasia with lower urinary tract symptoms  SOB (shortness of breath)   Physical Exam Vitals and nursing note reviewed.  Constitutional:      General: He is not in acute distress.    Appearance: He is ill-appearing.  Cardiovascular:     Rate and Rhythm: Tachycardia present. Rhythm irregular.  Pulmonary:     Effort: Pulmonary effort is normal.  Skin:    General: Skin is warm and dry.  Neurological:     Mental Status: He is alert.     Motor: Weakness present.     Vital Signs: BP 98/66 (BP Location: Right Arm)   Pulse 99   Temp 98.1 F (36.7 C) (Oral)   Resp 20   Ht 5\' 10"  (1.778 m)   Wt 70.5 kg   SpO2 95%   BMI 22.30 kg/m  Pain Scale: 0-10   Pain Score: 0-No pain   SpO2: SpO2: 95 % O2 Device:SpO2: 95 % O2 Flow Rate: .O2 Flow Rate (L/min): 2 L/min   Palliative Assessment/Data:      Total time: I spent 100 minutes in the care of the patient today in the above activities and documenting the encounter.  MDM: High   Leondro Coryell Jeni Salles, PA-C  Palliative Medicine Team Team phone # [phone]  Thank you for allowing the Palliative Medicine Team to assist in the care of this patient. Please utilize  secure chat with additional questions, if there is no response within 30 minutes please call the above phone number.  Palliative Medicine Team providers are available by phone from 7am to 7pm daily and can be reached through the team cell phone.  Should this patient require assistance outside of these hours, please call the patient's attending physician.

## 2023-04-27 NOTE — Progress Notes (Signed)
Daily Progress Note Intern Pager: [phone]  Patient name: [name] Medical record number: [id number] Date of birth: May 11, 1941 Age: 82 y.o. Gender: male  Primary Care Provider: Blane Ohara, MD Consultants: heart failure, palliative Code Status: full   Pt Overview and Major Events to Date:  10/13: admitted to FMTS   Assessment and Plan: [name] is a 82 y.o. male presenting with 3 weeks of cough and shortness of breath 2/2 CHF and COPD exacerbation.   Assessment & Plan Acute exacerbation of CHF (congestive heart failure) (HCC) NYHA Class III-IV. Dyspnea and orthopnea with peripheral edema and bibasilar crackles on exam and BNP 1878. Most recent echo in Oct 2023 with EF 25-30%. Failed outpatient diuresis with PO lasix, though was only taking 20mg  BID vs prescribed 40mg  BID from PCP note on 10/1. TSH WNL, A1c 6.5. Repeat Echo (10/14): Left ventricular ejection fraction, by estimation, is 20 to 25%. Moderate to severe mitral valve regurgitation. Moderate tricuspid regurgitation.  --Lasix 80mg  IV today: urine output yesterday ~2L. Weight 70.5 kg today. Elevated from 68 kg from discharge on 7/11.   --heart failure consult: no new changes. resume vericiguat upon discharge.  --continue home digoxin, metoprolol --AM BMP, add Mg --strict I/Os, daily standing weights  COPD exacerbation (HCC) Meets 3/3 cardinal symptoms- increased purulence, sputum volume, dyspnea. CXR with increased pulmonary vascular congestion and rhonchorous lung sounds. COVID/flu/RSV and RVP negative. Most recent PFTs with FEV1/FVC of 67.5% in 2021. Not on any home inhalers or O2. Wheezing overnight on 10/13 and added Xopenex inhaler. --Anoro ellipta inhaler --azithromycin 500mg  PO Day 3/3 --prednisone 40mg  Day 2/3 --tessalon 200mg  PRN --guaiFENesin PRN --continuous pulse ox, maintain saturation 88-92%, wean off O2 as tolerated --AM CBC Frailty Per family and chart review, pt has a history of falls  and previously seen by palliative care in an outpatient setting, last appointment around March 2024. Lives in a tiny home in daughter's backyard. Per daughter, pt desires full code at this time. Pt unable to participate fully in code status conversation, appears to have element of cognitive decline of aging vs dementia though no documented diagnosis.  Daughter, [name], is HCPOA but all children are very actively involved in their father's care. Kim manages his medications, which were confirmed today. --palliative consult to help discern GOC with pt and family: son will check with his sisters about their availability to meet and discuss code status as well as options for getting him home with hospice. Leaning towards DNR/DNI.  --PT/OT eval and treat, fall precautions EKG abnormality Cardiology consult: tachycardia due to suspected MAT, Sinus with PVCs with couplets. No afib noted however pt has history of afib with RVR. TSH wnl.  --continuous telemetry --continue metoprolol, unable to titrate, soft BP's Malnutrition of moderate degree -RD consult: Ensure Enlive PO BID, MVI with minerals daily, assistance with feeding, liberalize diet  Type 2 diabetes mellitus with hyperglycemia (HCC) CBG elevated at 190-244. Not meeting goal 140-180. Outpatient on metformin 500 mg TID.  - transition to metformin 500 mg BID  Chronic and Stable Issues: CAD s/p CABG x4: plavix 75mg  daily HLD: zetia BPH: UOP overnight, consider adding flomax 0.4mg  if having difficulty   FEN/GI: carb modified PPx: lovenox Dispo: pending clinical improvement  Subjective:  No acute events overnight. Pt reports continued SOB but improved from yesterday. States frequent cough is bothering him. Pt reports last bowel movement was day before admission.   Objective: Temp:  [98.1 F (36.7 C)-99.4 F (37.4 C)]  98.1 F (36.7 C) (10/15 0747) Pulse Rate:  [93-113] 99 (10/15 0747) Resp:  [17-28] 20 (10/15 0747) BP: (98-111)/(66-78)  98/66 (10/15 0747) SpO2:  [91 %-100 %] 95 % (10/15 0747) Weight:  [70.5 kg] 70.5 kg (10/15 0146) Physical Exam: General: Alert, oriented, NAD Cardiovascular: Tachycardiac and irregular, no murmur, no JVD, Radial pulse palpable.  Respiratory: Wheezing diffusely b/l, no rhonchi or rales, wet cough Abdomen: No TTP, bowel sounds present, soft Extremities: 1+ pitting edema in lower extremities b/l, ecchymosis on left upper extremity   Laboratory: Most recent CBC Lab Results  Component Value Date   WBC 10.2 04/27/2023   HGB 9.5 (L) 04/27/2023   HCT 28.1 (L) 04/27/2023   MCV 95.6 04/27/2023   PLT 212 04/27/2023   Most recent BMP    Latest Ref Rng & Units 04/27/2023    3:57 AM  BMP  Glucose 70 - 99 mg/dL 951   BUN 8 - 23 mg/dL 25   Creatinine 8.84 - 1.24 mg/dL 1.66   Sodium 063 - 016 mmol/L 131   Potassium 3.5 - 5.1 mmol/L 3.6   Chloride 98 - 111 mmol/L 95   CO2 22 - 32 mmol/L 24   Calcium 8.9 - 10.3 mg/dL 9.2    Ronney Lion, Medical Student 04/27/2023, 9:50 AM  [facility] FPTS Intern pager: [phone], text pages welcome Secure chat group [facility] Teaching Service

## 2023-04-27 NOTE — Progress Notes (Signed)
Mobility Specialist Progress Note:   04/27/23 1205  Mobility  Activity Transferred from bed to chair  Level of Assistance Maximum assist, patient does 25-49% (+2)  Assistive Device Stedy  Activity Response Tolerated well  Mobility Referral Yes  $Mobility charge 1 Mobility  Mobility Specialist Start Time (ACUTE ONLY) 1130  Mobility Specialist Stop Time (ACUTE ONLY) 1145  Mobility Specialist Time Calculation (min) (ACUTE ONLY) 15 min   Pt received in bed, agreeable to transfer B>C. Attempted STS x2 without AD. Required Stedy and Max +2 to transfer. Tolerated well, experienced multiple coughing spells and c/o weakness. Left sitting up in chair, SpO2 98% on 2L. All needs met, call bell in reach.   Feliciana Rossetti Mobility Specialist Please contact via Special educational needs teacher or  Rehab office at [phone]

## 2023-04-27 NOTE — Assessment & Plan Note (Addendum)
-  RD consult: Ensure Enlive PO BID, MVI with minerals daily, assistance with feeding, liberalize diet

## 2023-04-27 NOTE — Assessment & Plan Note (Addendum)
Cardiology consult: tachycardia due to suspected MAT, Sinus with PVCs with couplets. No afib noted however pt has history of afib with RVR. TSH wnl.  --continuous telemetry --continue metoprolol, unable to titrate, soft BP's

## 2023-04-27 NOTE — Plan of Care (Signed)
Problem: Clinical Measurements: Goal: Ability to maintain clinical measurements within normal limits will improve Outcome: Progressing Goal: Will remain free from infection Outcome: Progressing   Problem: Nutrition: Goal: Adequate nutrition will be maintained Outcome: Progressing   Problem: Safety: Goal: Ability to remain free from injury will improve Outcome: Progressing   Problem: Skin Integrity: Goal: Risk for impaired skin integrity will decrease Outcome: Progressing

## 2023-04-27 NOTE — Plan of Care (Signed)
Pt alert and oriented to self and situation. Non productive cough. Vitals stable. Edema to BLE.  Problem: Education: Goal: Knowledge of General Education information will improve Description: Including pain rating scale, medication(s)/side effects and non-pharmacologic comfort measures Outcome: Progressing   Problem: Health Behavior/Discharge Planning: Goal: Ability to manage health-related needs will improve Outcome: Progressing   Problem: Clinical Measurements: Goal: Ability to maintain clinical measurements within normal limits will improve Outcome: Progressing Goal: Will remain free from infection Outcome: Progressing Goal: Diagnostic test results will improve Outcome: Progressing Goal: Respiratory complications will improve Outcome: Progressing Goal: Cardiovascular complication will be avoided Outcome: Progressing   Problem: Activity: Goal: Risk for activity intolerance will decrease Outcome: Progressing   Problem: Nutrition: Goal: Adequate nutrition will be maintained Outcome: Progressing   Problem: Coping: Goal: Level of anxiety will decrease Outcome: Progressing   Problem: Elimination: Goal: Will not experience complications related to bowel motility Outcome: Progressing Goal: Will not experience complications related to urinary retention Outcome: Progressing   Problem: Pain Managment: Goal: General experience of comfort will improve Outcome: Progressing   Problem: Safety: Goal: Ability to remain free from injury will improve Outcome: Progressing   Problem: Skin Integrity: Goal: Risk for impaired skin integrity will decrease Outcome: Progressing   Problem: Education: Goal: Ability to demonstrate management of disease process will improve Outcome: Progressing Goal: Ability to verbalize understanding of medication therapies will improve Outcome: Progressing Goal: Individualized Educational Video(s) Outcome: Progressing   Problem: Activity: Goal:  Capacity to carry out activities will improve Outcome: Progressing   Problem: Cardiac: Goal: Ability to achieve and maintain adequate cardiopulmonary perfusion will improve Outcome: Progressing

## 2023-04-28 DIAGNOSIS — J441 Chronic obstructive pulmonary disease with (acute) exacerbation: Secondary | ICD-10-CM | POA: Diagnosis not present

## 2023-04-28 DIAGNOSIS — R000 Tachycardia, unspecified: Secondary | ICD-10-CM | POA: Diagnosis not present

## 2023-04-28 DIAGNOSIS — E8770 Fluid overload, unspecified: Secondary | ICD-10-CM | POA: Diagnosis not present

## 2023-04-28 DIAGNOSIS — R54 Age-related physical debility: Secondary | ICD-10-CM | POA: Diagnosis not present

## 2023-04-28 DIAGNOSIS — E1165 Type 2 diabetes mellitus with hyperglycemia: Secondary | ICD-10-CM | POA: Diagnosis not present

## 2023-04-28 DIAGNOSIS — I5023 Acute on chronic systolic (congestive) heart failure: Secondary | ICD-10-CM | POA: Diagnosis not present

## 2023-04-28 DIAGNOSIS — R296 Repeated falls: Secondary | ICD-10-CM

## 2023-04-28 LAB — CBC
HCT: 29.7 % — ABNORMAL LOW (ref 39.0–52.0)
Hemoglobin: 10 g/dL — ABNORMAL LOW (ref 13.0–17.0)
MCH: 31.5 pg (ref 26.0–34.0)
MCHC: 33.7 g/dL (ref 30.0–36.0)
MCV: 93.7 fL (ref 80.0–100.0)
Platelets: 218 10*3/uL (ref 150–400)
RBC: 3.17 MIL/uL — ABNORMAL LOW (ref 4.22–5.81)
RDW: 14 % (ref 11.5–15.5)
WBC: 10 10*3/uL (ref 4.0–10.5)
nRBC: 0 % (ref 0.0–0.2)

## 2023-04-28 LAB — GLUCOSE, CAPILLARY
Glucose-Capillary: 215 mg/dL — ABNORMAL HIGH (ref 70–99)
Glucose-Capillary: 222 mg/dL — ABNORMAL HIGH (ref 70–99)
Glucose-Capillary: 250 mg/dL — ABNORMAL HIGH (ref 70–99)
Glucose-Capillary: 290 mg/dL — ABNORMAL HIGH (ref 70–99)
Glucose-Capillary: 299 mg/dL — ABNORMAL HIGH (ref 70–99)
Glucose-Capillary: 329 mg/dL — ABNORMAL HIGH (ref 70–99)
Glucose-Capillary: 427 mg/dL — ABNORMAL HIGH (ref 70–99)
Glucose-Capillary: 456 mg/dL — ABNORMAL HIGH (ref 70–99)
Glucose-Capillary: 459 mg/dL — ABNORMAL HIGH (ref 70–99)
Glucose-Capillary: 476 mg/dL — ABNORMAL HIGH (ref 70–99)

## 2023-04-28 LAB — BASIC METABOLIC PANEL
Anion gap: 12 (ref 5–15)
Anion gap: 13 (ref 5–15)
BUN: 37 mg/dL — ABNORMAL HIGH (ref 8–23)
BUN: 38 mg/dL — ABNORMAL HIGH (ref 8–23)
CO2: 24 mmol/L (ref 22–32)
CO2: 24 mmol/L (ref 22–32)
Calcium: 9.2 mg/dL (ref 8.9–10.3)
Calcium: 9.6 mg/dL (ref 8.9–10.3)
Chloride: 92 mmol/L — ABNORMAL LOW (ref 98–111)
Chloride: 96 mmol/L — ABNORMAL LOW (ref 98–111)
Creatinine, Ser: 1.35 mg/dL — ABNORMAL HIGH (ref 0.61–1.24)
Creatinine, Ser: 1.26 mg/dL — ABNORMAL HIGH (ref 0.61–1.24)
GFR, Estimated: 52 mL/min — ABNORMAL LOW (ref >60)
GFR, Estimated: 56 mL/min — ABNORMAL LOW (ref >60)
Glucose, Bld: 490 mg/dL — ABNORMAL HIGH (ref 70–99)
Glucose, Bld: 189 mg/dL — ABNORMAL HIGH (ref 70–99)
Potassium: 4.1 mmol/L (ref 3.5–5.1)
Potassium: 3.8 mmol/L (ref 3.5–5.1)
Sodium: 128 mmol/L — ABNORMAL LOW (ref 135–145)
Sodium: 133 mmol/L — ABNORMAL LOW (ref 135–145)

## 2023-04-28 LAB — MAGNESIUM: Magnesium: 1.9 mg/dL (ref 1.7–2.4)

## 2023-04-28 MED ORDER — MAGNESIUM SULFATE IN D5W 1-5 GM/100ML-% IV SOLN: 1.0000 g | Freq: Once | INTRAVENOUS | Status: DC

## 2023-04-28 MED ORDER — MAGNESIUM SULFATE 2 GM/50ML IV SOLN
2.0000 g | Freq: Once | INTRAVENOUS | Status: AC
  Administered 2023-04-28: 2 g via INTRAVENOUS
  Filled 2023-04-28: qty 50

## 2023-04-28 MED ORDER — INSULIN ASPART 100 UNIT/ML IJ SOLN
0.0000 [IU] | Freq: Every day | INTRAMUSCULAR | Status: DC
  Administered 2023-04-28: 4 [IU] via SUBCUTANEOUS

## 2023-04-28 MED ORDER — FUROSEMIDE 40 MG PO TABS
60.0000 mg | ORAL_TABLET | Freq: Two times a day (BID) | ORAL | Status: DC
  Administered 2023-04-28 – 2023-04-29 (×2): 60 mg via ORAL
  Filled 2023-04-28 (×2): qty 1

## 2023-04-28 MED ORDER — INSULIN ASPART 100 UNIT/ML IJ SOLN
10.0000 [IU] | Freq: Once | INTRAMUSCULAR | Status: AC
  Administered 2023-04-28: 10 [IU] via SUBCUTANEOUS

## 2023-04-28 MED ORDER — INSULIN ASPART 100 UNIT/ML IJ SOLN
3.0000 [IU] | Freq: Once | INTRAMUSCULAR | Status: AC
  Administered 2023-04-28: 3 [IU] via SUBCUTANEOUS

## 2023-04-28 MED ORDER — INSULIN ASPART 100 UNIT/ML IJ SOLN
0.0000 [IU] | Freq: Three times a day (TID) | INTRAMUSCULAR | Status: DC
  Administered 2023-04-29: 3 [IU] via SUBCUTANEOUS

## 2023-04-28 NOTE — Assessment & Plan Note (Addendum)
-  RD consult: Ensure Enlive PO BID, MVI with minerals daily, assistance with feeding, liberalize diet

## 2023-04-28 NOTE — Progress Notes (Addendum)
Daily Progress Note Intern Pager: [phone]  Patient name: [name] Medical record number: [id number] Date of birth: [birth year] Age: 82 y.o. Gender: male  Primary Care Provider: Blane Ohara, MD Consultants: heart failure, palliative Code Status: full   Pt Overview and Major Events to Date:  10/13: admitted to FMTS   Assessment and Plan: [name] is a 82 y.o. male presenting with 3 weeks of cough and shortness of breath 2/2 CHF and COPD exacerbation.   Assessment & Plan Acute exacerbation of CHF (congestive heart failure) (HCC) NYHA Class III-IV. Dyspnea and orthopnea with peripheral edema and bibasilar crackles on exam and BNP 1878. Most recent echo in Oct 2023 with EF 25-30%. Failed outpatient diuresis with PO lasix, though was only taking 20mg  BID vs prescribed 40mg  BID from PCP note on 10/1. TSH WNL, A1c 6.5. Repeat Echo (10/14): Left ventricular ejection fraction, by estimation, is 20 to 25%. Moderate to severe mitral valve regurgitation. Moderate tricuspid regurgitation. Cr elevated to 1.35 today from 1.05 two days ago suggesting AKI.  --Transition to oral lasix 60 mg BID: ~1L urine OP yesterday. Weight 70.5 kg today. Elevated from 68 kg from discharge on 7/11.   --heart failure consult: no new changes. resume vericiguat upon discharge.  --continue home digoxin, metoprolol --AM BMP --Mg 1.9, replete with 2 g  --strict I/Os, daily standing weights  COPD exacerbation (HCC) Meets 3/3 cardinal symptoms- increased purulence, sputum volume, dyspnea. CXR with increased pulmonary vascular congestion and rhonchorous lung sounds. COVID/flu/RSV and RVP negative. Most recent PFTs with FEV1/FVC of 67.5% in 2021. Not on any home inhalers or O2. Wheezing overnight on 10/13 and added Xopenex inhaler. --Anoro ellipta inhaler --azithromycin 500mg  PO x 3 days completed  --prednisone 40mg  Day discontinued due to high glucose --tessalon 200mg  PRN --guaiFENesin PRN --continuous  pulse ox, maintain saturation 88-92%, O2 1 L [place] for comfort measures  --AM CBC Frailty Per family and chart review, pt has a history of falls and previously seen by palliative care in an outpatient setting, last appointment around March 2024. Lives in a tiny home in daughter's backyard. Daughter, [name], is HCPOA but all children are very actively involved in their father's care. Kim manages his medications, which were confirmed today.  --palliative consult: SNF then home with hospice plan. DNR/DNI. --PT/OT eval and treat --fall precautions EKG abnormality Cardiology consult: tachycardia due to suspected MAT, Sinus with PVCs with couplets. No afib noted however pt has history of afib with RVR. TSH wnl.  --continuous telemetry --continue metoprolol, unable to titrate, soft BP's Malnutrition of moderate degree -RD consult: Ensure Enlive PO BID, MVI with minerals daily, assistance with feeding, liberalize diet  Type 2 diabetes mellitus with hyperglycemia (HCC) Not meeting goal 140-180.  -Outpatient on metformin 500 mg TID -> transitioned to metformin 500 mg BID -NovoLog 10 units x 2  given due to glucose 490 this morning, no anion gap -monitor BMP, CBGs Frequent falls Many recurrent falls over the past year. -PT/OT -Fall precautions  Chronic and Stable Issues: CAD s/p CABG x4: plavix 75mg  daily HLD: zetia BPH: consider adding flomax 0.4mg  if having difficulty Afib: hx in 2023   FEN/GI: carb modified PPx: lovenox Dispo: pending clinical improvement  Subjective:  Pt denies SOB. Pt reports small bowel movement yesterday.   Objective: Temp:  [97.5 F (36.4 C)-99.1 F (37.3 C)] 97.5 F (36.4 C) (10/16 0753) Pulse Rate:  [57-115] 99 (10/16 0753) Resp:  [0-45] 19 (10/16 0753) BP: (99-110)/(68-78) 99/70 (10/16  0753) SpO2:  [89 %-100 %] 99 % (10/16 0753) Weight:  [70.5 kg] 70.5 kg (10/16 0421)  Physical Exam: General: Alert, oriented, NAD Cardiovascular: Tachycardiac and irregular,  no murmur, no JVD, Radial pulse palpable.  Respiratory: no wheezing, rhonchi or rales. +wet cough. No respiratory distress. CTA on O2 2 L [place], weaned to 1 L.  Abdomen: No TTP, bowel sounds present, soft Extremities: trace pitting edema in lower extremities b/l, ecchymosis on left upper extremity   Laboratory: Most recent CBC Lab Results  Component Value Date   WBC 10.0 04/28/2023   HGB 10.0 (L) 04/28/2023   HCT 29.7 (L) 04/28/2023   MCV 93.7 04/28/2023   PLT 218 04/28/2023   Most recent BMP    Latest Ref Rng & Units 04/28/2023    3:27 AM  BMP  Glucose 70 - 99 mg/dL 657   BUN 8 - 23 mg/dL 37   Creatinine 8.46 - 1.24 mg/dL 9.62   Sodium 952 - 841 mmol/L 128   Potassium 3.5 - 5.1 mmol/L 4.1   Chloride 98 - 111 mmol/L 92   CO2 22 - 32 mmol/L 24   Calcium 8.9 - 10.3 mg/dL 9.2    Ronney Lion, Medical Student 04/28/2023, 8:31 AM  [facility] FPTS Intern pager: [phone], text pages welcome Secure chat group [facility] Teaching Service    I was personally present and performed or re-performed the history, physical exam and medical decision making activities of this service and have verified that the service and findings are accurately documented in the student's note.  Janeal Holmes, MD                  04/28/2023, 12:42 PM

## 2023-04-28 NOTE — Assessment & Plan Note (Addendum)
Per family and chart review, pt has a history of falls and previously seen by palliative care in an outpatient setting, last appointment around March 2024. Lives in a tiny home in daughter's backyard. Daughter, [name], is HCPOA but all children are very actively involved in their father's care. Kim manages his medications, which were confirmed today.  --palliative consult: SNF then home with hospice plan. DNR/DNI. --PT/OT eval and treat --fall precautions

## 2023-04-28 NOTE — Assessment & Plan Note (Addendum)
Not meeting goal 140-180.  -Outpatient on metformin 500 mg TID -> transitioned to metformin 500 mg BID -NovoLog 10 units x 2  given due to glucose 490 this morning, no anion gap -monitor BMP, CBGs

## 2023-04-28 NOTE — Progress Notes (Addendum)
Patient Name: [name] Date of Encounter: 04/28/2023 [facility] Cardiologist: Kristeen Miss, MD   Interval Summary  .    82 y.o. male with a hx of CAD s/p CABG 2007 maintained on Plavix, chronic HFrEF, moderate to severe MR and mild AI, HLD (intolerant of statins), multifocal atrial tach 01/2022, NSVT/PSVT/PACs/PVCs by monitor 09/2022, DM, COPD, BPH, hiatal hernia, thrombocytopenia, cystic lesion in upper abdomen by CT 01/2023, cardiology is following for CHF.   Patient states he feels a little better today, less SOB at rest and less cough, no chest pain. He was out of bed to chair yesterday. He does not note any swelling of his legs.   Vital Signs .    Vitals:   04/27/23 2322 04/28/23 0318 04/28/23 0421 04/28/23 0753  BP: 104/74 107/78  99/70  Pulse: 98 98  99  Resp: (!) 22 16  19   Temp: 99.1 F (37.3 C) 98.7 F (37.1 C)  (!) 97.5 F (36.4 C)  TempSrc: Axillary Axillary  Axillary  SpO2: 100% 98%  99%  Weight:   70.5 kg   Height:        Intake/Output Summary (Last 24 hours) at 04/28/2023 0940 Last data filed at 04/28/2023 0807 Gross per 24 hour  Intake 1071 ml  Output 1090 ml  Net -19 ml      04/28/2023    4:21 AM 04/27/2023    1:46 AM 04/26/2023    3:56 AM  Last 3 Weights  Weight (lbs) 155 lb 6.8 oz 155 lb 6.8 oz 155 lb 6.8 oz  Weight (kg) 70.5 kg 70.5 kg 70.5 kg      Telemetry/ECG    Sinus tachycardia 110s with frequent PVCs- Personally Reviewed  Physical Exam .   GEN: No acute distress.   Neck: No JVD Cardiac: Occasional skipped heart beat noted, grade II systolic murmur at apex Respiratory: Clear to auscultation bilaterally. On [place] oxygen, no wheezing  GI: Soft, nontender, non-distended  MS: No edema  Assessment & Plan .     Acute on chronic systolic heart failure - presented with SOB and cough for weeks  - BNP 1878, POA - CXR showed small pleural effusions on 04/13/2023 are less apparent. No new cardiopulmonary abnormality. - s/p  IV lasix diuresis, I&O not accurate, weight is down from 156.53 >155.42 so far, feels improved with breathing, presentation is multifactorial from CHF and COPD exacerbation and decondition, renal index rising now although also hyperglycemic with FG 490 this AM, not sure if HHS related, given overall improved exam, will transition to PO Lasix today, on PTA 40mg  BID, will change to 60mg  BID for now - GDMT: historically limited by low BP, continue PT metoprolol XL 12.5mg  daily and digoxin 0.125 mcg daily, no additional therapy given low normal BP; can resume PTA vericiguat 5mg  daily upon discharge   MAT/PVC/sinus tachycardia  - sinus tachycardia currently on monitor with frequent PVCs  - lung disease likely contributing, continue lose dose BB   Moderate-severe mitral regurgitation, mild aortic insufficiency  - not candidate for mTEER per previous structural heart team evaluation  - diuresis for CHF as needed   CAD s/p CABG 2007, last cath 01/2022 managed medically - continue Plavix 75mg  daily, zetia 10mg , toprolol XL 12.5mg  daily  - no angina symptoms   Goals of care - agree with palliative care consult, explained to the patient that CPR/mechanical ventilation/ACLS protocol are aggressive lifesaving measures, but unlikely make significant improvement to his quality of life considering  his advanced age and complex medical conditions  Type 2 DM Debility  COPD HLD BPH - per primary team   For questions or updates, please contact [facility] Please consult [email or web address] for contact info under     Signed, Cyndi Bender, NP  History and all data above reviewed.  Patient examined.  I agree with the findings as above.  The patient reports improved breathing and less cough.  No chest pain.  The patient exam reveals COR:RRR  ,  Lungs: Decreased breath sounds at the bases  ,  Abd: Positive bowel sounds, no rebound no guarding, Ext No edema  .  All available labs, radiology testing, previous  records reviewed. Agree with documented assessment and plan. Acute on chronic systolic HF:  Med titration not possibly historically or during this admission.  Diuresis has been helpful.  Will change to PO diuretic as above.  CAD:  Continue beta blocker and Plavix.   [name]  11:10 AM  04/28/2023

## 2023-04-28 NOTE — Progress Notes (Signed)
Daily Progress Note   Patient Name: [name]       Date: 04/28/2023 DOB: 1941/01/20  Age: 82 y.o. MRN#: [id number] Attending Physician: Doreene Eland, MD Primary Care Physician: Blane Ohara, MD Admit Date: 04/25/2023  Reason for Consultation/Follow-up: Establishing goals of care  Subjective: Medical records reviewed including progress notes, labs, and imaging. Patient assessed at the bedside. He appears comfortable and in no acute distress. No family present during my visit. Discussed with primary team via secure chat.  Called patient's son [name] for ongoing goals of care discussions and palliative support. Reviewed his conversation with primary team today and created space for further thoughts and feelings after our family meeting yesterday.   [name] shares that all 3 of patient's children continue to desire DNR/DNI and to prevent additional suffering. They were not able to discuss much with patient after our meeting given his impaired cognition. Provided education that patient does not need to sign any paperwork and that he currently is too confused to make this decision on his own. Explained that family can make this decision based on their understanding of patient's goals, care preferences and beliefs. He appreciates the clarification and agrees to this PA updating code status accordingly. Goal remains SNF placement at this time.  Questions and concerns addressed. PMT will continue to support holistically.   Length of Stay: 3   Physical Exam Vitals and nursing note reviewed.  Constitutional:      General: He is not in acute distress.    Appearance: He is ill-appearing.  Cardiovascular:     Rate and Rhythm: Normal rate.  Pulmonary:     Effort: Pulmonary effort is normal.  No respiratory distress.  Neurological:     Mental Status: He is alert.     Motor: Weakness present.  Psychiatric:        Mood and Affect: Mood normal.        Behavior: Behavior normal.        Cognition and Memory: Cognition is impaired.         Vital Signs: BP 99/70 (BP Location: Left Arm)   Pulse 99   Temp (!) 97.5 F (36.4 C) (Axillary)   Resp 19   Ht 5\' 10"  (1.778 m)   Wt 70.5 kg   SpO2 99%   BMI 22.30  kg/m  SpO2: SpO2: 99 % O2 Device: O2 Device: Nasal Cannula O2 Flow Rate: O2 Flow Rate (L/min): 2 L/min      Palliative Assessment/Data: 40% at best   Palliative Care Assessment & Plan   Patient Profile: 82 y.o. male  with past medical history of CHF, COPD, CAD, HTN, HLD, ITP and DM  admitted on 04/25/2023 with cough and SOB for 3-4 weeks.    Patient is admitted with acute hypoxic respiratory failure, acute CHF exacerbation and COPD exacerbation.  He has been previously seen by palliative care in an outpatient setting, last appointment around March 2024. PMT has been consulted to assist with goals of care conversation.  Assessment: Goals of care conversation Acute on chronic CHF COPD exacerbation Acute hypoxic respiratory failure Frailty and debility  Recommendations/Plan: Code status changed to DNR-Limited (no intubation). Will sign gold form and place in patient's chart. Will also scan copy into EMR/Vynca Continue current care plan Goal remains SNF placement with outpatient palliative care follow up. TOC previously consulted  Psychosocial and emotional support provided PMT will continue to follow and support as needed   Prognosis:  Weeks to months  Discharge Planning: Skilled Nursing Facility for rehab with Palliative care service follow-up  Care plan was discussed with patient, patient's son, FMTS    MDM high         [name] Jeni Salles, PA-C  Palliative Medicine Team Team phone # [phone]  Thank you for allowing the Palliative Medicine Team  to assist in the care of this patient. Please utilize secure chat with additional questions, if there is no response within 30 minutes please call the above phone number.  Palliative Medicine Team providers are available by phone from 7am to 7pm daily and can be reached through the team cell phone.  Should this patient require assistance outside of these hours, please call the patient's attending physician.

## 2023-04-28 NOTE — Plan of Care (Signed)

## 2023-04-28 NOTE — Progress Notes (Signed)
Physical Therapy Treatment Patient Details Name: [name] MRN: [id number] DOB: 07-15-1940 Today's Date: 04/28/2023   History of Present Illness Pt is an 82 y.o. male presenting 10/13 from home with SOB 1 month. CXR negative for PNA. Admitted with acute on chronic CHF exacerbation. PMH includes: CAD s/p CABG, COPD, HLD, and DM II. CT cervical spine 2018: Severe degenerative disc disease at C6-7.    PT Comments  Pt in bed upon arrival with family present. Pt agreeable to PT session. Pt was limited in today's session due to fatigue and coughing with movement. Pt was able to complete minimal exercises in supine with tactile cues and physical assistance. Acute PT to follow.     If plan is discharge home, recommend the following: A lot of help with walking and/or transfers;A lot of help with bathing/dressing/bathroom;Assistance with cooking/housework;Assistance with feeding;Direct supervision/assist for medications management;Direct supervision/assist for financial management;Assist for transportation;Help with stairs or ramp for entrance;Supervision due to cognitive status   Can travel by private vehicle     No        Precautions / Restrictions Precautions Precautions: Fall Restrictions Weight Bearing Restrictions: No     Mobility  Bed Mobility Overal bed mobility: Needs Assistance    General bed mobility comments: MaxA to adjust shoulders while laying supine    Transfers  General transfer comment: deferred transfer, pt with increased SOB and coughing with movements, fatigued        Balance  Unable to fully assess       Cognition Arousal: Alert Behavior During Therapy: Flat affect Overall Cognitive Status: Impaired/Different from baseline      Following Commands: Follows one step commands with increased time Safety/Judgement: Decreased awareness of safety, Decreased awareness of deficits   Problem Solving: Slow processing, Decreased initiation, Difficulty  sequencing General Comments: slow processing for cues for mobility, increased time after questions        Exercises General Exercises - Lower Extremity Ankle Circles/Pumps: AROM, Supine, Both, 10 reps (limited ROM) Quad Sets: AROM, Supine, Both, 5 reps Heel Slides: AAROM, Both, 5 reps, Supine    General Comments General comments (skin integrity, edema, etc.): not able to assess balance today, VSS on RA. Pt coughing with activity with increased RR. Cues for deep breathing with little follow through      Pertinent Vitals/Pain Pain Assessment Pain Assessment: No/denies pain     PT Goals (current goals can now be found in the care plan section) Progress towards PT goals: Progressing toward goals    Frequency    Min 1X/week       AM-PAC PT "6 Clicks" Mobility   Outcome Measure  Help needed turning from your back to your side while in a flat bed without using bedrails?: A Lot Help needed moving from lying on your back to sitting on the side of a flat bed without using bedrails?: A Lot Help needed moving to and from a bed to a chair (including a wheelchair)?: A Lot Help needed standing up from a chair using your arms (e.g., wheelchair or bedside chair)?: A Lot Help needed to walk in hospital room?: Total Help needed climbing 3-5 steps with a railing? : Total 6 Click Score: 10    End of Session   Activity Tolerance: Patient limited by fatigue (coughing) Patient left: in bed;with call bell/phone within reach;with family/visitor present Nurse Communication: Mobility status PT Visit Diagnosis: Other abnormalities of gait and mobility (R26.89);Muscle weakness (generalized) (M62.81);History of falling (Z91.81)  Time: 7846-9629 PT Time Calculation (min) (ACUTE ONLY): 15 min  Charges:    $Therapeutic Exercise: 8-22 mins PT General Charges $$ ACUTE PT VISIT: 1 Visit                    Hilton Cork, PT, DPT Secure Chat Preferred  Rehab Office [phone]    Arturo Morton  Brion Aliment 04/28/2023, 4:12 PM

## 2023-04-28 NOTE — Progress Notes (Signed)
Mobility Specialist Progress Note:    04/28/23 1013  Mobility  Activity Transferred from bed to chair  Level of Assistance Moderate assist, patient does 50-74%  Assistive Device [employer]/Exercises Active;All extremities  Activity Response Tolerated well  Mobility Referral Yes  $Mobility charge 1 Mobility  Mobility Specialist Start Time (ACUTE ONLY) 0945  Mobility Specialist Stop Time (ACUTE ONLY) 1009  Mobility Specialist Time Calculation (min) (ACUTE ONLY) 24 min   Pt received in bed, agreeable to mobility session. Required Mod A+2 with Stedy to transfer B>C. Tolerated well, experienced successful coughing spells throughout. Pt desat when transferring, SPO2 82% on 2L but recovered sitting up in chair, SpO2 98% on 2L (post mobility). Left pt in chair, alarm on, call bell in reach, all needs met.   Feliciana Rossetti Mobility Specialist Please contact via Special educational needs teacher or  Rehab office at [phone]

## 2023-04-28 NOTE — Assessment & Plan Note (Addendum)
Meets 3/3 cardinal symptoms- increased purulence, sputum volume, dyspnea. CXR with increased pulmonary vascular congestion and rhonchorous lung sounds. COVID/flu/RSV and RVP negative. Most recent PFTs with FEV1/FVC of 67.5% in 2021. Not on any home inhalers or O2. Wheezing overnight on 10/13 and added Xopenex inhaler. --Anoro ellipta inhaler --azithromycin 500mg  PO x 3 days completed  --prednisone 40mg  Day discontinued due to high glucose --tessalon 200mg  PRN --guaiFENesin PRN --continuous pulse ox, maintain saturation 88-92%, O2 1 L [place] for comfort measures  --AM CBC

## 2023-04-28 NOTE — Assessment & Plan Note (Addendum)
NYHA Class III-IV. Dyspnea and orthopnea with peripheral edema and bibasilar crackles on exam and BNP 1878. Most recent echo in Oct 2023 with EF 25-30%. Failed outpatient diuresis with PO lasix, though was only taking 20mg  BID vs prescribed 40mg  BID from PCP note on 10/1. TSH WNL, A1c 6.5. Repeat Echo (10/14): Left ventricular ejection fraction, by estimation, is 20 to 25%. Moderate to severe mitral valve regurgitation. Moderate tricuspid regurgitation. Cr elevated to 1.35 today from 1.05 two days ago suggesting AKI.  --Transition to oral lasix 60 mg BID: ~1L urine OP yesterday. Weight 70.5 kg today. Elevated from 68 kg from discharge on 7/11.   --heart failure consult: no new changes. resume vericiguat upon discharge.  --continue home digoxin, metoprolol --AM BMP --Mg 1.9, replete with 2 g  --strict I/Os, daily standing weights

## 2023-04-28 NOTE — TOC Progression Note (Signed)
Transition of Care [facility]) - Progression Note    Patient Details  Name: STONE SPIRITO MRN: [id number] Date of Birth: 05-Oct-1940  [facility] CM/SW Contact  Dellie Burns [place], [place] Phone Number: 04/28/2023, 2:02 PM  Clinical Narrative:   provided current SNF offers to pt's dtr [name]. Unfortunately her preferred facility [facility] does not have bed availability in the near future. Dtr has accepted [facility]. Confirmed bed with Nelma Rothman at [facility]. CMA to start [place]. SW will provide updates as available.   Dellie Burns, MSW, LCSW [phone] (coverage)      Expected Discharge Plan: Skilled Nursing Facility Barriers to Discharge: Continued Medical Work up, English as a second language teacher, SNF Pending bed offer  Expected Discharge Plan and Services       Living arrangements for the past 2 months: Single Family Home                                       Social Determinants of Health (SDOH) Interventions SDOH Screenings   Food Insecurity: No Food Insecurity (04/25/2023)  Housing: Low Risk  (04/25/2023)  Transportation Needs: No Transportation Needs (04/25/2023)  Utilities: Not At Risk (04/25/2023)  Alcohol Screen: Low Risk  (01/26/2022)  Depression (PHQ2-9): Low Risk  (02/17/2023)  Financial Resource Strain: Low Risk  (01/26/2022)  Physical Activity: Sufficiently Active (08/19/2022)  Social Connections: Moderately Isolated (08/19/2022)  Stress: No Stress Concern Present (08/19/2022)  Tobacco Use: Low Risk  (04/25/2023)  Health Literacy: Adequate Health Literacy (02/17/2023)    Readmission Risk Interventions     No data to display

## 2023-04-28 NOTE — Assessment & Plan Note (Addendum)
Cardiology consult: tachycardia due to suspected MAT, Sinus with PVCs with couplets. No afib noted however pt has history of afib with RVR. TSH wnl.  --continuous telemetry --continue metoprolol, unable to titrate, soft BP's

## 2023-04-28 NOTE — Progress Notes (Signed)
Mobility Specialist Progress Note:    04/28/23 1355  Mobility  Activity Transferred from chair to bed  Level of Assistance Moderate assist, patient does 50-74% (+2)  Assistive Device [employer]/Exercises Active;All extremities  Activity Response Tolerated well  Mobility Referral Yes  $Mobility charge 1 Mobility  Mobility Specialist Start Time (ACUTE ONLY) 1355  Mobility Specialist Stop Time (ACUTE ONLY) 1405  Mobility Specialist Time Calculation (min) (ACUTE ONLY) 10 min   RN requested assistance to transfer patient C>B. Required ModA+2 with Stedy to transfer. Tolerated well, asx throughout. Left pt in room with RN at bedside. All needs met, call bell in reach.   Feliciana Rossetti Mobility Specialist Please contact via Special educational needs teacher or  Rehab office at [phone]

## 2023-04-28 NOTE — Plan of Care (Signed)
Problem: Clinical Measurements: Goal: Will remain free from infection Outcome: Progressing   Problem: Nutrition: Goal: Adequate nutrition will be maintained Outcome: Progressing   Problem: Pain Managment: Goal: General experience of comfort will improve Outcome: Progressing   Problem: Safety: Goal: Ability to remain free from injury will improve Outcome: Progressing   Problem: Skin Integrity: Goal: Risk for impaired skin integrity will decrease Outcome: Progressing

## 2023-04-28 NOTE — Significant Event (Addendum)
Went to the bedside to check on the patient.  Son [place] at bedside.  We discussed his clinical status.  [name] says the patient has been speaking with family members who have passed away, not eating much at all, and only drinking a little bit of fluids.  He said yesterday during the palliative conversation, Mr. Mainwaring did not seem to really understand what was going on and does not really feel that he has capacity at this point.  Family is well aware that Mr. Humber is near end-of-life.  They would like to discuss DNR/DNI status today with palliative, and that this is what he thinks the patient would want.  Tommy wishes that a member of the cardiology team would express that resuscitation would be aggressive, and not the best option for the patient.  A family member needs to be present during this conversation.  On exam, Mr. Micalizzi is awake, answers questions appropriately although says he has 3 grandchildren when really he has 8.  He is coughing productively. On 2L [place].  I offered him some ice water which he swallowed without difficulty.  He does seem to be in good spirits and appears comfortable overall.  Glucose quite elevated overnight to 490 4:18 AM, and then 456 at 6:51 AM. Did not receive any insulin, however SSI was added and RN was notified. No anion gap on BMP. Rechecked in the room and his glucose was 459 at 847.  I placed an order for insulin NovoLog 10 units.  We will recheck at 10 AM.  Darral Dash, DO PGY-3 [facility]

## 2023-04-29 ENCOUNTER — Other Ambulatory Visit: Payer: Self-pay | Admitting: Cardiovascular Disease

## 2023-04-29 ENCOUNTER — Telehealth: Payer: Medicare HMO | Admitting: Family Medicine

## 2023-04-29 DIAGNOSIS — R000 Tachycardia, unspecified: Secondary | ICD-10-CM | POA: Diagnosis not present

## 2023-04-29 DIAGNOSIS — E1165 Type 2 diabetes mellitus with hyperglycemia: Secondary | ICD-10-CM

## 2023-04-29 DIAGNOSIS — J441 Chronic obstructive pulmonary disease with (acute) exacerbation: Secondary | ICD-10-CM | POA: Diagnosis not present

## 2023-04-29 DIAGNOSIS — D649 Anemia, unspecified: Secondary | ICD-10-CM | POA: Insufficient documentation

## 2023-04-29 DIAGNOSIS — R0602 Shortness of breath: Secondary | ICD-10-CM | POA: Diagnosis not present

## 2023-04-29 DIAGNOSIS — N179 Acute kidney failure, unspecified: Secondary | ICD-10-CM

## 2023-04-29 DIAGNOSIS — R54 Age-related physical debility: Secondary | ICD-10-CM | POA: Diagnosis not present

## 2023-04-29 DIAGNOSIS — I5023 Acute on chronic systolic (congestive) heart failure: Secondary | ICD-10-CM | POA: Diagnosis not present

## 2023-04-29 LAB — GLUCOSE, CAPILLARY
Glucose-Capillary: 211 mg/dL — ABNORMAL HIGH (ref 70–99)
Glucose-Capillary: 201 mg/dL — ABNORMAL HIGH (ref 70–99)
Glucose-Capillary: 252 mg/dL — ABNORMAL HIGH (ref 70–99)
Glucose-Capillary: 271 mg/dL — ABNORMAL HIGH (ref 70–99)
Glucose-Capillary: 316 mg/dL — ABNORMAL HIGH (ref 70–99)
Glucose-Capillary: 280 mg/dL — ABNORMAL HIGH (ref 70–99)

## 2023-04-29 LAB — MAGNESIUM: Magnesium: 2.1 mg/dL (ref 1.7–2.4)

## 2023-04-29 LAB — BASIC METABOLIC PANEL
Anion gap: 12 (ref 5–15)
BUN: 35 mg/dL — ABNORMAL HIGH (ref 8–23)
CO2: 24 mmol/L (ref 22–32)
Calcium: 9.1 mg/dL (ref 8.9–10.3)
Chloride: 97 mmol/L — ABNORMAL LOW (ref 98–111)
Creatinine, Ser: 1.17 mg/dL (ref 0.61–1.24)
GFR, Estimated: >60 mL/min (ref >60)
Glucose, Bld: 222 mg/dL — ABNORMAL HIGH (ref 70–99)
Potassium: 3.6 mmol/L (ref 3.5–5.1)
Sodium: 133 mmol/L — ABNORMAL LOW (ref 135–145)

## 2023-04-29 MED ORDER — INSULIN ASPART 100 UNIT/ML IJ SOLN
0.0000 [IU] | Freq: Every day | INTRAMUSCULAR | Status: DC
  Administered 2023-04-29: 3 [IU] via SUBCUTANEOUS

## 2023-04-29 MED ORDER — METFORMIN HCL 500 MG PO TABS
1000.0000 mg | ORAL_TABLET | Freq: Two times a day (BID) | ORAL | Status: DC
  Administered 2023-04-29 – 2023-05-04 (×9): 1000 mg via ORAL
  Filled 2023-04-29 (×9): qty 2

## 2023-04-29 MED ORDER — INSULIN ASPART 100 UNIT/ML IJ SOLN
3.0000 [IU] | Freq: Once | INTRAMUSCULAR | Status: AC
  Administered 2023-04-29: 3 [IU] via SUBCUTANEOUS

## 2023-04-29 MED ORDER — FUROSEMIDE 40 MG PO TABS
40.0000 mg | ORAL_TABLET | Freq: Two times a day (BID) | ORAL | Status: DC
  Administered 2023-04-29 – 2023-05-04 (×10): 40 mg via ORAL
  Filled 2023-04-29 (×10): qty 1

## 2023-04-29 MED ORDER — INSULIN ASPART 100 UNIT/ML IJ SOLN
0.0000 [IU] | Freq: Three times a day (TID) | INTRAMUSCULAR | Status: DC
  Administered 2023-04-30: 2 [IU] via SUBCUTANEOUS

## 2023-04-29 NOTE — Assessment & Plan Note (Addendum)
Stable on 2 L [place], mostly for comfort with breathing. --Anoro ellipta inhaler --azithromycin 500mg  PO x 3 days completed  --prednisone 40mg  Day discontinued due to high glucose --tessalon 200mg  PRN --guaiFENesin PRN --continuous pulse ox, maintain saturation 88-92%, sating at 100% however on O2 1 L [place] for comfort measures

## 2023-04-29 NOTE — Plan of Care (Signed)

## 2023-04-29 NOTE — Care Management Important Message (Signed)
Important Message  Patient Details  Name: [name] MRN: [id number] Date of Birth: 01-Jul-1941   Important Message Given:  Yes - Medicare IM     Dorena Bodo 04/29/2023, 1:49 PM

## 2023-04-29 NOTE — Assessment & Plan Note (Addendum)
Continues to remain frail and weak.  --palliative consult: SNF then home with hospice plan. DNR/DNI. --PT/OT eval and treat --fall precautions

## 2023-04-29 NOTE — Progress Notes (Signed)
Family Medicine Teaching Service Attending Brief Progress Note  S: Patient seen and examined. Patient reports overall doing well. Daughter at bedside, has noticed some hand movements bilaterally that he didn't previously do.  O: BP (!) 101/59 (BP Location: Left Arm)   Pulse (!) 48   Temp 98 F (36.7 C) (Oral)   Resp 20   Ht 5\' 10"  (1.778 m)   Wt 69.5 kg   SpO2 97%   BMI 21.98 kg/m   Gen: no acute distress, pleasant, cooperative Resp: normal work of breathing with Kerr in place. Occasional weak cough. Neuro: able to move hands and feet equally. Resting tremor present bilateral hands. Speech normal. Face symmetric.   A/P:  82 yo M with CHF, COPD exacerbation in setting of increased frailty. Plan is for SNF placement with palliative care, then home with hospice.   Resume home metformin.  Appreciate palliative care and cardiology recommendations & management.  Will cosign resident note when it is available.  Levert Feinstein, MD, [facility]

## 2023-04-29 NOTE — Assessment & Plan Note (Signed)
Improved, prednisone was discontinued -resume home metformin 1000 mg BID

## 2023-04-29 NOTE — Assessment & Plan Note (Addendum)
Resolved

## 2023-04-29 NOTE — Assessment & Plan Note (Addendum)
Stable with transition to PO Lasix from IV, per cardiology will reduce down to Lasix 40 mg BID for pending discharge. Making good UOP. AKI yesterday however Cr improved to 1.17 today.  --reduce oral lasix 60 mg BID to home dose of 40 mg BID: ~1L urine OP yesterday. Weight down to 69.5 kg today. Elevated from 68 kg from discharge on 7/11.   --heart failure consult: no new changes. Do not resume vericiguat upon discharge.  --continue home digoxin, metoprolol --maintain Mg>2, K>4 --strict I/Os, daily standing weights

## 2023-04-29 NOTE — Assessment & Plan Note (Signed)
Cardiology consult: tachycardia due to suspected MAT, Sinus with PVCs with couplets. No afib noted however pt has history of afib with RVR. TSH wnl.  --continue metoprolol, unable to titrate, soft BP's

## 2023-04-29 NOTE — Assessment & Plan Note (Signed)
Could be secondary to anemia of chronic disease, worsened with HF exacerbation. Stable around 9-10. No active blood loss.

## 2023-04-29 NOTE — Progress Notes (Cosign Needed Addendum)
Daily Progress Note Intern Pager: [phone]  Patient name: [name] Medical record number: [id number] Date of birth: [birth year] Age: 82 y.o. Gender: male  Primary Care Provider: Blane Ohara, MD Consultants: heart failure, palliative Code Status: full   Pt Overview and Major Events to Date:  10/13: admitted to FMTS   Assessment and Plan: [name] is a 82 y.o. male presenting with 3 weeks of cough and shortness of breath 2/2 CHF and COPD exacerbation, now pending disposition to SNF followed by home with hospice. Medically stable for discharge, pending SNF placement. Assessment & Plan Acute exacerbation of CHF (congestive heart failure) (HCC) Stable with transition to PO Lasix from IV, per cardiology will reduce down to Lasix 40 mg BID for pending discharge. Making good UOP. AKI yesterday however Cr improved to 1.17 today.  --reduce oral lasix 60 mg BID to home dose of 40 mg BID: ~1L urine OP yesterday. Weight down to 69.5 kg today. Elevated from 68 kg from discharge on 7/11.   --heart failure consult: no new changes. Do not resume vericiguat upon discharge.  --continue home digoxin, metoprolol --maintain Mg>2, K>4 --strict I/Os, daily standing weights  COPD exacerbation (HCC) Stable on 2 L [place], mostly for comfort with breathing. --Anoro ellipta inhaler --azithromycin 500mg  PO x 3 days completed  --prednisone 40mg  Day discontinued due to high glucose --tessalon 200mg  PRN --guaiFENesin PRN --continuous pulse ox, maintain saturation 88-92%, sating at 100% however on O2 1 L [place] for comfort measures  Frailty Continues to remain frail and weak.  --palliative consult: SNF then home with hospice plan. DNR/DNI. --PT/OT eval and treat --fall precautions EKG abnormality Cardiology consult: tachycardia due to suspected MAT, Sinus with PVCs with couplets. No afib noted however pt has history of afib with RVR. TSH wnl.  --continue metoprolol, unable to titrate, soft  BP's Malnutrition of moderate degree -RD consult: Ensure Enlive PO BID, MVI with minerals daily, assistance with feeding, liberalize diet  Type 2 diabetes mellitus with hyperglycemia (HCC) Improved, prednisone was discontinued -resume home metformin 1000 mg BID Frequent falls Many recurrent falls over the past year. -PT/OT -Fall precautions Normocytic anemia Could be secondary to anemia of chronic disease, worsened with HF exacerbation. Stable around 9-10. No active blood loss.  AKI (acute kidney injury) (HCC) Resolved.   Chronic and Stable Issues: CAD s/p CABG x4: plavix 75mg  daily HLD: zetia BPH: consider adding flomax 0.4mg  if having difficulty Afib: hx in 2023   FEN/GI: carb modified PPx: lovenox Dispo: pending clinical improvement  Subjective:  Pt denies SOB. Still coughing but no sputum. Needs assistance with feeding due to difficulty with vision and grip.   Objective: Temp:  [97.6 F (36.4 C)-99.3 F (37.4 C)] 99.3 F (37.4 C) (10/17 0330) Pulse Rate:  [85-101] 88 (10/17 0330) Resp:  [16-21] 16 (10/17 0330) BP: (101-113)/(66-89) 105/66 (10/17 0330) SpO2:  [96 %-99 %] 99 % (10/17 0330) Weight:  [69.5 kg] 69.5 kg (10/17 0449)  Physical Exam: General: Alert, NAD, frail appearing Cardiovascular: Tachycardiac and irregular, no murmur, Radial pulse palpable.  Respiratory: no wheezing, rhonchi or rales. +wet cough. No respiratory distress. CTA on O2 1 L [place] Abdomen: bowel sounds present, soft Extremities: trace pitting edema in lower extremities b/l, ecchymosis on left upper extremity, weak, no calf tenderness b/l  Laboratory: Most recent CBC Lab Results  Component Value Date   WBC 10.0 04/28/2023   HGB 10.0 (L) 04/28/2023   HCT 29.7 (L) 04/28/2023   MCV 93.7 04/28/2023  PLT 218 04/28/2023   Most recent BMP    Latest Ref Rng & Units 04/29/2023    2:46 AM  BMP  Glucose 70 - 99 mg/dL 161   BUN 8 - 23 mg/dL 35   Creatinine 0.96 - 1.24 mg/dL 0.45   Sodium  409 - 811 mmol/L 133   Potassium 3.5 - 5.1 mmol/L 3.6   Chloride 98 - 111 mmol/L 97   CO2 22 - 32 mmol/L 24   Calcium 8.9 - 10.3 mg/dL 9.1    Ronney Klein, Medical Student 04/29/2023, 8:26 AM  [facility] FPTS Intern pager: [phone], text pages welcome Secure chat group [facility] Teaching Service    I was personally present and performed or re-performed the history, physical exam and medical decision making activities of this service and have verified that the service and findings are accurately documented in the student's note.  Darral Dash, DO

## 2023-04-29 NOTE — Assessment & Plan Note (Signed)
Many recurrent falls over the past year. -PT/OT -Fall precautions

## 2023-04-29 NOTE — Progress Notes (Signed)
Daily Progress Note   Patient Name: [name]       Date: 04/29/2023 DOB: 06/27/1941  Age: 82 y.o. MRN#: [id number] Attending Physician: Latrelle Dodrill, MD Primary Care Physician: Blane Ohara, MD Admit Date: 04/25/2023  Reason for Consultation/Follow-up: Establishing goals of care  Subjective: Medical records reviewed including progress notes, labs, and imaging. Patient assessed at the bedside. He is eating breakfast with assistance from NT. Denies pain or distress. No family present during my visit.  Patient does not seem to recall our last conversation, remains pleasantly confused. I then called patient's daughter [name] for ongoing palliative support and GOC discussions. She has begun discussing SNF referral process with TOC today.   I encouraged [name] to consider completion of MOST form with current care preferences in light of patient's current illness and expected trajectory. She states she has not reviewed the form yet and would like further clarification. Counseled on concepts specific to code status, artifical feeding and hydration, continued IV antibiotics and rehospitalization. She verbalized her understanding and shares that she will discuss further with her siblings before proceeding. Offered to finalize if ready prior to discharge, explaining that any provider could also assist in the outpatient setting as well. She is appreciative.   Questions and concerns addressed. PMT will continue to support holistically.   Length of Stay: 4   Physical Exam Vitals and nursing note reviewed.  Constitutional:      General: He is not in acute distress.    Appearance: He is ill-appearing.  Cardiovascular:     Rate and Rhythm: Normal rate.  Pulmonary:     Effort: Pulmonary effort is  normal. No respiratory distress.  Neurological:     Mental Status: He is alert.     Motor: Weakness present.  Psychiatric:        Mood and Affect: Mood normal.        Behavior: Behavior normal.        Cognition and Memory: Cognition is impaired.         Vital Signs: BP 105/66 (BP Location: Left Arm)   Pulse 88   Temp 99.3 F (37.4 C) (Oral)   Resp 16   Ht 5\' 10"  (1.778 m)   Wt 69.5 kg   SpO2 99%   BMI 21.98 kg/m  SpO2: SpO2: 99 % O2 Device: O2 Device: Nasal Cannula O2 Flow Rate: O2 Flow Rate (L/min): 2 L/min      Palliative Assessment/Data: 40% at best   Palliative Care Assessment & Plan   Patient Profile: 82 y.o. male  with past medical history of CHF, COPD, CAD, HTN, HLD, ITP and DM  admitted on 04/25/2023 with cough and SOB for 3-4 weeks.    Patient is admitted with acute hypoxic respiratory failure, acute CHF exacerbation and COPD exacerbation.  He has been previously seen by palliative care in an outpatient setting, last appointment around March 2024. PMT has been consulted to assist with goals of care conversation.  Assessment: Goals of care conversation Acute on chronic CHF COPD exacerbation Acute hypoxic respiratory failure Frailty and debility  Recommendations/Plan: Continue DNRDNI Continue current care plan Goal remains SNF placement with outpatient palliative care follow up, then transition  to hospice.  Encouraged MOST form completion. Family will reach out if ready to finalize MOST form prior to discharge Psychosocial and emotional support provided PMT will continue to follow and support as needed   Prognosis:  Weeks to months  Discharge Planning: Skilled Nursing Facility for rehab with Palliative care service follow-up  Care plan was discussed with patient, patient's daughter   Total time: I spent 35 minutes in the care of the patient today in the above activities and documenting the encounter.   Richardson Dopp, PA-C Palliative Medicine  Team Team phone # [phone]  Thank you for allowing the Palliative Medicine Team to assist in the care of this patient. Please utilize secure chat with additional questions, if there is no response within 30 minutes please call the above phone number.  Palliative Medicine Team providers are available by phone from 7am to 7pm daily and can be reached through the team cell phone.  Should this patient require assistance outside of these hours, please call the patient's attending physician.  Portions of this note are a verbal dictation therefore any spelling and/or grammatical errors are due to the "Dragon [employer] One" system interpretation.

## 2023-04-29 NOTE — Progress Notes (Signed)
Progress Note  Patient Name: [name] Date of Encounter: 04/29/2023  Primary Cardiologist:   Kristeen Miss, MD   Subjective   Very weak.  Cough and unable to bring up sputum.  He does not report that he is SOB.  No pain.   Inpatient Medications    Scheduled Meds:  clopidogrel  75 mg Oral Daily   digoxin  0.125 mg Oral Daily   enoxaparin (LOVENOX) injection  40 mg Subcutaneous Q24H   ezetimibe  10 mg Oral Daily   feeding supplement  237 mL Oral BID BM   furosemide  60 mg Oral BID   insulin aspart  0-5 Units Subcutaneous QHS   insulin aspart  0-9 Units Subcutaneous TID WC   melatonin  3 mg Oral QHS   metFORMIN  500 mg Oral BID WC   metoprolol succinate  12.5 mg Oral Daily   multivitamin with minerals  1 tablet Oral Daily   olopatadine  1 drop Both Eyes BID   umeclidinium-vilanterol  1 puff Inhalation Daily   Continuous Infusions:  PRN Meds: acetaminophen **OR** acetaminophen, guaiFENesin   Vital Signs    Vitals:   04/28/23 2000 04/28/23 2336 04/29/23 0330 04/29/23 0449  BP: 113/69 101/73 105/66   Pulse: (!) 101 85 88   Resp: 19 18 16    Temp: 98.4 F (36.9 C) 98.4 F (36.9 C) 99.3 F (37.4 C)   TempSrc: Oral Axillary Oral   SpO2: 98% 97% 99%   Weight:    69.5 kg  Height:        Intake/Output Summary (Last 24 hours) at 04/29/2023 0755 Last data filed at 04/29/2023 0300 Gross per 24 hour  Intake 597 ml  Output 900 ml  Net -303 ml   Filed Weights   04/27/23 0146 04/28/23 0421 04/29/23 0449  Weight: 70.5 kg 70.5 kg 69.5 kg    Telemetry    NSR, PACs, MAT - Personally Reviewed  ECG    NA - Personally Reviewed  Physical Exam   GEN: No acute distress.  Very frail Neck: No  JVD Cardiac: RRR, 3/6 apical systolic murmur, no diastolic murmurs, rubs, or gallops.  Respiratory:    Decreased breath sounds with scattered coarse crackles.  GI: Soft, nontender, non-distended  MS:    Trace ankle edema; No deformity. Neuro:  Nonfocal  Psych:  Normal affect   Labs    Chemistry Recent Labs  Lab 04/25/23 1236 04/25/23 1256 04/28/23 0327 04/28/23 1246 04/29/23 0246  NA 132*   < > 128* 133* 133*  K 5.0   < > 4.1 3.8 3.6  CL 100   < > 92* 96* 97*  CO2 19*   < > 24 24 24   GLUCOSE 244*   < > 490* 189* 222*  BUN 24*   < > 37* 38* 35*  CREATININE 1.25*   < > 1.35* 1.26* 1.17  CALCIUM 9.1   < > 9.2 9.6 9.1  PROT 6.9  --   --   --   --   ALBUMIN 2.9*  --   --   --   --   AST 43*  --   --   --   --   ALT 39  --   --   --   --   ALKPHOS 56  --   --   --   --   BILITOT 1.9*  --   --   --   --   [facility]  57*   < > 52* 56* >60  ANIONGAP 13   < > 12 13 12    < > = values in this interval not displayed.     Hematology Recent Labs  Lab 04/26/23 0323 04/27/23 0357 04/28/23 0327  WBC 9.5 10.2 10.0  RBC 2.84* 2.94* 3.17*  HGB 9.2* 9.5* 10.0*  HCT 27.2* 28.1* 29.7*  MCV 95.8 95.6 93.7  MCH 32.4 32.3 31.5  MCHC 33.8 33.8 33.7  RDW 13.9 13.8 14.0  PLT 206 212 218    Cardiac EnzymesNo results for input(s): "TROPONINI" in the last 168 hours. No results for input(s): "TROPIPOC" in the last 168 hours.   BNP Recent Labs  Lab 04/25/23 1236  BNP 1,878.8*     DDimer No results for input(s): "DDIMER" in the last 168 hours.   Radiology    No results found.  Cardiac Studies   Echo:  10/14   1. Left ventricular ejection fraction, by estimation, is 20 to 25%. The  left ventricle has severely decreased function. The left ventricle  demonstrates global hypokinesis. The left ventricular internal cavity size  was mildly dilated. Left ventricular  diastolic parameters are indeterminate.   2. Right ventricular systolic function is mildly reduced. The right  ventricular size is normal. There is moderately elevated pulmonary artery  systolic pressure. The estimated right ventricular systolic pressure is  55.2 mmHg.   3. Left atrial size was mildly dilated.   4. The mitral valve is abnormal. Moderate to severe mitral valve   regurgitation. Suspect functional MR, posterior leaflet is restricted. No  evidence of mitral stenosis.   5. The tricuspid valve is abnormal. Tricuspid valve regurgitation is  moderate.   6. The aortic valve is tricuspid. Aortic valve regurgitation is mild. No  aortic stenosis is present.   7. The inferior vena cava is dilated in size with <50% respiratory  variability, suggesting right atrial pressure of 15 mmHg.   Patient Profile     82 y.o. male with a hx of CAD s/p CABG 2007 maintained on Plavix, chronic HFrEF, moderate to severe MR and mild AI, HLD (intolerant of statins), multifocal atrial tach 01/2022, NSVT/PSVT/PACs/PVCs by monitor 09/2022, DM, COPD, BPH, hiatal hernia, thrombocytopenia, cystic lesion in upper abdomen by CT 01/2023, cardiology is following for CHF.     Assessment & Plan    Acute on chronic systolic heart failure:  EF is 20% as above.  Changed to PO Lasix yesterday.  Unable to titrate meds with low BP.  We are currently using a higher dose of Lasix than his home dose.  Do not resume vericiguat on discharge.  Resume previous 40 mg bid Lasix at discharge.   AKI:  Increase in serum creatinine by >=0.3 mg/dL (>=16.1 micromol/L) within 48 hours.  Patient met criteria this admission.  Follow creat as an out patient.    MAT/PVC/sinus tachycardia :  Tolerating low dose beta blocker.      Moderate-severe mitral regurgitation, mild aortic insufficiency:  Conservative management only with volume control the critical therapy.    CAD:  Medical management.  No active chest pain.  I would suggest continued Plavix unless he has active bleeding in the future.     Goals of care:  Palliative care involved.   Discussed with primary team and the plan apparently is skill nursing and then home with hospice?    [facility] will sign off.   Medication Recommendations:  As on MAR and above.  Other recommendations (labs,  testing, etc):   Follow up as an outpatient:  Follow up has  been scheduled.  See AVS.  If he is made comfort care please cancel these appts.   For questions or updates, please contact [facility] Please consult [email or web address] for contact info under Cardiology/STEMI.   Signed, Rollene Rotunda, MD  04/29/2023, 7:55 AM

## 2023-04-29 NOTE — Progress Notes (Signed)
Occupational Therapy Treatment Patient Details Name: [name] MRN: [id number] DOB: [birth year] Today's Date: 04/29/2023   History of present illness Pt is an 82 y.o. male presenting 10/13 from home with SOB 1 month. CXR negative for PNA. Admitted with acute on chronic CHF exacerbation. PMH includes: CAD s/p CABG, COPD, HLD, and DM II. CT cervical spine 2018: Severe degenerative disc disease at C6-7.   OT comments  Pt with slower progress towards OT goals due to significant weakness and quick fatigue with tasks. Focused session on progression of sitting balance and transfers OOB w/ significant +2 assist needed for all mobility. Pt with SOB though SpO2 WFL On 1 L O2 during session.       If plan is discharge home, recommend the following:  Two people to help with walking and/or transfers;Two people to help with bathing/dressing/bathroom;Direct supervision/assist for financial management;Help with stairs or ramp for entrance;Assist for transportation;Assistance with feeding;Direct supervision/assist for medications management;Assistance with Ecologist;Hospital bed    Recommendations for Other Services      Precautions / Restrictions Precautions Precautions: Fall Restrictions Weight Bearing Restrictions: No       Mobility Bed Mobility Overal bed mobility: Needs Assistance Bed Mobility: Supine to Sit     Supine to sit: Max assist, +2 for physical assistance, +2 for safety/equipment, HOB elevated, Used rails     General bed mobility comments: minimal initiation when cued (unsure if due to [facility], attention, etc). able to reach to bedrails to assist some but significant assist needed due to weakness    Transfers Overall transfer level: Needs assistance Equipment used: 2 person hand held assist Transfers: Sit to/from Stand, Bed to chair/wheelchair/BSC Sit to Stand: Mod assist, +2 physical assistance, +2 safety/equipment      Step pivot transfers: Max assist, +2 physical assistance, +2 safety/equipment     General transfer comment: Mod A x 2 for two standing trials with pt holding to back of therapist's elbows, bed pad to support lifting bottom. knee blocking to R knee needed due to buckling with stepping to recliner     Balance Overall balance assessment: Needs assistance Sitting-balance support: Single extremity supported, Bilateral upper extremity supported, Feet supported Sitting balance-Leahy Scale: Poor Sitting balance - Comments: R lateral lean EOB Postural control: Right lateral lean Standing balance support: Bilateral upper extremity supported, Reliant on assistive device for balance Standing balance-Leahy Scale: Poor                             ADL either performed or assessed with clinical judgement   ADL Overall ADL's : Needs assistance/impaired                                       General ADL Comments: Focus on progression OOB and sitting balance.    Extremity/Trunk Assessment Upper Extremity Assessment Upper Extremity Assessment: Right hand dominant;RUE deficits/detail;LUE deficits/detail RUE Deficits / Details: BUE shoulder decr AROM 0-10. BUE tremor, profound weakness. LUE Deficits / Details: BUE shoulder decr AROM 0-10. BUE tremor, profound weakness.   Lower Extremity Assessment Lower Extremity Assessment: Defer to PT evaluation        Vision   Vision Assessment?: No apparent visual deficits Additional Comments: appeared functional during tasks w/ glasses donned   Perception     Praxis      Cognition  Arousal: Alert Behavior During Therapy: Flat affect Overall Cognitive Status: Impaired/Different from baseline Area of Impairment: Following commands, Safety/judgement, Awareness, Problem solving, Attention, Memory                   Current Attention Level: Sustained Memory: Decreased short-term memory Following Commands: Follows one  step commands with increased time Safety/Judgement: Decreased awareness of safety, Decreased awareness of deficits Awareness: Intellectual Problem Solving: Slow processing, Decreased initiation, Difficulty sequencing General Comments: slow processing for cues for mobility, increased time after questions. limited by [facility] and difficult to fully assess. endorses memory problems        Exercises      Shoulder Instructions       General Comments Son entering during session, dietary to take order and nursing to give meds. Notified RN of OT attempts at certain time w/ pt cleared to work with therapies. SpO2 WFL on 1 L O2 though SOB and fatigue noted with activity    Pertinent Vitals/ Pain       Pain Assessment Pain Assessment: No/denies pain  Home Living                                          Prior Functioning/Environment              Frequency  Min 1X/week        Progress Toward Goals  OT Goals(current goals can now be found in the care plan section)  Progress towards OT goals: Progressing toward goals  Acute Rehab OT Goals Patient Stated Goal: increase strength, have the right amount of assist at home OT Goal Formulation: With patient Time For Goal Achievement: 05/10/23 Potential to Achieve Goals: Good ADL Goals Pt Will Perform Eating: with set-up;sitting Pt Will Perform Grooming: with set-up;sitting Pt Will Perform Upper Body Bathing: with set-up;sitting Pt Will Transfer to Toilet: stand pivot transfer;with min assist;bedside commode Pt/caregiver will Perform Home Exercise Program: Increased strength;Both right and left upper extremity  Plan      Co-evaluation                 AM-PAC OT "6 Clicks" Daily Activity     Outcome Measure   Help from another person eating meals?: A Little Help from another person taking care of personal grooming?: A Lot Help from another person toileting, which includes using toliet, bedpan, or urinal?:  Total Help from another person bathing (including washing, rinsing, drying)?: A Lot Help from another person to put on and taking off regular upper body clothing?: A Lot Help from another person to put on and taking off regular lower body clothing?: Total 6 Click Score: 11    End of Session Equipment Utilized During Treatment: Rolling walker (2 wheels);Oxygen  OT Visit Diagnosis: Unsteadiness on feet (R26.81);Muscle weakness (generalized) (M62.81);Other symptoms and signs involving cognitive function;Feeding difficulties (R63.3)   Activity Tolerance Patient limited by fatigue   Patient Left in chair;with call bell/phone within reach;with chair alarm set;with family/visitor present;with nursing/sitter in room   Nurse Communication Mobility status        Time: 6213-0865 OT Time Calculation (min): 22 min  Charges: OT General Charges $OT Visit: 1 Visit OT Treatments $Therapeutic Activity: 8-22 mins  Bradd Canary, OTR/L Acute Rehab Services Office: [phone]   Lorre Munroe 04/29/2023, 11:52 AM

## 2023-04-29 NOTE — TOC Progression Note (Signed)
Transition of Care [facility]) - Progression Note    Patient Details  Name: [name] MRN: [id number] Date of Birth: Feb 03, 1941  Transition of Care [facility]) CM/SW Contact  Dellie Burns [place], [place] Phone Number: 04/29/2023, 2:53 PM  Clinical Narrative: pt's dtr reports this AM that pt will likely need LTC after rehab. Spoke to [place] at [facility] who reports they are not able to accommodate LTC. Bed offer received from [facility] with [facility] and pt's dtr [name] has accepted. Confirmed rehab and LTC bed with [place] [facility] at [facility]. CMA to pursue Drucie Opitz for [facility] instead of [facility]. SW will follow.   Dellie Burns, MSW, LCSW [phone] (coverage)      Expected Discharge Plan: Skilled Nursing Facility Barriers to Discharge: Continued Medical Work up, English as a second language teacher, SNF Pending bed offer  Expected Discharge Plan and Services       Living arrangements for the past 2 months: Single Family Home                                       Social Determinants of Health (SDOH) Interventions SDOH Screenings   Food Insecurity: No Food Insecurity (04/25/2023)  Housing: Low Risk  (04/25/2023)  Transportation Needs: No Transportation Needs (04/25/2023)  Utilities: Not At Risk (04/25/2023)  Alcohol Screen: Low Risk  (01/26/2022)  Depression (PHQ2-9): Low Risk  (02/17/2023)  Financial Resource Strain: Low Risk  (01/26/2022)  Physical Activity: Sufficiently Active (08/19/2022)  Social Connections: Moderately Isolated (08/19/2022)  Stress: No Stress Concern Present (08/19/2022)  Tobacco Use: Low Risk  (04/25/2023)  Health Literacy: Adequate Health Literacy (02/17/2023)    Readmission Risk Interventions     No data to display

## 2023-04-29 NOTE — Assessment & Plan Note (Signed)
-  RD consult: Ensure Enlive PO BID, MVI with minerals daily, assistance with feeding, liberalize diet

## 2023-04-30 DIAGNOSIS — I5023 Acute on chronic systolic (congestive) heart failure: Secondary | ICD-10-CM

## 2023-04-30 DIAGNOSIS — Z515 Encounter for palliative care: Secondary | ICD-10-CM | POA: Diagnosis not present

## 2023-04-30 DIAGNOSIS — I509 Heart failure, unspecified: Secondary | ICD-10-CM

## 2023-04-30 LAB — GLUCOSE, CAPILLARY
Glucose-Capillary: 167 mg/dL — ABNORMAL HIGH (ref 70–99)
Glucose-Capillary: 190 mg/dL — ABNORMAL HIGH (ref 70–99)
Glucose-Capillary: 194 mg/dL — ABNORMAL HIGH (ref 70–99)

## 2023-04-30 MED ORDER — ORAL CARE MOUTH RINSE
15.0000 mL | OROMUCOSAL | Status: DC
  Administered 2023-04-30 – 2023-05-04 (×15): 15 mL via OROMUCOSAL

## 2023-04-30 MED ORDER — GLYCOPYRROLATE 0.2 MG/ML IJ SOLN
0.1000 mg | Freq: Four times a day (QID) | INTRAMUSCULAR | Status: DC | PRN
  Administered 2023-04-30: 0.1 mg via INTRAVENOUS
  Filled 2023-04-30: qty 1

## 2023-04-30 MED ORDER — ORAL CARE MOUTH RINSE: 15.0000 mL | OROMUCOSAL | Status: DC | PRN

## 2023-04-30 MED ORDER — REVEFENACIN 175 MCG/3ML IN SOLN
175.0000 ug | Freq: Every day | RESPIRATORY_TRACT | Status: DC
  Administered 2023-04-30 – 2023-05-04 (×5): 175 ug via RESPIRATORY_TRACT
  Filled 2023-04-30 (×5): qty 3

## 2023-04-30 MED ORDER — ARFORMOTEROL TARTRATE 15 MCG/2ML IN NEBU
15.0000 ug | INHALATION_SOLUTION | Freq: Two times a day (BID) | RESPIRATORY_TRACT | Status: DC
  Administered 2023-04-30 – 2023-05-04 (×9): 15 ug via RESPIRATORY_TRACT
  Filled 2023-04-30 (×8): qty 2

## 2023-04-30 NOTE — TOC Progression Note (Signed)
Transition of Care [facility]) - Progression Note    Patient Details  Name: [name] MRN: [id number] Date of Birth: 11-26-1940  Transition of Care [facility]) CM/SW Contact  Dellie Burns [place], [place] Phone Number: 04/30/2023, 9:46 AM  Clinical Narrative:  facility request changed successfully with [employer] from [facility] to [facility]. Per Drucie Opitz is still under review at this time. Will provide updates as available.   Dellie Burns, MSW, LCSW [phone] (coverage)       Expected Discharge Plan: Skilled Nursing Facility Barriers to Discharge: Continued Medical Work up, English as a second language teacher, SNF Pending bed offer  Expected Discharge Plan and Services       Living arrangements for the past 2 months: Single Family Home                                       Social Determinants of Health (SDOH) Interventions SDOH Screenings   Food Insecurity: No Food Insecurity (04/25/2023)  Housing: Low Risk  (04/25/2023)  Transportation Needs: No Transportation Needs (04/25/2023)  Utilities: Not At Risk (04/25/2023)  Alcohol Screen: Low Risk  (01/26/2022)  Depression (PHQ2-9): Low Risk  (02/17/2023)  Financial Resource Strain: Low Risk  (01/26/2022)  Physical Activity: Sufficiently Active (08/19/2022)  Social Connections: Moderately Isolated (08/19/2022)  Stress: No Stress Concern Present (08/19/2022)  Tobacco Use: Low Risk  (04/25/2023)  Health Literacy: Adequate Health Literacy (02/17/2023)    Readmission Risk Interventions     No data to display

## 2023-04-30 NOTE — Assessment & Plan Note (Signed)
Many recurrent falls over the past year. -PT/OT -Fall precautions

## 2023-04-30 NOTE — Assessment & Plan Note (Signed)
Iso lasix. Resolved.

## 2023-04-30 NOTE — Assessment & Plan Note (Signed)
-  RD consult: Ensure Enlive PO BID, MVI with minerals daily, assistance with feeding, liberalize diet

## 2023-04-30 NOTE — Assessment & Plan Note (Signed)
Per family and chart review, pt has a history of falls and previously seen by palliative care in an outpatient setting, last appointment around March 2024. Lives in a tiny home in daughter's backyard. Daughter, [name], is HCPOA but all children are very actively involved in their father's care. Kim manages his medications, which were confirmed today.  --palliative consult: SNF then home with hospice plan. DNR/DNI. --shared decision making on continuation of Plavix --PT/OT eval and treat --fall precautions

## 2023-04-30 NOTE — Assessment & Plan Note (Signed)
Cardiology consult: tachycardia due to suspected MAT, Sinus with PVCs with couplets. No afib noted however pt has history of afib with RVR. TSH wnl.  --continue metoprolol, unable to titrate, soft BP's

## 2023-04-30 NOTE — Assessment & Plan Note (Signed)
Meets 3/3 cardinal symptoms- increased purulence, sputum volume, dyspnea. CXR with increased pulmonary vascular congestion and rhonchorous lung sounds. COVID/flu/RSV and RVP negative. Most recent PFTs with FEV1/FVC of 67.5% in 2021. Not on any home inhalers or O2.  --changed Anoro ellipta inhaler to revefenacin nebulizer --azithromycin 500mg  PO x 3 days completed  --prednisone 40mg  Day discontinued due to high glucose --tessalon 200mg  PRN --guaiFENesin PRN --continuous pulse ox, maintain saturation 88-92%, sating at 100% however on O2 1 L [place] for comfort measures

## 2023-04-30 NOTE — Care Management Important Message (Signed)
Important Message  Patient Details  Name: [name] MRN: [id number] Date of Birth: Dec 22, 1940   Important Message Given:  Yes - Medicare IM     Dorena Bodo 04/30/2023, 3:22 PM

## 2023-04-30 NOTE — Assessment & Plan Note (Signed)
NYHA Class III-IV. Dyspnea and orthopnea with peripheral edema and bibasilar crackles on exam and BNP 1878. Most recent echo in Oct 2023 with EF 25-30%. Failed outpatient diuresis with PO lasix, though was only taking 20mg  BID vs prescribed 40mg  BID from PCP note on 10/1. TSH WNL, A1c 6.5. Repeat Echo (10/14): Left ventricular ejection fraction, by estimation, is 20 to 25%. Moderate to severe mitral valve regurgitation. Moderate tricuspid regurgitation.  --oral lasix 40 mg BID: ~1L urine OP yesterday. Weight down to 69.2 kg today. Elevated from 68 kg from discharge on 7/11.   --heart failure consult: no new changes. Do not resume vericiguat upon discharge.  --continue home digoxin, metoprolol --strict I/Os, daily standing weights

## 2023-04-30 NOTE — Progress Notes (Signed)
Physical Therapy Treatment Patient Details Name: [name] MRN: [id number] DOB: 1941/04/05 Today's Date: 04/30/2023   History of Present Illness Pt is an 82 y.o. male presenting 10/13 from home with SOB 1 month. CXR negative for PNA. Admitted with acute on chronic CHF exacerbation. PMH includes: CAD s/p CABG, COPD, HLD, and DM II. CT cervical spine 2018: Severe degenerative disc disease at C6-7.    PT Comments  Pt in bed upon arrival and agreeable to PT session. Worked on transfers, taking steps, and LE strength in today's session. Pt continues to need heavy physical assistance for bed mobility and transfers. Pt was able to take small steps with step pivot transfer with ModAx2. Pt continues to have heavy posterior lean upon standing with difficulty standing fully upright. Pt is progressing slowly towards goals. Acute PT to follow.      If plan is discharge home, recommend the following: A lot of help with walking and/or transfers;A lot of help with bathing/dressing/bathroom;Assistance with cooking/housework;Assistance with feeding;Direct supervision/assist for medications management;Direct supervision/assist for financial management;Assist for transportation;Help with stairs or ramp for entrance;Supervision due to cognitive status   Can travel by private vehicle     No        Precautions / Restrictions Precautions Precautions: Fall Restrictions Weight Bearing Restrictions: No     Mobility  Bed Mobility Overal bed mobility: Needs Assistance Bed Mobility: Supine to Sit     Supine to sit: Max assist, +2 for physical assistance, +2 for safety/equipment, HOB elevated, Used rails     General bed mobility comments: pt able to assist minimally with moving LE towards EOB. MaxAx2 for trunk elevation and completion of moving LE's off the bed. Multiple cues for sequencing    Transfers Overall transfer level: Needs assistance Equipment used: 2 person hand held assist, Rolling walker  (2 wheels) (x2 w/ RW, x1 2 person HHA) Transfers: Sit to/from Stand, Bed to chair/wheelchair/BSC Sit to Stand: Mod assist, +2 physical assistance, +2 safety/equipment   Step pivot transfers: +2 physical assistance, +2 safety/equipment, Mod assist       General transfer comment: ModAx2 for boost up, initial rise, and steadying. Pt improved with pt holding to back of therapist's elbows. Step pivot transfer with heavy posterior lean and multiple cues for sequencing, ModAx2. One instance of R knee buckling with last STS          Balance Overall balance assessment: Needs assistance Sitting-balance support: Single extremity supported, Bilateral upper extremity supported, Feet supported Sitting balance-Leahy Scale: Poor Sitting balance - Comments: occasional posterior lean Postural control: Posterior lean Standing balance support: Bilateral upper extremity supported, Reliant on assistive device for balance Standing balance-Leahy Scale: Poor         Cognition Arousal: Alert Behavior During Therapy: Flat affect Overall Cognitive Status: Impaired/Different from baseline Area of Impairment: Following commands, Safety/judgement, Awareness, Problem solving, Attention, Memory  Current Attention Level: Sustained Memory: Decreased short-term memory Following Commands: Follows one step commands with increased time Safety/Judgement: Decreased awareness of safety, Decreased awareness of deficits Awareness: Intellectual Problem Solving: Slow processing, Decreased initiation, Difficulty sequencing General Comments: slow processing for cues for mobility, requires multiple tactile and verbal cues, increased time after questions        Exercises  1 STS from recliner, ModAx2 with RW    General Comments General comments (skin integrity, edema, etc.): VSS on 1L        PT Goals (current goals can now be found in the care plan section) Progress towards PT  goals: Progressing toward goals     Frequency    Min 1X/week       AM-PAC PT "6 Clicks" Mobility   Outcome Measure  Help needed turning from your back to your side while in a flat bed without using bedrails?: A Lot Help needed moving from lying on your back to sitting on the side of a flat bed without using bedrails?: A Lot Help needed moving to and from a bed to a chair (including a wheelchair)?: A Lot Help needed standing up from a chair using your arms (e.g., wheelchair or bedside chair)?: A Lot Help needed to walk in hospital room?: Total Help needed climbing 3-5 steps with a railing? : Total 6 Click Score: 10    End of Session Equipment Utilized During Treatment: Gait belt Activity Tolerance: Patient tolerated treatment well Patient left: in chair;with call bell/phone within reach;with chair alarm set Nurse Communication: Mobility status PT Visit Diagnosis: Other abnormalities of gait and mobility (R26.89);Muscle weakness (generalized) (M62.81);History of falling (Z91.81)     Time: 1610-9604 PT Time Calculation (min) (ACUTE ONLY): 29 min  Charges:    $Therapeutic Exercise: 8-22 mins $Therapeutic Activity: 8-22 mins PT General Charges $$ ACUTE PT VISIT: 1 Visit                     Hilton Cork, PT, DPT Secure Chat Preferred  Rehab Office [phone]    Arturo Morton Brion Aliment 04/30/2023, 3:19 PM

## 2023-04-30 NOTE — Assessment & Plan Note (Signed)
Improved, prednisone was discontinued -metformin 1000 mg BID -SSI discontinued

## 2023-04-30 NOTE — Assessment & Plan Note (Signed)
Could be secondary to anemia of chronic disease, worsened with HF exacerbation and COPD. Stable around 9-10. No active blood loss.

## 2023-04-30 NOTE — Progress Notes (Addendum)
Daily Progress Note Intern Pager: [phone]  Patient name: [name] Medical record number: [id number] Date of birth: 04/26/1941 Age: 82 y.o. Gender: male  Primary Care Provider: Blane Ohara, MD Consultants: heart failure, palliative Code Status: full   Pt Overview and Major Events to Date:  10/13: admitted to FMTS   Assessment and Plan: [name] is a 82 y.o. male presenting with 3 weeks of cough and shortness of breath 2/2 CHF and COPD exacerbation.   Assessment & Plan Acute exacerbation of CHF (congestive heart failure) (HCC) NYHA Class III-IV. Dyspnea and orthopnea with peripheral edema and bibasilar crackles on exam and BNP 1878. Most recent echo in Oct 2023 with EF 25-30%. Failed outpatient diuresis with PO lasix, though was only taking 20mg  BID vs prescribed 40mg  BID from PCP note on 10/1. TSH WNL, A1c 6.5. Repeat Echo (10/14): Left ventricular ejection fraction, by estimation, is 20 to 25%. Moderate to severe mitral valve regurgitation. Moderate tricuspid regurgitation.  --oral lasix 40 mg BID: ~1L urine OP yesterday. Weight down to 69.2 kg today. Elevated from 68 kg from discharge on 7/11.   --heart failure consult: no new changes. Do not resume vericiguat upon discharge.  --continue home digoxin, metoprolol --strict I/Os, daily standing weights  COPD exacerbation (HCC) Meets 3/3 cardinal symptoms- increased purulence, sputum volume, dyspnea. CXR with increased pulmonary vascular congestion and rhonchorous lung sounds. COVID/flu/RSV and RVP negative. Most recent PFTs with FEV1/FVC of 67.5% in 2021. Not on any home inhalers or O2.  --changed Anoro ellipta inhaler to revefenacin nebulizer --azithromycin 500mg  PO x 3 days completed  --prednisone 40mg  Day discontinued due to high glucose --tessalon 200mg  PRN --guaiFENesin PRN --continuous pulse ox, maintain saturation 88-92%, sating at 100% however on O2 1 L [place] for comfort measures  Frailty Per family and chart  review, pt has a history of falls and previously seen by palliative care in an outpatient setting, last appointment around March 2024. Lives in a tiny home in daughter's backyard. Daughter, [name], is HCPOA but all children are very actively involved in their father's care. Kim manages his medications, which were confirmed today.  --palliative consult: SNF then home with hospice plan. DNR/DNI. --shared decision making on continuation of Plavix --PT/OT eval and treat --fall precautions EKG abnormality Cardiology consult: tachycardia due to suspected MAT, Sinus with PVCs with couplets. No afib noted however pt has history of afib with RVR. TSH wnl.  --continue metoprolol, unable to titrate, soft BP's Malnutrition of moderate degree -RD consult: Ensure Enlive PO BID, MVI with minerals daily, assistance with feeding, liberalize diet  Type 2 diabetes mellitus with hyperglycemia (HCC) Improved, prednisone was discontinued -metformin 1000 mg BID -SSI discontinued Frequent falls Many recurrent falls over the past year. -PT/OT -Fall precautions Normocytic anemia Could be secondary to anemia of chronic disease, worsened with HF exacerbation and COPD. Stable around 9-10. No active blood loss.  AKI (acute kidney injury) (HCC) Iso lasix. Resolved.   Chronic and Stable Issues: CAD s/p CABG x4: plavix 75mg  daily HLD: zetia BPH: consider adding flomax 0.4mg  if having difficulty Afib: history in 2023   FEN/GI: carb modified PPx: lovenox Dispo: SNF then home hospice  Subjective:  Pt denies SOB at rest however feels a little more tired today.   Objective: Temp:  [97.5 F (36.4 C)-98.8 F (37.1 C)] 98.1 F (36.7 C) (10/18 0425) Pulse Rate:  [46-99] 46 (10/18 0425) Resp:  [20-21] 20 (10/18 0425) BP: (98-113)/(59-73) 107/60 (10/18 0425) SpO2:  [93 %-  100 %] 100 % (10/18 0425) Weight:  [69.2 kg] 69.2 kg (10/18 0425)  Physical Exam: General: Alert, oriented in conversation, NAD, frail  appearing Cardiovascular: Tachycardiac and irregular, no murmur, Radial pulse palpable.  Respiratory: +wheezing, no rhonchi or rales. +wet cough. No respiratory distress. on O2 1 L [place] Abdomen: bowel sounds present Extremities: trace pitting edema in lower extremities b/l  Laboratory: Most recent CBC Lab Results  Component Value Date   WBC 10.0 04/28/2023   HGB 10.0 (L) 04/28/2023   HCT 29.7 (L) 04/28/2023   MCV 93.7 04/28/2023   PLT 218 04/28/2023   Most recent BMP    Latest Ref Rng & Units 04/29/2023    2:46 AM  BMP  Glucose 70 - 99 mg/dL 161   BUN 8 - 23 mg/dL 35   Creatinine 0.96 - 1.24 mg/dL 0.45   Sodium 409 - 811 mmol/L 133   Potassium 3.5 - 5.1 mmol/L 3.6   Chloride 98 - 111 mmol/L 97   CO2 22 - 32 mmol/L 24   Calcium 8.9 - 10.3 mg/dL 9.1    [name], Medical Student 04/30/2023, 7:42 AM  I was personally present and performed or re-performed the history, physical exam and medical decision making activities of this service and have verified that the service and findings are accurately documented in the student's note.  Janeal Holmes, MD                  04/30/2023, 1:33 PM

## 2023-05-01 DIAGNOSIS — I5023 Acute on chronic systolic (congestive) heart failure: Secondary | ICD-10-CM | POA: Diagnosis not present

## 2023-05-01 DIAGNOSIS — J441 Chronic obstructive pulmonary disease with (acute) exacerbation: Secondary | ICD-10-CM | POA: Diagnosis not present

## 2023-05-01 DIAGNOSIS — R54 Age-related physical debility: Secondary | ICD-10-CM | POA: Diagnosis not present

## 2023-05-01 DIAGNOSIS — R0602 Shortness of breath: Secondary | ICD-10-CM | POA: Diagnosis not present

## 2023-05-01 DIAGNOSIS — R509 Fever, unspecified: Secondary | ICD-10-CM | POA: Insufficient documentation

## 2023-05-01 LAB — GLUCOSE, CAPILLARY: Glucose-Capillary: 270 mg/dL — ABNORMAL HIGH (ref 70–99)

## 2023-05-01 NOTE — Assessment & Plan Note (Signed)
Febrile to 101 Fahrenheit.  Discussed with daughter [name], no plan for aggressive workup for fever (blood cultures, chest x-ray, urine culture).  Plan to treat symptomatically.  If he exhibits signs of infectious process such as dysuria, persistent fever, then we can readdress. -Monitor fever curve -Acetaminophen as needed for fever

## 2023-05-01 NOTE — Assessment & Plan Note (Signed)
Improved, prednisone was discontinued -metformin 1000 mg BID -SSI discontinued

## 2023-05-01 NOTE — Plan of Care (Signed)
[name]

## 2023-05-01 NOTE — Plan of Care (Signed)
Fever to 101 Fahrenheit earlier.  I called and discussed with patient's daughter [name] regarding workup for if fever) including chest x-ray, blood cultures, urine cultures), and we opted to hold off on a full aggressive workup at this time.  However, if he becomes hemodynamically unstable or is exhibit signs of discomfort or worsening of cough/respiratory status, could consider at that point in time.  Darral Dash, DO

## 2023-05-01 NOTE — Assessment & Plan Note (Addendum)
Still has cough, but overall improved. --revefenacin nebulizer --tessalon 200mg  PRN --guaiFENesin PRN --continuous pulse ox, maintain saturation 88-92%, on O2 0.5 L [place] for comfort measures

## 2023-05-01 NOTE — Assessment & Plan Note (Addendum)
Awaiting SNF and then home with home hospice. --palliative consult: SNF then home with hospice plan. DNR/DNI. --continue Plavix --PT/OT eval and treat --fall precautions

## 2023-05-01 NOTE — Assessment & Plan Note (Signed)
-  RD consult: Ensure Enlive PO BID, MVI with minerals daily, assistance with feeding, liberalize diet

## 2023-05-01 NOTE — Assessment & Plan Note (Deleted)
Iso lasix. Resolved.

## 2023-05-01 NOTE — Assessment & Plan Note (Addendum)
Resolved, euvolemic. --oral lasix 40 mg BID --heart failure consult: no new changes. Do not resume vericiguat upon discharge.  --continue home digoxin, metoprolol --strict I/Os, daily standing weights

## 2023-05-01 NOTE — Assessment & Plan Note (Deleted)
Could be secondary to anemia of chronic disease, worsened with HF exacerbation and COPD. Stable around 9-10. No active blood loss.

## 2023-05-01 NOTE — Plan of Care (Signed)

## 2023-05-01 NOTE — Assessment & Plan Note (Deleted)
Cardiology consult: tachycardia due to suspected MAT, Sinus with PVCs with couplets. No afib noted however pt has history of afib with RVR. TSH wnl.  --continue metoprolol, unable to titrate, soft BP's

## 2023-05-01 NOTE — Assessment & Plan Note (Deleted)
Many recurrent falls over the past year. -PT/OT -Fall precautions

## 2023-05-01 NOTE — Progress Notes (Addendum)
Daily Progress Note Intern Pager: [phone]  Patient name: [name] Medical record number: [id number] Date of birth: Nov 21, 1940 Age: 82 y.o. Gender: male  Primary Care Provider: Blane Ohara, MD Consultants: heart failure, palliative Code Status: full  Pt Overview and Major Events to Date:  10/13: admitted to FMTS   Assessment and Plan: [name] is a 82 y.o. male presenting with 3 weeks of cough and shortness of breath 2/2 CHF and COPD exacerbation.  Medically stable for SNF placement, pending insurance authorization. Assessment & Plan Frailty Awaiting SNF and then home with home hospice. --palliative consult: SNF then home with hospice plan. DNR/DNI. --continue Plavix --PT/OT eval and treat --fall precautions Fever Febrile to 101 Fahrenheit.  Discussed with daughter [name], no plan for aggressive workup for fever (blood cultures, chest x-ray, urine culture).  Plan to treat symptomatically.  If he exhibits signs of infectious process such as dysuria, persistent fever, then we can readdress. -Monitor fever curve -Acetaminophen as needed for fever Acute exacerbation of CHF (congestive heart failure) (HCC) Resolved, euvolemic. --oral lasix 40 mg BID --heart failure consult: no new changes. Do not resume vericiguat upon discharge.  --continue home digoxin, metoprolol --strict I/Os, daily standing weights  COPD exacerbation (HCC) Still has cough, but overall improved. --revefenacin nebulizer --tessalon 200mg  PRN --guaiFENesin PRN --continuous pulse ox, maintain saturation 88-92%, on O2 0.5 L [name] for comfort measures Malnutrition of moderate degree -RD consult: Ensure Enlive PO BID, MVI with minerals daily, assistance with feeding, liberalize diet  Type 2 diabetes mellitus with hyperglycemia (HCC) Improved, prednisone was discontinued -metformin 1000 mg BID -SSI discontinued   Chronic and Stable Issues: CAD s/p CABG x4: plavix 75mg  daily HLD: zetia BPH:  consider adding flomax 0.4mg  if having difficulty Afib: history in 2023   FEN/GI: carb modified PPx: lovenox Dispo: SNF then home hospice  Subjective:  Pt comfortable with no complaints. No SOB.  Objective: Temp:  [97 F (36.1 C)-98.3 F (36.8 C)] 98.3 F (36.8 C) (10/19 0439) Pulse Rate:  [46-103] 46 (10/19 0439) Resp:  [19-28] 24 (10/19 0439) BP: (98-110)/(56-68) 104/58 (10/19 0439) SpO2:  [93 %-100 %] 100 % (10/19 0439) Weight:  [71 kg] 71 kg (10/19 0439)  Physical Exam: General: Alert, oriented in conversation, NAD, frail appearing Cardiovascular: Tachycardiac and regular rhythm, no murmur Respiratory: no wheezing, rhonchi or rales. +wet cough. No respiratory distress. on O2 0.5 L [name] Abdomen: bowel sounds present, no TTP, flat, soft Extremities: trace pitting edema in LLE, 1+ pitting edema RLE  Laboratory: Most recent CBC Lab Results  Component Value Date   WBC 10.0 04/28/2023   HGB 10.0 (L) 04/28/2023   HCT 29.7 (L) 04/28/2023   MCV 93.7 04/28/2023   PLT 218 04/28/2023   Most recent BMP    Latest Ref Rng & Units 04/29/2023    2:46 AM  BMP  Glucose 70 - 99 mg/dL 865   BUN 8 - 23 mg/dL 35   Creatinine 7.84 - 1.24 mg/dL 6.96   Sodium 295 - 284 mmol/L 133   Potassium 3.5 - 5.1 mmol/L 3.6   Chloride 98 - 111 mmol/L 97   CO2 22 - 32 mmol/L 24   Calcium 8.9 - 10.3 mg/dL 9.1    Intake/Output Summary (Last 24 hours) at 05/01/2023 0725 Last data filed at 05/01/2023 0054 Gross per 24 hour  Intake 300 ml  Output 800 ml  Net -500 ml   Filed Weights   04/29/23 0449 04/30/23 0425 05/01/23 0439  Weight: 69.5 kg 69.2 kg 71 kg     Ronney Lion, Medical Student 05/01/2023, 7:19 AM  [facility] FPTS Intern pager: [phone], text pages welcome Secure chat group [facility] Teaching Service    Upper Level Addendum:  I have seen and evaluated this patient along with [facility] and reviewed the above note, making necessary revisions  within the note.  Darral Dash, DO 05/01/2023, 6:32 PM PGY-3, [facility]

## 2023-05-02 DIAGNOSIS — R0602 Shortness of breath: Secondary | ICD-10-CM | POA: Diagnosis not present

## 2023-05-02 LAB — GLUCOSE, CAPILLARY
Glucose-Capillary: 245 mg/dL — ABNORMAL HIGH (ref 70–99)
Glucose-Capillary: 249 mg/dL — ABNORMAL HIGH (ref 70–99)
Glucose-Capillary: 176 mg/dL — ABNORMAL HIGH (ref 70–99)
Glucose-Capillary: 208 mg/dL — ABNORMAL HIGH (ref 70–99)

## 2023-05-02 MED ORDER — INSULIN ASPART 100 UNIT/ML IJ SOLN
0.0000 [IU] | Freq: Three times a day (TID) | INTRAMUSCULAR | Status: DC
  Administered 2023-05-02: 3 [IU] via SUBCUTANEOUS
  Administered 2023-05-02: 2 [IU] via SUBCUTANEOUS
  Administered 2023-05-02 – 2023-05-03 (×2): 3 [IU] via SUBCUTANEOUS
  Administered 2023-05-03: 7 [IU] via SUBCUTANEOUS

## 2023-05-02 NOTE — Assessment & Plan Note (Addendum)
Likely secondary to anemia of chronic disease, worsened with HF exacerbation and COPD. Stable around 9-10.  -Repeat CBC if clinical changes

## 2023-05-02 NOTE — Plan of Care (Signed)

## 2023-05-02 NOTE — Progress Notes (Signed)
Notified MD during rounds that patient is coughing while eating/drinking. No further orders received at this time.

## 2023-05-02 NOTE — Assessment & Plan Note (Signed)
Many recurrent falls over the past year. -PT/OT -Fall precautions

## 2023-05-02 NOTE — Progress Notes (Signed)
Daily Progress Note Intern Pager: [phone]  Patient name: [name] Medical record number: [id number] Date of birth: 11-May-1941 Age: 82 y.o. Gender: male  Primary Care Provider: Blane Ohara, MD Consultants: heart failure, palliative Code Status: DNR - Limited  Pt Overview and Major Events to Date:  10/13 - Admitted to FMTS  Assessment and Plan:  [name] is a 82 y.o. male admitted for CHF and concomitant COPD exacerbation, now resolved and awaiting SNF placement. Patient is Medically Stable for SNF, pending insurance authorization. Pertinent PMH/PSH includes HFrEF, COPD, recurrent falls, cognitive decline, T2DM.  Assessment & Plan Frailty Awaiting SNF and then home with home hospice. --palliative consult: SNF then home with hospice plan. DNR/DNI. --continue Plavix --PT/OT eval and treat --fall precautions Fever Afebrile overnight. If he exhibits signs of infectious process, reediscuss treatment with patient's family (previously discussed, and monitored). -Monitor fever curve -Acetaminophen as needed for fever Acute exacerbation of CHF (congestive heart failure) (HCC) Continues to be euvolemic on exam today. --oral lasix 40 mg BID --heart failure consulted, recs appreciated --continue home digoxin, metoprolol --strict I/Os, daily standing weights  COPD exacerbation (HCC) Resolved.  --revefenacin nebulizer --tessalon 200mg  PRN --guaiFENesin PRN --continuous pulse ox, maintain saturation 88-92%, on O2 0.5 L [place] for comfort measures Malnutrition of moderate degree -RD consult: Ensure Enlive PO BID, MVI with minerals daily, assistance with feeding, liberalize diet  Type 2 diabetes mellitus with hyperglycemia (HCC) CBG 270 ON.  -metformin 1000 mg BID -Restart SSI -CBG with meals and at night Frequent falls Many recurrent falls over the past year. -PT/OT -Fall precautions Normocytic anemia Likely secondary to anemia of chronic disease, worsened with HF  exacerbation and COPD. Stable around 9-10.  -Repeat CBC if clinical changes   Chronic and Stable Issues: CAD s/p CABG x4: plavix 75mg  daily HLD: zetia BPH: consider adding flomax 0.4mg  if having difficulty Afib: history in 2023  FEN/GI: Carb modified PPx: Lovenox Dispo:SNF, then home with home hospice  Subjective:  NAEO. Patient sleeping comfortably. Wakes up to touch. Denies difficulty breathing or issues overnight.  Objective: Temp:  [97 F (36.1 C)-101.1 F (38.4 C)] 98.5 F (36.9 C) (10/19 1959) Pulse Rate:  [46-98] 93 (10/19 1959) Resp:  [18-32] 27 (10/19 1959) BP: (96-110)/(57-68) 105/58 (10/19 1959) SpO2:  [90 %-100 %] 99 % (10/19 1959) FiO2 (%):  [24 %] 24 % (10/19 2104) Weight:  [71 kg] 71 kg (10/19 0439) Physical Exam: General: NAD, lying comfortably in hospital bed Neuro: A&O Cardiovascular: RRR, apical systolic murmur, no JVD Respiratory: mildly increased WOB on 1L [place], decreased breath sounds at the lung bases Abdomen: soft, NTTP, no rebound or guarding Extremities: Moving all 4 extremities equally, no peripheral edema, warm well perfused   Laboratory: Most recent CBC Lab Results  Component Value Date   WBC 10.0 04/28/2023   HGB 10.0 (L) 04/28/2023   HCT 29.7 (L) 04/28/2023   MCV 93.7 04/28/2023   PLT 218 04/28/2023   Most recent BMP    Latest Ref Rng & Units 04/29/2023    2:46 AM  BMP  Glucose 70 - 99 mg/dL 147   BUN 8 - 23 mg/dL 35   Creatinine 8.29 - 1.24 mg/dL 5.62   Sodium 130 - 865 mmol/L 133   Potassium 3.5 - 5.1 mmol/L 3.6   Chloride 98 - 111 mmol/L 97   CO2 22 - 32 mmol/L 24   Calcium 8.9 - 10.3 mg/dL 9.1     Imaging/Diagnostic Tests: Radiologist Impression:  None   Celine Mans, MD 05/02/2023, 12:21 AM  PGY-2, [facility] FPTS Intern pager: [phone], text pages welcome Secure chat group [facility] Teaching Service

## 2023-05-02 NOTE — Assessment & Plan Note (Addendum)
Afebrile overnight. If he exhibits signs of infectious process, reediscuss treatment with patient's family (previously discussed, and monitored). -Monitor fever curve -Acetaminophen as needed for fever

## 2023-05-02 NOTE — Assessment & Plan Note (Addendum)
Continues to be euvolemic on exam today. --oral lasix 40 mg BID --heart failure consulted, recs appreciated --continue home digoxin, metoprolol --strict I/Os, daily standing weights

## 2023-05-02 NOTE — Assessment & Plan Note (Signed)
Awaiting SNF and then home with home hospice. --palliative consult: SNF then home with hospice plan. DNR/DNI. --continue Plavix --PT/OT eval and treat --fall precautions

## 2023-05-02 NOTE — Assessment & Plan Note (Signed)
-  RD consult: Ensure Enlive PO BID, MVI with minerals daily, assistance with feeding, liberalize diet

## 2023-05-02 NOTE — Assessment & Plan Note (Addendum)
CBG 270 ON.  -metformin 1000 mg BID -Restart SSI -CBG with meals and at night

## 2023-05-02 NOTE — Progress Notes (Signed)
Notified Dr. Hyacinth Meeker that no bowel movement since 04/25/23. No further orders received at this time.

## 2023-05-02 NOTE — Assessment & Plan Note (Addendum)
Resolved.  --revefenacin nebulizer --tessalon 200mg  PRN --guaiFENesin PRN --continuous pulse ox, maintain saturation 88-92%, on O2 0.5 L [place] for comfort measures

## 2023-05-03 ENCOUNTER — Inpatient Hospital Stay (HOSPITAL_COMMUNITY): Payer: Medicare HMO

## 2023-05-03 DIAGNOSIS — R0602 Shortness of breath: Secondary | ICD-10-CM | POA: Diagnosis not present

## 2023-05-03 LAB — GLUCOSE, CAPILLARY
Glucose-Capillary: 229 mg/dL — ABNORMAL HIGH (ref 70–99)
Glucose-Capillary: 306 mg/dL — ABNORMAL HIGH (ref 70–99)
Glucose-Capillary: 260 mg/dL — ABNORMAL HIGH (ref 70–99)
Glucose-Capillary: 256 mg/dL — ABNORMAL HIGH (ref 70–99)

## 2023-05-03 MED ORDER — POLYETHYLENE GLYCOL 3350 17 G PO PACK
17.0000 g | PACK | Freq: Every day | ORAL | Status: DC
  Administered 2023-05-03 – 2023-05-04 (×2): 17 g via ORAL
  Filled 2023-05-03 (×2): qty 1

## 2023-05-03 NOTE — Progress Notes (Signed)
Nutrition Follow-up  DOCUMENTATION CODES:   Non-severe (moderate) malnutrition in context of chronic illness  INTERVENTION:  Assistance with feeding Continue regular to promote adequate PO intake in the setting of moderate malnutrition, advanced age and increased nutrition needs Ensure Enlive po BID, each supplement provides 350 kcal and 20 grams of protein. Magic cup TID with meals, each supplement provides 290 kcal and 9 grams of protein MVI with minerals daily  NUTRITION DIAGNOSIS:   Moderate Malnutrition related to chronic illness (COPD, CHF, CAD) as evidenced by severe fat depletion, moderate muscle depletion, edema. - remains applicable  GOAL:   Patient will meet greater than or equal to 90% of their needs - goal unmet  MONITOR:   PO intake, Supplement acceptance, Labs, Weight trends  REASON FOR ASSESSMENT:   Consult Assessment of nutrition requirement/status  ASSESSMENT:   Pt admitted with SOB and cough. PMH significant for CHF, COPD, afib, CAD, T2DM  COPD exacerbation resolved. Euvolemic  Medically stable for d/c to SNF with palliative care. Per PMT, plans to transition to hospice at LTC versus home.   Attempted to check in with pt at  bedside however pt receiving bedside care at that time.   Per documentation of meal completions, pt's intake remains limited. Average po intake 28% x 8 recorded meals (10/19-10/21).  Pt continues receiving ensure BID. Unclear amount consumed of each supplement.    Medications: lasix 40mg  BID, SSI 0-9 units TID, melatonin, metformin  Labs: CBG's 176-249 x24 hours  Diet Order:   Diet Order             Diet regular Room service appropriate? Yes with Assist; Fluid consistency: Thin  Diet effective now                   EDUCATION NEEDS:   Education needs have been addressed  Skin:  Skin Assessment: Reviewed RN Assessment  Last BM:  10/21 (x2 -type 6, type 4 large)  Height:   Ht Readings from Last 1  Encounters:  04/25/23 5\' 10"  (1.778 m)    Weight:   Wt Readings from Last 1 Encounters:  05/03/23 69.3 kg   BMI:  Body mass index is 21.92 kg/m.  Estimated Nutritional Needs:   Kcal:  1800-2000  Protein:  90-105g  Fluid:  >/=1.8L  Drusilla Kanner, RDN, LDN Clinical Nutrition

## 2023-05-03 NOTE — Progress Notes (Signed)
   Medical records reviewed including progress notes, labs and imaging. Goals of care remain clear for discharge to SNF with outpatient palliative care follow up, then transition to hospice either at LTC facility or at home based on family ability to acquire additional caregiver support in the home setting.  PMT will continue to follow peripherally. Family has been encouraged to reach out with additional needs should they arise.  Thank you for your referral and allowing PMT to assist in [name]'s care.   Richardson Dopp, [facility] Palliative Medicine Team  Team Phone # [phone]   NO CHARGE

## 2023-05-03 NOTE — TOC Progression Note (Addendum)
Transition of Care [facility]) - Progression Note    Patient Details  Name: [name] MRN: [id number] Date of Birth: [birth year]  Transition of Care [facility]) CM/SW Contact  Eduard Roux, [place] Phone Number: 05/03/2023, 3:00 PM  Clinical Narrative:     Received insurance authorization- # [id number] [id number]    [facility] can admit tomorrow if stable.   TOC will continue to follow and assist with discharge planning.  Antony Blackbird, MSW, LCSW Clinical Social Worker    Expected Discharge Plan: Skilled Nursing Facility Barriers to Discharge: Continued Medical Work up, English as a second language teacher, SNF Pending bed offer  Expected Discharge Plan and Services       Living arrangements for the past 2 months: Single Family Home                                       Social Determinants of Health (SDOH) Interventions SDOH Screenings   Food Insecurity: No Food Insecurity (04/25/2023)  Housing: Low Risk  (04/25/2023)  Transportation Needs: No Transportation Needs (04/25/2023)  Utilities: Not At Risk (04/25/2023)  Alcohol Screen: Low Risk  (01/26/2022)  Depression (PHQ2-9): Low Risk  (02/17/2023)  Financial Resource Strain: Low Risk  (01/26/2022)  Physical Activity: Sufficiently Active (08/19/2022)  Social Connections: Moderately Isolated (08/19/2022)  Stress: No Stress Concern Present (08/19/2022)  Tobacco Use: Low Risk  (04/25/2023)  Health Literacy: Adequate Health Literacy (02/17/2023)    Readmission Risk Interventions     No data to display

## 2023-05-03 NOTE — Assessment & Plan Note (Signed)
-  RD consult: Ensure Enlive PO BID, MVI with minerals daily, assistance with feeding, liberalize diet

## 2023-05-03 NOTE — Progress Notes (Signed)
Physical Therapy Treatment Patient Details Name: [name] MRN: [id number] DOB: [birth year] Today's Date: 05/03/2023   History of Present Illness Pt is an 82 y.o. male presenting 10/13 from home with SOB 1 month. CXR negative for PNA. Admitted with acute on chronic CHF exacerbation. PMH includes: CAD s/p CABG, COPD, HLD, and DM II. CT cervical spine 2018: Severe degenerative disc disease at C6-7.    PT Comments  Pt admitted with above diagnosis. Pt was able to stand to RW with max assist of 2. Once up, needing mod assist for safety to stand with very flexed posture. Could not take steps. Agree with post acute Rehab < 3 hours day.  Pt currently with functional limitations due to the deficits listed below (see PT Problem List). Pt will benefit from acute skilled PT to increase their independence and safety with mobility to allow discharge.       If plan is discharge home, recommend the following: A lot of help with walking and/or transfers;A lot of help with bathing/dressing/bathroom;Assistance with cooking/housework;Assistance with feeding;Direct supervision/assist for medications management;Direct supervision/assist for financial management;Assist for transportation;Help with stairs or ramp for entrance;Supervision due to cognitive status   Can travel by private vehicle     No  Equipment Recommendations  Wheelchair (measurements PT);Wheelchair cushion (measurements PT);BSC/3in1    Recommendations for Other Services       Precautions / Restrictions Precautions Precautions: Fall Restrictions Weight Bearing Restrictions: No     Mobility  Bed Mobility               General bed mobility comments: pt in chair on arrival    Transfers Overall transfer level: Needs assistance Equipment used: Rolling walker (2 wheels) (x2 w/ RW) Transfers: Sit to/from Stand, Bed to chair/wheelchair/BSC Sit to Stand: +2 physical assistance, Max assist           General transfer comment:  MaxAx2 for boost up, initial rise, and steadying. Pt with flexed knees and hips as well as flexed trunk with posteror lean needing cues to stand tall and difficulty for pt to maintain without constant verbal and tactile cues. Stood for up to 1 min x 2 and pt too fatigued to stand again.    Ambulation/Gait               General Gait Details: Could not take steps   Stairs             Wheelchair Mobility     Tilt Bed    Modified Rankin (Stroke Patients Only)       Balance         Postural control: Posterior lean Standing balance support: Bilateral upper extremity supported, Reliant on assistive device for balance Standing balance-Leahy Scale: Poor Standing balance comment: mod-maxA of 2 with heavy posterior lean                            Cognition Arousal: Alert Behavior During Therapy: Flat affect Overall Cognitive Status: Impaired/Different from baseline Area of Impairment: Following commands, Safety/judgement, Awareness, Problem solving, Attention, Memory                   Current Attention Level: Sustained Memory: Decreased short-term memory Following Commands: Follows one step commands with increased time Safety/Judgement: Decreased awareness of safety, Decreased awareness of deficits Awareness: Intellectual Problem Solving: Slow processing, Decreased initiation, Difficulty sequencing General Comments: slow processing for cues for mobility, requires multiple tactile and verbal  cues, increased time after questions        Exercises General Exercises - Lower Extremity Ankle Circles/Pumps: AROM, Supine, Both, 10 reps (limited ROM) Long Arc Quad: AROM, Both, 5 reps, Seated Heel Slides: AAROM, Both, 5 reps, Supine    General Comments General comments (skin integrity, edema, etc.): VSS on 2L      Pertinent Vitals/Pain Pain Assessment Pain Assessment: No/denies pain Breathing: normal Negative Vocalization: none Facial Expression:  smiling or inexpressive Body Language: relaxed Consolability: no need to console PAINAD Score: 0    Home Living                          Prior Function            PT Goals (current goals can now be found in the care plan section) Acute Rehab PT Goals Patient Stated Goal: to return to ambulation Progress towards PT goals: Progressing toward goals    Frequency    Min 1X/week      PT Plan      Co-evaluation              AM-PAC PT "6 Clicks" Mobility   Outcome Measure  Help needed turning from your back to your side while in a flat bed without using bedrails?: A Lot Help needed moving from lying on your back to sitting on the side of a flat bed without using bedrails?: A Lot Help needed moving to and from a bed to a chair (including a wheelchair)?: A Lot Help needed standing up from a chair using your arms (e.g., wheelchair or bedside chair)?: Total Help needed to walk in hospital room?: Total Help needed climbing 3-5 steps with a railing? : Total 6 Click Score: 9    End of Session Equipment Utilized During Treatment: Gait belt;Oxygen Activity Tolerance: Patient limited by fatigue Patient left: in chair;with call bell/phone within reach;with chair alarm set Nurse Communication: Mobility status;Need for lift equipment PT Visit Diagnosis: Other abnormalities of gait and mobility (R26.89);Muscle weakness (generalized) (M62.81);History of falling (Z91.81)     Time: 1308-6578 PT Time Calculation (min) (ACUTE ONLY): 17 min  Charges:    $Therapeutic Activity: 8-22 mins PT General Charges $$ ACUTE PT VISIT: 1 Visit                     Teffany Blaszczyk M,PT Acute Rehab Services [phone]    Bevelyn Buckles 05/03/2023, 3:49 PM

## 2023-05-03 NOTE — Plan of Care (Signed)
  Problem: Clinical Measurements: Goal: Will remain free from infection Outcome: Progressing Goal: Respiratory complications will improve Outcome: Progressing Goal: Cardiovascular complication will be avoided Outcome: Progressing   Problem: Activity: Goal: Risk for activity intolerance will decrease Outcome: Progressing   Problem: Nutrition: Goal: Adequate nutrition will be maintained Outcome: Progressing   Problem: Elimination: Goal: Will not experience complications related to bowel motility Outcome: Progressing Goal: Will not experience complications related to urinary retention Outcome: Progressing   Problem: Pain Managment: Goal: General experience of comfort will improve Outcome: Progressing   Problem: Safety: Goal: Ability to remain free from injury will improve Outcome: Progressing

## 2023-05-03 NOTE — Progress Notes (Addendum)
Daily Progress Note Intern Pager: [phone]  Patient name: [name] Medical record number: [id number] Date of birth: [birth year] Age: 82 y.o. Gender: male  Primary Care Provider: Blane Ohara, MD Consultants: heart failure, palliative Code Status: DNR - Limited   Pt Overview and Major Events to Date:  10/13 - Admitted to FMTS   Assessment and Plan:   [name] is a 82 y.o. male admitted for CHF and concomitant COPD exacerbation, now resolved and awaiting SNF placement. Patient is Medically Stable for SNF, pending insurance authorization. Pertinent PMH/PSH includes HFrEF, COPD, recurrent falls, cognitive decline, T2DM.  Assessment & Plan Frailty Awaiting SNF and then transition to hospice either at LTC facility or at home based on family ability to acquire additional caregiver support in the home setting. LUE weakness differential: rotator cuff tear vs arthritis vs global weakness/frailty vs stroke. Last CT head in July was negative. CXR on admission shows left [facility] joint arthritic changes. Likely rotator cuff pathology due to physical exam.  --palliative consult --continue Plavix --PT/OT eval and treat --fall precautions Fever Resolved. Afebrile >48 hours. Per family, work-up not pursued. Some diminished breath sounds on RLL and patient endorses "choking" episodes while eating. Consider CXR to assess possible aspiration pneumonia pending family discussion. -Monitor fever curve -Acetaminophen as needed for fever Acute exacerbation of CHF (congestive heart failure) (HCC) Euvolemic on exam today. Echo on 10/14 shows moderate-severe mitral regurgitation which could be likely cause of murmur.  --oral lasix 40 mg BID --heart failure consulted, recs appreciated --continue home digoxin, metoprolol --strict I/Os, daily standing weights  COPD exacerbation (HCC) Resolved.  --revefenacin nebulizer --guaiFENesin PRN --continuous pulse ox, maintain saturation 88-92% however  can give O2 for comfort measures Malnutrition of moderate degree -RD consult: Ensure Enlive PO BID, MVI with minerals daily, assistance with feeding, liberalize diet  Type 2 diabetes mellitus with hyperglycemia (HCC) CBG low 200's.  -metformin 1000 mg BID -SSI: Discuss with family on whether to continue.  -CBG with meals and at night Frequent falls Many recurrent falls over the past year. -PT/OT -Fall precautions Normocytic anemia 2/2 anemia of chronic disease. Stable around 9-10.  -Repeat CBC if clinical changes  Chronic and Stable Issues: CAD s/p CABG x4: plavix 75mg  daily HLD: zetia BPH: consider adding flomax 0.4mg  if having difficulty Afib: history in 2023 MAT/PVC/sinus tachycardia: Tolerating low dose beta blocker.   Moderate-severe mitral regurgitation, mild aortic insufficiency:  Conservative management only with volume control    FEN/GI: Carb modified, added Miralax  PPx: Lovenox Dispo:SNF, then home with home hospice  Subjective:  Pt denies SOB, chills, sweating, headache, pain, or dysuria. Pt reports last bowel movement was yesterday. Pt c/o cough.   SSI was added back on.   Objective: Temp:  [98.2 F (36.8 C)-98.7 F (37.1 C)] 98.3 F (36.8 C) (10/21 0705) Pulse Rate:  [89-108] 102 (10/21 0705) Resp:  [16-20] 16 (10/21 0705) BP: (93-113)/(58-71) 104/71 (10/21 0705) SpO2:  [96 %-100 %] 98 % (10/21 0705) Weight:  [69.3 kg] 69.3 kg (10/21 0427) Physical Exam: General: NAD, lying comfortably in hospital bed, looks well appearing, no diaphoresis Neuro: A&Ox2, limited secondary to difficulty with following commands/comprehension, strength 2/5 b/l lower extremities, limited flexion to less than 45 degrees and weakness in LUE compared to RUE, able to move left shoulder but unable to hold left arm up past 30 degrees, intact grip strength b/l, sensation intact in face and lower extremities, decreased peripheral vision, CN VIII and XI intact, no facial  droop Cardiovascular: +tachycardia, regular rhythm, grade III apical systolic murmur, no JVD, RP palpable Respiratory: no respiratory distress, on [place], no wheezing or rales, +wet cough with no sputum production, +diminished right lower lung sounds  Abdomen: soft, NTTP, no rebound or guarding Extremities: trace pitting peripheral edema, warm well perfused, no left shoulder TTP  Laboratory: Most recent CBC Lab Results  Component Value Date   WBC 10.0 04/28/2023   HGB 10.0 (L) 04/28/2023   HCT 29.7 (L) 04/28/2023   MCV 93.7 04/28/2023   PLT 218 04/28/2023   Most recent BMP    Latest Ref Rng & Units 04/29/2023    2:46 AM  BMP  Glucose 70 - 99 mg/dL 161   BUN 8 - 23 mg/dL 35   Creatinine 0.96 - 1.24 mg/dL 0.45   Sodium 409 - 811 mmol/L 133   Potassium 3.5 - 5.1 mmol/L 3.6   Chloride 98 - 111 mmol/L 97   CO2 22 - 32 mmol/L 24   Calcium 8.9 - 10.3 mg/dL 9.1     [name], Medical Student 05/03/2023, 8:15 AM  [facility] FPTS Intern pager: [phone], text pages welcome Secure chat group [facility] Teaching Service    I was personally present and performed or re-performed the history, physical exam and medical decision making activities of this service and have verified that the service and findings are accurately documented in the student's note.  Tiffany Kocher, DO                  05/03/2023, 1:11 PM

## 2023-05-03 NOTE — Assessment & Plan Note (Signed)
Resolved.  --revefenacin nebulizer --guaiFENesin PRN --continuous pulse ox, maintain saturation 88-92% however can give O2 for comfort measures

## 2023-05-03 NOTE — Assessment & Plan Note (Signed)
Many recurrent falls over the past year. -PT/OT -Fall precautions

## 2023-05-03 NOTE — Assessment & Plan Note (Addendum)
2/2 anemia of chronic disease. Stable around 9-10.  -Repeat CBC if clinical changes

## 2023-05-03 NOTE — Plan of Care (Signed)
  Problem: Clinical Measurements: Goal: Will remain free from infection Outcome: Progressing Goal: Diagnostic test results will improve Outcome: Progressing Goal: Cardiovascular complication will be avoided Outcome: Progressing   Problem: Coping: Goal: Level of anxiety will decrease Outcome: Progressing   Problem: Elimination: Goal: Will not experience complications related to bowel motility Outcome: Progressing Goal: Will not experience complications related to urinary retention Outcome: Progressing   Problem: Pain Managment: Goal: General experience of comfort will improve Outcome: Progressing   Problem: Safety: Goal: Ability to remain free from injury will improve Outcome: Progressing   Problem: Skin Integrity: Goal: Risk for impaired skin integrity will decrease Outcome: Progressing   

## 2023-05-03 NOTE — Assessment & Plan Note (Addendum)
Resolved. Afebrile >48 hours. Per family, work-up not pursued. Some diminished breath sounds on RLL and patient endorses "choking" episodes while eating. Consider CXR to assess possible aspiration pneumonia pending family discussion. -Monitor fever curve -Acetaminophen as needed for fever

## 2023-05-03 NOTE — TOC Progression Note (Signed)
Transition of Care [facility]) - Progression Note    Patient Details  Name: [name] MRN: [id number] Date of Birth: 02/06/1941  Transition of Care [facility]) CM/SW Contact  Eduard Roux, [place] Phone Number: 05/03/2023, 11:44 AM  Clinical Narrative:     Berkley Harvey for [facility] still pending.  TOC will continue to follow and assist with discharge planning.  Antony Blackbird, MSW, LCSW Clinical Social Worker    Expected Discharge Plan: Skilled Nursing Facility Barriers to Discharge: Continued Medical Work up, English as a second language teacher, SNF Pending bed offer  Expected Discharge Plan and Services       Living arrangements for the past 2 months: Single Family Home                                       Social Determinants of Health (SDOH) Interventions SDOH Screenings   Food Insecurity: No Food Insecurity (04/25/2023)  Housing: Low Risk  (04/25/2023)  Transportation Needs: No Transportation Needs (04/25/2023)  Utilities: Not At Risk (04/25/2023)  Alcohol Screen: Low Risk  (01/26/2022)  Depression (PHQ2-9): Low Risk  (02/17/2023)  Financial Resource Strain: Low Risk  (01/26/2022)  Physical Activity: Sufficiently Active (08/19/2022)  Social Connections: Moderately Isolated (08/19/2022)  Stress: No Stress Concern Present (08/19/2022)  Tobacco Use: Low Risk  (04/25/2023)  Health Literacy: Adequate Health Literacy (02/17/2023)    Readmission Risk Interventions     No data to display

## 2023-05-03 NOTE — Assessment & Plan Note (Addendum)
Euvolemic on exam today. Echo on 10/14 shows moderate-severe mitral regurgitation which could be likely cause of murmur.  --oral lasix 40 mg BID --heart failure consulted, recs appreciated --continue home digoxin, metoprolol --strict I/Os, daily standing weights

## 2023-05-03 NOTE — Progress Notes (Signed)
Called family to confirm goals of care. Spoke with daughter [name], confirmed identity.  Family agreeable to stopping CBGs. Family agreeable to CXR, if pneumonia or pulmonary edema they would like treatment. Family would like to continue lovenox. Family is hopeful for recovery at SNF. They understand poor prognosis, and are considering hospice if he does not improve.

## 2023-05-03 NOTE — Assessment & Plan Note (Signed)
CBG low 200's.  -metformin 1000 mg BID -SSI: Discuss with family on whether to continue.  -CBG with meals and at night

## 2023-05-03 NOTE — Assessment & Plan Note (Signed)
Awaiting SNF and then transition to hospice either at LTC facility or at home based on family ability to acquire additional caregiver support in the home setting. LUE weakness differential: rotator cuff tear vs arthritis vs global weakness/frailty vs stroke. Last CT head in July was negative. CXR on admission shows left [facility] joint arthritic changes. Likely rotator cuff pathology due to physical exam.  --palliative consult --continue Plavix --PT/OT eval and treat --fall precautions

## 2023-05-04 DIAGNOSIS — D649 Anemia, unspecified: Secondary | ICD-10-CM | POA: Diagnosis not present

## 2023-05-04 DIAGNOSIS — E785 Hyperlipidemia, unspecified: Secondary | ICD-10-CM | POA: Diagnosis not present

## 2023-05-04 DIAGNOSIS — Z7401 Bed confinement status: Secondary | ICD-10-CM | POA: Diagnosis not present

## 2023-05-04 DIAGNOSIS — J9601 Acute respiratory failure with hypoxia: Secondary | ICD-10-CM | POA: Diagnosis not present

## 2023-05-04 DIAGNOSIS — E0842 Diabetes mellitus due to underlying condition with diabetic polyneuropathy: Secondary | ICD-10-CM | POA: Diagnosis not present

## 2023-05-04 DIAGNOSIS — Z951 Presence of aortocoronary bypass graft: Secondary | ICD-10-CM | POA: Diagnosis not present

## 2023-05-04 DIAGNOSIS — I5041 Acute combined systolic (congestive) and diastolic (congestive) heart failure: Secondary | ICD-10-CM | POA: Diagnosis not present

## 2023-05-04 DIAGNOSIS — E440 Moderate protein-calorie malnutrition: Secondary | ICD-10-CM | POA: Diagnosis not present

## 2023-05-04 DIAGNOSIS — I2510 Atherosclerotic heart disease of native coronary artery without angina pectoris: Secondary | ICD-10-CM | POA: Diagnosis not present

## 2023-05-04 DIAGNOSIS — R296 Repeated falls: Secondary | ICD-10-CM | POA: Diagnosis not present

## 2023-05-04 DIAGNOSIS — J441 Chronic obstructive pulmonary disease with (acute) exacerbation: Secondary | ICD-10-CM | POA: Diagnosis not present

## 2023-05-04 DIAGNOSIS — Z9181 History of falling: Secondary | ICD-10-CM | POA: Diagnosis not present

## 2023-05-04 DIAGNOSIS — I5023 Acute on chronic systolic (congestive) heart failure: Secondary | ICD-10-CM | POA: Diagnosis not present

## 2023-05-04 DIAGNOSIS — I25118 Atherosclerotic heart disease of native coronary artery with other forms of angina pectoris: Secondary | ICD-10-CM | POA: Diagnosis not present

## 2023-05-04 DIAGNOSIS — R410 Disorientation, unspecified: Secondary | ICD-10-CM | POA: Diagnosis not present

## 2023-05-04 DIAGNOSIS — R001 Bradycardia, unspecified: Secondary | ICD-10-CM | POA: Diagnosis not present

## 2023-05-04 DIAGNOSIS — N401 Enlarged prostate with lower urinary tract symptoms: Secondary | ICD-10-CM | POA: Diagnosis not present

## 2023-05-04 DIAGNOSIS — R0602 Shortness of breath: Secondary | ICD-10-CM | POA: Diagnosis not present

## 2023-05-04 DIAGNOSIS — E0851 Diabetes mellitus due to underlying condition with diabetic peripheral angiopathy without gangrene: Secondary | ICD-10-CM | POA: Diagnosis not present

## 2023-05-04 DIAGNOSIS — E119 Type 2 diabetes mellitus without complications: Secondary | ICD-10-CM | POA: Diagnosis not present

## 2023-05-04 DIAGNOSIS — R54 Age-related physical debility: Secondary | ICD-10-CM | POA: Diagnosis not present

## 2023-05-04 DIAGNOSIS — R29898 Other symptoms and signs involving the musculoskeletal system: Secondary | ICD-10-CM | POA: Diagnosis not present

## 2023-05-04 DIAGNOSIS — R532 Functional quadriplegia: Secondary | ICD-10-CM | POA: Diagnosis not present

## 2023-05-04 MED ORDER — ACETAMINOPHEN 650 MG RE SUPP: 650.0000 mg | Freq: Four times a day (QID) | RECTAL | Status: DC | PRN

## 2023-05-04 MED ORDER — METFORMIN HCL 1000 MG PO TABS: 1000.0000 mg | ORAL_TABLET | Freq: Two times a day (BID) | ORAL | Status: DC

## 2023-05-04 MED ORDER — POLYETHYLENE GLYCOL 3350 17 G PO PACK: 17.0000 g | PACK | Freq: Every day | ORAL | Status: DC

## 2023-05-04 MED ORDER — ARFORMOTEROL TARTRATE 15 MCG/2ML IN NEBU: 15.0000 ug | INHALATION_SOLUTION | Freq: Two times a day (BID) | RESPIRATORY_TRACT | Status: DC

## 2023-05-04 MED ORDER — GUAIFENESIN 100 MG/5ML PO LIQD: 5.0000 mL | Freq: Four times a day (QID) | ORAL | Status: DC | PRN

## 2023-05-04 MED ORDER — ACETAMINOPHEN 325 MG PO TABS: 650.0000 mg | ORAL_TABLET | Freq: Four times a day (QID) | ORAL | Status: DC | PRN

## 2023-05-04 MED ORDER — MELATONIN 3 MG PO TABS: 3.0000 mg | ORAL_TABLET | Freq: Every day | ORAL | Status: DC

## 2023-05-04 MED ORDER — ENSURE ENLIVE PO LIQD: 237.0000 mL | Freq: Two times a day (BID) | ORAL | Status: DC

## 2023-05-04 MED ORDER — GLYCOPYRROLATE 1 MG PO TABS: 1.0000 mg | ORAL_TABLET | Freq: Two times a day (BID) | ORAL | Status: DC | PRN

## 2023-05-04 MED ORDER — REVEFENACIN 175 MCG/3ML IN SOLN: 175.0000 ug | Freq: Every day | RESPIRATORY_TRACT | Status: DC

## 2023-05-04 NOTE — Consult Note (Signed)
Value-Based Care Institute  [facility] Inpatient Consult   05/04/2023  [name] 1940/07/23 [id number]  [facility] [[facility]]  Accountable Care Organization [ACO] Patient: [name]   Primary Care Provider:  Blane Ohara, MD With [facility]  Patient was reviewed for post hospital disposition to SNF for rehab.   Patient was screened for hospitalization and on behalf of [facility] Care Institute /[facility] Care Coordination to assess for post hospital community care needs.  Patient is being considered for a skilled nursing facility level of care for post hospital transition.  Patient is currently for [facility] which is a [facility] affiliated facility.   Plan:   Will notify the Community [facility] RN can follow for any known or needs for transitional care needs for returning to post facility care coordination needs to return to community.  For questions or referrals, please contact:   Charlesetta Shanks, RN, BSN, CCM [place]  [facility] [employer] Liaison Direct Dial: [phone] or secure chat Website: [email or web address]

## 2023-05-04 NOTE — Progress Notes (Signed)
Report called to [facility] spoke to nurse Iran Planas.  This RN also informed Misty Stanley, patient's daughter of his transfer to facility, but no eta as of now.

## 2023-05-04 NOTE — Discharge Summary (Addendum)
Family Medicine Teaching [facility] Discharge Summary  Patient name: [name] Medical record number: [id number] Date of birth: [birth year] Age: 82 y.o. Gender: male Date of Admission: 04/25/2023  Date of Discharge: 05/04/23 Admitting Physician: Lorayne Bender, MD  Primary Care Provider: Blane Ohara, MD Consultants: Palliative  Indication for Hospitalization: acute hypoxic respiratory failure 2/2 COPD and CHF exacerbation  Brief Hospital Course:  [name] is a 82 y.o.male with a history of HFrEF (25-30%), COPD, Hx recurrent falls, Hx ITP, Cognitive decline, HTN, T2DM, Weakness, CAD s/p CABG x4 who was admitted to the [facility] Medicine Teaching Service at [facility] for dyspnea 2/2 acute HFpEF exacerbation and COPD exacerbation. His hospital course is detailed below:  Acute hypoxic respiratory failure  acute HFrEF exacerbation  Presented dyspneic with productive cough. CXR showed small pleural effusions, no new cardiopulmonary abnormality. Repeat echocardiogram showed 20-25% LVEF. BNP 1879. Treated with IV Lasix 80 mg with brisk diuresis then transitioned to oral Lasix 60 mg BID then back to home dose of 40 mg BID prior to discharge. Heart failure team was consulted and recommended continued treatment with digoxin and metoprolol.  GDMT in the outpatient setting not recommended given intolerance with hypotension, recurrent falls, frailty. Pt clinically improved with decreased edema, SOB and wheezing upon discharge and sating well. Euvolemic. Pt also had suspected MAT on telemetry, managed medically with metoprolol.   COPD exacerbation Given increased sputum, purulence, dyspnea treated for presumed exacerbation with p.o. azithromycin 500 mg x 3 days, p.o. prednisone 40 mg x 3 days however only given 2 day course due to hyperglycemia. Started Anoro Ellipta inhaler then transitioned to revefenacin and Brovana nebulizer. On O2 [place] for comfort measures. Pt spiked a one time fever on 10/19,  cough throughout stay, diminished RLL sounds, and "choking" on food so repeat CXR obtained to evaluate for aspiration which showed findings consistent with CHF exacerbation but no pneumonia.  Frailty Progressive decline over the past few months with increased falls at home. Weakness especially a week prior to admission.  Worked with PT/OT who recommended OT 1x/week and PT 3x/week minimum. Family aware of poor prognosis [name] index 10: 64% 1 year mortality). He was discharged to SNF for further rehab.  Goals of care Previously followed outpatient with palliative medicine. Daughter, [name] [name]), agreed to consulting palliative care while inpatient. Family is hopeful for recovery at SNF. They understand poor prognosis, and are considering LTC facility vs home hospice if he does not improve well at SNF. Remains DNR/DNI.  Other chronic conditions were medically managed with home medications and formulary alternatives as necessary. Pt transitioned from metformin 500 mg TID to BID then 1000 mg BID. Pt was temporarily on SSI for elevated glucose and prednisone discontinued.   PCP Follow-up Recommendations: Do not restart Vericiguat Discontinued CBG checks in hospital as we resumed his home oral Metformin regimen. Can consider resuming glucose checks outpatient if desired. Recheck CBC for anemia as desired  Discharge Diagnoses/Problem List:  Frailty CHF COPD Malnutrition T2DM Frequent falls Normocytic anemia HLD CAD s/p CABGx4 MAT/PVC/sinus tachycardia BPH Afib (history in 2023)  Disposition: SNF then LTAC vs home hospice  Discharge Condition: Stable  Discharge Exam:  General: NAD, lying in hospital bed Neuro: not oriented to place Cardiovascular: +tachycardia, regular rhythm, grade III apical systolic murmur, RP palpable Respiratory: no respiratory distress, on O2 2 L [place], no wheezing or rales, +wet cough with no sputum production and limited force in coughing, +congestion Abdomen:  soft, NTTP, no rebound or guarding  Extremities: trace pitting peripheral edema, warm well perfused  Significant Labs and Imaging:  Labs/Studies: WBC 12.0, K+  5.0, BNP 1878, Hb 9.7 RVP negative  CXR: Consistent with CHF, no pneumonia Echo: LVEF 20 to 25%, global hypokinesis, moderately elevated PA pressure, mod to severe MV regurg, dilated IVC with RAP 15 mmHg   Discharge Medications:  Allergies as of 05/04/2023       Reactions   Lipitor [atorvastatin Calcium] Other (See Comments)   Causes memory loss   Crestor [rosuvastatin Calcium] Other (See Comments)   Causes Muscle Pain   Zocor [simvastatin] Other (See Comments)   Causes muscle pain        Medication List     STOP taking these medications    BD Pen Needle Nano U/F 32G X 4 MM Misc Generic drug: Insulin Pen Needle   benzonatate 200 MG capsule Commonly known as: TESSALON   ezetimibe 10 MG tablet Commonly known as: ZETIA   guaiFENesin 600 MG 12 hr tablet Commonly known as: Mucinex Replaced by: guaiFENesin 100 MG/5ML liquid   Lancets Misc   ofloxacin 0.3 % ophthalmic solution Commonly known as: Ocuflox   OneTouch Ultra test strip Generic drug: glucose blood   Safetussin DM Cough/Chest Cong 10-100 MG/5ML liquid Generic drug: dextromethorphan-guaiFENesin   Verquvo 5 MG Tabs Generic drug: Vericiguat       TAKE these medications    acetaminophen 325 MG tablet Commonly known as: TYLENOL Take 2 tablets (650 mg total) by mouth every 6 (six) hours as needed for mild pain (pain score 1-3) (or Fever >/= 101).   acetaminophen 650 MG suppository Commonly known as: TYLENOL Place 1 suppository (650 mg total) rectally every 6 (six) hours as needed for mild pain (pain score 1-3) (or Fever >/= 101).   arformoterol 15 MCG/2ML Nebu Commonly known as: BROVANA Take 2 mLs (15 mcg total) by nebulization 2 (two) times daily.   cholecalciferol 25 MCG (1000 UNIT) tablet Commonly known as: VITAMIN D3 Take 5,000 Units by  mouth daily.   clopidogrel 75 MG tablet Commonly known as: PLAVIX Take 1 tablet (75 mg total) by mouth daily. Please keep scheduled appointment for future refills. Thank you. What changed: additional instructions   digoxin 0.125 MG tablet Commonly known as: LANOXIN Take 1 tablet (0.125 mg total) by mouth daily.   feeding supplement Liqd Take 237 mLs by mouth 2 (two) times daily between meals.   furosemide 40 MG tablet Commonly known as: LASIX One in am and one before lunch.   glycopyrrolate 1 MG tablet Commonly known as: ROBINUL Take 1 tablet (1 mg total) by mouth 2 (two) times daily as needed.   guaiFENesin 100 MG/5ML liquid Commonly known as: ROBITUSSIN Take 5 mLs by mouth every 6 (six) hours as needed for cough or to loosen phlegm. Replaces: guaiFENesin 600 MG 12 hr tablet   melatonin 3 MG Tabs tablet Take 1 tablet (3 mg total) by mouth at bedtime.   metFORMIN 1000 MG tablet Commonly known as: GLUCOPHAGE Take 1 tablet (1,000 mg total) by mouth 2 (two) times daily with a meal. What changed:  medication strength how much to take when to take this   metoprolol succinate 25 MG 24 hr tablet Commonly known as: TOPROL-XL Take 0.5 tablets (12.5 mg total) by mouth at bedtime.   multivitamin tablet Take 1 tablet by mouth daily.   nitroGLYCERIN 0.4 MG SL tablet Commonly known as: NITROSTAT Place 1 tablet (0.4 mg total) under the tongue every 5 (five)  minutes as needed for chest pain.   Pataday 0.1 % ophthalmic solution Generic drug: olopatadine Place 1 drop into both eyes 2 (two) times daily.   polyethylene glycol 17 g packet Commonly known as: MIRALAX / GLYCOLAX Take 17 g by mouth daily.   potassium chloride SA 20 MEQ tablet Commonly known as: KLOR-CON M Take 1 tablet (20 mEq total) by mouth every Monday, Wednesday, and Friday. What changed:  how much to take when to take this   revefenacin 175 MCG/3ML nebulizer solution Commonly known as: YUPELRI Take 3 mLs  (175 mcg total) by nebulization daily.        Discharge Instructions: Please refer to Patient Instructions section of EMR for full details.  Patient was counseled important signs and symptoms that should prompt return to medical care, changes in medications, dietary instructions, activity restrictions, and follow up appointments.   Follow-Up Appointments:  Contact information for after-discharge care     Destination     [facility] [place] Preferred SNF .   Service: Skilled Nursing Contact information: [address] [place] [address] [phone]                     Ronney Lion, Medical Student 05/04/2023, 11:15 AM [facility]   I was personally present and performed or re-performed the history, physical exam and medical decision making activities of this service and have verified that the service and findings are accurately documented in the student's note.  Janeal Holmes, MD                  05/04/2023, 12:15 PM

## 2023-05-04 NOTE — TOC Transition Note (Signed)
Transition of Care [facility]) - CM/SW Discharge Note   Patient Details  Name: [name] MRN: [id number] Date of Birth: 1940/11/08  Transition [facility] Care [facility]) CM/SW Contact:  Deatra Robinson, [place] Phone Number: 05/04/2023, 12:40 PM   Clinical Narrative: pt for dc to [facility] today. Spoke to [place] in admissions who confirmed they are prepared to admit pt to room 808B. Pt's dtr [name] aware of dc and reports agreeable. RN provided with number for report and [facility] arranged for transport. SW signing off at dc.   Dellie Burns, MSW, LCSW [phone] (coverage)        Final next level of care: Skilled Nursing Facility Barriers to Discharge: Barriers Resolved   Patient Goals and CMS Choice      Discharge Placement                Patient chooses bed at: [facility] Patient to be transferred to facility by: [facility] Name of family member notified: Kim/Dtr Patient and family notified of of transfer: 05/04/23  Discharge Plan and Services Additional resources added to the After Visit Summary for                                       Social Determinants of Health (SDOH) Interventions SDOH Screenings   Food Insecurity: No Food Insecurity (04/25/2023)  Housing: Low Risk  (04/25/2023)  Transportation Needs: No Transportation Needs (04/25/2023)  Utilities: Not At Risk (04/25/2023)  Alcohol Screen: Low Risk  (01/26/2022)  Depression (PHQ2-9): Low Risk  (02/17/2023)  Financial Resource Strain: Low Risk  (01/26/2022)  Physical Activity: Sufficiently Active (08/19/2022)  Social Connections: Moderately Isolated (08/19/2022)  Stress: No Stress Concern Present (08/19/2022)  Tobacco Use: Low Risk  (04/25/2023)  Health Literacy: Adequate Health Literacy (02/17/2023)     Readmission Risk Interventions     No data to display

## 2023-05-07 DIAGNOSIS — I5041 Acute combined systolic (congestive) and diastolic (congestive) heart failure: Secondary | ICD-10-CM | POA: Diagnosis not present

## 2023-05-07 DIAGNOSIS — E0851 Diabetes mellitus due to underlying condition with diabetic peripheral angiopathy without gangrene: Secondary | ICD-10-CM | POA: Diagnosis not present

## 2023-05-07 DIAGNOSIS — E0842 Diabetes mellitus due to underlying condition with diabetic polyneuropathy: Secondary | ICD-10-CM | POA: Diagnosis not present

## 2023-05-07 DIAGNOSIS — R29898 Other symptoms and signs involving the musculoskeletal system: Secondary | ICD-10-CM | POA: Diagnosis not present

## 2023-05-07 DIAGNOSIS — R54 Age-related physical debility: Secondary | ICD-10-CM | POA: Diagnosis not present

## 2023-05-07 DIAGNOSIS — N401 Enlarged prostate with lower urinary tract symptoms: Secondary | ICD-10-CM | POA: Diagnosis not present

## 2023-05-07 DIAGNOSIS — Z9181 History of falling: Secondary | ICD-10-CM | POA: Diagnosis not present

## 2023-05-07 DIAGNOSIS — J9601 Acute respiratory failure with hypoxia: Secondary | ICD-10-CM | POA: Diagnosis not present

## 2023-05-07 DIAGNOSIS — R532 Functional quadriplegia: Secondary | ICD-10-CM | POA: Diagnosis not present

## 2023-05-07 DIAGNOSIS — I25118 Atherosclerotic heart disease of native coronary artery with other forms of angina pectoris: Secondary | ICD-10-CM | POA: Diagnosis not present

## 2023-05-14 DEATH — deceased

## 2023-05-20 ENCOUNTER — Other Ambulatory Visit (HOSPITAL_COMMUNITY): Payer: Self-pay | Admitting: Cardiology

## 2023-05-21 ENCOUNTER — Ambulatory Visit: Payer: Medicare HMO | Admitting: Cardiovascular Disease

## 2023-06-17 ENCOUNTER — Ambulatory Visit: Payer: Medicare HMO | Admitting: Family Medicine

## 2023-06-30 ENCOUNTER — Encounter (HOSPITAL_COMMUNITY): Payer: Medicare HMO | Admitting: Cardiology

## 2023-07-01 ENCOUNTER — Ambulatory Visit: Payer: Medicare HMO | Admitting: Podiatry

## 2023-07-23 ENCOUNTER — Other Ambulatory Visit: Payer: Medicare HMO

## 2023-07-23 ENCOUNTER — Ambulatory Visit: Payer: Medicare HMO | Admitting: Internal Medicine
